# Patient Record
Sex: Female | Born: 1950 | Race: Black or African American | Hispanic: No | Marital: Single | Smoking: Former smoker
Health system: Southern US, Community
[De-identification: ages and names within clinical notes are randomized; demographics above are authoritative.]

## PROBLEM LIST (undated history)

## (undated) DIAGNOSIS — F32A Depression, unspecified: Secondary | ICD-10-CM

## (undated) DIAGNOSIS — Z9221 Personal history of antineoplastic chemotherapy: Secondary | ICD-10-CM

## (undated) DIAGNOSIS — I10 Essential (primary) hypertension: Secondary | ICD-10-CM

## (undated) DIAGNOSIS — IMO0001 Reserved for inherently not codable concepts without codable children: Secondary | ICD-10-CM

## (undated) DIAGNOSIS — K219 Gastro-esophageal reflux disease without esophagitis: Secondary | ICD-10-CM

## (undated) DIAGNOSIS — C50411 Malignant neoplasm of upper-outer quadrant of right female breast: Secondary | ICD-10-CM

## (undated) DIAGNOSIS — F329 Major depressive disorder, single episode, unspecified: Secondary | ICD-10-CM

## (undated) DIAGNOSIS — M1990 Unspecified osteoarthritis, unspecified site: Secondary | ICD-10-CM

## (undated) DIAGNOSIS — F419 Anxiety disorder, unspecified: Secondary | ICD-10-CM

## (undated) DIAGNOSIS — IMO0002 Reserved for concepts with insufficient information to code with codable children: Secondary | ICD-10-CM

## (undated) DIAGNOSIS — Z923 Personal history of irradiation: Secondary | ICD-10-CM

## (undated) DIAGNOSIS — M24319 Pathological dislocation of unspecified shoulder, not elsewhere classified: Secondary | ICD-10-CM

## (undated) DIAGNOSIS — Z8719 Personal history of other diseases of the digestive system: Secondary | ICD-10-CM

## (undated) DIAGNOSIS — C50919 Malignant neoplasm of unspecified site of unspecified female breast: Secondary | ICD-10-CM

## (undated) HISTORY — DX: Essential (primary) hypertension: I10

## (undated) HISTORY — DX: Malignant neoplasm of upper-outer quadrant of right female breast: C50.411

## (undated) HISTORY — PX: MULTIPLE TOOTH EXTRACTIONS: SHX2053

## (undated) HISTORY — DX: Reserved for inherently not codable concepts without codable children: IMO0001

## (undated) HISTORY — DX: Anxiety disorder, unspecified: F41.9

## (undated) HISTORY — DX: Reserved for concepts with insufficient information to code with codable children: IMO0002

## (undated) HISTORY — PX: WISDOM TOOTH EXTRACTION: SHX21

## (undated) HISTORY — DX: Depression, unspecified: F32.A

## (undated) HISTORY — PX: TONSILLECTOMY: SUR1361

## (undated) HISTORY — DX: Major depressive disorder, single episode, unspecified: F32.9

---

## 1988-11-29 HISTORY — PX: ABDOMINAL HYSTERECTOMY: SHX81

## 2001-07-03 ENCOUNTER — Emergency Department (HOSPITAL_COMMUNITY): Admission: EM | Admit: 2001-07-03 | Discharge: 2001-07-03 | Payer: Self-pay | Admitting: Emergency Medicine

## 2001-07-03 ENCOUNTER — Encounter: Payer: Self-pay | Admitting: Emergency Medicine

## 2002-02-23 ENCOUNTER — Emergency Department (HOSPITAL_COMMUNITY): Admission: EM | Admit: 2002-02-23 | Discharge: 2002-02-23 | Payer: Self-pay | Admitting: Emergency Medicine

## 2003-06-01 ENCOUNTER — Emergency Department (HOSPITAL_COMMUNITY): Admission: AD | Admit: 2003-06-01 | Discharge: 2003-06-01 | Payer: Self-pay | Admitting: Family Medicine

## 2007-07-03 ENCOUNTER — Emergency Department (HOSPITAL_COMMUNITY): Admission: EM | Admit: 2007-07-03 | Discharge: 2007-07-03 | Payer: Self-pay | Admitting: Family Medicine

## 2007-08-08 ENCOUNTER — Emergency Department (HOSPITAL_COMMUNITY): Admission: EM | Admit: 2007-08-08 | Discharge: 2007-08-08 | Payer: Self-pay | Admitting: Family Medicine

## 2007-08-27 ENCOUNTER — Emergency Department (HOSPITAL_COMMUNITY): Admission: EM | Admit: 2007-08-27 | Discharge: 2007-08-27 | Payer: Self-pay | Admitting: Family Medicine

## 2008-08-06 ENCOUNTER — Emergency Department (HOSPITAL_COMMUNITY): Admission: EM | Admit: 2008-08-06 | Discharge: 2008-08-06 | Payer: Self-pay | Admitting: Emergency Medicine

## 2013-02-02 ENCOUNTER — Other Ambulatory Visit: Payer: Self-pay

## 2013-02-28 HISTORY — PX: BREAST BIOPSY: SHX20

## 2013-03-02 ENCOUNTER — Encounter: Payer: Self-pay | Admitting: Obstetrics & Gynecology

## 2013-03-02 ENCOUNTER — Other Ambulatory Visit: Payer: Self-pay | Admitting: Obstetrics & Gynecology

## 2013-03-02 ENCOUNTER — Ambulatory Visit (INDEPENDENT_AMBULATORY_CARE_PROVIDER_SITE_OTHER): Payer: 59 | Admitting: Obstetrics & Gynecology

## 2013-03-02 VITALS — BP 157/66 | HR 69 | Temp 98.1°F | Ht 60.0 in | Wt 220.0 lb

## 2013-03-02 DIAGNOSIS — N63 Unspecified lump in unspecified breast: Secondary | ICD-10-CM

## 2013-03-02 DIAGNOSIS — Z0000 Encounter for general adult medical examination without abnormal findings: Secondary | ICD-10-CM

## 2013-03-02 DIAGNOSIS — Z01419 Encounter for gynecological examination (general) (routine) without abnormal findings: Secondary | ICD-10-CM

## 2013-03-02 DIAGNOSIS — R234 Changes in skin texture: Secondary | ICD-10-CM

## 2013-03-02 LAB — COMPREHENSIVE METABOLIC PANEL
ALT: 25 U/L (ref 0–35)
AST: 18 U/L (ref 0–37)
Albumin: 4.2 g/dL (ref 3.5–5.2)
Alkaline Phosphatase: 81 U/L (ref 39–117)
BUN: 11 mg/dL (ref 6–23)
CO2: 27 mEq/L (ref 19–32)
Calcium: 9.4 mg/dL (ref 8.4–10.5)
Chloride: 99 mEq/L (ref 96–112)
Creat: 0.75 mg/dL (ref 0.50–1.10)
Glucose, Bld: 81 mg/dL (ref 70–99)
Potassium: 3.9 mEq/L (ref 3.5–5.3)
Sodium: 141 mEq/L (ref 135–145)
Total Bilirubin: 0.4 mg/dL (ref 0.3–1.2)
Total Protein: 8.5 g/dL — ABNORMAL HIGH (ref 6.0–8.3)

## 2013-03-02 LAB — POCT URINALYSIS DIPSTICK
Bilirubin, UA: NEGATIVE
Glucose, UA: NEGATIVE
Ketones, UA: NEGATIVE
Nitrite, UA: NEGATIVE
Protein, UA: NEGATIVE
Spec Grav, UA: <=1.005
Urobilinogen, UA: NEGATIVE
pH, UA: 8

## 2013-03-02 LAB — CBC
HCT: 43.5 % (ref 36.0–46.0)
Hemoglobin: 14.6 g/dL (ref 12.0–15.0)
MCH: 28 pg (ref 26.0–34.0)
MCHC: 33.6 g/dL (ref 30.0–36.0)
MCV: 83.3 fL (ref 78.0–100.0)
Platelets: 379 10*3/uL (ref 150–400)
RBC: 5.22 MIL/uL — ABNORMAL HIGH (ref 3.87–5.11)
RDW: 15.8 % — ABNORMAL HIGH (ref 11.5–15.5)
WBC: 7.6 10*3/uL (ref 4.0–10.5)

## 2013-03-02 LAB — HDL CHOLESTEROL: HDL: 56 mg/dL (ref >39)

## 2013-03-02 LAB — CHOLESTEROL, TOTAL: Cholesterol: 236 mg/dL — ABNORMAL HIGH (ref 0–200)

## 2013-03-02 LAB — TSH: TSH: 2.232 u[IU]/mL (ref 0.350–4.500)

## 2013-03-02 NOTE — Progress Notes (Signed)
Subjective:     Denise Becker is a 62 y.o. female here for a routine exam.  Current complaints: itching to chest area, would like to schedule a mammogram.  Personal health questionnaire reviewed: yes.   Gynecologic History No LMP recorded. Patient has had a hysterectomy. Contraception: status post hysterectomy Last Pap: 15 - 20 years ago. Last mammogram: Never had.  Obstetric History OB History  No data available     The following portions of the patient's history were reviewed and updated as appropriate: allergies, current medications, past family history, past medical history, past social history, past surgical history and problem list.  Review of Systems Pertinent items are noted in HPI.    Objective:    General appearance: alert Breasts: left breast with inverted nipple; no masses, no discharge Abdomen: soft, non-tender; bowel sounds normal; no masses,  no organomegaly Pelvic:  external genitalia normal, no adnexal masses or tenderness, and vagina normal without discharge    Assessment:   Possible breast mass   Plan:   Orders Placed This Encounter  Procedures  . Hemoglobin A1c  . TSH  . CBC  . Comprehensive metabolic panel  . Cholesterol, total  . HDL cholesterol  . POCT Urinalysis Dipstick  Referral-->breast imaging, primary care provider Follow-up prn

## 2013-03-02 NOTE — Patient Instructions (Signed)
Health Maintenance, Female A healthy lifestyle and preventative care can promote health and wellness.  Maintain regular health, dental, and eye exams.  Eat a healthy diet. Foods like vegetables, fruits, whole grains, low-fat dairy products, and lean protein foods contain the nutrients you need without too many calories. Decrease your intake of foods high in solid fats, added sugars, and salt. Get information about a proper diet from your caregiver, if necessary.  Regular physical exercise is one of the most important things you can do for your health. Most adults should get at least 150 minutes of moderate-intensity exercise (any activity that increases your heart rate and causes you to sweat) each week. In addition, most adults need muscle-strengthening exercises on 2 or more days a week.   Maintain a healthy weight. The body mass index (BMI) is a screening tool to identify possible weight problems. It provides an estimate of body fat based on height and weight. Your caregiver can help determine your BMI, and can help you achieve or maintain a healthy weight. For adults 20 years and older:  A BMI below 18.5 is considered underweight.  A BMI of 18.5 to 24.9 is normal.  A BMI of 25 to 29.9 is considered overweight.  A BMI of 30 and above is considered obese.  Maintain normal blood lipids and cholesterol by exercising and minimizing your intake of saturated fat. Eat a balanced diet with plenty of fruits and vegetables. Blood tests for lipids and cholesterol should begin at age 26 and be repeated every 5 years. If your lipid or cholesterol levels are high, you are over 50, or you are a high risk for heart disease, you may need your cholesterol levels checked more frequently.Ongoing high lipid and cholesterol levels should be treated with medicines if diet and exercise are not effective.  If you smoke, find out from your caregiver how to quit. If you do not use tobacco, do not start.  Lung  cancer screening is recommended for adults aged 63 80 years who are at high risk for developing lung cancer because of a history of smoking. Yearly low-dose computed tomography (CT) is recommended for people who have at least a 30-pack-year history of smoking and are a current smoker or have quit within the past 15 years. A pack year of smoking is smoking an average of 1 pack of cigarettes a day for 1 year (for example: 1 pack a day for 30 years or 2 packs a day for 15 years). Yearly screening should continue until the smoker has stopped smoking for at least 15 years. Yearly screening should also be stopped for people who develop a health problem that would prevent them from having lung cancer treatment.  If you are pregnant, do not drink alcohol. If you are breastfeeding, be very cautious about drinking alcohol. If you are not pregnant and choose to drink alcohol, do not exceed 1 drink per day. One drink is considered to be 12 ounces (355 mL) of beer, 5 ounces (148 mL) of wine, or 1.5 ounces (44 mL) of liquor.  Avoid use of street drugs. Do not share needles with anyone. Ask for help if you need support or instructions about stopping the use of drugs.  High blood pressure causes heart disease and increases the risk of stroke. Blood pressure should be checked at least every 1 to 2 years. Ongoing high blood pressure should be treated with medicines, if weight loss and exercise are not effective.  If you are 55 to  63 years old, ask your caregiver if you should take aspirin to prevent strokes.  Diabetes screening involves taking a blood sample to check your fasting blood sugar level. This should be done once every 3 years, after age 45, if you are within normal weight and without risk factors for diabetes. Testing should be considered at a younger age or be carried out more frequently if you are overweight and have at least 1 risk factor for diabetes.  Breast cancer screening is essential preventative care  for women. You should practice "breast self-awareness." This means understanding the normal appearance and feel of your breasts and may include breast self-examination. Any changes detected, no matter how small, should be reported to a caregiver. Women in their 20s and 30s should have a clinical breast exam (CBE) by a caregiver as part of a regular health exam every 1 to 3 years. After age 40, women should have a CBE every year. Starting at age 40, women should consider having a mammogram (breast X-ray) every year. Women who have a family history of breast cancer should talk to their caregiver about genetic screening. Women at a high risk of breast cancer should talk to their caregiver about having an MRI and a mammogram every year.  Breast cancer gene (BRCA)-related cancer risk assessment is recommended for women who have family members with BRCA-related cancers. BRCA-related cancers include breast, ovarian, tubal, and peritoneal cancers. Having family members with these cancers may be associated with an increased risk for harmful changes (mutations) in the breast cancer genes BRCA1 and BRCA2. Results of the assessment will determine the need for genetic counseling and BRCA1 and BRCA2 testing.  The Pap test is a screening test for cervical cancer. Women should have a Pap test starting at age 21. Between ages 21 and 29, Pap tests should be repeated every 2 years. Beginning at age 30, you should have a Pap test every 3 years as long as the past 3 Pap tests have been normal. If you had a hysterectomy for a problem that was not cancer or a condition that could lead to cancer, then you no longer need Pap tests. If you are between ages 65 and 70, and you have had normal Pap tests going back 10 years, you no longer need Pap tests. If you have had past treatment for cervical cancer or a condition that could lead to cancer, you need Pap tests and screening for cancer for at least 20 years after your treatment. If Pap  tests have been discontinued, risk factors (such as a new sexual partner) need to be reassessed to determine if screening should be resumed. Some women have medical problems that increase the chance of getting cervical cancer. In these cases, your caregiver may recommend more frequent screening and Pap tests.  The human papillomavirus (HPV) test is an additional test that may be used for cervical cancer screening. The HPV test looks for the virus that can cause the cell changes on the cervix. The cells collected during the Pap test can be tested for HPV. The HPV test could be used to screen women aged 30 years and older, and should be used in women of any age who have unclear Pap test results. After the age of 30, women should have HPV testing at the same frequency as a Pap test.  Colorectal cancer can be detected and often prevented. Most routine colorectal cancer screening begins at the age of 50 and continues through age 75. However, your caregiver   may recommend screening at an earlier age if you have risk factors for colon cancer. On a yearly basis, your caregiver may provide home test kits to check for hidden blood in the stool. Use of a small camera at the end of a tube, to directly examine the colon (sigmoidoscopy or colonoscopy), can detect the earliest forms of colorectal cancer. Talk to your caregiver about this at age 23, when routine screening begins. Direct examination of the colon should be repeated every 5 to 10 years through age 81, unless early forms of pre-cancerous polyps or small growths are found.  Hepatitis C blood testing is recommended for all people born from [birth year] through 1966 and any individual with known risks for hepatitis C.  Practice safe sex. Use condoms and avoid high-risk sexual practices to reduce the spread of sexually transmitted infections (STIs). Sexually active women aged 17 and younger should be checked for Chlamydia, which is a common sexually transmitted infection.  Older women with new or multiple partners should also be tested for Chlamydia. Testing for other STIs is recommended if you are sexually active and at increased risk.  Osteoporosis is a disease in which the bones lose minerals and strength with aging. This can result in serious bone fractures. The risk of osteoporosis can be identified using a bone density scan. Women ages 45 and over and women at risk for fractures or osteoporosis should discuss screening with their caregivers. Ask your caregiver whether you should be taking a calcium supplement or vitamin D to reduce the rate of osteoporosis.  Menopause can be associated with physical symptoms and risks. Hormone replacement therapy is available to decrease symptoms and risks. You should talk to your caregiver about whether hormone replacement therapy is right for you.  Use sunscreen. Apply sunscreen liberally and repeatedly throughout the day. You should seek shade when your shadow is shorter than you. Protect yourself by wearing long sleeves, pants, a wide-brimmed hat, and sunglasses year round, whenever you are outdoors.  Notify your caregiver of new moles or changes in moles, especially if there is a change in shape or color. Also notify your caregiver if a mole is larger than the size of a pencil eraser.  Stay current with your immunizations. Document Released: 09/30/2010 Document Revised: 07/12/2012 Document Reviewed: 09/30/2010 [facility] Patient Information 2014 [place], [place]. Mammography Mammography is an X-ray of the breasts to look for changes that are not normal. The X-ray image is called a mammogram. This procedure can screen for breast cancer, can detect cancer early, and can diagnose cancer.  LET YOUR CAREGIVER KNOW ABOUT:  Breast implants.  Previous breast disease, biopsy, or surgery.  If you are breastfeeding.  Medicines taken, including vitamins, herbs, eyedrops, over-the-counter medicines, and creams.  Use of steroids (by  mouth or creams).  Possibility of pregnancy, if this applies. RISKS AND COMPLICATIONS  Exposure to radiation, but at very low levels.  The results may be misinterpreted.  The results may not be accurate.  Mammography may lead to further tests.  Mammography may not catch certain cancers. BEFORE THE PROCEDURE  Schedule your test about 7 days after your menstrual period. This is when your breasts are the least tender and have signs of hormone changes.  If you have had a mammography done at a different facility in the past, get the mammogram X-rays or have them sent to your current exam facility in order to compare them.  Wash your breasts and under your arms the day of the test.  Do not wear deodorants, perfumes, or powders anywhere on your body.  Wear clothes that you can change in and out of easily. PROCEDURE Relax as much as possible during the test. Any discomfort during the test will be very brief. The test should take less than 30 minutes. The following will happen:  You will undress from the waist up and put on a gown.  You will stand in front of the X-ray machine.  Each breast will be placed between 2 plastic or glass plates. The plates will compress your breast for a few seconds.  X-rays will be taken from different angles of the breast. AFTER THE PROCEDURE  The mammogram will be examined.  Depending on the quality of the images, you may need to repeat certain parts of the test.  Ask when your test results will be ready. Make sure you get your test results.  You may resume normal activities. Document Released: 03/14/2000 Document Revised: 06/09/2011 Document Reviewed: 01/05/2011 [facility] Patient Information 2014 [place], [place].

## 2013-03-02 NOTE — Progress Notes (Signed)
   Subjective:    Patient ID: Denise Becker, female    DOB: October 27, 1950, 62 y.o.   MRN: [id number]  Gynecologic Exam      Review of Systems     Objective:   Physical Exam  Pulmonary/Chest:            Assessment & Plan:

## 2013-03-03 ENCOUNTER — Ambulatory Visit
Admission: RE | Admit: 2013-03-03 | Discharge: 2013-03-03 | Disposition: A | Discharge status: Home / Self Care | Payer: 59 | Source: Ambulatory Visit | Attending: Obstetrics & Gynecology | Admitting: Obstetrics & Gynecology

## 2013-03-03 ENCOUNTER — Other Ambulatory Visit: Payer: Self-pay | Admitting: Obstetrics & Gynecology

## 2013-03-03 DIAGNOSIS — N63 Unspecified lump in unspecified breast: Secondary | ICD-10-CM

## 2013-03-03 DIAGNOSIS — R234 Changes in skin texture: Secondary | ICD-10-CM

## 2013-03-03 LAB — HEMOGLOBIN A1C
Hgb A1c MFr Bld: 6.3 % — ABNORMAL HIGH (ref <5.7)
Mean Plasma Glucose: 134 mg/dL — ABNORMAL HIGH (ref <117)

## 2013-03-04 ENCOUNTER — Ambulatory Visit
Admission: RE | Admit: 2013-03-04 | Discharge: 2013-03-04 | Disposition: A | Discharge status: Home / Self Care | Payer: 59 | Source: Ambulatory Visit | Attending: Obstetrics & Gynecology | Admitting: Obstetrics & Gynecology

## 2013-03-04 ENCOUNTER — Other Ambulatory Visit: Payer: Self-pay | Admitting: Obstetrics & Gynecology

## 2013-03-04 DIAGNOSIS — R234 Changes in skin texture: Secondary | ICD-10-CM

## 2013-03-04 DIAGNOSIS — N63 Unspecified lump in unspecified breast: Secondary | ICD-10-CM

## 2013-03-07 ENCOUNTER — Encounter: Payer: Self-pay | Admitting: Obstetrics & Gynecology

## 2013-03-07 ENCOUNTER — Other Ambulatory Visit: Payer: Self-pay | Admitting: Obstetrics & Gynecology

## 2013-03-10 ENCOUNTER — Telehealth: Payer: Self-pay | Admitting: *Deleted

## 2013-03-10 ENCOUNTER — Encounter: Payer: Self-pay | Admitting: *Deleted

## 2013-03-10 DIAGNOSIS — C50411 Malignant neoplasm of upper-outer quadrant of right female breast: Secondary | ICD-10-CM

## 2013-03-10 HISTORY — DX: Malignant neoplasm of upper-outer quadrant of right female breast: C50.411

## 2013-03-10 NOTE — Telephone Encounter (Signed)
Confirmed [facility] for 03/16/13 at 0800 .  Instructions and contact information given. 

## 2013-03-11 ENCOUNTER — Ambulatory Visit
Admission: RE | Admit: 2013-03-11 | Discharge: 2013-03-11 | Disposition: A | Discharge status: Home / Self Care | Payer: 59 | Source: Ambulatory Visit | Attending: Obstetrics & Gynecology | Admitting: Obstetrics & Gynecology

## 2013-03-11 MED ORDER — GADOBENATE DIMEGLUMINE 529 MG/ML IV SOLN
20.0000 mL | Freq: Once | INTRAVENOUS | Status: AC | PRN
  Administered 2013-03-11: 20 mL via INTRAVENOUS

## 2013-03-14 ENCOUNTER — Other Ambulatory Visit: Payer: Self-pay | Admitting: Obstetrics & Gynecology

## 2013-03-16 ENCOUNTER — Encounter: Payer: Self-pay | Admitting: *Deleted

## 2013-03-16 ENCOUNTER — Other Ambulatory Visit (HOSPITAL_BASED_OUTPATIENT_CLINIC_OR_DEPARTMENT_OTHER): Payer: 59

## 2013-03-16 ENCOUNTER — Ambulatory Visit (HOSPITAL_BASED_OUTPATIENT_CLINIC_OR_DEPARTMENT_OTHER): Payer: 59 | Admitting: General Surgery

## 2013-03-16 ENCOUNTER — Encounter (INDEPENDENT_AMBULATORY_CARE_PROVIDER_SITE_OTHER): Payer: Self-pay | Admitting: General Surgery

## 2013-03-16 ENCOUNTER — Ambulatory Visit: Payer: 59 | Attending: General Surgery | Admitting: Physical Therapy

## 2013-03-16 ENCOUNTER — Ambulatory Visit: Payer: 59

## 2013-03-16 ENCOUNTER — Telehealth: Payer: Self-pay | Admitting: *Deleted

## 2013-03-16 ENCOUNTER — Encounter: Payer: Self-pay | Admitting: Oncology

## 2013-03-16 ENCOUNTER — Ambulatory Visit
Admission: RE | Admit: 2013-03-16 | Discharge: 2013-03-16 | Disposition: A | Discharge status: Home / Self Care | Payer: 59 | Source: Ambulatory Visit | Attending: Radiation Oncology | Admitting: Radiation Oncology

## 2013-03-16 ENCOUNTER — Ambulatory Visit (HOSPITAL_BASED_OUTPATIENT_CLINIC_OR_DEPARTMENT_OTHER): Payer: 59 | Admitting: Oncology

## 2013-03-16 ENCOUNTER — Telehealth: Payer: Self-pay | Admitting: Oncology

## 2013-03-16 VITALS — BP 172/83 | HR 84 | Temp 98.3°F | Resp 18 | Ht 60.0 in | Wt 214.4 lb

## 2013-03-16 DIAGNOSIS — C50411 Malignant neoplasm of upper-outer quadrant of right female breast: Secondary | ICD-10-CM

## 2013-03-16 DIAGNOSIS — C50919 Malignant neoplasm of unspecified site of unspecified female breast: Secondary | ICD-10-CM

## 2013-03-16 DIAGNOSIS — IMO0001 Reserved for inherently not codable concepts without codable children: Secondary | ICD-10-CM | POA: Insufficient documentation

## 2013-03-16 DIAGNOSIS — C50419 Malignant neoplasm of upper-outer quadrant of unspecified female breast: Secondary | ICD-10-CM

## 2013-03-16 DIAGNOSIS — I10 Essential (primary) hypertension: Secondary | ICD-10-CM

## 2013-03-16 DIAGNOSIS — F4323 Adjustment disorder with mixed anxiety and depressed mood: Secondary | ICD-10-CM

## 2013-03-16 DIAGNOSIS — R293 Abnormal posture: Secondary | ICD-10-CM | POA: Insufficient documentation

## 2013-03-16 LAB — COMPREHENSIVE METABOLIC PANEL (CC13)
AST: 15 U/L (ref 5–34)
Albumin: 3.6 g/dL (ref 3.5–5.0)
Alkaline Phosphatase: 80 U/L (ref 40–150)
BUN: 12 mg/dL (ref 7.0–26.0)
Calcium: 10 mg/dL (ref 8.4–10.4)
Chloride: 106 mEq/L (ref 98–109)
Glucose: 106 mg/dl (ref 70–140)
Potassium: 3.8 mEq/L (ref 3.5–5.1)
Sodium: 141 mEq/L (ref 136–145)
Total Protein: 8.4 g/dL — ABNORMAL HIGH (ref 6.4–8.3)

## 2013-03-16 LAB — CBC WITH DIFFERENTIAL/PLATELET
BASO%: 0.9 % (ref 0.0–2.0)
Basophils Absolute: 0.1 10*3/uL (ref 0.0–0.1)
Eosinophils Absolute: 0.2 10*3/uL (ref 0.0–0.5)
HGB: 14.6 g/dL (ref 11.6–15.9)
MCH: 27.8 pg (ref 25.1–34.0)
MONO%: 10.6 % (ref 0.0–14.0)
NEUT#: 3.4 10*3/uL (ref 1.5–6.5)
RBC: 5.25 10*6/uL (ref 3.70–5.45)
RDW: 14.9 % — ABNORMAL HIGH (ref 11.2–14.5)
WBC: 7.3 10*3/uL (ref 3.9–10.3)
lymph#: 2.9 10*3/uL (ref 0.9–3.3)

## 2013-03-16 NOTE — Progress Notes (Signed)
Radiation Oncology         [id number]) [phone] ________________________________  Initial outpatient Consultation - Date: 03/16/2013   Name: Denise Becker MRN: [id number]   DOB: November 06, 1950  REFERRING PHYSICIAN: Ernestene Mention, MD  DIAGNOSIS: T3N0 Right breast cancer  HISTORY OF PRESENT ILLNESS::Denise Becker is a 62 y.o. female  noted nipple retraction about 3 months ago. She presented to her primary care physician's office ordered a mammogram. The mammogram showed a 6 cm mass with nipple retraction. On ultrasound as noted to measure 6.7 cm with 2 enlarged axillary lymph nodes. An MRI of the bilateral breasts showed a 4.1 x 6.8 x 4.9 cm area with several satellite nodules. 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 This is ER positive at 100% PR positive at 100% Ki-67 20% and HER-2 negative. A biopsy of the axillary lymph node showed adequate lymphoid tissue with no evidence of malignancy. She presents to multidisciplinary clinic today unaccompanied. He is GX P1 with menses at 10 and her last period 15 years ago. First childbirth was at 89. She is not on hormone replacement.Marland Kitchen  PREVIOUS RADIATION THERAPY: No  PAST MEDICAL HISTORY:  has a past medical history of Anxiety; Depression; Breast cancer of upper-outer quadrant of right female breast (03/10/2013); and Hypertension.    PAST SURGICAL HISTORY: Past Surgical History  Procedure Laterality Date  . Abdominal hysterectomy    . Multiple tooth extractions Bilateral     FAMILY HISTORY:  Family History  Problem Relation Age of Onset  . Cancer Mother   . Hypertension Mother   . Bowel Disease Mother   . Lung disease Father   . Heart disease Maternal Grandmother   . Heart disease Maternal Grandfather     SOCIAL HISTORY:  History  Substance Use Topics  . Smoking status: Former Smoker -- 1.00 packs/day    Types: Cigarettes  . Smokeless tobacco: Never Used  . Alcohol Use: Yes     Comment:  occasional wine    ALLERGIES: Review of patient's allergies indicates no known allergies.  MEDICATIONS:  Current Outpatient Prescriptions  Medication Sig Dispense Refill  . Amlodipine-Olmesartan (AZOR PO) Take 1 tablet by mouth daily.      . Multiple Vitamin (MULTIVITAMIN) capsule Take 1 capsule by mouth daily.      . naproxen sodium (ANAPROX) 220 MG tablet Take 220 mg by mouth 2 (two) times daily as needed.       No current facility-administered medications for this encounter.    REVIEW OF SYSTEMS:  A 15 point review of systems is documented in the electronic medical record. This was obtained by the nursing staff. However, I reviewed this with the patient to discuss relevant findings and make appropriate changes.  Pertinent items are noted in HPI.  PHYSICAL EXAM: There were no vitals filed for this visit.. . Pleasant female who appears her stated age sitting comfortably on examining room chair. She has been nipple retraction of the right breast. She has a large palpable mass in the upper outer to lower outer quadrant. The margins are difficult to delineate. There is some skin dimpling in that area as well. No palpable abnormalities the left breast. No palpable left axillary lymph nodes. No palpable cervical or supraclavicular lymph nodes bilaterally. She is alert and oriented x3. She is 5 out of 5 strength bilaterally.  LABORATORY DATA:  Lab Results  Component Value Date   WBC 7.3 03/16/2013   HGB 14.6 03/16/2013  HCT 43.9 03/16/2013   MCV 83.7 03/16/2013   PLT 305 03/16/2013   Lab Results  Component Value Date   NA 141 03/16/2013   K 3.8 03/16/2013   CL 99 03/02/2013   CO2 26 03/16/2013   Lab Results  Component Value Date   ALT 21 03/16/2013   AST 15 03/16/2013   ALKPHOS 80 03/16/2013   BILITOT 0.37 03/16/2013     RADIOGRAPHY: US Breast Right  03/03/2013   CLINICAL DATA:  62 year old patient with palpable mass in the superior right breast and right nipple retraction.  This is her 1st mammogram.  EXAM: DIGITAL DIAGNOSTIC  BILATERAL MAMMOGRAM WITH CAD  ULTRASOUND RIGHT BREAST  COMPARISON:  None.  ACR Breast Density Category c: The breast tissue is heterogeneously dense, which may obscure small masses.  FINDINGS: There is a prominent irregular mass like area of added density in the upper central right breast that measures approximately 5 x 5 x 3 cm, with slight distortion and associated right nipple retraction. No skin thickening is appreciated on the right. No suspicious microcalcifications are identified in the right breast.  No mass, suspicious microcalcification, or distortion is identified on the left to suggest malignancy.  Mammographic images were processed with CAD.  On physical exam, there is a firm palpable mass in the superior right breast that extends from approximately 1 o'clock position to 9:30 position. The right nipple is completely retracted. No definite skin thickening is identified. No mass is palpated in the right axilla.  Ultrasound is performed, showing a prominent irregular hypoechoic mass that causes marked posterior acoustic shadowing. This mass is greater than discuss that this mass is greater in size than the ultrasound screen, and is best measured bile mammogram and physical examination. Ultrasound abnormality spans from the 1 o'clock position to the 9:30 position in the superior right breast.  Ultrasound of the right axilla shows 2 adjacent small lymph nodes, both of which have diffusely thickened cortices. The largest of these 2 lymph nodes measures 1.1 cm x 0.7 cm.  IMPRESSION: Findings highly suspicious for malignancy in the superior right breast. Two right axillary lymph nodes demonstrate diffuse cortical thickening suspicious for metastatic disease. Findings were discussed with the patient today, and biopsy of the right breast mass and one of the lymph nodes is suggested. It was offered to the patient to perform the biopsies today, but she would  prefer to return tomorrow. Biopsy has been scheduled for 03/04/2013.  RECOMMENDATION: Ultrasound-guided core needle biopsy of palpable right breast mass and a right axillary lymph node with diffuse cortical thickening.  I have discussed the findings and recommendations with the patient. Results were also provided in writing at the conclusion of the visit.  BI-RADS CATEGORY  5: Highly suggestive of malignancy - appropriate action should be taken.   Electronically Signed   By: Britta Mccreedy M.D.   On: 03/03/2013 14:58   Mr Breast Bilateral W Wo Contrast  03/11/2013   CLINICAL DATA:  62 year old female with recently diagnosed invasive ductal carcinoma of the right breast. A biopsied right axillary lymph node was negative for metastases.  EXAM: BILATERAL BREAST MRI WITH AND WITHOUT CONTRAST  LABS:  Most recent serum creatinine was 0.8 mg/dL on 40/98/1191.  TECHNIQUE: Multiplanar, multisequence MR images of both breasts were obtained prior to and following the intravenous administration of 20ml of MultiHance.  THREE-DIMENSIONAL MR IMAGE RENDERING ON INDEPENDENT WORKSTATION:  Three-dimensional MR images were rendered by post-processing of the original MR data on an independent  workstation. The three-dimensional MR images were interpreted, and findings are reported in the following complete MRI report for this study. Three dimensional images were evaluated at the independent DynaCad workstation  COMPARISON:  Previous exams  FINDINGS: Breast composition: c:  Heterogeneous fibroglandular tissue  Background parenchymal enhancement: Moderate  Right breast: Irregular enhancing mass in the central to upper right breast with resultant nipple retraction/tethering measures approximately 4.1 cm AP, 6.8 cm transverse, and 4.9 cm craniocaudal. Several small adjacent enhancing nodules are present compatible with satellite nodules, with the type furthest enhancing nodule approximately 3 cm medial to the dominant mass measuring  approximately 6 mm. There is no abnormal skin enhancement seen. No involvement of the pectoralis muscle.  Left breast: No suspicious rapidly enhancing masses or abnormal areas of enhancement in the left breast to suggest malignancy.  Lymph nodes: No definite morphologically abnormal right axillary lymph nodes are seen, with a low right axillary lymph node previously biopsied with benign pathology. Small right interpectoral lymph nodes are also seen, all of which appear to contain fatty hila. No definite internal mammary lymphadenopathy.  Ancillary findings:  None.  IMPRESSION: 1. Biopsy proven malignancy involving the central to upper right breast measuring up to 6.8 cm with several surrounding satellite nodules, and resultant retraction/tethering of the nipple.  2. No morphologically abnormal right axillary lymph nodes seen (with a low right axillary node previously biopsied with benign pathology).  3.  No MRI evidence of malignancy in the left breast.  RECOMMENDATION: Treatment plan for known right breast malignancy.  BI-RADS CATEGORY  6: Known biopsy-proven malignancy - appropriate action should be taken.   Electronically Signed   By: Edwin Cap M.D.   On: 03/11/2013 11:48   Mm Digital Diagnostic Bilat  03/03/2013   CLINICAL DATA:  62 year old patient with palpable mass in the superior right breast and right nipple retraction. This is her 1st mammogram.  EXAM: DIGITAL DIAGNOSTIC  BILATERAL MAMMOGRAM WITH CAD  ULTRASOUND RIGHT BREAST  COMPARISON:  None.  ACR Breast Density Category c: The breast tissue is heterogeneously dense, which may obscure small masses.  FINDINGS: There is a prominent irregular mass like area of added density in the upper central right breast that measures approximately 5 x 5 x 3 cm, with slight distortion and associated right nipple retraction. No skin thickening is appreciated on the right. No suspicious microcalcifications are identified in the right breast.  No mass, suspicious  microcalcification, or distortion is identified on the left to suggest malignancy.  Mammographic images were processed with CAD.  On physical exam, there is a firm palpable mass in the superior right breast that extends from approximately 1 o'clock position to 9:30 position. The right nipple is completely retracted. No definite skin thickening is identified. No mass is palpated in the right axilla.  Ultrasound is performed, showing a prominent irregular hypoechoic mass that causes marked posterior acoustic shadowing. This mass is greater than discuss that this mass is greater in size than the ultrasound screen, and is best measured bile mammogram and physical examination. Ultrasound abnormality spans from the 1 o'clock position to the 9:30 position in the superior right breast.  Ultrasound of the right axilla shows 2 adjacent small lymph nodes, both of which have diffusely thickened cortices. The largest of these 2 lymph nodes measures 1.1 cm x 0.7 cm.  IMPRESSION: Findings highly suspicious for malignancy in the superior right breast. Two right axillary lymph nodes demonstrate diffuse cortical thickening suspicious for metastatic disease. Findings were discussed with  the patient today, and biopsy of the right breast mass and one of the lymph nodes is suggested. It was offered to the patient to perform the biopsies today, but she would prefer to return tomorrow. Biopsy has been scheduled for 03/04/2013.  RECOMMENDATION: Ultrasound-guided core needle biopsy of palpable right breast mass and a right axillary lymph node with diffuse cortical thickening.  I have discussed the findings and recommendations with the patient. Results were also provided in writing at the conclusion of the visit.  BI-RADS CATEGORY  5: Highly suggestive of malignancy - appropriate action should be taken.   Electronically Signed   By: Britta Mccreedy M.D.   On: 03/03/2013 14:58   Mm Digital Diagnostic Unilat R  03/04/2013   CLINICAL DATA:   Post biopsy of a highly suspicious right breast mass as well as a suspicious right axillary lymph node.  EXAM: DIAGNOSTIC RIGHT MAMMOGRAM POST ULTRASOUND BIOPSY  COMPARISON:  Previous exams  FINDINGS: Mammographic images were obtained following ultrasound guided biopsy of a highly suspicious mass in the upper-outer right breast as well as ultrasound-guided biopsy of a suspicious right axillary lymph node. The top hat shaped biopsy marking clip is in the targeted region of the right breast mass. A clip was not deployed into the right axillary lymph node.  IMPRESSION: Appropriate positioning of top hat shaped biopsy marking clip post ultrasound-guided biopsy of a highly suspicious right breast mass.  Final Assessment: Post Procedure Mammograms for Marker Placement   Electronically Signed   By: Edwin Cap M.D.   On: 03/04/2013 09:36   [name] Rt Breast Bx W Loc Dev 1st Lesion Img Bx Spec US Guide  03/07/2013   ADDENDUM REPORT: 03/07/2013 13:20  ADDENDUM: Pathology demonstrates grade 3 invasive mammary carcinoma. Biopsied lymph node is negative. These results are concordant. The patient was notified of results on 03/07/2013. She has no complaints regarding her biopsy site. Surgical consultation and breast MRI are recommended. The patient has an appointment for breast MRI on 03/11/2013 at 8 a.m. and an appointment for multidisciplinary clinic on 03/16/2013.   Electronically Signed   By: Jerene Dilling M.D.   On: 03/07/2013 13:20   03/07/2013   CLINICAL DATA:  62 year old female with a large highly suspicious mass in the upper-outer quadrant of the right breast with resultant right nipple inversion, as well as moderately suspicious right axillary lymph nodes.  EXAM: ULTRASOUND GUIDED RIGHT BREAST CORE NEEDLE BIOPSY WITH VACUUM ASSIST  COMPARISON:  Previous exams.  PROCEDURE: I met with the patient and we discussed the procedure of ultrasound-guided biopsy, including benefits and alternatives. We discussed the  high likelihood of a successful procedure. We discussed the risks of the procedure including infection, bleeding, tissue injury, clip migration, and inadequate sampling. Informed written consent was given. The usual time-out protocol was performed immediately prior to the procedure.  Using sterile technique and 2% Lidocaine as local anesthetic, under direct ultrasound visualization, a 12 gauge vacuum-assisteddevice was used to perform biopsy of the suspicious mass in the upper-outer quadrant of the right breast using a lateral approach. At the conclusion of the procedure, a top hat tissue marker clip was deployed into the biopsy cavity. Follow-up 2-view mammogram was performed and dictated separately.  Using sterile technique and 2% Lidocaine as local anesthetic, under direct ultrasound visualization, a 14 gauge Tenmodevice was used to perform biopsy of a moderately suspicious right axillary lymph node using a lateral approach.  IMPRESSION: Ultrasound-guided biopsy of a highly suspicious mass in the right  breast as well as a moderately suspicious right axillary lymph node. No apparent complications.  Electronically Signed: By: Edwin Cap M.D. On: 03/04/2013 09:34   [name] Rt Breast Bx W Loc Dev Ea Add Lesion Img Bx Spec US Guide  03/07/2013   ADDENDUM REPORT: 03/07/2013 13:20  ADDENDUM: Pathology demonstrates grade 3 invasive mammary carcinoma. Biopsied lymph node is negative. These results are concordant. The patient was notified of results on 03/07/2013. She has no complaints regarding her biopsy site. Surgical consultation and breast MRI are recommended. The patient has an appointment for breast MRI on 03/11/2013 at 8 a.m. and an appointment for multidisciplinary clinic on 03/16/2013.   Electronically Signed   By: Jerene Dilling M.D.   On: 03/07/2013 13:20   03/07/2013   CLINICAL DATA:  62 year old female with a large highly suspicious mass in the upper-outer quadrant of the right breast with resultant  right nipple inversion, as well as moderately suspicious right axillary lymph nodes.  EXAM: ULTRASOUND GUIDED RIGHT BREAST CORE NEEDLE BIOPSY WITH VACUUM ASSIST  COMPARISON:  Previous exams.  PROCEDURE: I met with the patient and we discussed the procedure of ultrasound-guided biopsy, including benefits and alternatives. We discussed the high likelihood of a successful procedure. We discussed the risks of the procedure including infection, bleeding, tissue injury, clip migration, and inadequate sampling. Informed written consent was given. The usual time-out protocol was performed immediately prior to the procedure.  Using sterile technique and 2% Lidocaine as local anesthetic, under direct ultrasound visualization, a 12 gauge vacuum-assisteddevice was used to perform biopsy of the suspicious mass in the upper-outer quadrant of the right breast using a lateral approach. At the conclusion of the procedure, a top hat tissue marker clip was deployed into the biopsy cavity. Follow-up 2-view mammogram was performed and dictated separately.  Using sterile technique and 2% Lidocaine as local anesthetic, under direct ultrasound visualization, a 14 gauge Tenmodevice was used to perform biopsy of a moderately suspicious right axillary lymph node using a lateral approach.  IMPRESSION: Ultrasound-guided biopsy of a highly suspicious mass in the right breast as well as a moderately suspicious right axillary lymph node. No apparent complications.  Electronically Signed: By: Edwin Cap M.D. On: 03/04/2013 09:34      IMPRESSION: T3 N0 right breast cancer  PLAN: Spoke with Denise Becker today. She had staging studies pending. In the absence of metastatic disease she is going to proceed forward with chemotherapy and right mastectomy and sentinel lymph node biopsy. We discussed postmastectomy radiation. We discussed the process of simulation the placement tattoos. We discussed 6 weeks of daily treatment as an outpatient. We  discussed skin darkening and fatigue as possible side effects. I will meet with her after her surgery. She has met with our surgeon, medical oncologist, navigator, physical therapist and a member of our patient family support team.  I spent 40 minutes  face to face with the patient and more than 50% of that time was spent in counseling and/or coordination of care.   ------------------------------------------------  Lurline Hare, MD

## 2013-03-16 NOTE — Progress Notes (Signed)
Checked in new patient with no financial issues. She has appt card and has not been to [place]. I gave her [facility] packet.

## 2013-03-16 NOTE — Progress Notes (Signed)
Patient ID: Denise Becker, female   DOB: [birth year], 62 y.o.   MRN: [id number]  No chief complaint on file.   HPI Denise Becker is a 62 y.o. female.  She was referred by Dr. Konrad Saha at the breast center of [facility] for evaluation and management of a locally advanced breast cancer in the right breast. Her primary care physician is Dr. Viann Shove and her gynecologist is Dr. Antionette Char. I am seeing her in the breast [facility] today with Dr. Darnelle Catalan and Dr. Michell Heinrich.  She has never had a mammogram before. She felt a mass in her right breast for the past 3 months. Denies pain or nipple discharge. She went for imaging studies after being evaluated by Dr. Tamela Oddi. Mammogram and ultrasound showed a suspicious mass in the right breast, at least 5 cm in transverse dimension with obvious nipple retraction. 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 ER. Positive at 100%, PR positive at 100%, Ki-T7 20%, HER-2 negative.  MRI shows a mass in the right breast to be 4.1 x 6.8 x 4.9 cm with adjacent nodules. They said the skin looked okay and the pectoralis muscle looked okay. The left breast and other lymph nodes looked normal.   She is somewhat tearful and emotional in the office today, but very willing to undergo discussion of treatment plans and to go forward with treatment planning.  Past history reveals TAH and BSO in the past. Anxiety and depression. Hypertension on amlodipine.  Family history reveals some type of malignancy in her mother but not breast. There is no family history of breast or ovarian cancer.  She is single. She had she had one child who is now deceased. She works as a Location manager, a moderately physical job. Denies tobacco. Drinks alcohol occasionally. HPI  Past Medical History  Diagnosis Date  . Anxiety   . Depression     history of  depression after son died  . Breast cancer of upper-outer quadrant of right female breast 03/10/2013  . Hypertension     Past Surgical History  Procedure Laterality Date  . Abdominal hysterectomy    . Multiple tooth extractions Bilateral     Family History  Problem Relation Age of Onset  . Cancer Mother   . Hypertension Mother   . Bowel Disease Mother   . Lung disease Father   . Heart disease Maternal Grandmother   . Heart disease Maternal Grandfather     Social History History  Substance Use Topics  . Smoking status: Former Smoker -- 1.00 packs/day    Types: Cigarettes  . Smokeless tobacco: Never Used  . Alcohol Use: Yes     Comment: occasional wine    No Known Allergies  Current Outpatient Prescriptions  Medication Sig Dispense Refill  . Amlodipine-Olmesartan (AZOR PO) Take 1 tablet by mouth daily.      . Multiple Vitamin (MULTIVITAMIN) capsule Take 1 capsule by mouth daily.      . naproxen sodium (ANAPROX) 220 MG tablet Take 220 mg by mouth 2 (two) times daily as needed.       No current facility-administered medications for this visit.    Review of Systems Review of Systems  Constitutional: Negative for fever, chills and unexpected weight change.  HENT: Negative for congestion, hearing loss, sore throat, trouble swallowing and voice change.   Eyes: Negative for visual disturbance.  Respiratory: Negative  for cough and wheezing.   Cardiovascular: Negative for chest pain, palpitations and leg swelling.  Gastrointestinal: Negative for nausea, vomiting, abdominal pain, diarrhea, constipation, blood in stool, abdominal distention and anal bleeding.  Genitourinary: Negative for hematuria, vaginal bleeding and difficulty urinating.  Musculoskeletal: Negative for arthralgias.  Skin: Negative for rash and wound.  Neurological: Negative for seizures, syncope and headaches.  Hematological: Negative for adenopathy. Does not bruise/bleed easily.  Psychiatric/Behavioral:  Negative for confusion. The patient is nervous/anxious.     There were no vitals taken for this visit.  Physical Exam Physical Exam  Constitutional: She is oriented to person, place, and time. She appears well-developed and well-nourished. No distress.  HENT:  Head: Normocephalic and atraumatic.  Nose: Nose normal.  Mouth/Throat: No oropharyngeal exudate.  Eyes: Conjunctivae and EOM are normal. Pupils are equal, round, and reactive to light. Left eye exhibits no discharge. No scleral icterus.  Neck: Neck supple. No JVD present. No tracheal deviation present. No thyromegaly present.  Cardiovascular: Normal rate, regular rhythm, normal heart sounds and intact distal pulses.   No murmur heard. Pulmonary/Chest: Effort normal and breath sounds normal. No respiratory distress. She has no wheezes. She has no rales. She exhibits no tenderness.    Large mass central right breast with obvious nipple retraction. The mass feels at least 7 cm transversely and at least 5 cm vertically. Other than the nipple retraction there is no obvious skin involvement. The left breast feels normal. There is no axillary adenopathy on either side.  Abdominal: Soft. Bowel sounds are normal. She exhibits no distension and no mass. There is no tenderness. There is no rebound and no guarding.  Well healed Pfannenstiel scar.  Musculoskeletal: She exhibits no edema and no tenderness.  Lymphadenopathy:    She has no cervical adenopathy.  Neurological: She is alert and oriented to person, place, and time. She exhibits normal muscle tone. Coordination normal.  Skin: Skin is warm. No rash noted. She is not diaphoretic. No erythema. No pallor.  Psychiatric: She has a normal mood and affect. Her behavior is normal. Judgment and thought content normal.    Data Reviewed I have reviewed her imaging studies and histology and breast diagnostic protocol. I have discussed her case in breast conference this morning. I have created a  treatment plan in collaboration with Dr. Darnelle Catalan and Dr. Michell Heinrich.  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 T3, N0 clinically. There may or may not be dermal involvement under the nipple. Receptor positive, HER-2-negative.  This tumor is fairly large and I think the patient would benefit from downstaging from neoadjuvant chemotherapy. She agrees with this  Status post TAH and BSO  Anxiety and depression  Hypertension     Plan    We will schedule for Port-A-Cath insertion now.  Following that, Dr. Darnelle Catalan will supervise neoadjuvant chemotherapy  Surgical plan will ultimately be right total mastectomy and sentinel lymph node biopsy. We discussed whether or not SLN biopsy should be followed with frozen section and axillary lymph node dissection. The consensus was that since she will be getting long-term antiacid therapy and radiation therapy that it would not benefit her to get axillary lymph node dissection. So we will simply states he axilla with sentinel lymph node biopsy.  I discussed the indications, details, techniques, numerous risk of Port-A-Cath insertion with her. She is aware of the risk of pneumothorax, thrombosis, catheter embolization, infection, bleeding. She understands all these issues. All of her questions are answered.  She agrees with this plan.  We did talk about mastectomy and sentinel node biopsy in detail as well.We also talked about ultimate options for plastic surgical reconstruction.       Angelia Mould. Derrell Lolling, M.D., [facility] General and Minimally invasive Surgery Breast and Colorectal Surgery Office:   [phone] Pager:   [phone]  03/16/2013, 11:03 AM

## 2013-03-16 NOTE — Progress Notes (Signed)
ID: Denise Becker OB: Aug 03, 1950  MR#: [id number]  NFA#:[id number]  PCP: Alt[place], [place] GYN:  Antionette Char SU: Claud Kelp OTHER MD: Lurline Hare  CHIEF COMPLAINT: "I found a lump in my right breast".  HISTORY OF PRESENT ILLNESS: Denise Becker palpated a change in her right breast sometime in August or September 2014. She was aware that this might be significant, but waited on meds until she saw her gynecologist for a routine visit. Dr. Tamela Oddi immediately he set her up for right mammography and ultrasonography performed 03/03/2013. Mammography found an area of density in the upper outer quadrant of the right breast measuring 5 cm in the with right nipple retraction. There was no evidence of skin thickening. On exam there was a firm palpable mass in the superior right breast extending from 1:00 to 9:00. The right nipple was completely retracted. There was no skin thickening. The right axilla was benign by palpation. Ultrasound showed a prominent irregular hypoechoic mass larger than the ultrasound screen. Ultrasound of the right axilla showed 2 adjacent small lymph nodes with diffusely thickened cortices, the largest measuring 1.1 cm.  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 There was no HER-2 amplification with a ratio of 1.46 by CISH and an average copy number of 2.85.  On 03/11/2013 the patient underwent bilateral breast MRI. This showed an irregular enhancing mass in the central to upper right breast with nipple retraction measuring in total 6.8 cm. There were several adjacent enhancing nodules as well. There was no involvement of the pectoralis muscle. There were no  definite morphologically abnormal right axillary lymph nodes there was no definite internal mammary or lymphadenopathy. Left breast was unremarkable.  The patient's subsequent history is as detailed below   INTERVAL HISTORY: Denise Becker was evaluated in the multidisciplinary breast cancer clinic 03/16/2013  REVIEW OF SYSTEMS: Aside from the right breast mass itself, there were no symptoms suggestive of breast cancer or metastasis. The patient denies unusual headaches, visual changes, nausea, vomiting, stiff neck, dizziness, or gait imbalance. There has been no cough, phlegm production, or pleurisy, no chest pain or pressure, and no change in bowel or bladder habits. Has been no fever, rash, bleeding, unexplained fatigue or unexplained weight loss. A detailed review of systems was otherwise entirely negative.  PAST MEDICAL HISTORY: Past Medical History  Diagnosis Date  . Anxiety   . Depression     history of depression after son died  . Breast cancer of upper-outer quadrant of right female breast 03/10/2013  . Hypertension     PAST SURGICAL HISTORY: Past Surgical History  Procedure Laterality Date  . Abdominal hysterectomy    . Multiple tooth extractions Bilateral     FAMILY HISTORY Family History  Problem Relation Age of Onset  . Cancer Mother   . Hypertension Mother   . Bowel Disease Mother   . Lung disease Father   . Heart disease Maternal Grandmother   . Heart disease Maternal Grandfather    the patient's mother,Denise Becker, had a lung mass but apparently died from unrelated causes (she was followed by Dr. Shirline Frees here). She was 63 years old. The patient has little information about her father. The patient had 2 brothers, no sisters. There is no history  of breast or ovarian cancer in the family to her knowledge.  GYNECOLOGIC HISTORY:  Menarche age 89, first live birth age 40, the patient is GX P1. She went through the change of life approximately 1999. She did not take  hormone replacement.  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 She is single, lives alone with her collie Ellsworth. The patient's son Denise Becker died at the age of 89 with acute leukemia. The patient has no grandchildren.    ADVANCED DIRECTIVES: Not in place, but the patient was given the appropriate documents to complete and notarize on her 03/16/2013 visit. She intends to name her cousin, Denise Becker, as her healthcare power of attorney. She can be reached at [phone]   HEALTH MAINTENANCE: History  Substance Use Topics  . Smoking status: Former Smoker -- 1.00 packs/day    Types: Cigarettes  . Smokeless tobacco: Never Used  . Alcohol Use: Yes     Comment: occasional wine     Colonoscopy:  PAP:  Bone density:  Lipid panel:  No Known Allergies  Current Outpatient Prescriptions  Medication Sig Dispense Refill  . Amlodipine-Olmesartan (AZOR PO) Take 1 tablet by mouth daily.      . Multiple Vitamin (MULTIVITAMIN) capsule Take 1 capsule by mouth daily.      . naproxen sodium (ANAPROX) 220 MG tablet Take 220 mg by mouth 2 (two) times daily as needed.       No current facility-administered medications for this visit.    OBJECTIVE: Middle-aged [place] American woman who appears stated age 62 Vitals:   03/16/13 0838  BP: 172/83  Pulse: 84  Temp: 98.3 F (36.8 C)  Resp: 18     Body mass index is 41.87 kg/(m^2).    ECOG FS:0 - Asymptomatic  Ocular: Sclerae unicteric, pupils equal, round and reactive to light Ear-nose-throat: Oropharynx clear, dentition fair Lymphatic: No cervical or supraclavicular adenopathy Lungs no rales or rhonchi, good excursion bilaterally Heart regular rate and rhythm, no murmur appreciated Abd soft, nontender, positive bowel sounds MSK no focal spinal tenderness, no joint edema Neuro: non-focal, well-oriented, appropriate affect Breasts: There is a mass in the right breast which occupies  the largest part of the upper breast, especially the upper outer quadrant. There are no skin changes but there is severe nipple retraction. I do not palpate any masses in the right axilla. The left breast is unremarkable   LAB RESULTS:  CMP     Component Value Date/Time   NA 141 03/16/2013 0824   NA 141 03/02/2013 1135   K 3.8 03/16/2013 0824   K 3.9 03/02/2013 1135   CL 99 03/02/2013 1135   CO2 26 03/16/2013 0824   CO2 27 03/02/2013 1135   GLUCOSE 106 03/16/2013 0824   GLUCOSE 81 03/02/2013 1135   BUN 12.0 03/16/2013 0824   BUN 11 03/02/2013 1135   CREATININE 0.8 03/16/2013 0824   CREATININE 0.75 03/02/2013 1135   CALCIUM 10.0 03/16/2013 0824   CALCIUM 9.4 03/02/2013 1135   PROT 8.4* 03/16/2013 0824   PROT 8.5* 03/02/2013 1135   ALBUMIN 3.6 03/16/2013 0824   ALBUMIN 4.2 03/02/2013 1135   AST 15 03/16/2013 0824   AST 18 03/02/2013 1135   ALT 21 03/16/2013 0824   ALT 25 03/02/2013 1135   ALKPHOS 80 03/16/2013 0824   ALKPHOS 81 03/02/2013 1135   BILITOT 0.37 03/16/2013 0824   BILITOT 0.4 03/02/2013 1135    I No  results found for this basename: SPEP, UPEP,  kappa and lambda light chains    Lab Results  Component Value Date   WBC 7.3 03/16/2013   NEUTROABS 3.4 03/16/2013   HGB 14.6 03/16/2013   HCT 43.9 03/16/2013   MCV 83.7 03/16/2013   PLT 305 03/16/2013      Chemistry      Component Value Date/Time   NA 141 03/16/2013 0824   NA 141 03/02/2013 1135   K 3.8 03/16/2013 0824   K 3.9 03/02/2013 1135   CL 99 03/02/2013 1135   CO2 26 03/16/2013 0824   CO2 27 03/02/2013 1135   BUN 12.0 03/16/2013 0824   BUN 11 03/02/2013 1135   CREATININE 0.8 03/16/2013 0824   CREATININE 0.75 03/02/2013 1135      Component Value Date/Time   CALCIUM 10.0 03/16/2013 0824   CALCIUM 9.4 03/02/2013 1135   ALKPHOS 80 03/16/2013 0824   ALKPHOS 81 03/02/2013 1135   AST 15 03/16/2013 0824   AST 18 03/02/2013 1135   ALT 21 03/16/2013 0824   ALT 25 03/02/2013 1135   BILITOT 0.37 03/16/2013 0824    BILITOT 0.4 03/02/2013 1135       No results found for this basename: LABCA2    No components found with this basename: [id number]    No results found for this basename: INR,  in the last 168 hours  Urinalysis    Component Value Date/Time   BILIRUBINUR NEGATIVE 03/02/2013 1128   UROBILINOGEN negative 03/02/2013 1128   NITRITE NEGATIVE 03/02/2013 1128   LEUKOCYTESUR small (1+) 03/02/2013 1128    STUDIES: US Breast Right  03/03/2013   CLINICAL DATA:  62 year old patient with palpable mass in the superior right breast and right nipple retraction. This is her 1st mammogram.  EXAM: DIGITAL DIAGNOSTIC  BILATERAL MAMMOGRAM WITH CAD  ULTRASOUND RIGHT BREAST  COMPARISON:  None.  ACR Breast Density Category c: The breast tissue is heterogeneously dense, which may obscure small masses.  FINDINGS: There is a prominent irregular mass like area of added density in the upper central right breast that measures approximately 5 x 5 x 3 cm, with slight distortion and associated right nipple retraction. No skin thickening is appreciated on the right. No suspicious microcalcifications are identified in the right breast.  No mass, suspicious microcalcification, or distortion is identified on the left to suggest malignancy.  Mammographic images were processed with CAD.  On physical exam, there is a firm palpable mass in the superior right breast that extends from approximately 1 o'clock position to 9:30 position. The right nipple is completely retracted. No definite skin thickening is identified. No mass is palpated in the right axilla.  Ultrasound is performed, showing a prominent irregular hypoechoic mass that causes marked posterior acoustic shadowing. This mass is greater than discuss that this mass is greater in size than the ultrasound screen, and is best measured bile mammogram and physical examination. Ultrasound abnormality spans from the 1 o'clock position to the 9:30 position in the superior right breast.   Ultrasound of the right axilla shows 2 adjacent small lymph nodes, both of which have diffusely thickened cortices. The largest of these 2 lymph nodes measures 1.1 cm x 0.7 cm.  IMPRESSION: Findings highly suspicious for malignancy in the superior right breast. Two right axillary lymph nodes demonstrate diffuse cortical thickening suspicious for metastatic disease. Findings were discussed with the patient today, and biopsy of the right breast mass and one of the lymph nodes is suggested. It  was offered to the patient to perform the biopsies today, but she would prefer to return tomorrow. Biopsy has been scheduled for 03/04/2013.  RECOMMENDATION: Ultrasound-guided core needle biopsy of palpable right breast mass and a right axillary lymph node with diffuse cortical thickening.  I have discussed the findings and recommendations with the patient. Results were also provided in writing at the conclusion of the visit.  BI-RADS CATEGORY  5: Highly suggestive of malignancy - appropriate action should be taken.   Electronically Signed   By: Britta Mccreedy M.D.   On: 03/03/2013 14:58   Mr Breast Bilateral W Wo Contrast  03/11/2013   CLINICAL DATA:  62 year old female with recently diagnosed invasive ductal carcinoma of the right breast. A biopsied right axillary lymph node was negative for metastases.  EXAM: BILATERAL BREAST MRI WITH AND WITHOUT CONTRAST  LABS:  Most recent serum creatinine was 0.8 mg/dL on 40/98/1191.  TECHNIQUE: Multiplanar, multisequence MR images of both breasts were obtained prior to and following the intravenous administration of 20ml of MultiHance.  THREE-DIMENSIONAL MR IMAGE RENDERING ON INDEPENDENT WORKSTATION:  Three-dimensional MR images were rendered by post-processing of the original MR data on an independent workstation. The three-dimensional MR images were interpreted, and findings are reported in the following complete MRI report for this study. Three dimensional images were evaluated at  the independent DynaCad workstation  COMPARISON:  Previous exams  FINDINGS: Breast composition: c:  Heterogeneous fibroglandular tissue  Background parenchymal enhancement: Moderate  Right breast: Irregular enhancing mass in the central to upper right breast with resultant nipple retraction/tethering measures approximately 4.1 cm AP, 6.8 cm transverse, and 4.9 cm craniocaudal. Several small adjacent enhancing nodules are present compatible with satellite nodules, with the type furthest enhancing nodule approximately 3 cm medial to the dominant mass measuring approximately 6 mm. There is no abnormal skin enhancement seen. No involvement of the pectoralis muscle.  Left breast: No suspicious rapidly enhancing masses or abnormal areas of enhancement in the left breast to suggest malignancy.  Lymph nodes: No definite morphologically abnormal right axillary lymph nodes are seen, with a low right axillary lymph node previously biopsied with benign pathology. Small right interpectoral lymph nodes are also seen, all of which appear to contain fatty hila. No definite internal mammary lymphadenopathy.  Ancillary findings:  None.  IMPRESSION: 1. Biopsy proven malignancy involving the central to upper right breast measuring up to 6.8 cm with several surrounding satellite nodules, and resultant retraction/tethering of the nipple.  2. No morphologically abnormal right axillary lymph nodes seen (with a low right axillary node previously biopsied with benign pathology).  3.  No MRI evidence of malignancy in the left breast.  RECOMMENDATION: Treatment plan for known right breast malignancy.  BI-RADS CATEGORY  6: Known biopsy-proven malignancy - appropriate action should be taken.   Electronically Signed   By: Edwin Cap M.D.   On: 03/11/2013 11:48   Mm Digital Diagnostic Bilat  03/03/2013   CLINICAL DATA:  62 year old patient with palpable mass in the superior right breast and right nipple retraction. This is her 1st  mammogram.  EXAM: DIGITAL DIAGNOSTIC  BILATERAL MAMMOGRAM WITH CAD  ULTRASOUND RIGHT BREAST  COMPARISON:  None.  ACR Breast Density Category c: The breast tissue is heterogeneously dense, which may obscure small masses.  FINDINGS: There is a prominent irregular mass like area of added density in the upper central right breast that measures approximately 5 x 5 x 3 cm, with slight distortion and associated right nipple retraction. No skin  thickening is appreciated on the right. No suspicious microcalcifications are identified in the right breast.  No mass, suspicious microcalcification, or distortion is identified on the left to suggest malignancy.  Mammographic images were processed with CAD.  On physical exam, there is a firm palpable mass in the superior right breast that extends from approximately 1 o'clock position to 9:30 position. The right nipple is completely retracted. No definite skin thickening is identified. No mass is palpated in the right axilla.  Ultrasound is performed, showing a prominent irregular hypoechoic mass that causes marked posterior acoustic shadowing. This mass is greater than discuss that this mass is greater in size than the ultrasound screen, and is best measured bile mammogram and physical examination. Ultrasound abnormality spans from the 1 o'clock position to the 9:30 position in the superior right breast.  Ultrasound of the right axilla shows 2 adjacent small lymph nodes, both of which have diffusely thickened cortices. The largest of these 2 lymph nodes measures 1.1 cm x 0.7 cm.  IMPRESSION: Findings highly suspicious for malignancy in the superior right breast. Two right axillary lymph nodes demonstrate diffuse cortical thickening suspicious for metastatic disease. Findings were discussed with the patient today, and biopsy of the right breast mass and one of the lymph nodes is suggested. It was offered to the patient to perform the biopsies today, but she would prefer to return  tomorrow. Biopsy has been scheduled for 03/04/2013.  RECOMMENDATION: Ultrasound-guided core needle biopsy of palpable right breast mass and a right axillary lymph node with diffuse cortical thickening.  I have discussed the findings and recommendations with the patient. Results were also provided in writing at the conclusion of the visit.  BI-RADS CATEGORY  5: Highly suggestive of malignancy - appropriate action should be taken.   Electronically Signed   By: Britta Mccreedy M.D.   On: 03/03/2013 14:58   Mm Digital Diagnostic Unilat R  03/04/2013   CLINICAL DATA:  Post biopsy of a highly suspicious right breast mass as well as a suspicious right axillary lymph node.  EXAM: DIAGNOSTIC RIGHT MAMMOGRAM POST ULTRASOUND BIOPSY  COMPARISON:  Previous exams  FINDINGS: Mammographic images were obtained following ultrasound guided biopsy of a highly suspicious mass in the upper-outer right breast as well as ultrasound-guided biopsy of a suspicious right axillary lymph node. The top hat shaped biopsy marking clip is in the targeted region of the right breast mass. A clip was not deployed into the right axillary lymph node.  IMPRESSION: Appropriate positioning of top hat shaped biopsy marking clip post ultrasound-guided biopsy of a highly suspicious right breast mass.  Final Assessment: Post Procedure Mammograms for Marker Placement   Electronically Signed   By: Edwin Cap M.D.   On: 03/04/2013 09:36   [name] Rt Breast Bx W Loc Dev 1st Lesion Img Bx Spec US Guide  03/07/2013   ADDENDUM REPORT: 03/07/2013 13:20  ADDENDUM: Pathology demonstrates grade 3 invasive mammary carcinoma. Biopsied lymph node is negative. These results are concordant. The patient was notified of results on 03/07/2013. She has no complaints regarding her biopsy site. Surgical consultation and breast MRI are recommended. The patient has an appointment for breast MRI on 03/11/2013 at 8 a.m. and an appointment for multidisciplinary clinic on 03/16/2013.    Electronically Signed   By: Jerene Dilling M.D.   On: 03/07/2013 13:20   03/07/2013   CLINICAL DATA:  62 year old female with a large highly suspicious mass in the upper-outer quadrant of the right breast with resultant  right nipple inversion, as well as moderately suspicious right axillary lymph nodes.  EXAM: ULTRASOUND GUIDED RIGHT BREAST CORE NEEDLE BIOPSY WITH VACUUM ASSIST  COMPARISON:  Previous exams.  PROCEDURE: I met with the patient and we discussed the procedure of ultrasound-guided biopsy, including benefits and alternatives. We discussed the high likelihood of a successful procedure. We discussed the risks of the procedure including infection, bleeding, tissue injury, clip migration, and inadequate sampling. Informed written consent was given. The usual time-out protocol was performed immediately prior to the procedure.  Using sterile technique and 2% Lidocaine as local anesthetic, under direct ultrasound visualization, a 12 gauge vacuum-assisteddevice was used to perform biopsy of the suspicious mass in the upper-outer quadrant of the right breast using a lateral approach. At the conclusion of the procedure, a top hat tissue marker clip was deployed into the biopsy cavity. Follow-up 2-view mammogram was performed and dictated separately.  Using sterile technique and 2% Lidocaine as local anesthetic, under direct ultrasound visualization, a 14 gauge Tenmodevice was used to perform biopsy of a moderately suspicious right axillary lymph node using a lateral approach.  IMPRESSION: Ultrasound-guided biopsy of a highly suspicious mass in the right breast as well as a moderately suspicious right axillary lymph node. No apparent complications.  Electronically Signed: By: Edwin Cap M.D. On: 03/04/2013 09:34   [name] Rt Breast Bx W Loc Dev Ea Add Lesion Img Bx Spec US Guide  03/07/2013   ADDENDUM REPORT: 03/07/2013 13:20  ADDENDUM: Pathology demonstrates grade 3 invasive mammary carcinoma. Biopsied  lymph node is negative. These results are concordant. The patient was notified of results on 03/07/2013. She has no complaints regarding her biopsy site. Surgical consultation and breast MRI are recommended. The patient has an appointment for breast MRI on 03/11/2013 at 8 a.m. and an appointment for multidisciplinary clinic on 03/16/2013.   Electronically Signed   By: Jerene Dilling M.D.   On: 03/07/2013 13:20   03/07/2013   CLINICAL DATA:  62 year old female with a large highly suspicious mass in the upper-outer quadrant of the right breast with resultant right nipple inversion, as well as moderately suspicious right axillary lymph nodes.  EXAM: ULTRASOUND GUIDED RIGHT BREAST CORE NEEDLE BIOPSY WITH VACUUM ASSIST  COMPARISON:  Previous exams.  PROCEDURE: I met with the patient and we discussed the procedure of ultrasound-guided biopsy, including benefits and alternatives. We discussed the high likelihood of a successful procedure. We discussed the risks of the procedure including infection, bleeding, tissue injury, clip migration, and inadequate sampling. Informed written consent was given. The usual time-out protocol was performed immediately prior to the procedure.  Using sterile technique and 2% Lidocaine as local anesthetic, under direct ultrasound visualization, a 12 gauge vacuum-assisteddevice was used to perform biopsy of the suspicious mass in the upper-outer quadrant of the right breast using a lateral approach. At the conclusion of the procedure, a top hat tissue marker clip was deployed into the biopsy cavity. Follow-up 2-view mammogram was performed and dictated separately.  Using sterile technique and 2% Lidocaine as local anesthetic, under direct ultrasound visualization, a 14 gauge Tenmodevice was used to perform biopsy of a moderately suspicious right axillary lymph node using a lateral approach.  IMPRESSION: Ultrasound-guided biopsy of a highly suspicious mass in the right breast as well as a  moderately suspicious right axillary lymph node. No apparent complications.  Electronically Signed: By: Edwin Cap M.D. On: 03/04/2013 09:34    ASSESSMENT: 62 y.o. [place] woman status post right breast biopsy 03/04/2013 for  a clinical T3 N1, stage IIIA invasive ductal carcinoma, grade 3, estrogen receptor 100% positive, progesterone receptor 100% positive, with an MIB-1 of 20% and no HER-2 amplification.  (1) right axillary lymph node biopsy 03/04/2013 was benign   PLAN: We spent the better part of today's hour-long appointment discussing the biology of breast cancer in general, and the specifics of the patient's tumor in particular. Kalea understands she has locally advanced disease, and will need staging studies. Specifically I am setting her up for a PET scan and CT scan of the chest. She will need chemotherapy, and I am obtaining an echocardiogram for baseline, and she will be scheduled for port.  The sequence chemotherapy then surgery as opposed to surgery than chemotherapy does not affect survival. In her case we are going to do chemotherapy "neoadjuvantly" to facilitate the surgery. I cautioned her we are not expecting a complete pathologic response, since her tumor was estrogen receptor strongly positive. Very likely she will still need a mastectomy after neoadjuvant chemotherapy.  The plan will be for docetaxel, doxorubicin, and cyclophosphamide given every 3 weeks at standard doses with Neulasta support on day 2. We discussed the possible toxicities, side effects and complications of this form of chemotherapy and she has also been scheduled for "chemotherapy school" for further discussion and it to her the treatment area.  Sari we will likely go on disability for the next several months, as her chemotherapy plan is intense. She will discuss this with the human resources department at her work. She will let us know if she needs help completing the papers. Otherwise she will see  me again on January 12, and we can tentatively start her chemotherapy on January 13.  Teela is a good understanding of the overall plan(chemotherapy, then surgery, then postmastectomy radiation, then antiestrogen). She agrees with it. She knows the goal of treatment is cure. She will call us with any problems that may develop before her next visit here.    Lowella Dell, MD   03/16/2013 12:43 PM

## 2013-03-16 NOTE — Telephone Encounter (Signed)
Per staff message and POF I have scheduled appts.  JMW  

## 2013-03-16 NOTE — Patient Instructions (Signed)
You have been diagnosed with a somewhat large tumor in your right breast which involves more than one quadrant. You will ultimately need a mastectomy and lymph node biopsies.  The tumor is large enough that I I think it would be in your best interest to have chemotherapy before surgery to shrink the tumor down and make the operation better.  You'll be scheduled for Port-A-Cath insertion.  Dr. Jacinto Halim office will call you tomorrow and  schedule this surgery    Implanted Port Instructions An implanted port is a central line that has a round shape and is placed under the skin. It is used for long-term IV (intravenous) access for:  Medicine.  Fluids.  Liquid nutrition, such as TPN (total parenteral nutrition).  Blood samples. Ports can be placed:  In the chest area just below the collarbone (this is the most common place.)  In the arms.  In the belly (abdomen) area.  In the legs. PARTS OF THE PORT A port has 2 main parts:  The reservoir. The reservoir is round, disc-shaped, and will be a small, raised area under your skin.  The reservoir is the part where a needle is inserted (accessed) to either give medicines or to draw blood.  The catheter. The catheter is a long, slender tube that extends from the reservoir. The catheter is placed into a large vein.  Medicine that is inserted into the reservoir goes into the catheter and then into the vein. INSERTION OF THE PORT  The port is surgically placed in either an operating room or in a procedural area (interventional radiology).  Medicine may be given to help you relax during the procedure.  The skin where the port will be inserted is numbed (local anesthetic).  1 or 2 small cuts (incisions) will be made in the skin to insert the port.  The port can be used after it has been inserted. INCISION SITE CARE  The incision site may have small adhesive strips on it. This helps keep the incision site closed. Sometimes, no  adhesive strips are placed. Instead of adhesive strips, a special kind of surgical glue is used to keep the incision closed.  If adhesive strips were placed on the incision sites, do not take them off. They will fall off on their own.  The incision site may be sore for 1 to 2 days. Pain medicine can help.  Do not get the incision site wet. Bathe or shower as directed by your caregiver.  The incision site should heal in 5 to 7 days. A small scar may form after the incision has healed. ACCESSING THE PORT Special steps must be taken to access the port:  Before the port is accessed, a numbing cream can be placed on the skin. This helps numb the skin over the port site.  A sterile technique is used to access the port.  The port is accessed with a needle. Only "non-coring" port needles should be used to access the port. Once the port is accessed, a blood return should be checked. This helps ensure the port is in the vein and is not clogged (clotted).  If your caregiver believes your port should remain accessed, a clear (transparent) bandage will be placed over the needle site. The bandage and needle will need to be changed every week or as directed by your caregiver.  Keep the bandage covering the needle clean and dry. Do not get it wet. Follow your caregiver's instructions on how to take a shower or  bath when the port is accessed.  If your port does not need to stay accessed, no bandage is needed over the port. FLUSHING THE PORT Flushing the port keeps it from getting clogged. How often the port is flushed depends on:  If a constant infusion is running. If a constant infusion is running, the port may not need to be flushed.  If intermittent medicines are given.  If the port is not being used. For intermittent medicines:  The port will need to be flushed:  After medicines have been given.  After blood has been drawn.  As part of routine maintenance.  A port is normally flushed  with:  Normal saline.  Heparin.  Follow your caregiver's advice on how often, how much, and the type of flush to use on your port. IMPORTANT PORT INFORMATION  Tell your caregiver if you are allergic to heparin.  After your port is placed, you will get a manufacturer's information card. The card has information about your port. Keep this card with you at all times.  There are many types of ports available. Know what kind of port you have.  In case of an emergency, it may be helpful to wear a medical alert bracelet. This can help alert health care workers that you have a port.  The port can stay in for as long as your caregiver believes it is necessary.  When it is time for the port to come out, surgery will be done to remove it. The surgery will be similar to how the port was put in.  If you are in the hospital or clinic:  Your port will be taken care of and flushed by a nurse.  If you are at home:  A home health care nurse may give medicines and take care of the port.  You or a family member can get special training and directions for giving medicine and taking care of the port at home. SEEK IMMEDIATE MEDICAL CARE IF:   Your port does not flush or you are unable to get a blood return.  New drainage or pus is coming from the incision.  A bad smell is coming from the incision site.  You develop swelling or increased redness at the incision site.  You develop increased swelling or pain at the port site.  You develop swelling or pain in the surrounding skin near the port.  You have an oral temperature above 102 F (38.9 C), not controlled by medicine. MAKE SURE YOU:   Understand these instructions.  Will watch your condition.  Will get help right away if you are not doing well or get worse. Document Released: 03/17/2005 Document Revised: 06/09/2011 Document Reviewed: 06/08/2008 [facility] Patient Information 2014 [place], [place].

## 2013-03-17 ENCOUNTER — Encounter: Payer: Self-pay | Admitting: *Deleted

## 2013-03-17 NOTE — Progress Notes (Signed)
Faxed Care Plan to Leigh at [facility] & PCP.  Took to Med Rec to scan.  

## 2013-03-18 ENCOUNTER — Encounter: Payer: Self-pay | Admitting: *Deleted

## 2013-03-18 NOTE — Progress Notes (Signed)
Mailed after appt letter to pt. 

## 2013-03-21 ENCOUNTER — Telehealth: Payer: Self-pay | Admitting: *Deleted

## 2013-03-21 ENCOUNTER — Encounter: Payer: Self-pay | Admitting: *Deleted

## 2013-03-21 NOTE — Telephone Encounter (Signed)
Called and spoke with patient from [facility] 03/16/13.  Answered multiple questions and gave support.  Patient knows to call with any further questions or concerns.

## 2013-03-21 NOTE — Progress Notes (Signed)
[facility] Psychosocial Distress Screening Clinical Social Work  Patient completed distress screening protocol, and scored a 8 on the Psychosocial Distress Thermometer which indicates severe distress. Clinical Social Worker met with pt in [facility] on 03/16/13 to assess for distress and other psychosocial needs.  Pt stated the majority of her distress was from the "fear of the unknown", but felt much better after speaking with the physicians and getting more information on her treatment plan.  CSW informed pt of the support team and support programs at [facility], and pt was agreeable to an [facility] referral.  CSW encouraged pt to call with any additional questions or concerns.    Tamala Julian, MSW, LCSW Clinical Social Worker [facility] [phone]

## 2013-03-22 ENCOUNTER — Telehealth: Payer: Self-pay | Admitting: Oncology

## 2013-03-22 ENCOUNTER — Encounter (HOSPITAL_COMMUNITY): Payer: Self-pay | Admitting: Pharmacy Technician

## 2013-03-29 ENCOUNTER — Other Ambulatory Visit: Payer: Self-pay | Admitting: Oncology

## 2013-03-29 ENCOUNTER — Ambulatory Visit (HOSPITAL_COMMUNITY)
Admission: RE | Admit: 2013-03-29 | Discharge: 2013-03-29 | Disposition: A | Discharge status: Home / Self Care | Payer: 59 | Source: Ambulatory Visit | Attending: Oncology | Admitting: Oncology

## 2013-03-29 ENCOUNTER — Other Ambulatory Visit: Payer: 59

## 2013-03-29 ENCOUNTER — Encounter: Payer: Self-pay | Admitting: *Deleted

## 2013-03-29 ENCOUNTER — Other Ambulatory Visit: Payer: Self-pay | Admitting: *Deleted

## 2013-03-29 DIAGNOSIS — C50919 Malignant neoplasm of unspecified site of unspecified female breast: Secondary | ICD-10-CM | POA: Insufficient documentation

## 2013-03-29 DIAGNOSIS — I10 Essential (primary) hypertension: Secondary | ICD-10-CM | POA: Insufficient documentation

## 2013-03-29 DIAGNOSIS — C50411 Malignant neoplasm of upper-outer quadrant of right female breast: Secondary | ICD-10-CM

## 2013-03-29 DIAGNOSIS — Z0189 Encounter for other specified special examinations: Secondary | ICD-10-CM

## 2013-03-29 DIAGNOSIS — Z01818 Encounter for other preprocedural examination: Secondary | ICD-10-CM | POA: Insufficient documentation

## 2013-03-29 MED ORDER — LORAZEPAM 0.5 MG PO TABS: ORAL_TABLET | ORAL | Status: DC

## 2013-03-29 NOTE — Progress Notes (Signed)
  Echocardiogram 2D Echocardiogram has been performed.  Denise Becker 03/29/2013, 12:23 PM

## 2013-03-30 ENCOUNTER — Encounter (HOSPITAL_COMMUNITY): Payer: 59

## 2013-03-30 ENCOUNTER — Ambulatory Visit (HOSPITAL_COMMUNITY)
Admission: RE | Admit: 2013-03-30 | Discharge: 2013-03-30 | Disposition: A | Discharge status: Home / Self Care | Payer: 59 | Source: Ambulatory Visit | Attending: Oncology | Admitting: Oncology

## 2013-03-30 DIAGNOSIS — Z171 Estrogen receptor negative status [ER-]: Secondary | ICD-10-CM | POA: Insufficient documentation

## 2013-03-30 DIAGNOSIS — C50919 Malignant neoplasm of unspecified site of unspecified female breast: Secondary | ICD-10-CM

## 2013-03-30 DIAGNOSIS — K439 Ventral hernia without obstruction or gangrene: Secondary | ICD-10-CM | POA: Insufficient documentation

## 2013-03-30 DIAGNOSIS — I771 Stricture of artery: Secondary | ICD-10-CM | POA: Insufficient documentation

## 2013-03-30 MED ORDER — IOHEXOL 300 MG/ML  SOLN
80.0000 mL | Freq: Once | INTRAMUSCULAR | Status: AC | PRN
  Administered 2013-03-30: 80 mL via INTRAVENOUS

## 2013-03-30 MED ORDER — FLUDEOXYGLUCOSE F - 18 (FDG) INJECTION
15.7000 | Freq: Once | INTRAVENOUS | Status: AC | PRN
  Administered 2013-03-30: 15.7 via INTRAVENOUS

## 2013-04-01 ENCOUNTER — Encounter: Payer: Self-pay | Admitting: *Deleted

## 2013-04-01 NOTE — Pre-Procedure Instructions (Signed)
ELAYSHA BEVARD  04/01/2013   Your procedure is scheduled on:  Wednesday, January 7.  Report to [facility], Main Entrance [facility]" at 11:30AM.  Call this number if you have problems the morning of surgery: [phone]   Remember:   Do not eat food or drink liquids after midnight Tuesday, January 6.    Take these medicines the morning of surgery with A SIP OF WATER: none.  Stop taking Aspirin, Coumadin, Plavix, Effient and Herbal medications, Vitamins.  Do not take any NSAIDs ie: Ibuprofen,  Advil,Naproxen or any medication containing Aspirin.  Do not wear jewelry, make-up or nail polish.  Do not wear lotions, powders, or perfumes. You may wear deodorant.  Do not shave 48 hours prior to surgery.  Do not bring valuables to the hospital.  [facility] is not responsible for any belongings or valuables.               Contacts, dentures or bridgework may not be worn into surgery.  Leave suitcase in the car. After surgery it may be brought to your room.  For patients admitted to the hospital, discharge time is determined by your treatment team.               Patients discharged the day of surgery will not be allowed to drive home.  Name and phone number of your driver: -   Special Instructions: Shower using CHG 2 nights before surgery and the night before surgery.  If you shower the day of surgery use CHG.  Use special wash - you have one bottle of CHG for all showers.  You should use approximately 1/3 of the bottle for each shower.   Please read over the following fact sheets that you were given: Pain Booklet, Coughing and Deep Breathing and Surgical Site Infection Prevention

## 2013-04-04 ENCOUNTER — Telehealth (INDEPENDENT_AMBULATORY_CARE_PROVIDER_SITE_OTHER): Payer: Self-pay

## 2013-04-04 ENCOUNTER — Encounter (HOSPITAL_COMMUNITY)
Admission: RE | Admit: 2013-04-04 | Discharge: 2013-04-04 | Disposition: A | Discharge status: Home / Self Care | Payer: 59 | Source: Ambulatory Visit | Attending: General Surgery | Admitting: General Surgery

## 2013-04-04 ENCOUNTER — Encounter (HOSPITAL_COMMUNITY): Payer: Self-pay

## 2013-04-04 DIAGNOSIS — Z01810 Encounter for preprocedural cardiovascular examination: Secondary | ICD-10-CM | POA: Insufficient documentation

## 2013-04-04 DIAGNOSIS — Z01812 Encounter for preprocedural laboratory examination: Secondary | ICD-10-CM | POA: Insufficient documentation

## 2013-04-04 DIAGNOSIS — Z01818 Encounter for other preprocedural examination: Secondary | ICD-10-CM | POA: Insufficient documentation

## 2013-04-04 LAB — COMPREHENSIVE METABOLIC PANEL
ALT: 23 U/L (ref 0–35)
AST: 16 U/L (ref 0–37)
Albumin: 3.6 g/dL (ref 3.5–5.2)
Alkaline Phosphatase: 74 U/L (ref 39–117)
BUN: 16 mg/dL (ref 6–23)
CALCIUM: 9.3 mg/dL (ref 8.4–10.5)
CO2: 27 meq/L (ref 19–32)
Chloride: 103 mEq/L (ref 96–112)
Creatinine, Ser: 0.81 mg/dL (ref 0.50–1.10)
GFR, EST AFRICAN AMERICAN: 88 mL/min — AB (ref >90)
GFR, EST NON AFRICAN AMERICAN: 76 mL/min — AB (ref >90)
GLUCOSE: 82 mg/dL (ref 70–99)
Potassium: 3.6 mEq/L — ABNORMAL LOW (ref 3.7–5.3)
SODIUM: 141 meq/L (ref 137–147)
Total Bilirubin: 0.3 mg/dL (ref 0.3–1.2)
Total Protein: 8.3 g/dL (ref 6.0–8.3)

## 2013-04-04 LAB — CBC WITH DIFFERENTIAL/PLATELET
BASOS PCT: 1 % (ref 0–1)
Basophils Absolute: 0.1 10*3/uL (ref 0.0–0.1)
EOS PCT: 3 % (ref 0–5)
Eosinophils Absolute: 0.3 10*3/uL (ref 0.0–0.7)
HCT: 42.7 % (ref 36.0–46.0)
HEMOGLOBIN: 14.1 g/dL (ref 12.0–15.0)
LYMPHS ABS: 3.1 10*3/uL (ref 0.7–4.0)
Lymphocytes Relative: 37 % (ref 12–46)
MCH: 28 pg (ref 26.0–34.0)
MCHC: 33 g/dL (ref 30.0–36.0)
MCV: 84.7 fL (ref 78.0–100.0)
MONOS PCT: 13 % — AB (ref 3–12)
Monocytes Absolute: 1.1 10*3/uL — ABNORMAL HIGH (ref 0.1–1.0)
Neutro Abs: 3.7 10*3/uL (ref 1.7–7.7)
Neutrophils Relative %: 46 % (ref 43–77)
PLATELETS: 296 10*3/uL (ref 150–400)
RBC: 5.04 MIL/uL (ref 3.87–5.11)
RDW: 15 % (ref 11.5–15.5)
WBC: 8.3 10*3/uL (ref 4.0–10.5)

## 2013-04-04 NOTE — Progress Notes (Signed)
Anesthesia PAT Evaluation:  Patient is a 63 year old female scheduled for Port-a-cath insertion on 04/06/13 by Dr. Dalbert Batman.  History includes new diagnosis of right breast cancer confirmed by biopsy 02/2013 with plans for chemotherapy in the near future likely followed by surgery and radiation, former smoker, morbid obesity, HTN, anxiety, depression, tonsillectomy, hysterectomy. PCP is Dr. Larina Earthly. HEM-ONC is Dr. Jana Hakim.    Pre-chemo echo on 03/19/13 showed: - Left ventricle: The cavity size was normal. Wall thickness was increased in a pattern of mild LVH. Systolic function was normal. The estimated ejection fraction was in the range of 55% to 60%. Wall motion was normal; there were no regional wall motion abnormalities. - Trivial pulmonic and tricuspid regurgitation. - Impressions: Normal EF and average global longitudinal strain from all 3 apical views -18.6.  EKG on 04/04/13 showed NSR.  CXR on 04/04/13 showed no active cardiopulmonary disease.  Preoperative labs noted.  WBC 8.3.  Patient reports acute URI that started by 04/02/13.  She had a productive cough and felt feverish initially.  Cough is better but still feels poorly due to sinus congestion.  On exam her lungs are clear without wheezing, heart RRR.  She was afebrile at PAT.  O2 sat 99%, R 18. She did not appear to feel well, but was not in any acute distress. I told her that if she was not feeling better by tomorrow then to contact her surgeon to discuss consideration of rescheduling until her symptoms improve.  She reports she is due to start chemotherapy next week, but does not want surgery or chemotherapy until she is back to her baseline.  I told her that I would notify Dr. Darrel Hoover office to contact her.  I spoke with triage nurse Glenda.  She will pass this information along to the appropriate individuals and also notify Dr. Virgie Dad office.  She know to seek further evaluation if new SOB, fever, etc. develops in the  interim.  [facility] Short Stay Center/Anesthesiology Phone [phone] 04/04/2013 2:14 PM

## 2013-04-04 NOTE — Telephone Encounter (Signed)
Denise Becker in pre [phone]) called to inform  us patient is asking to post pone her port a cath surgery and chemo, due to her  not feeling well. Informed OR  Scheduling [employer] development officer

## 2013-04-04 NOTE — Progress Notes (Signed)
Denies seeing a cardiologist. Denies having a stress test, or card cath.  Echo noted in epic from 03-29-13  PCP is Dr Lavella Lemons D. Martin Pt also sees Dr Jana Hakim  Pt in today with what she calls a "cold".  States no fever, no productive cough, has clear nasal drainage.  Also states that she does not feel good, but no fever.  This "cold" started over the weekend. Ebony Hail, [place] informed. Dr Darrel Hoover office called, Spoke with Pamala Hurry, and left message to let Dr Dalbert Batman know of above.

## 2013-04-05 ENCOUNTER — Other Ambulatory Visit (HOSPITAL_COMMUNITY): Payer: 59

## 2013-04-06 ENCOUNTER — Encounter (HOSPITAL_COMMUNITY): Admission: RE | Payer: Self-pay | Source: Ambulatory Visit

## 2013-04-06 ENCOUNTER — Ambulatory Visit (HOSPITAL_COMMUNITY): Admission: RE | Admit: 2013-04-06 | Payer: 59 | Source: Ambulatory Visit | Admitting: General Surgery

## 2013-04-06 SURGERY — INSERTION, TUNNELED CENTRAL VENOUS DEVICE, WITH PORT
Anesthesia: General

## 2013-04-08 ENCOUNTER — Telehealth: Payer: Self-pay | Admitting: *Deleted

## 2013-04-08 NOTE — Telephone Encounter (Signed)
Called pt X 2  to inform her of the PET scan results.No answer.Left detailed message about results and told her if she has any questions to give me a call back at [phone].

## 2013-04-11 ENCOUNTER — Ambulatory Visit: Payer: 59 | Admitting: Oncology

## 2013-04-11 ENCOUNTER — Other Ambulatory Visit: Payer: 59

## 2013-04-12 ENCOUNTER — Encounter (HOSPITAL_BASED_OUTPATIENT_CLINIC_OR_DEPARTMENT_OTHER): Payer: Self-pay | Admitting: *Deleted

## 2013-04-12 ENCOUNTER — Other Ambulatory Visit: Payer: Self-pay | Admitting: *Deleted

## 2013-04-12 ENCOUNTER — Telehealth: Payer: Self-pay | Admitting: *Deleted

## 2013-04-12 ENCOUNTER — Telehealth: Payer: Self-pay | Admitting: Oncology

## 2013-04-12 ENCOUNTER — Ambulatory Visit: Payer: 59

## 2013-04-12 DIAGNOSIS — C50411 Malignant neoplasm of upper-outer quadrant of right female breast: Secondary | ICD-10-CM

## 2013-04-12 NOTE — Telephone Encounter (Signed)
Sent michelle a staff message to add the pt in for a chemo appt on 04/21/2013

## 2013-04-12 NOTE — Telephone Encounter (Signed)
Pt no show for lab and MD on 1/12 and then 1/13 for 1st TAC.  Noted pt was to have port placed last week but rescheduled to this week 1/16 due to " not feeling well ".  Pt was seen in [facility] on 03/16/2013.  Pt has no further appointments for this office.  This note will be given to breast navigator Varney Biles and MD for appropriate follow up.

## 2013-04-12 NOTE — Telephone Encounter (Signed)
Called and spoke with patient and confirmed appt with Amy Berry,PA 04/19/13 at 215 for labs and 245 with Amy Berry,PA. POF sent to schedulers for 1st chemo to start 04/21/13 with injection on 04/22/13.

## 2013-04-13 ENCOUNTER — Telehealth: Payer: Self-pay | Admitting: *Deleted

## 2013-04-13 ENCOUNTER — Ambulatory Visit: Payer: 59

## 2013-04-13 NOTE — Telephone Encounter (Signed)
Per staff message and POF I have scheduled appts.  JMW  

## 2013-04-14 ENCOUNTER — Telehealth: Payer: Self-pay | Admitting: *Deleted

## 2013-04-14 ENCOUNTER — Telehealth: Payer: Self-pay | Admitting: Oncology

## 2013-04-14 NOTE — Telephone Encounter (Signed)
PT TO PICK UP HER SCHEDULE WHEN SHE COMES IN FOR HER JAN 2015 APPTS

## 2013-04-14 NOTE — Telephone Encounter (Signed)
Called and spoke with patient to make sure she had knowledge of all her appointments.  She states no one had called her with her chemo appointment.  Confirmed date and time of her chemo and her follow up with Amy Berry,PA.

## 2013-04-14 NOTE — H&P (Signed)
Denise Becker    MRN:  [id number]   Description: 63 year old female  Provider: Adin Hector, MD  Department: [facility]          Diagnoses      Breast cancer of upper-outer quadrant of right female breast    -  Primary      174.4      Essential hypertension, benign          401.1      Adjustment disorder with mixed anxiety and depressed mood          309.28           History and Physical   Adin Hector, MD     Status: Signed            Patient ID: Denise Becker, female   DOB: [birth year], 63 y.o.   MRN: [id number]            HPI Denise Becker is a 63 y.o. female.  She was referred by Dr. Wandalee Ferdinand at the breast center of [facility] for evaluation and management of a locally advanced breast cancer in the right breast. Her primary care physician is Dr. Yehuda Savannah and her gynecologist is Dr. Lahoma Crocker. I am seeing her in the breast [place] today with Dr. Jana Hakim and Dr. Pablo Ledger.   She has never had a mammogram before. She felt a mass in her right breast for the past 3 months. Denies pain or nipple discharge. She went for imaging studies after being evaluated by Dr. Delsa Sale. Mammogram and ultrasound showed a suspicious mass in the right breast, at least 5 cm in transverse dimension with obvious nipple retraction. 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 ER. Positive at 100%, PR positive at 100%, Ki-T7 20%, HER-2 negative.   MRI shows a mass in the right breast to be 4.1 x 6.8 x 4.9 cm with adjacent nodules. They said the skin looked okay and the pectoralis muscle looked okay. The left breast and other lymph nodes looked normal.    She is somewhat tearful and emotional in the office today, but very willing to undergo discussion of treatment plans and to go forward with treatment planning.   Past history  reveals TAH and BSO in the past. Anxiety and depression. Hypertension on amlodipine.   Family history reveals some type of malignancy in her mother but not breast. There is no family history of breast or ovarian cancer.   She is single. She had she had one child who is now deceased. She works as a Glass blower/designer, a moderately physical job. Denies tobacco. Drinks alcohol occasionally.      Past Medical History   Diagnosis  Date   .  Anxiety     .  Depression         history of depression after son died   .  Breast cancer of upper-outer quadrant of right female breast  03/10/2013   .  Hypertension           Past Surgical History   Procedure  Laterality  Date   .  Abdominal hysterectomy       .  Multiple tooth extractions  Bilateral           Family History   Problem  Relation  Age of Onset   .  Cancer  Mother     .  Hypertension  Mother     .  Bowel Disease  Mother     .  Lung disease  Father     .  Heart disease  Maternal Grandmother     .  Heart disease  Maternal Grandfather          Social History History   Substance Use Topics   .  Smoking status:  Former Smoker -- 1.00 packs/day       Types:  Cigarettes   .  Smokeless tobacco:  Never Used   .  Alcohol Use:  Yes         Comment: occasional wine        No Known Allergies    Current Outpatient Prescriptions   Medication  Sig  Dispense  Refill   .  Amlodipine-Olmesartan (AZOR PO)  Take 1 tablet by mouth daily.         .  Multiple Vitamin (MULTIVITAMIN) capsule  Take 1 capsule by mouth daily.         .  naproxen sodium (ANAPROX) 220 MG tablet  Take 220 mg by mouth 2 (two) times daily as needed.                Review of Systems   Constitutional: Negative for fever, chills and unexpected weight change.  HENT: Negative for congestion, hearing loss, sore throat, trouble swallowing and voice change.   Eyes: Negative for visual disturbance.  Respiratory: Negative for cough and wheezing.    Cardiovascular: Negative for chest pain, palpitations and leg swelling.  Gastrointestinal: Negative for nausea, vomiting, abdominal pain, diarrhea, constipation, blood in stool, abdominal distention and anal bleeding.  Genitourinary: Negative for hematuria, vaginal bleeding and difficulty urinating.  Musculoskeletal: Negative for arthralgias.  Skin: Negative for rash and wound.  Neurological: Negative for seizures, syncope and headaches.  Hematological: Negative for adenopathy. Does not bruise/bleed easily.  Psychiatric/Behavioral: Negative for confusion. The patient is nervous/anxious.          Physical Exam  Constitutional: She is oriented to person, place, and time. She appears well-developed and well-nourished. No distress.  HENT:   Head: Normocephalic and atraumatic.   Nose: Nose normal.   Mouth/Throat: No oropharyngeal exudate.  Eyes: Conjunctivae and EOM are normal. Pupils are equal, round, and reactive to light. Left eye exhibits no discharge. No scleral icterus.  Neck: Neck supple. No JVD present. No tracheal deviation present. No thyromegaly present.  Cardiovascular: Normal rate, regular rhythm, normal heart sounds and intact distal pulses.    No murmur heard. Pulmonary/Chest: Effort normal and breath sounds normal. No respiratory distress. She has no wheezes. She has no rales. She exhibits no tenderness.    Large mass central right breast with obvious nipple retraction. The mass feels at least 7 cm transversely and at least 5 cm vertically. Other than the nipple retraction there is no obvious skin involvement. The left breast feels normal. There is no axillary adenopathy on either side.  Abdominal: Soft. Bowel sounds are normal. She exhibits no distension and no mass. There is no tenderness. There is no rebound and no guarding.  Well healed Pfannenstiel scar.  Musculoskeletal: She exhibits no edema and no tenderness.  Lymphadenopathy:    She has no cervical adenopathy.   Neurological: She is alert and oriented to person, place, and time. She exhibits normal muscle tone. Coordination normal.  Skin: Skin is warm. No rash noted. She is not diaphoretic. No  erythema. No pallor.  Psychiatric: She has a normal mood and affect. Her behavior is normal. Judgment and thought content normal.      Data Reviewed I have reviewed her imaging studies and histology and breast diagnostic protocol. I have discussed her case in breast conference this morning. I have created a treatment plan in collaboration with Dr. Jana Hakim and Dr. Pablo Ledger.   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 T3, N0 clinically. There may or may not be dermal involvement under the nipple. Receptor positive, HER-2-negative.   This tumor is fairly large and I think the patient would benefit from downstaging from neoadjuvant chemotherapy. She agrees with this   Status post TAH and BSO   Anxiety and depression   Hypertension      Plan    We will schedule for Port-A-Cath insertion now.   Following that, Dr. Jana Hakim will supervise neoadjuvant chemotherapy   Surgical plan will ultimately be right total mastectomy and sentinel lymph node biopsy. We discussed whether or not SLN biopsy should be followed with frozen section and axillary lymph node dissection. The consensus was that since she will be getting long-term antiestrogen therapy and radiation therapy that it would not benefit her to get axillary lymph node dissection. So we will simply states he axilla with sentinel lymph node biopsy.   I discussed the indications, details, techniques, numerous risk of Port-A-Cath insertion with her. She is aware of the risk of pneumothorax, thrombosis, catheter embolization, infection, bleeding. She understands all these issues. All of her questions are answered. She agrees with this plan.   We did talk about mastectomy and sentinel node biopsy in detail as  well.We also talked about ultimate options for plastic surgical reconstruction.          Edsel Petrin. Dalbert Batman, M.D., [facility] General and Minimally invasive Surgery Breast and Colorectal Surgery Office:   [phone] Pager:   [phone]

## 2013-04-15 ENCOUNTER — Ambulatory Visit (HOSPITAL_BASED_OUTPATIENT_CLINIC_OR_DEPARTMENT_OTHER)
Admission: RE | Admit: 2013-04-15 | Discharge: 2013-04-15 | Disposition: A | Discharge status: Home / Self Care | Payer: 59 | Source: Ambulatory Visit | Attending: General Surgery | Admitting: General Surgery

## 2013-04-15 ENCOUNTER — Ambulatory Visit (HOSPITAL_COMMUNITY): Payer: 59

## 2013-04-15 ENCOUNTER — Encounter (HOSPITAL_BASED_OUTPATIENT_CLINIC_OR_DEPARTMENT_OTHER): Payer: 59 | Admitting: Vascular Surgery

## 2013-04-15 ENCOUNTER — Encounter (HOSPITAL_BASED_OUTPATIENT_CLINIC_OR_DEPARTMENT_OTHER): Payer: Self-pay | Admitting: *Deleted

## 2013-04-15 ENCOUNTER — Encounter (HOSPITAL_BASED_OUTPATIENT_CLINIC_OR_DEPARTMENT_OTHER)
Admission: RE | Disposition: A | Discharge status: Home / Self Care | Payer: Self-pay | Source: Ambulatory Visit | Attending: General Surgery

## 2013-04-15 ENCOUNTER — Ambulatory Visit (HOSPITAL_BASED_OUTPATIENT_CLINIC_OR_DEPARTMENT_OTHER): Payer: 59 | Admitting: Anesthesiology

## 2013-04-15 DIAGNOSIS — F4323 Adjustment disorder with mixed anxiety and depressed mood: Secondary | ICD-10-CM | POA: Insufficient documentation

## 2013-04-15 DIAGNOSIS — I10 Essential (primary) hypertension: Secondary | ICD-10-CM | POA: Insufficient documentation

## 2013-04-15 DIAGNOSIS — Z809 Family history of malignant neoplasm, unspecified: Secondary | ICD-10-CM | POA: Insufficient documentation

## 2013-04-15 DIAGNOSIS — C50411 Malignant neoplasm of upper-outer quadrant of right female breast: Secondary | ICD-10-CM

## 2013-04-15 DIAGNOSIS — C50419 Malignant neoplasm of upper-outer quadrant of unspecified female breast: Secondary | ICD-10-CM

## 2013-04-15 DIAGNOSIS — C50919 Malignant neoplasm of unspecified site of unspecified female breast: Secondary | ICD-10-CM | POA: Insufficient documentation

## 2013-04-15 DIAGNOSIS — Z87891 Personal history of nicotine dependence: Secondary | ICD-10-CM | POA: Insufficient documentation

## 2013-04-15 HISTORY — PX: PORTACATH PLACEMENT: SHX2246

## 2013-04-15 SURGERY — INSERTION, TUNNELED CENTRAL VENOUS DEVICE, WITH PORT
Anesthesia: Monitor Anesthesia Care | Site: Chest | Laterality: Right

## 2013-04-15 MED ORDER — HEPARIN SOD (PORK) LOCK FLUSH 100 UNIT/ML IV SOLN
INTRAVENOUS | Status: DC | PRN
  Administered 2013-04-15: 500 [IU] via INTRAVENOUS

## 2013-04-15 MED ORDER — HYDROCODONE-ACETAMINOPHEN 5-325 MG PO TABS: 1.0000 | ORAL_TABLET | ORAL | Status: DC | PRN

## 2013-04-15 MED ORDER — CEFAZOLIN SODIUM-DEXTROSE 2-3 GM-% IV SOLR
2.0000 g | INTRAVENOUS | Status: AC
  Administered 2013-04-15: 2 g via INTRAVENOUS

## 2013-04-15 MED ORDER — BUPIVACAINE-EPINEPHRINE PF 0.5-1:200000 % IJ SOLN
INTRAMUSCULAR | Status: AC
  Filled 2013-04-15: qty 30

## 2013-04-15 MED ORDER — ONDANSETRON HCL 4 MG/2ML IJ SOLN: 4.0000 mg | Freq: Once | INTRAMUSCULAR | Status: DC | PRN

## 2013-04-15 MED ORDER — ACETAMINOPHEN 650 MG RE SUPP: 650.0000 mg | RECTAL | Status: DC | PRN

## 2013-04-15 MED ORDER — PROPOFOL 10 MG/ML IV BOLUS
INTRAVENOUS | Status: DC | PRN
  Administered 2013-04-15: 150 mg via INTRAVENOUS

## 2013-04-15 MED ORDER — SODIUM CHLORIDE 0.9 % IJ SOLN: 3.0000 mL | INTRAMUSCULAR | Status: DC | PRN

## 2013-04-15 MED ORDER — HEPARIN (PORCINE) IN NACL 2-0.9 UNIT/ML-% IJ SOLN
INTRAMUSCULAR | Status: AC
  Filled 2013-04-15: qty 500

## 2013-04-15 MED ORDER — EPHEDRINE SULFATE 50 MG/ML IJ SOLN
INTRAMUSCULAR | Status: DC | PRN
  Administered 2013-04-15: 10 mg via INTRAVENOUS

## 2013-04-15 MED ORDER — FENTANYL CITRATE 0.05 MG/ML IJ SOLN: 25.0000 ug | INTRAMUSCULAR | Status: DC | PRN

## 2013-04-15 MED ORDER — HEPARIN SOD (PORK) LOCK FLUSH 100 UNIT/ML IV SOLN
INTRAVENOUS | Status: AC
  Filled 2013-04-15: qty 5

## 2013-04-15 MED ORDER — FENTANYL CITRATE 0.05 MG/ML IJ SOLN
INTRAMUSCULAR | Status: AC
  Filled 2013-04-15: qty 4

## 2013-04-15 MED ORDER — OXYCODONE HCL 5 MG PO TABS: 5.0000 mg | ORAL_TABLET | ORAL | Status: DC | PRN

## 2013-04-15 MED ORDER — ACETAMINOPHEN 325 MG PO TABS: 650.0000 mg | ORAL_TABLET | ORAL | Status: DC | PRN

## 2013-04-15 MED ORDER — PROPOFOL 10 MG/ML IV BOLUS
INTRAVENOUS | Status: AC
  Filled 2013-04-15: qty 20

## 2013-04-15 MED ORDER — LIDOCAINE HCL (CARDIAC) 20 MG/ML IV SOLN
INTRAVENOUS | Status: DC | PRN
  Administered 2013-04-15: 30 mg via INTRAVENOUS

## 2013-04-15 MED ORDER — CHLORHEXIDINE GLUCONATE 4 % EX LIQD: 1.0000 "application " | Freq: Once | CUTANEOUS | Status: DC

## 2013-04-15 MED ORDER — ONDANSETRON HCL 4 MG/2ML IJ SOLN
INTRAMUSCULAR | Status: DC | PRN
  Administered 2013-04-15: 4 mg via INTRAVENOUS

## 2013-04-15 MED ORDER — OXYCODONE HCL 5 MG PO TABS: 5.0000 mg | ORAL_TABLET | Freq: Once | ORAL | Status: DC | PRN

## 2013-04-15 MED ORDER — FENTANYL CITRATE 0.05 MG/ML IJ SOLN: 50.0000 ug | INTRAMUSCULAR | Status: DC | PRN

## 2013-04-15 MED ORDER — MIDAZOLAM HCL 2 MG/2ML IJ SOLN
INTRAMUSCULAR | Status: AC
  Filled 2013-04-15: qty 2

## 2013-04-15 MED ORDER — SODIUM CHLORIDE 0.9 % IV SOLN
250.0000 mL | INTRAVENOUS | Status: DC | PRN
Start: 2013-04-15 — End: 2013-04-15

## 2013-04-15 MED ORDER — DEXAMETHASONE SODIUM PHOSPHATE 4 MG/ML IJ SOLN
INTRAMUSCULAR | Status: DC | PRN
  Administered 2013-04-15: 10 mg via INTRAVENOUS

## 2013-04-15 MED ORDER — BUPIVACAINE-EPINEPHRINE PF 0.5-1:200000 % IJ SOLN
INTRAMUSCULAR | Status: DC | PRN
  Administered 2013-04-15: 10 mL

## 2013-04-15 MED ORDER — SODIUM CHLORIDE 0.9 % IV SOLN: INTRAVENOUS | Status: DC

## 2013-04-15 MED ORDER — METOCLOPRAMIDE HCL 5 MG/ML IJ SOLN
10.0000 mg | Freq: Once | INTRAMUSCULAR | Status: AC
  Administered 2013-04-15: 10 mg via INTRAVENOUS

## 2013-04-15 MED ORDER — FENTANYL CITRATE 0.05 MG/ML IJ SOLN
INTRAMUSCULAR | Status: AC
  Filled 2013-04-15: qty 2

## 2013-04-15 MED ORDER — HEPARIN (PORCINE) IN NACL 2-0.9 UNIT/ML-% IJ SOLN
INTRAMUSCULAR | Status: DC | PRN
  Administered 2013-04-15: 1 via INTRAVENOUS

## 2013-04-15 MED ORDER — FENTANYL CITRATE 0.05 MG/ML IJ SOLN
INTRAMUSCULAR | Status: DC | PRN
  Administered 2013-04-15: 100 ug via INTRAVENOUS

## 2013-04-15 MED ORDER — METOCLOPRAMIDE HCL 5 MG/ML IJ SOLN
INTRAMUSCULAR | Status: AC
  Filled 2013-04-15: qty 2

## 2013-04-15 MED ORDER — OXYCODONE HCL 5 MG/5ML PO SOLN: 5.0000 mg | Freq: Once | ORAL | Status: DC | PRN

## 2013-04-15 MED ORDER — MIDAZOLAM HCL 5 MG/5ML IJ SOLN
INTRAMUSCULAR | Status: DC | PRN
  Administered 2013-04-15: 2 mg via INTRAVENOUS

## 2013-04-15 MED ORDER — MIDAZOLAM HCL 2 MG/2ML IJ SOLN: 1.0000 mg | INTRAMUSCULAR | Status: DC | PRN

## 2013-04-15 MED ORDER — FENTANYL CITRATE 0.05 MG/ML IJ SOLN
25.0000 ug | INTRAMUSCULAR | Status: DC | PRN
  Administered 2013-04-15: 25 ug via INTRAVENOUS

## 2013-04-15 MED ORDER — ONDANSETRON HCL 4 MG/2ML IJ SOLN: 4.0000 mg | Freq: Four times a day (QID) | INTRAMUSCULAR | Status: DC | PRN

## 2013-04-15 MED ORDER — LACTATED RINGERS IV SOLN
INTRAVENOUS | Status: DC
  Administered 2013-04-15 (×3): via INTRAVENOUS

## 2013-04-15 MED ORDER — SODIUM CHLORIDE 0.9 % IJ SOLN: 3.0000 mL | Freq: Two times a day (BID) | INTRAMUSCULAR | Status: DC

## 2013-04-15 MED ORDER — CEFAZOLIN SODIUM-DEXTROSE 2-3 GM-% IV SOLR
INTRAVENOUS | Status: AC
  Filled 2013-04-15: qty 50

## 2013-04-15 SURGICAL SUPPLY — 55 items
BAG DECANTER FOR FLEXI CONT (MISCELLANEOUS) ×3 IMPLANT
BENZOIN TINCTURE PRP APPL 2/3 (GAUZE/BANDAGES/DRESSINGS) IMPLANT
BLADE HEX COATED 2.75 (ELECTRODE) ×3 IMPLANT
BLADE SURG 15 STRL LF DISP TIS (BLADE) ×2 IMPLANT
BLADE SURG 15 STRL SS (BLADE) ×4
CANISTER SUCT 1200ML W/VALVE (MISCELLANEOUS) IMPLANT
CHLORAPREP W/TINT 26ML (MISCELLANEOUS) ×3 IMPLANT
CLOSURE WOUND 1/2 X4 (GAUZE/BANDAGES/DRESSINGS)
COVER MAYO STAND STRL (DRAPES) ×3 IMPLANT
COVER PROBE 5X48 (MISCELLANEOUS) ×2
COVER TABLE BACK 60X90 (DRAPES) ×3 IMPLANT
DECANTER SPIKE VIAL GLASS SM (MISCELLANEOUS) IMPLANT
DERMABOND ADVANCED (GAUZE/BANDAGES/DRESSINGS) ×4
DERMABOND ADVANCED .7 DNX12 (GAUZE/BANDAGES/DRESSINGS) ×2 IMPLANT
DRAPE C-ARM 42X72 X-RAY (DRAPES) ×3 IMPLANT
DRAPE LAPAROSCOPIC ABDOMINAL (DRAPES) ×3 IMPLANT
DRAPE UTILITY XL STRL (DRAPES) ×3 IMPLANT
DRSG TEGADERM 2-3/8X2-3/4 SM (GAUZE/BANDAGES/DRESSINGS) IMPLANT
DRSG TEGADERM 4X10 (GAUZE/BANDAGES/DRESSINGS) IMPLANT
DRSG TEGADERM 4X4.75 (GAUZE/BANDAGES/DRESSINGS) IMPLANT
ELECT REM PT RETURN 9FT ADLT (ELECTROSURGICAL) ×3
ELECTRODE REM PT RTRN 9FT ADLT (ELECTROSURGICAL) ×1 IMPLANT
GAUZE SPONGE 4X4 16PLY XRAY LF (GAUZE/BANDAGES/DRESSINGS) ×3 IMPLANT
GLOVE BIO SURGEON STRL SZ7 (GLOVE) ×3 IMPLANT
GLOVE EUDERMIC 7 POWDERFREE (GLOVE) ×3 IMPLANT
GOWN STRL REUS W/ TWL LRG LVL3 (GOWN DISPOSABLE) IMPLANT
GOWN STRL REUS W/ TWL XL LVL3 (GOWN DISPOSABLE) ×2 IMPLANT
GOWN STRL REUS W/TWL LRG LVL3 (GOWN DISPOSABLE)
GOWN STRL REUS W/TWL XL LVL3 (GOWN DISPOSABLE) ×4
IV CATH PLACEMENT UNIT 16 GA (IV SOLUTION) IMPLANT
IV KIT MINILOC 20X1 SAFETY (NEEDLE) IMPLANT
KIT BARDPORT ISP (Port) IMPLANT
KIT CVR 48X5XPRB PLUP LF (MISCELLANEOUS) ×1 IMPLANT
KIT PORT POWER 8FR ISP CVUE (Catheter) ×3 IMPLANT
KIT PORT POWER ISP 8FR (Catheter) IMPLANT
NEEDLE BLUNT 17GA (NEEDLE) IMPLANT
NEEDLE HYPO 22GX1.5 SAFETY (NEEDLE) ×3 IMPLANT
NEEDLE HYPO 25X1 1.5 SAFETY (NEEDLE) ×3 IMPLANT
PACK BASIN DAY SURGERY FS (CUSTOM PROCEDURE TRAY) ×3 IMPLANT
PENCIL BUTTON HOLSTER BLD 10FT (ELECTRODE) ×3 IMPLANT
SET SHEATH INTRODUCER 10FR (MISCELLANEOUS) IMPLANT
SHEATH COOK PEEL AWAY SET 9F (SHEATH) IMPLANT
SLEEVE SCD COMPRESS KNEE MED (MISCELLANEOUS) ×3 IMPLANT
SPONGE GAUZE 4X4 12PLY STER LF (GAUZE/BANDAGES/DRESSINGS) ×3 IMPLANT
STRIP CLOSURE SKIN 1/2X4 (GAUZE/BANDAGES/DRESSINGS) IMPLANT
SUT MNCRL AB 4-0 PS2 18 (SUTURE) ×3 IMPLANT
SUT PROLENE 2 0 CT2 30 (SUTURE) ×3 IMPLANT
SUT VICRYL 3-0 CR8 SH (SUTURE) ×3 IMPLANT
SYR 5ML LUER SLIP (SYRINGE) ×3 IMPLANT
SYR CONTROL 10ML LL (SYRINGE) ×3 IMPLANT
TOWEL OR 17X24 6PK STRL BLUE (TOWEL DISPOSABLE) ×6 IMPLANT
TOWEL OR NON WOVEN STRL DISP B (DISPOSABLE) ×3 IMPLANT
TUBE CONNECTING 20'X1/4 (TUBING)
TUBE CONNECTING 20X1/4 (TUBING) IMPLANT
YANKAUER SUCT BULB TIP NO VENT (SUCTIONS) IMPLANT

## 2013-04-15 NOTE — Transfer of Care (Signed)
Immediate Anesthesia Transfer of Care Note  Patient: Denise Becker  Procedure(s) Performed: Procedure(s): INSERTION PORT-A-CATH (Right)  Patient Location: PACU  Anesthesia Type:General  Level of Consciousness: awake, sedated and patient cooperative  Airway & Oxygen Therapy: Patient Spontanous Breathing and Patient connected to face mask oxygen  Post-op Assessment: Report given to PACU RN and Post -op Vital signs reviewed and stable  Post vital signs: Reviewed and stable  Complications: No apparent anesthesia complications

## 2013-04-15 NOTE — Discharge Instructions (Signed)
° ° °PORT-A-CATH: POST OP INSTRUCTIONS ° °Always review your discharge instruction sheet given to you by the facility where your surgery was performed.  ° °1. A prescription for pain medication may be given to you upon discharge. Take your pain medication as prescribed, if needed. If narcotic pain medicine is not needed, then you make take acetaminophen (Tylenol) or ibuprofen (Advil) as needed.  °2. Take your usually prescribed medications unless otherwise directed. °3. If you need a refill on your pain medication, please contact our office. All narcotic pain medicine now requires a paper prescription.  Phoned in and fax refills are no longer allowed by law.  Prescriptions will not be filled after 5 pm or on weekends.  °4. You should follow a light diet for the remainder of the day after your procedure. °5. Most patients will experience some mild swelling and/or bruising in the area of the incision. It may take several days to resolve. °6. It is common to experience some constipation if taking pain medication after surgery. Increasing fluid intake and taking a stool softener (such as Colace) will usually help or prevent this problem from occurring. A mild laxative (Milk of Magnesia or Miralax) should be taken according to package directions if there are no bowel movements after 48 hours.  °7. Unless discharge instructions indicate otherwise, you may remove your bandages 48 hours after surgery, and you may shower at that time. You may have steri-strips (small white skin tapes) in place directly over the incision.  These strips should be left on the skin for 7-10 days.  If your surgeon used Dermabond (skin glue) on the incision, you may shower in 24 hours.  The glue will flake off over the next 2-3 weeks.  °8. If your port is left accessed at the end of surgery (needle left in port), the dressing cannot get wet and should only by changed by a healthcare professional. When the port is no longer accessed (when the  needle has been removed), follow step 7.   °9. ACTIVITIES:  Limit activity involving your arms for the next 72 hours. Do no strenuous exercise or activity for 1 week. You may drive when you are no longer taking prescription pain medication, you can comfortably wear a seatbelt, and you can maneuver your car. °10.You may need to see your doctor in the office for a follow-up appointment.  Please °      check with your doctor.  °11.When you receive a new Port-a-Cath, you will get a product guide and  °      ID card.  Please keep them in case you need them. ° °WHEN TO CALL YOUR DOCTOR ([phone]): °1. Fever over 101.0 °2. Chills °3. Continued bleeding from incision °4. Increased redness and tenderness at the site °5. Shortness of breath, difficulty breathing ° ° °The clinic staff is available to answer your questions during regular business hours. Please don’t hesitate to call and ask to speak to one of the nurses or medical assistants for clinical concerns. If you have a medical emergency, go to the nearest emergency room or call 911.  A surgeon from [facility] is always on call at the hospital.  ° ° ° °For further information, please visit [email or web address] ° ° °Post Anesthesia Home Care Instructions ° °Activity: °Get plenty of rest for the remainder of the day. A responsible adult should stay with you for 24 hours following the procedure.  °For the next 24 hours, DO NOT: °-Drive a car °-  Operate machinery °-Drink alcoholic beverages °-Take any medication unless instructed by your physician °-Make any legal decisions or sign important papers. ° °Meals: °Start with liquid foods such as gelatin or soup. Progress to regular foods as tolerated. Avoid greasy, spicy, heavy foods. If nausea and/or vomiting occur, drink only clear liquids until the nausea and/or vomiting subsides. Call your physician if vomiting continues. ° °Special Instructions/Symptoms: °Your throat may feel dry or sore from the  anesthesia or the breathing tube placed in your throat during surgery. If this causes discomfort, gargle with warm salt water. The discomfort should disappear within 24 hours. ° ° ° ° ° ° °

## 2013-04-15 NOTE — Interval H&P Note (Signed)
History and Physical Interval Note:  04/15/2013 12:59 PM  Denise Becker  has presented today for surgery, with the diagnosis of cancer right breast   The goals and the  various methods of treatment have been discussed with the patient and family. After consideration of risks, benefits and other options for treatment, the patient has consented to  Procedure(s): INSERTION PORT-A-CATH (N/A) as a surgical intervention .  The patient's history has been reviewed, patient examined, no change in status, stable for surgery.  I have reviewed the patient's chart and labs.  Questions were answered to the patient's satisfaction.     Adin Hector

## 2013-04-15 NOTE — Op Note (Signed)
Patient Name:           Denise Becker   Date of Surgery:        04/15/2013  Pre op Diagnosis:      Locally advanced cancer right breast  Post op Diagnosis:    Same  Procedure:                 Insertion of 8 French power port ClearView, tunnelled venous vascular access device with fluoroscopic guidance and positioning and ultrasound venipuncture guidance  Surgeon:                     Edsel Petrin. Dalbert Batman, M.D., FACS  Assistant:                      None  Operative Indications:   Denise Becker is a 63 y.o. female. She was referred by Dr. Wandalee Ferdinand at the breast center of [facility] for evaluation and management of a locally advanced breast cancer in the right breast. Her primary care physician is Dr. Yehuda Savannah and her gynecologist is Dr. Lahoma Crocker. I recently saw her in the breast [place] today with Dr. Jana Hakim and Dr. Pablo Ledger.  She has never had a mammogram before. She felt a mass in her right breast for the past 3 months. Denies pain or nipple discharge. She went for imaging studies after being evaluated by Dr. Delsa Sale. Mammogram and ultrasound showed a suspicious mass in the right breast, at least 5 cm in transverse dimension with obvious nipple retraction. 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 ER. Positive at 100%, PR positive at 100%, Ki-T7 20%, HER-2 negative.  MRI shows a mass in the right breast to be 4.1 x 6.8 x 4.9 cm with adjacent nodules. They said the skin looked okay and the pectoralis muscle looked okay. The left breast and other lymph nodes looked normal. Examination confirms a large mass in the central right breast with obvious nipple retraction. This feels at least 7 cm transversely and  at least 5 cm vertically. Other than the nipple retraction there is no obvious skin involvement. There is no palpable axillary adenopathy. Treatment  planning is for neoadjuvant chemotherapy and then subsequent mastectomy.  Past history reveals TAH and BSO in the past. Anxiety and depression. Hypertension on amlodipine.   Operative Findings:       A left subclavian venipuncture was unsuccessful. I could not get any blood return. A right subclavian venipuncture was successful I was able to pass a wire but the dilator would not go. On both sides I felt that this was because of the patient's short body habitus and very close approximation of the first rib to the clavicle. I was able to easily access the right internal jugular vein and positioned the catheter in the superior vena cava and the catheter flushed well and had good blood return at the end of the case.  Procedure in Detail:          Following the induction of general LMA anesthesia the patient was positioned with a roll behind her shoulders and her arms tucked at her side. Intravenous antibiotics were given. The neck and chest were prepped and draped in a sterile fashion. Surgical time out was performed. 0.5% Marcaine with epinephrine was used for local infiltration anesthetic.    A left subclavian venipuncture was attempted  with 2 or 3 passes but I could not get any blood return and so I abandoned a left subclavian approach. A right subclavian venipuncture was successful and I couldn't thread a guidewire into the superior vena cava. I made a small incision at the site. A transverse incision was made below the medial third of the right  clavicle and a subcutaneous pocket was created Using a tunneling device I passed the catheter from the subclavian wire insertion site to the port pocket site. Using the fluoroscope I measured the length that the catheter should be with the tip in the SVC at the right atrial junction. I cut the catheter 22 cm. I then secured it to the port with  the locking device and  placed the port in the pocket. I attempted to pass the dilator and peel-away sheath several times  and it simply would not go all the way under the clavicle past the first rib. I felt that this must either be due to a bony anatomy or Halsted's ligament and so I abandoned this site.       I then opened a new catheter kit. The ultrasound was brought to the operative field. Using ultrasound I found the internal jugular vein and carotid artery and performed a right internal jugular venipuncture with good blood flow. I was able  to pass the wire into the superior vena cava without difficulty. Made a small incision at this site. Using the tunneling device I passed the catheter from the right neck wire insertion site to the right infraclavicular port pocket site. Once again using fluoroscopy I measured the catheter length and cut it  26 cm. I cut the catheter and secured it to the port and put the port in the pocket. I flushed the port and catheter. I then easily passed the dilator and peel-away sheath down the guidewire. I removed the wire and the dilator and then threaded the catheter through the peel-away sheath and removed the peel-away sheath. I had excellent blood return and it flushed easily. The catheter appeared to be positioned well under fluoroscopy. I flushed the port and catheter with concentrated heparin. I then sutured the port in place with 3 interrupted sutures of 2-0 Prolene. Subcutaneous tissue was closed with 3-0 Vicryl and the skin was closed with subcuticular stitches of 4-0 Monocryl and Dermabond. The patient was stable during the procedure. EBL 15 cc. Counts correct. Consultations none. A chest x-ray is planned.     Edsel Petrin. Dalbert Batman, M.D., FACS General and Minimally Invasive Surgery Breast and Colorectal Surgery  04/15/2013 2:11 PM

## 2013-04-15 NOTE — Anesthesia Postprocedure Evaluation (Signed)
  Anesthesia Post-op Note  Patient: LETRICIA KRINSKY  Procedure(s) Performed: Procedure(s): INSERTION PORT-A-CATH (Right)  Patient Location: PACU  Anesthesia Type:General  Level of Consciousness: awake, alert  and oriented  Airway and Oxygen Therapy: Patient Spontanous Breathing and Patient connected to nasal cannula oxygen  Post-op Pain: mild  Post-op Assessment: Post-op Vital signs reviewed, Patient's Cardiovascular Status Stable, Respiratory Function Stable, Patent Airway and Pain level controlled  Post-op Vital Signs: stable  Complications: No apparent anesthesia complications

## 2013-04-15 NOTE — Anesthesia Preprocedure Evaluation (Signed)
Anesthesia Evaluation  Patient identified by MRN, date of birth, ID band Patient awake    Reviewed: Allergy & Precautions, H&P , NPO status   Airway Mallampati: II      Dental  (+) Teeth Intact and Loose,    Pulmonary former smoker,  breath sounds clear to auscultation        Cardiovascular hypertension, Rhythm:Regular Rate:Normal     Neuro/Psych    GI/Hepatic   Endo/Other    Renal/GU      Musculoskeletal   Abdominal   Peds  Hematology   Anesthesia Other Findings   Reproductive/Obstetrics                           Anesthesia Physical Anesthesia Plan  ASA: III  Anesthesia Plan: MAC   Post-op Pain Management:    Induction: Intravenous  Airway Management Planned: Simple Face Mask and Natural Airway  Additional Equipment:   Intra-op Plan:   Post-operative Plan:   Informed Consent: I have reviewed the patients History and Physical, chart, labs and discussed the procedure including the risks, benefits and alternatives for the proposed anesthesia with the patient or authorized representative who has indicated his/her understanding and acceptance.   Dental advisory given  Plan Discussed with: CRNA and Anesthesiologist  Anesthesia Plan Comments:         Anesthesia Quick Evaluation

## 2013-04-15 NOTE — Anesthesia Procedure Notes (Signed)
Procedure Name: LMA Insertion Date/Time: 04/15/2013 1:11 PM Performed by: Lyndee Leo Pre-anesthesia Checklist: Patient identified, Emergency Drugs available, Suction available and Patient being monitored Patient Re-evaluated:Patient Re-evaluated prior to inductionOxygen Delivery Method: Circle System Utilized Preoxygenation: Pre-oxygenation with 100% oxygen Intubation Type: IV induction Ventilation: Mask ventilation without difficulty LMA: LMA inserted LMA Size: 4.0 Number of attempts: 1 Airway Equipment and Method: bite block Placement Confirmation: positive ETCO2 Tube secured with: Tape Dental Injury: Teeth and Oropharynx as per pre-operative assessment

## 2013-04-18 ENCOUNTER — Encounter (HOSPITAL_BASED_OUTPATIENT_CLINIC_OR_DEPARTMENT_OTHER): Payer: Self-pay | Admitting: General Surgery

## 2013-04-19 ENCOUNTER — Encounter: Payer: Self-pay | Admitting: Dietician

## 2013-04-19 ENCOUNTER — Telehealth: Payer: Self-pay | Admitting: Oncology

## 2013-04-19 ENCOUNTER — Encounter: Payer: Self-pay | Admitting: Physician Assistant

## 2013-04-19 ENCOUNTER — Ambulatory Visit (HOSPITAL_BASED_OUTPATIENT_CLINIC_OR_DEPARTMENT_OTHER): Payer: 59 | Admitting: Physician Assistant

## 2013-04-19 ENCOUNTER — Other Ambulatory Visit (HOSPITAL_BASED_OUTPATIENT_CLINIC_OR_DEPARTMENT_OTHER): Payer: 59

## 2013-04-19 VITALS — BP 176/98 | HR 71 | Temp 97.4°F | Resp 18 | Ht 60.0 in | Wt 208.5 lb

## 2013-04-19 DIAGNOSIS — Z170 Estrogen receptor positive status [ER+]: Secondary | ICD-10-CM

## 2013-04-19 DIAGNOSIS — C50411 Malignant neoplasm of upper-outer quadrant of right female breast: Secondary | ICD-10-CM

## 2013-04-19 DIAGNOSIS — F411 Generalized anxiety disorder: Secondary | ICD-10-CM

## 2013-04-19 DIAGNOSIS — C50419 Malignant neoplasm of upper-outer quadrant of unspecified female breast: Secondary | ICD-10-CM

## 2013-04-19 LAB — COMPREHENSIVE METABOLIC PANEL (CC13)
ALBUMIN: 3.7 g/dL (ref 3.5–5.0)
ALK PHOS: 67 U/L (ref 40–150)
ALT: 16 U/L (ref 0–55)
AST: 12 U/L (ref 5–34)
Anion Gap: 9 mEq/L (ref 3–11)
BUN: 11.6 mg/dL (ref 7.0–26.0)
CO2: 25 mEq/L (ref 22–29)
Calcium: 9.6 mg/dL (ref 8.4–10.4)
Chloride: 106 mEq/L (ref 98–109)
Creatinine: 0.8 mg/dL (ref 0.6–1.1)
GLUCOSE: 91 mg/dL (ref 70–140)
POTASSIUM: 3.8 meq/L (ref 3.5–5.1)
Sodium: 140 mEq/L (ref 136–145)
Total Bilirubin: 0.3 mg/dL (ref 0.20–1.20)
Total Protein: 8 g/dL (ref 6.4–8.3)

## 2013-04-19 LAB — CBC WITH DIFFERENTIAL/PLATELET
BASO%: 0.6 % (ref 0.0–2.0)
Basophils Absolute: 0.1 10*3/uL (ref 0.0–0.1)
EOS ABS: 0.2 10*3/uL (ref 0.0–0.5)
EOS%: 2.2 % (ref 0.0–7.0)
HCT: 44.7 % (ref 34.8–46.6)
HGB: 14.4 g/dL (ref 11.6–15.9)
LYMPH%: 32.5 % (ref 14.0–49.7)
MCH: 27.2 pg (ref 25.1–34.0)
MCHC: 32.3 g/dL (ref 31.5–36.0)
MCV: 84.3 fL (ref 79.5–101.0)
MONO#: 0.9 10*3/uL (ref 0.1–0.9)
MONO%: 10.6 % (ref 0.0–14.0)
NEUT%: 54.1 % (ref 38.4–76.8)
NEUTROS ABS: 4.7 10*3/uL (ref 1.5–6.5)
Platelets: 310 10*3/uL (ref 145–400)
RBC: 5.3 10*6/uL (ref 3.70–5.45)
RDW: 14.9 % — AB (ref 11.2–14.5)
WBC: 8.6 10*3/uL (ref 3.9–10.3)
lymph#: 2.8 10*3/uL (ref 0.9–3.3)

## 2013-04-19 MED ORDER — PROCHLORPERAZINE MALEATE 10 MG PO TABS
ORAL_TABLET | ORAL | Status: DC
Start: 2013-04-19 — End: 2013-04-25

## 2013-04-19 MED ORDER — DEXAMETHASONE 4 MG PO TABS: ORAL_TABLET | ORAL | Status: DC

## 2013-04-19 MED ORDER — ONDANSETRON HCL 8 MG PO TABS: 8.0000 mg | ORAL_TABLET | Freq: Two times a day (BID) | ORAL | Status: DC | PRN

## 2013-04-19 MED ORDER — LIDOCAINE-PRILOCAINE 2.5-2.5 % EX CREA: 1.0000 "application " | TOPICAL_CREAM | CUTANEOUS | Status: DC | PRN

## 2013-04-19 NOTE — Progress Notes (Signed)
ID: [name] OB: 01-01-1951  MR#: [id number]  CSN#:[id number]  PCP: Ron Parker, MD GYN:  Lahoma Crocker SU: Fanny Skates OTHER MD: Thea Silversmith  CHIEF COMPLAINT: Right Breast Cancer/Neoadjuvant Chemo   HISTORY OF PRESENT ILLNESS: Denise Becker palpated a change in her right breast sometime in August or September 2014. She was aware that this might be significant, but waited on meds until she saw her gynecologist for a routine visit. Dr. Delsa Sale immediately he set her up for right mammography and ultrasonography performed 03/03/2013. Mammography found an area of density in the upper outer quadrant of the right breast measuring 5 cm in the with right nipple retraction. There was no evidence of skin thickening. On exam there was a firm palpable mass in the superior right breast extending from 1:00 to 9:00. The right nipple was completely retracted. There was no skin thickening. The right axilla was benign by palpation. Ultrasound showed a prominent irregular hypoechoic mass larger than the ultrasound screen. Ultrasound of the right axilla showed 2 adjacent small lymph nodes with diffusely thickened cortices, the largest measuring 1.1 cm.  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 There was no HER-2 amplification with a ratio of 1.46 by CISH and an average copy number of 2.85.  On 03/11/2013 the patient underwent bilateral breast MRI. This showed an irregular enhancing mass in the central to upper right breast with nipple retraction measuring in total 6.8 cm. There were several adjacent enhancing nodules as well. There was no involvement of the pectoralis muscle. There were no  definite morphologically abnormal right axillary lymph nodes there was no definite internal mammary or lymphadenopathy. Left breast was unremarkable.  The patient's subsequent history is as detailed below   INTERVAL HISTORY: Denise Becker returns today for followup of her locally advanced right breast carcinoma. She is ready to initiate her neoadjuvant chemotherapy, and her first of 6 planned q. three-week doses of docetaxel/doxorubicin/cyclophosphamide is scheduled later this week on 04/21/2013. She will be receiving Neulasta on day 2 for granulocyte support. She is here today to review her treatment plan and discuss her antinausea medications.  Physically, Denise Becker is feeling well with the exception of some anxiety. She does have some tingling sensations in the right breast at times, but no actual pain.   REVIEW OF SYSTEMS: Denise Becker has had no recent illnesses and denies fevers or chills. She's had no skin changes and denies any abnormal bleeding. Her energy level is good as is her appetite, and she denies any nausea or emesis. She has some abdominal cramping and gas, but no significant change in bowel habits. No change in urinary habits. No cough, shortness of breath, chest pain, or palpitations. No abnormal headaches, dizziness, or change in vision. She has chronic joint pain associated with arthritis but denies any new pain elsewhere. She's had no peripheral swelling, and a baseline denies any peripheral neuropathy.  A detailed view of systems is otherwise stable and noncontributory.  PAST MEDICAL HISTORY: Past Medical History  Diagnosis Date  . Anxiety   . Depression     history of depression after son died  . Breast cancer of upper-outer quadrant of right female breast 03/10/2013  . Hypertension     PAST SURGICAL HISTORY: Past Surgical  History  Procedure Laterality Date  . Abdominal hysterectomy    . Multiple tooth extractions Bilateral   . Tonsillectomy    . Cesarean section    .  Portacath placement Right 04/15/2013    Procedure: INSERTION PORT-A-CATH;  Surgeon: Adin Hector, MD;  Location: [facility];  Service: General;  Laterality: Right;    FAMILY HISTORY Family History  Problem Relation Age of Onset  . Cancer Mother   . Hypertension Mother   . Bowel Disease Mother   . Lung disease Father   . Heart disease Maternal Grandmother   . Heart disease Maternal Grandfather    the patient's mother,Flowrene Millings, had a lung mass but apparently died from unrelated causes (she was followed by Dr. Earlie Server here). She was 64 years old. The patient has little information about her father. The patient had 2 brothers, no sisters. There is no history of breast or ovarian cancer in the family to her knowledge.  GYNECOLOGIC HISTORY:  Menarche age 61, first live birth age 79, the patient is GX P1. She went through the change of life approximately 1999. She did not take hormone replacement.  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 She will need to be on disability throughout her treatment. She is single, lives alone with her [place]. The patient's son Helen Hashimoto died at the age of 24 with acute leukemia. The patient has no grandchildren.    ADVANCED DIRECTIVES: Not in place, but the patient was given the appropriate documents to complete and notarize on her 03/16/2013 visit. She intends to name her cousin, Sharrie Rothman, as her healthcare power of attorney. She can be reached at [place]:  (Updated January 2015) History  Substance Use Topics  . Smoking status: Former Smoker -- 1.00 packs/day    Types: Cigarettes    Quit date: 04/12/2001  . Smokeless tobacco: Never Used  . Alcohol Use: Yes     Comment: occasional wine     Colonoscopy: Not on file  PAP: UTD/Dr. Delsa Sale  Bone density:  Never  Lipid panel:  Dec 2014/Dr.  Jackson-Moore   No Known Allergies  Current Outpatient Prescriptions  Medication Sig Dispense Refill  . amLODipine-olmesartan (AZOR) 5-40 MG per tablet Take 1 tablet by mouth daily.      . hydrocortisone 2.5 % cream Apply 1 application topically 2 (two) times daily.      . Multiple Vitamin (MULTIVITAMIN) capsule Take 1 capsule by mouth daily.      Marland Kitchen dexamethasone (DECADRON) 4 MG tablet 2 tabs by mouth twice daily with food on day before and 3 days after each chemo dose  30 tablet  2  . HYDROcodone-acetaminophen (NORCO/VICODIN) 5-325 MG per tablet Take 1-2 tablets by mouth every 4 (four) hours as needed.  40 tablet  0  . lidocaine-prilocaine (EMLA) cream Apply 1 application topically as needed. 1-2 hr before port access  30 g  1  . LORazepam (ATIVAN) 0.5 MG tablet Take 0.5 mg by mouth. Take 1 -2 tabs at night as needed for sleep      . naproxen sodium (ANAPROX) 220 MG tablet Take 220 mg by mouth 2 (two) times daily as needed.      . ondansetron (ZOFRAN) 8 MG tablet Take 1 tablet (8 mg total) by mouth 2 (two) times daily as needed for nausea or vomiting (Nausea or vomiting).  30 tablet  1  .  prochlorperazine (COMPAZINE) 10 MG tablet 1 tab by mouth with meals and at bedtime x 3 days after chemo, then 1 tab by mouth every 6 hr as needed for nausea  30 tablet  2   No current facility-administered medications for this visit.    OBJECTIVE: Middle-aged [place] American woman who appears slightly anxious but is in no acute distress Filed Vitals:   04/19/13 1433  BP: 176/98  Pulse: 71  Temp: 97.4 F (36.3 C)  Resp: 18     Body mass index is 40.72 kg/(m^2).    ECOG FS:0 - Asymptomatic Filed Weights   04/19/13 1433  Weight: 208 lb 8 oz (94.575 kg)   Physical Exam: HEENT:  Sclerae anicteric.  Oropharynx clear and moist. Neck is supple. Trachea midline. No thyromegaly palpated.  NODES:  No cervical or supraclavicular lymphadenopathy palpated.  BREAST EXAM:  There is a large easily palpated a  mass in the upper portion of the right breast, measuring approximately 6-7 cm. There is right nipple inversion. No additional skin changes. Left breast is unremarkable. Axillae are benign bilaterally no palpable lymphadenopathy. LUNGS:  Clear to auscultation bilaterally.  No wheezes or rhonchi HEART:  Regular rate and rhythm. No murmur appreciated ABDOMEN:  Soft, nontender. No organomegaly palpated Positive bowel sounds.  MSK:  No focal spinal tenderness to palpation. Full range of motion bilaterally in the upper extremities. EXTREMITIES:  No peripheral edema.   SKIN:  Benign with no visible rashes or skin changes. No nail dyscrasia. No pallor. Port is intact in the right upper chest wall with well-healing incision. No erythema, edema, or signs of infection/cellulitis. NEURO:  Nonfocal. Well oriented.  Appropriate affect.   LAB RESULTS:   Lab Results  Component Value Date   WBC 8.6 04/19/2013   NEUTROABS 4.7 04/19/2013   HGB 14.4 04/19/2013   HCT 44.7 04/19/2013   MCV 84.3 04/19/2013   PLT 310 04/19/2013      Chemistry      Component Value Date/Time   NA 140 04/19/2013 1418   NA 141 04/04/2013 1031   K 3.8 04/19/2013 1418   K 3.6* 04/04/2013 1031   CL 103 04/04/2013 1031   CO2 25 04/19/2013 1418   CO2 27 04/04/2013 1031   BUN 11.6 04/19/2013 1418   BUN 16 04/04/2013 1031   CREATININE 0.8 04/19/2013 1418   CREATININE 0.81 04/04/2013 1031   CREATININE 0.75 03/02/2013 1135      Component Value Date/Time   CALCIUM 9.6 04/19/2013 1418   CALCIUM 9.3 04/04/2013 1031   ALKPHOS 67 04/19/2013 1418   ALKPHOS 74 04/04/2013 1031   AST 12 04/19/2013 1418   AST 16 04/04/2013 1031   ALT 16 04/19/2013 1418   ALT 23 04/04/2013 1031   BILITOT 0.30 04/19/2013 1418   BILITOT 0.3 04/04/2013 1031       STUDIES:  Echocardiogram on 03/29/2013 showed an ejection fraction of 55-60%.    Ct Chest W Contrast  03/30/2013   CLINICAL DATA:  Breast cancer.  EXAM: CT CHEST WITH CONTRAST  TECHNIQUE: Multidetector CT imaging of  the chest was performed during intravenous contrast administration.  CONTRAST:  58mL OMNIPAQUE IOHEXOL 300 MG/ML  SOLN  COMPARISON:  PET-CT, same date.  FINDINGS: The large right breast lesion is noted. This is positive on the PET scan. There are multiple small scattered axillary and subpectoral lymph nodes but no hypermetabolism was seen on the PET scan. No enlarged internal mammary lymph nodes. The bony thorax is intact. No  destructive bone lesions or spinal canal compromise.  The heart is normal in size. No pericardial effusion. No mediastinal or hilar mass or adenopathy. The esophagus is grossly normal. The aorta is normal in caliber. No dissection. Mild tortuosity.  Examination of the lung parenchyma demonstrates no worrisome pulmonary masses or nodules. No pleural effusion.  The upper abdomen is unremarkable. No findings to suggest metastatic disease.  IMPRESSION: 5 cm right breast mass without CT or PET findings to suggest axillary, supraclavicular or internal mammary disease.  No findings for pulmonary metastatic disease or bone lesions.   Electronically Signed   By: Kalman Jewels M.D.   On: 03/30/2013 13:49   Nm Pet Image Initial (pi) Skull Base To Thigh  03/30/2013   CLINICAL DATA:  Initial treatment strategy for breast cancer.  EXAM: NUCLEAR MEDICINE PET SKULL BASE TO THIGH  FASTING BLOOD GLUCOSE:  Value: $Remov'96mg'wodKwH$ /dl  TECHNIQUE: 16.9 mCi F-18 FDG was injected intravenously. CT data was obtained and used for attenuation correction and anatomic localization only. (This was not acquired as a diagnostic CT examination.) Additional exam technical data entered on technologist worksheet.  COMPARISON:  Chest CT, same date.  FINDINGS: NECK  No hypermetabolic lymph nodes in the neck.  CHEST  Right breast mass demonstrates hyper metabolism with SUV max of 5.2. No FDG positive axillary or supraclavicular lymph nodes. There are small scattered benign-appearing bilateral axillary nodes. No worrisome pulmonary nodules  are identified on the chest CT. No mediastinal or hilar lymphadenopathy.  ABDOMEN/PELVIS  No abnormal hypermetabolic activity within the liver, pancreas, adrenal glands, or spleen. No hypermetabolic lymph nodes in the abdomen or pelvis. Incidental note is made of a large anterior abdominal wall hernia containing colon and small bowel.  SKELETON  No focal hypermetabolic activity to suggest skeletal metastasis.  IMPRESSION: 1. Hypermetabolic right breast mass consistent with known breast cancer. No axillary or supraclavicular adenopathy. 2. No findings for metastatic disease involving the abdomen/pelvis, lungs or bones.   Electronically Signed   By: Kalman Jewels M.D.   On: 03/30/2013 13:29   Dg Chest Portable 1 View  04/15/2013   CLINICAL DATA:  Port-A-Cath placement.  EXAM: PORTABLE CHEST - 1 VIEW  COMPARISON:  PA and lateral chest 04/14/2013.  FINDINGS: Right IJ approach Port-A-Cath is in place. Tubing is intact. Tip of the catheter projects over the superior cavoatrial junction. Lung volumes are low but the lungs are clear. There is cardiomegaly.  IMPRESSION: Right IJ approach Port-A-Cath tip projects over the superior cavoatrial junction. Negative for pneumothorax.   Electronically Signed   By: Inge Rise M.D.   On: 04/15/2013 15:31      ASSESSMENT: 63 y.o. Denise Becker woman   (1)  status post right breast biopsy 03/04/2013 for a clinical T3 N1, stage IIIA invasive ductal carcinoma, grade 3, estrogen receptor 100% positive, progesterone receptor 100% positive, with an MIB-1 of 20% and no HER-2 amplification.  (2) right axillary lymph node biopsy 03/04/2013 was benign   (3)  to be treated in the neoadjuvant setting, the plan being to complete 6 q. three-week doses of docetaxel/doxorubicin/cyclophosphamide in the neoadjuvant setting prior to definitive surgery, first dose on 04/21/2013. Neulasta on day 2 for granulocyte support.  (4)  patient will likely need a mastectomy following  neoadjuvant chemotherapy.    PLAN: We spent over an hour together today, the majority of which was spent answering Denise Becker's questions, reviewing side effects associated with her chemotherapy, discussing antinausea medications, reviewing her treatment plan, and coordinating care.  We reviewed all of Denise Becker's recent staging scans including her PET and chest CT as well as her echocardiogram, all of which were normal and showed no evidence of metastatic disease.  Denise Becker was extremely anxious when she first arrived today, but was feeling better by the time we finished with her appointment. She is very anxious about her first infusion on Thursday, and we will add lorazepam, 0.5 mg sublingual, to be given prior to her port access.   We reviewed possible side effects, including nausea, bowel changes, hair loss, fatigue, skin and nail changes, and peripheral neuropathy. We also reviewed her antinausea regimen which will include: Dexamethasone (2 tablets by mouth twice daily with food on the day before and 3 days after each chemotherapy); prochlorperazine (10 mg by mouth with meals and at bedtime x3 days after chemotherapy, then one tablet every 6 hours as needed for nausea); and lorazepam (0.5 mg, one tablet by mouth at bedtime for anxiety/insomnia/nausea). I have not given her a prescription today for lorazepam since she has that medication on hand at home. She tells me she had it filled, but has not yet taken it. She also has a prescription for ondansetron 8 mg, but will have this filled only if she is having uncontrollable nausea. Of course she would also contact our office as well. We discussed taking Claritin, 10 mg daily for 5-7 days beginning the day of her Neulasta injection. She was also given a prescription for EMLA cream with instructions on how to utilize it prior to each port access.  She will receive her first dose of TAC as scheduled on Thursday, January 22, with Neulasta given on day 2. I will  see her next week on January 28 for assessment of chemotoxicity. We have her scheduled through early March. As we get closer, I will schedule her for repeat breast MRI in early March after the completion of 3 cycles of neoadjuvant chemotherapy to assess response. She'll continue to be followed closely by Dr. Dalbert Batman as well.  Denise Becker was given all of the above information in writing today. She was able to repeat all these instructions back to me appropriately, and voiced her understanding and agreement.  She understands that the goal of this treatment will be to her. Denise Becker will call with any changes or problems prior to her next scheduled appointment.    Rondy Krupinski, PA-C   04/19/2013 4:12 PM

## 2013-04-19 NOTE — Progress Notes (Signed)
Breast Cancer Nutrition Class Attendance Note  Date: 04/19/2013  Time: 1800  Pt attended [facility]'s Breast Cancer Nutrition Class, "Food For Your Fight". Pt was educated on basic cancer nutrition principles, including plant based diet and principles from AICR  (American Institute for Cancer Research) about latest nutrition findings and recommendations. Questions answered. Handouts and recipes provided.   Lanore Renderos A. Dorcus Riga, RD, LDN Pager: [phone]  

## 2013-04-20 ENCOUNTER — Encounter: Payer: Self-pay | Admitting: Oncology

## 2013-04-20 ENCOUNTER — Other Ambulatory Visit: Payer: Self-pay | Admitting: Oncology

## 2013-04-20 ENCOUNTER — Telehealth: Payer: Self-pay | Admitting: *Deleted

## 2013-04-20 NOTE — Telephone Encounter (Signed)
Per staff message and POF I have scheduled appts.  JMW  

## 2013-04-20 NOTE — Progress Notes (Signed)
Put fmla and disability form on nurse's desk °

## 2013-04-21 ENCOUNTER — Ambulatory Visit (HOSPITAL_BASED_OUTPATIENT_CLINIC_OR_DEPARTMENT_OTHER): Payer: 59

## 2013-04-21 ENCOUNTER — Encounter (INDEPENDENT_AMBULATORY_CARE_PROVIDER_SITE_OTHER): Payer: Self-pay

## 2013-04-21 ENCOUNTER — Encounter: Payer: Self-pay | Admitting: Oncology

## 2013-04-21 DIAGNOSIS — C50419 Malignant neoplasm of upper-outer quadrant of unspecified female breast: Secondary | ICD-10-CM

## 2013-04-21 DIAGNOSIS — Z452 Encounter for adjustment and management of vascular access device: Secondary | ICD-10-CM

## 2013-04-21 DIAGNOSIS — C50411 Malignant neoplasm of upper-outer quadrant of right female breast: Secondary | ICD-10-CM

## 2013-04-21 MED ORDER — DEXAMETHASONE SODIUM PHOSPHATE 20 MG/5ML IJ SOLN
12.0000 mg | Freq: Once | INTRAMUSCULAR | Status: AC
  Administered 2013-04-21: 12 mg via INTRAVENOUS

## 2013-04-21 MED ORDER — ALTEPLASE 2 MG IJ SOLR
2.0000 mg | Freq: Once | INTRAMUSCULAR | Status: AC | PRN
  Administered 2013-04-21: 2 mg
  Filled 2013-04-21: qty 2

## 2013-04-21 MED ORDER — FOSAPREPITANT DIMEGLUMINE INJECTION 150 MG
150.0000 mg | Freq: Once | INTRAVENOUS | Status: AC
  Administered 2013-04-21: 150 mg via INTRAVENOUS
  Filled 2013-04-21: qty 5

## 2013-04-21 MED ORDER — SODIUM CHLORIDE 0.9 % IV SOLN
500.0000 mg/m2 | Freq: Once | INTRAVENOUS | Status: DC
  Filled 2013-04-21: qty 51

## 2013-04-21 MED ORDER — SODIUM CHLORIDE 0.9 % IV SOLN
75.0000 mg/m2 | Freq: Once | INTRAVENOUS | Status: DC
  Filled 2013-04-21: qty 15

## 2013-04-21 MED ORDER — PALONOSETRON HCL INJECTION 0.25 MG/5ML
INTRAVENOUS | Status: AC
  Filled 2013-04-21: qty 5

## 2013-04-21 MED ORDER — SODIUM CHLORIDE 0.9 % IV SOLN
Freq: Once | INTRAVENOUS | Status: AC
  Administered 2013-04-21: 14:00:00 via INTRAVENOUS

## 2013-04-21 MED ORDER — DOXORUBICIN HCL CHEMO IV INJECTION 2 MG/ML
50.0000 mg/m2 | Freq: Once | INTRAVENOUS | Status: DC
  Filled 2013-04-21: qty 51

## 2013-04-21 MED ORDER — DEXAMETHASONE SODIUM PHOSPHATE 20 MG/5ML IJ SOLN
INTRAMUSCULAR | Status: AC
  Filled 2013-04-21: qty 5

## 2013-04-21 MED ORDER — LORAZEPAM 1 MG PO TABS
0.5000 mg | ORAL_TABLET | Freq: Once | ORAL | Status: AC
Start: 2013-04-21 — End: 2013-04-21
  Administered 2013-04-21: 0.5 mg via SUBLINGUAL

## 2013-04-21 MED ORDER — SODIUM CHLORIDE 0.9 % IJ SOLN
10.0000 mL | INTRAMUSCULAR | Status: DC | PRN
  Filled 2013-04-21: qty 10

## 2013-04-21 MED ORDER — LORAZEPAM 1 MG PO TABS
ORAL_TABLET | ORAL | Status: AC
  Filled 2013-04-21: qty 1

## 2013-04-21 MED ORDER — HEPARIN SOD (PORK) LOCK FLUSH 100 UNIT/ML IV SOLN
500.0000 [IU] | Freq: Once | INTRAVENOUS | Status: DC | PRN
  Filled 2013-04-21: qty 5

## 2013-04-21 MED ORDER — PALONOSETRON HCL INJECTION 0.25 MG/5ML
0.2500 mg | Freq: Once | INTRAVENOUS | Status: AC
  Administered 2013-04-21: 0.25 mg via INTRAVENOUS

## 2013-04-21 NOTE — Progress Notes (Signed)
After several unsuccessful attempts to get blood return, patient discharged home with PAC accessed and cathflo dwelling in line. Patient instructed to not remove dressing or get site wet. Patient also instructed no heavy lifting with right arm and to carry purse on left shoulder. Patient verbalized understanding of this. Patient rescheduled for chemo tomorrow at 8am. Patient given AVS and agreeable to come back tomorrow at [place], RN

## 2013-04-22 ENCOUNTER — Telehealth: Payer: Self-pay | Admitting: *Deleted

## 2013-04-22 ENCOUNTER — Encounter: Payer: Self-pay | Admitting: Pharmacist

## 2013-04-22 ENCOUNTER — Other Ambulatory Visit: Payer: Self-pay | Admitting: Oncology

## 2013-04-22 ENCOUNTER — Encounter: Payer: Self-pay | Admitting: *Deleted

## 2013-04-22 ENCOUNTER — Other Ambulatory Visit: Payer: Self-pay | Admitting: Physician Assistant

## 2013-04-22 ENCOUNTER — Ambulatory Visit (HOSPITAL_COMMUNITY)
Admission: RE | Admit: 2013-04-22 | Discharge: 2013-04-22 | Disposition: A | Discharge status: Home / Self Care | Payer: 59 | Source: Ambulatory Visit | Attending: Physician Assistant | Admitting: Physician Assistant

## 2013-04-22 ENCOUNTER — Ambulatory Visit: Payer: 59

## 2013-04-22 ENCOUNTER — Other Ambulatory Visit: Payer: Self-pay | Admitting: *Deleted

## 2013-04-22 ENCOUNTER — Telehealth (INDEPENDENT_AMBULATORY_CARE_PROVIDER_SITE_OTHER): Payer: Self-pay | Admitting: General Surgery

## 2013-04-22 ENCOUNTER — Encounter: Payer: Self-pay | Admitting: Oncology

## 2013-04-22 VITALS — BP 162/68 | HR 69 | Temp 97.6°F

## 2013-04-22 DIAGNOSIS — Z95828 Presence of other vascular implants and grafts: Secondary | ICD-10-CM

## 2013-04-22 DIAGNOSIS — T82598A Other mechanical complication of other cardiac and vascular devices and implants, initial encounter: Secondary | ICD-10-CM | POA: Insufficient documentation

## 2013-04-22 DIAGNOSIS — C50411 Malignant neoplasm of upper-outer quadrant of right female breast: Secondary | ICD-10-CM

## 2013-04-22 DIAGNOSIS — C50919 Malignant neoplasm of unspecified site of unspecified female breast: Secondary | ICD-10-CM

## 2013-04-22 DIAGNOSIS — Y849 Medical procedure, unspecified as the cause of abnormal reaction of the patient, or of later complication, without mention of misadventure at the time of the procedure: Secondary | ICD-10-CM | POA: Insufficient documentation

## 2013-04-22 DIAGNOSIS — Z853 Personal history of malignant neoplasm of breast: Secondary | ICD-10-CM | POA: Insufficient documentation

## 2013-04-22 MED ORDER — IOHEXOL 300 MG/ML  SOLN
20.0000 mL | Freq: Once | INTRAMUSCULAR | Status: AC | PRN
  Administered 2013-04-22: 20 mL

## 2013-04-22 MED ORDER — ALTEPLASE 2 MG IJ SOLR
2.0000 mg | Freq: Once | INTRAMUSCULAR | Status: DC | PRN
  Filled 2013-04-22: qty 2

## 2013-04-22 NOTE — Progress Notes (Signed)
Faxed fmla form to [employer] @ [phone]; faxed disability form to [employer] @ [phone]

## 2013-04-22 NOTE — Telephone Encounter (Signed)
This RN spoke with Pamala Hurry per her return call stating Dr Dalbert Batman is unable to revise port by next week and is ok with pt having procedure performed by IR.  This RN called IR and obtained an appointment for 04/26/2013 at 1230. Pt to be NPO post 630am And have a driver.  This RN attempted to call pt post her not returning to clinic-and obtained identified VM on both cell and home numbers. Requested a return call to this RN.

## 2013-04-22 NOTE — Telephone Encounter (Signed)
Denise Becker, at Dr. Virgie Dad office, called to report the [facility] placed by Dr. Dalbert Batman on 04/15/13, has rotated (per IR imaging today.)  Dr. Dalbert Batman contact with this information to determine if he wants to surgically revise the [facility] or have pt go to IR for repositioning.  Dr. Jana Hakim needs to start chemo ASAP.  Dr. Dalbert Batman is fine with IR procedure to get the port ready for use immediately.  Message conveyed to [facility]; they will order.

## 2013-04-22 NOTE — Progress Notes (Signed)
Patient going to IR for dye study. Cancel treatment today and delay one week per Dr. Jana Hakim.

## 2013-04-22 NOTE — Progress Notes (Signed)
This RN was informed by AB/PA of report per dye study on port and need for revision.  Per AB/PA her recommendation is to hold chemo and obtain revision as soon as possible in neoadjuvant setting and plan for 6 cycles of chemo.  This RN called to Dr Darrel Hoover office and discussed above with Pamala Hurry in the nursing office. This RN requested need for revision under Dr Dalbert Batman or his ok for IR to revise for intiation of neoadjuvant chemo.  This RN also called to the treatment room - spoke with covering RN- discussed above and requested to bring pt to this RN for discussion of situation.

## 2013-04-22 NOTE — Progress Notes (Signed)
At 8:30hr, I was called to see patient to check on her port-a-cath.  Pt. was discharged with TPA left in per Amy Berry PA's order from yesterday.  Unable to obtain blood return.  Needle adjusted again and felt needle hitting back of port-a-cath.  Area cleansed and sterile dressing reapplied.  Pt. was repositioned also without success of blood return.  1.19ml TPA removed from HN and line flushed with saline. Unable to obtain blood return post saline flushes.  Pt. denied any discomfort or burning sensation during flush process.  Amy Berry PA notified and will proceed with ordering dye study.  She will call charge nurse in infusion once this is set up.  I explained this to patient with positive feedback. Pt. Is concern about what will happen with issues with port.  Reassurance provide. HL

## 2013-04-23 ENCOUNTER — Ambulatory Visit: Payer: 59

## 2013-04-25 ENCOUNTER — Telehealth: Payer: Self-pay | Admitting: Oncology

## 2013-04-25 ENCOUNTER — Encounter (HOSPITAL_COMMUNITY): Payer: Self-pay | Admitting: Pharmacy Technician

## 2013-04-25 ENCOUNTER — Other Ambulatory Visit (INDEPENDENT_AMBULATORY_CARE_PROVIDER_SITE_OTHER): Payer: Self-pay | Admitting: General Surgery

## 2013-04-25 ENCOUNTER — Other Ambulatory Visit: Payer: Self-pay | Admitting: Radiology

## 2013-04-25 NOTE — Telephone Encounter (Signed)
, °

## 2013-04-25 NOTE — Telephone Encounter (Signed)
Dr Dalbert Batman spoke to Dr Laurence Ferrari in IR.  He would prefer Dr Dalbert Batman fix the portacath in the OR.  I notified the pt and sent the posting sheet to the schedulers to get this set up for this week.  Pt is pleased because she also would prefer Dr Dalbert Batman take care of it.

## 2013-04-26 ENCOUNTER — Other Ambulatory Visit (HOSPITAL_COMMUNITY): Payer: 59

## 2013-04-26 ENCOUNTER — Telehealth: Payer: Self-pay | Admitting: Hematology and Oncology

## 2013-04-26 ENCOUNTER — Encounter (HOSPITAL_BASED_OUTPATIENT_CLINIC_OR_DEPARTMENT_OTHER): Payer: Self-pay | Admitting: *Deleted

## 2013-04-26 ENCOUNTER — Telehealth: Payer: Self-pay | Admitting: *Deleted

## 2013-04-26 NOTE — Telephone Encounter (Signed)
S/w the pt and she is aware that her appts for 04/28/2013 has been moved to 05/03/2013 @10 :30am.

## 2013-04-26 NOTE — Progress Notes (Signed)
Pt was here 04/15/13 for pac-no new labs needed

## 2013-04-26 NOTE — Telephone Encounter (Signed)
Patient called yesterday and said Dr Dalbert Batman would be doing her PAC this week instead of IR. Called and confirmed with [facility] that patient is scheduled for revision of PAC on 04/29/2013 at [facility] [place] [facility]. We will need to move all appts out that were originally set to start this Thursday 04/28/2013. We can arrange for lab/MD visit with Dr Earnest Conroy and chemo on Tuesday 05/03/2013. Chemo plan of care dates to be changes as well as all other appts.

## 2013-04-27 ENCOUNTER — Other Ambulatory Visit: Payer: 59

## 2013-04-27 ENCOUNTER — Ambulatory Visit (HOSPITAL_COMMUNITY): Payer: 59

## 2013-04-27 ENCOUNTER — Inpatient Hospital Stay (HOSPITAL_COMMUNITY): Admission: RE | Admit: 2013-04-27 | Payer: 59 | Source: Ambulatory Visit

## 2013-04-27 ENCOUNTER — Ambulatory Visit: Admit: 2013-04-27 | Payer: Self-pay | Admitting: General Surgery

## 2013-04-27 ENCOUNTER — Ambulatory Visit: Payer: 59 | Admitting: Physician Assistant

## 2013-04-27 SURGERY — REVISION, PORT-A-CATH INSERTION
Anesthesia: General

## 2013-04-28 ENCOUNTER — Other Ambulatory Visit: Payer: 59

## 2013-04-28 ENCOUNTER — Ambulatory Visit: Payer: 59

## 2013-04-29 ENCOUNTER — Ambulatory Visit: Payer: 59

## 2013-04-29 ENCOUNTER — Encounter (HOSPITAL_BASED_OUTPATIENT_CLINIC_OR_DEPARTMENT_OTHER)
Admission: RE | Disposition: A | Discharge status: Home / Self Care | Payer: Self-pay | Source: Ambulatory Visit | Attending: General Surgery

## 2013-04-29 ENCOUNTER — Encounter (HOSPITAL_BASED_OUTPATIENT_CLINIC_OR_DEPARTMENT_OTHER): Payer: 59 | Admitting: Anesthesiology

## 2013-04-29 ENCOUNTER — Ambulatory Visit (HOSPITAL_COMMUNITY): Payer: 59

## 2013-04-29 ENCOUNTER — Encounter (HOSPITAL_BASED_OUTPATIENT_CLINIC_OR_DEPARTMENT_OTHER): Payer: Self-pay | Admitting: *Deleted

## 2013-04-29 ENCOUNTER — Ambulatory Visit (HOSPITAL_BASED_OUTPATIENT_CLINIC_OR_DEPARTMENT_OTHER): Payer: 59 | Admitting: Anesthesiology

## 2013-04-29 ENCOUNTER — Ambulatory Visit (HOSPITAL_BASED_OUTPATIENT_CLINIC_OR_DEPARTMENT_OTHER)
Admission: RE | Admit: 2013-04-29 | Discharge: 2013-04-29 | Disposition: A | Discharge status: Home / Self Care | Payer: 59 | Source: Ambulatory Visit | Attending: General Surgery | Admitting: General Surgery

## 2013-04-29 DIAGNOSIS — I10 Essential (primary) hypertension: Secondary | ICD-10-CM | POA: Insufficient documentation

## 2013-04-29 DIAGNOSIS — C50919 Malignant neoplasm of unspecified site of unspecified female breast: Secondary | ICD-10-CM | POA: Insufficient documentation

## 2013-04-29 DIAGNOSIS — Y838 Other surgical procedures as the cause of abnormal reaction of the patient, or of later complication, without mention of misadventure at the time of the procedure: Secondary | ICD-10-CM | POA: Insufficient documentation

## 2013-04-29 DIAGNOSIS — F4323 Adjustment disorder with mixed anxiety and depressed mood: Secondary | ICD-10-CM | POA: Insufficient documentation

## 2013-04-29 DIAGNOSIS — T82598A Other mechanical complication of other cardiac and vascular devices and implants, initial encounter: Secondary | ICD-10-CM

## 2013-04-29 DIAGNOSIS — C50411 Malignant neoplasm of upper-outer quadrant of right female breast: Secondary | ICD-10-CM

## 2013-04-29 DIAGNOSIS — Z87891 Personal history of nicotine dependence: Secondary | ICD-10-CM | POA: Insufficient documentation

## 2013-04-29 HISTORY — PX: PORT A CATH REVISION: SHX6033

## 2013-04-29 SURGERY — REVISION, PORT-A-CATH INSERTION
Anesthesia: General | Site: Chest | Laterality: Right

## 2013-04-29 MED ORDER — HEPARIN SOD (PORK) LOCK FLUSH 100 UNIT/ML IV SOLN
INTRAVENOUS | Status: DC | PRN
  Administered 2013-04-29: 500 [IU] via INTRAVENOUS

## 2013-04-29 MED ORDER — SODIUM CHLORIDE 0.9 % IV SOLN: INTRAVENOUS | Status: DC

## 2013-04-29 MED ORDER — CEFAZOLIN SODIUM-DEXTROSE 2-3 GM-% IV SOLR
2.0000 g | Freq: Once | INTRAVENOUS | Status: AC
  Administered 2013-04-29: 2 g via INTRAVENOUS

## 2013-04-29 MED ORDER — PROPOFOL 10 MG/ML IV BOLUS
INTRAVENOUS | Status: AC
  Filled 2013-04-29: qty 20

## 2013-04-29 MED ORDER — ONDANSETRON HCL 4 MG/2ML IJ SOLN: 4.0000 mg | Freq: Once | INTRAMUSCULAR | Status: DC | PRN

## 2013-04-29 MED ORDER — FENTANYL CITRATE 0.05 MG/ML IJ SOLN
INTRAMUSCULAR | Status: AC
  Filled 2013-04-29: qty 4

## 2013-04-29 MED ORDER — EPHEDRINE SULFATE 50 MG/ML IJ SOLN
INTRAMUSCULAR | Status: DC | PRN
  Administered 2013-04-29 (×2): 10 mg via INTRAVENOUS

## 2013-04-29 MED ORDER — FENTANYL CITRATE 0.05 MG/ML IJ SOLN: 50.0000 ug | INTRAMUSCULAR | Status: DC | PRN

## 2013-04-29 MED ORDER — FENTANYL CITRATE 0.05 MG/ML IJ SOLN
INTRAMUSCULAR | Status: DC | PRN
  Administered 2013-04-29: 100 ug via INTRAVENOUS

## 2013-04-29 MED ORDER — LIDOCAINE HCL (CARDIAC) 20 MG/ML IV SOLN
INTRAVENOUS | Status: DC | PRN
  Administered 2013-04-29: 100 mg via INTRAVENOUS

## 2013-04-29 MED ORDER — OXYCODONE HCL 5 MG/5ML PO SOLN: 5.0000 mg | Freq: Once | ORAL | Status: DC | PRN

## 2013-04-29 MED ORDER — PROPOFOL 10 MG/ML IV BOLUS
INTRAVENOUS | Status: DC | PRN
  Administered 2013-04-29: 180 mg via INTRAVENOUS

## 2013-04-29 MED ORDER — CHLORHEXIDINE GLUCONATE 4 % EX LIQD: 1.0000 "application " | Freq: Once | CUTANEOUS | Status: DC

## 2013-04-29 MED ORDER — CEFAZOLIN SODIUM-DEXTROSE 2-3 GM-% IV SOLR: 2.0000 g | INTRAVENOUS | Status: DC

## 2013-04-29 MED ORDER — HYDROMORPHONE HCL PF 1 MG/ML IJ SOLN
0.2500 mg | INTRAMUSCULAR | Status: DC | PRN
  Administered 2013-04-29 (×3): 0.25 mg via INTRAVENOUS

## 2013-04-29 MED ORDER — HYDROMORPHONE HCL PF 1 MG/ML IJ SOLN
INTRAMUSCULAR | Status: AC
  Filled 2013-04-29: qty 1

## 2013-04-29 MED ORDER — MIDAZOLAM HCL 5 MG/5ML IJ SOLN
INTRAMUSCULAR | Status: DC | PRN
  Administered 2013-04-29: 2 mg via INTRAVENOUS

## 2013-04-29 MED ORDER — DEXAMETHASONE SODIUM PHOSPHATE 4 MG/ML IJ SOLN
INTRAMUSCULAR | Status: DC | PRN
  Administered 2013-04-29: 10 mg via INTRAVENOUS

## 2013-04-29 MED ORDER — OXYCODONE HCL 5 MG PO TABS: 5.0000 mg | ORAL_TABLET | Freq: Once | ORAL | Status: DC | PRN

## 2013-04-29 MED ORDER — HEPARIN (PORCINE) IN NACL 2-0.9 UNIT/ML-% IJ SOLN
INTRAMUSCULAR | Status: DC | PRN
  Administered 2013-04-29: 1 via INTRAVENOUS

## 2013-04-29 MED ORDER — BUPIVACAINE-EPINEPHRINE 0.5% -1:200000 IJ SOLN
INTRAMUSCULAR | Status: DC | PRN
  Administered 2013-04-29: 7 mL

## 2013-04-29 MED ORDER — CEFAZOLIN SODIUM-DEXTROSE 2-3 GM-% IV SOLR
INTRAVENOUS | Status: AC
  Filled 2013-04-29: qty 50

## 2013-04-29 MED ORDER — MIDAZOLAM HCL 2 MG/2ML IJ SOLN: 1.0000 mg | INTRAMUSCULAR | Status: DC | PRN

## 2013-04-29 MED ORDER — ONDANSETRON HCL 4 MG/2ML IJ SOLN
INTRAMUSCULAR | Status: DC | PRN
  Administered 2013-04-29: 4 mg via INTRAVENOUS

## 2013-04-29 MED ORDER — LACTATED RINGERS IV SOLN
INTRAVENOUS | Status: DC
  Administered 2013-04-29: 10:00:00 via INTRAVENOUS

## 2013-04-29 MED ORDER — BUPIVACAINE-EPINEPHRINE PF 0.25-1:200000 % IJ SOLN
INTRAMUSCULAR | Status: AC
  Filled 2013-04-29: qty 30

## 2013-04-29 MED ORDER — MIDAZOLAM HCL 2 MG/2ML IJ SOLN
INTRAMUSCULAR | Status: AC
  Filled 2013-04-29: qty 2

## 2013-04-29 SURGICAL SUPPLY — 59 items
BAG DECANTER FOR FLEXI CONT (MISCELLANEOUS) ×3 IMPLANT
BENZOIN TINCTURE PRP APPL 2/3 (GAUZE/BANDAGES/DRESSINGS) IMPLANT
BLADE HEX COATED 2.75 (ELECTRODE) ×3 IMPLANT
BLADE SURG 15 STRL LF DISP TIS (BLADE) ×1 IMPLANT
BLADE SURG 15 STRL SS (BLADE) ×2
CANISTER SUCT 1200ML W/VALVE (MISCELLANEOUS) IMPLANT
CATH ROBINSON RED A/P 8FR (CATHETERS) ×3 IMPLANT
CHLORAPREP W/TINT 26ML (MISCELLANEOUS) ×3 IMPLANT
CLOSURE WOUND 1/2 X4 (GAUZE/BANDAGES/DRESSINGS)
COVER MAYO STAND STRL (DRAPES) ×3 IMPLANT
COVER PROBE 5X48 (MISCELLANEOUS)
COVER TABLE BACK 60X90 (DRAPES) ×3 IMPLANT
DECANTER SPIKE VIAL GLASS SM (MISCELLANEOUS) IMPLANT
DERMABOND ADVANCED (GAUZE/BANDAGES/DRESSINGS) ×2
DERMABOND ADVANCED .7 DNX12 (GAUZE/BANDAGES/DRESSINGS) ×1 IMPLANT
DRAPE C-ARM 42X72 X-RAY (DRAPES) ×3 IMPLANT
DRAPE LAPAROSCOPIC ABDOMINAL (DRAPES) ×3 IMPLANT
DRAPE UTILITY XL STRL (DRAPES) ×3 IMPLANT
DRSG TEGADERM 2-3/8X2-3/4 SM (GAUZE/BANDAGES/DRESSINGS) IMPLANT
DRSG TEGADERM 4X10 (GAUZE/BANDAGES/DRESSINGS) IMPLANT
DRSG TEGADERM 4X4.75 (GAUZE/BANDAGES/DRESSINGS) IMPLANT
ELECT REM PT RETURN 9FT ADLT (ELECTROSURGICAL) ×3
ELECTRODE REM PT RTRN 9FT ADLT (ELECTROSURGICAL) ×1 IMPLANT
GLOVE BIOGEL PI IND STRL 7.5 (GLOVE) ×1 IMPLANT
GLOVE BIOGEL PI INDICATOR 7.5 (GLOVE) ×2
GLOVE ECLIPSE 6.5 STRL STRAW (GLOVE) ×3 IMPLANT
GLOVE ECLIPSE 7.0 STRL STRAW (GLOVE) ×3 IMPLANT
GLOVE EUDERMIC 7 POWDERFREE (GLOVE) ×3 IMPLANT
GOWN STRL REUS W/ TWL LRG LVL3 (GOWN DISPOSABLE) ×2 IMPLANT
GOWN STRL REUS W/ TWL XL LVL3 (GOWN DISPOSABLE) ×1 IMPLANT
GOWN STRL REUS W/TWL LRG LVL3 (GOWN DISPOSABLE) ×4
GOWN STRL REUS W/TWL XL LVL3 (GOWN DISPOSABLE) ×2
IV CATH PLACEMENT UNIT 16 GA (IV SOLUTION) IMPLANT
IV KIT MINILOC 20X1 SAFETY (NEEDLE) ×3 IMPLANT
KIT BARDPORT ISP (Port) IMPLANT
KIT CVR 48X5XPRB PLUP LF (MISCELLANEOUS) IMPLANT
KIT PORT POWER 8FR ISP CVUE (Catheter) IMPLANT
KIT PORT POWER ISP 8FR (Catheter) IMPLANT
NEEDLE BLUNT 17GA (NEEDLE) IMPLANT
NEEDLE HYPO 22GX1.5 SAFETY (NEEDLE) IMPLANT
NEEDLE HYPO 25X1 1.5 SAFETY (NEEDLE) ×3 IMPLANT
PACK BASIN DAY SURGERY FS (CUSTOM PROCEDURE TRAY) ×3 IMPLANT
PENCIL BUTTON HOLSTER BLD 10FT (ELECTRODE) ×3 IMPLANT
SET SHEATH INTRODUCER 10FR (MISCELLANEOUS) IMPLANT
SHEATH COOK PEEL AWAY SET 9F (SHEATH) IMPLANT
SLEEVE SCD COMPRESS KNEE MED (MISCELLANEOUS) IMPLANT
SPONGE GAUZE 4X4 12PLY STER LF (GAUZE/BANDAGES/DRESSINGS) IMPLANT
STRIP CLOSURE SKIN 1/2X4 (GAUZE/BANDAGES/DRESSINGS) IMPLANT
SUT MNCRL AB 4-0 PS2 18 (SUTURE) ×3 IMPLANT
SUT PROLENE 2 0 CT2 30 (SUTURE) ×3 IMPLANT
SUT VICRYL 3-0 CR8 SH (SUTURE) ×3 IMPLANT
SYR 5ML LUER SLIP (SYRINGE) ×3 IMPLANT
SYR CONTROL 10ML LL (SYRINGE) ×6 IMPLANT
SYRINGE 10CC LL (SYRINGE) ×3 IMPLANT
TOWEL OR 17X24 6PK STRL BLUE (TOWEL DISPOSABLE) ×3 IMPLANT
TOWEL OR NON WOVEN STRL DISP B (DISPOSABLE) IMPLANT
TUBE CONNECTING 20'X1/4 (TUBING)
TUBE CONNECTING 20X1/4 (TUBING) IMPLANT
YANKAUER SUCT BULB TIP NO VENT (SUCTIONS) IMPLANT

## 2013-04-29 NOTE — Op Note (Signed)
Patient Name:           Denise Becker   Date of Surgery:        04/29/2013   Preop diagnosis:          Malfunction of power port due to acute kinking of catheter  Post op Diagnosis:    Same  Procedure:                 Revision of Port-A-Cath  Surgeon:                     Edsel Petrin. Dalbert Batman, M.D., FACS  Assistant:                      None   Operative Indications:   Denise Becker is a 63 y.o. female. 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 This was functioning normally in the operating room, In the postop chest x-ray looked good without any deformity of the catheter.  but when she got to the chemotherapy area they were not able to aspirate blood. This has been studied and although it can be flushed It cannot be aspirated. Imaging studies suggest that there is a kink in the catheter very near its insertion to the hub of the port. She is brought to the operating room for a revision   Operative Findings:       We found a angled cheek and the catheter about 3 cm distal to the insertion of the catheter into the hub of the port. We shortened the catheter and reattached it to straighten this out it flushed very easily and we could aspirate blood very easily without any resistance. Fluoroscopic images looked very good.  Procedure in Detail:          Following the induction of general LMA anesthesia the patient was  positioned with her arms at her sides. The neck and chest were prepped and draped in a sterile fashion. Intravenous antibiotics were given. Surgical time out was performed. 0.25% Marcaine with epinephrine was used as local infiltration anesthetic. A transverse incision was made in the right infraclavicular area and dissected down to the port. I could see the kink in the catheter. I cut all 3 Prolene sutures and straightened the catheter out and it flushed well and had good blood return. I found that I shortened the  catheter about 2.5 cm it would straighten this out. I clamped the catheter superiorly with a rubber-shod clamp. I then detached the catheter from the port and cut the catheter away from the stem of the port and amputated  about 2 cm of the catheter. I then secured the catheter back on the port with the locking device. This flushed easily and aspirated easily without resistance. I sutured the port in place on the pectoralis fascia with the stem of the port  directed cephalad. 3 Prolene sutures were used. Again this flushed easily and aspirated easily. Fluoroscopy confirmed that the catheter was well-positioned, no deformity or kinking of the catheter. The tip the catheter was in the superior vena cava. I flushed the catheter with concentrated heparin. There was no bleeding. Subcutaneous tissue closed with 3-0 Vicryl and skin closed with running subcuticular suture of 4-0 Monocryl and Dermabond. Patient tolerated the procedure well. Taken to recovery in stable condition. EBL 5 cc. Complications none. Chest x-ray is planned in PACU.  Edsel Petrin. Dalbert Batman, M.D., FACS General and Minimally Invasive Surgery Breast and Colorectal Surgery  04/29/2013 11:31 AM

## 2013-04-29 NOTE — Anesthesia Postprocedure Evaluation (Signed)
  Anesthesia Post-op Note  Patient: Denise Becker  Procedure(s) Performed: Procedure(s): PORT A CATH REVISION (Right)  Patient Location: PACU  Anesthesia Type:General  Level of Consciousness: awake, alert  and oriented  Airway and Oxygen Therapy: Patient Spontanous Breathing and Patient connected to nasal cannula oxygen  Post-op Pain: none  Post-op Assessment: Post-op Vital signs reviewed  Post-op Vital Signs: Reviewed  Complications: No apparent anesthesia complications

## 2013-04-29 NOTE — Interval H&P Note (Signed)
History and Physical Interval Note:  04/29/2013 10:20 AM  [name]  has presented today for surgery, with the diagnosis of non functioning portacath due to kink in tubing   The various methods of treatment have been discussed with the patient and family. After consideration of risks, benefits and other options for treatment, the patient has consented to  Procedure(s): PORT A CATH REVISION (N/A) as a surgical intervention .  The patient's history has been reviewed, patient examined, no change in status, stable for surgery.  I have reviewed the patient's chart and labs.  Questions were answered to the patient's satisfaction.     Adin Hector

## 2013-04-29 NOTE — H&P (Signed)
Denise Becker    MRN:  [id number]   Description: 63 year old female  Provider: Adin Hector, MD  Department: [facility]          Diagnoses      Breast cancer of upper-outer quadrant of right female breast    -  Primary      174.4      Essential hypertension, benign          401.1      Adjustment disorder with mixed anxiety and depressed mood          309.28                History and Physical   Adin Hector, MD       Status: Signed            Patient ID: Denise Becker, female   DOB: 08-Jul-1950, 63 y.o.   MRN: [id number]        HPI   Denise Becker is a 63 y.o. female.  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 This was functioning normally in the operating room but when she got to the chemotherapy area they were not able to aspirate blood. This has been studied and although it can it flushed well and aspirated this appeared to be due to working in the catheter very near its insertion to the hub of the port. She is brought to the operating room for a revision   She has never had a mammogram before. She felt a mass in her right breast for the past 3 months. Denies pain or nipple discharge. She went for imaging studies after being evaluated by Dr. Delsa Sale. Mammogram and ultrasound showed a suspicious mass in the right breast, at least 5 cm in transverse dimension with obvious nipple retraction. 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 ER. Positive at 100%, PR positive at 100%, Ki-T7 20%, HER-2 negative.    She is single. She had she had one child who is now deceased. She works as a Glass blower/designer, a moderately physical job. Denies tobacco. Drinks alcohol occasionally.     Past Medical History   Diagnosis  Date   .  Anxiety     .   Depression         history of depression after son died   .  Breast cancer of upper-outer quadrant of right female breast  03/10/2013   .  Hypertension           Past Surgical History   Procedure  Laterality  Date   .  Abdominal hysterectomy       .  Multiple tooth extractions  Bilateral           Family History   Problem  Relation  Age of Onset   .  Cancer  Mother     .  Hypertension  Mother     .  Bowel Disease  Mother     .  Lung disease  Father     .  Heart disease  Maternal Grandmother     .  Heart disease  Maternal Grandfather          Social History History   Substance Use Topics   .  Smoking status:  Former Smoker --  1.00 packs/day       Types:  Cigarettes   .  Smokeless tobacco:  Never Used   .  Alcohol Use:  Yes         Comment: occasional wine        No Known Allergies    Current Outpatient Prescriptions   Medication  Sig  Dispense  Refill   .  Amlodipine-Olmesartan (AZOR PO)  Take 1 tablet by mouth daily.         .  Multiple Vitamin (MULTIVITAMIN) capsule  Take 1 capsule by mouth daily.         .  naproxen sodium (ANAPROX) 220 MG tablet  Take 220 mg by mouth 2 (two) times daily as needed.             No current facility-administered medications for this visit.        Review of Systems Review of Systems  Constitutional: Negative for fever, chills and unexpected weight change.  HENT: Negative for congestion, hearing loss, sore throat, trouble swallowing and voice change.   Eyes: Negative for visual disturbance.  Respiratory: Negative for cough and wheezing.   Cardiovascular: Negative for chest pain, palpitations and leg swelling.  Gastrointestinal: Negative for nausea, vomiting, abdominal pain, diarrhea, constipation, blood in stool, abdominal distention and anal bleeding.  Genitourinary: Negative for hematuria, vaginal bleeding and difficulty urinating.  Musculoskeletal: Negative for arthralgias.  Skin: Negative for rash and wound.   Neurological: Negative for seizures, syncope and headaches.  Hematological: Negative for adenopathy. Does not bruise/bleed easily.  Psychiatric/Behavioral: Negative for confusion. The patient is nervous/anxious.          Physical Exam  Constitutional: She is oriented to person, place, and time. She appears well-developed and well-nourished. No distress.  HENT:   Head: Normocephalic and atraumatic.   Nose: Nose normal.   Mouth/Throat: No oropharyngeal exudate.  Eyes: Conjunctivae and EOM are normal. Pupils are equal, round, and reactive to light. Left eye exhibits no discharge. No scleral icterus.  Neck: Neck supple. No JVD present. No tracheal deviation present. No thyromegaly present.  Cardiovascular: Normal rate, regular rhythm, normal heart sounds and intact distal pulses.    No murmur heard. Pulmonary/Chest: Effort normal and breath sounds normal. No respiratory distress. She has no wheezes. She has no rales. She exhibits no tenderness.    Large mass central right breast with obvious nipple retraction. The mass feels at least 7 cm transversely and at least 5 cm vertically. Other than the nipple retraction there is no obvious skin involvement. The left breast feels normal. There is no axillary adenopathy on either side.  Abdominal: Soft. Bowel sounds are normal. She exhibits no distension and no mass. There is no tenderness. There is no rebound and no guarding.  Well healed Pfannenstiel scar.  Musculoskeletal: She exhibits no edema and no tenderness.  Lymphadenopathy:    She has no cervical adenopathy.  Neurological: She is alert and oriented to person, place, and time. She exhibits normal muscle tone. Coordination normal.  Skin: Skin is warm. No rash noted. She is not diaphoretic. No erythema. No pallor.  Psychiatric: She has a normal mood and affect. Her behavior is normal. Judgment and thought content normal.      Data Reviewed I have reviewed her imaging studies and  histology and breast diagnostic protocol. I have discussed her case in breast conference this morning. I have created a treatment plan in collaboration with Dr. Jana Hakim and Dr. Pablo Ledger.   Assessment  Malfunction of Port-A-Cath. Inability to aspirate blood, probably secondary to kink in catheter near the port. Will need revision.    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 T3, N0 clinically. There may or may not be dermal involvement under the nipple. Receptor positive, HER-2-negative.   This tumor is fairly large and I think the patient would benefit from downstaging from neoadjuvant chemotherapy. She agrees with this   Status post TAH and BSO   Anxiety and depression   Hypertension      Plan    We will schedule for Port-A-Cath revision.   Following that, Dr. Jana Hakim will supervise neoadjuvant chemotherapy   Surgical plan will ultimately be right total mastectomy and sentinel lymph node biopsy. We discussed whether or not SLN biopsy should be followed with frozen section and axillary lymph node dissection. The consensus was that since she will be getting long-term antiacid therapy and radiation therapy that it would not benefit her to get axillary lymph node dissection. So we will simply states he axilla with sentinel lymph node biopsy.   I discussed the indications, details, techniques, numerous risk of Port-A-Cath insertion with her. She is aware of the risk of pneumothorax, thrombosis, catheter embolization, infection, bleeding. She understands all these issues. All of her questions are answered. She agrees with this plan.             Edsel Petrin. Dalbert Batman, M.D., [facility] General and Minimally invasive Surgery Breast and Colorectal Surgery Office:   [phone] Pager:   [phone]

## 2013-04-29 NOTE — Anesthesia Procedure Notes (Signed)
Procedure Name: LMA Insertion Date/Time: 04/29/2013 10:49 AM Performed by: Lyndee Leo Pre-anesthesia Checklist: Patient identified, Emergency Drugs available, Suction available and Patient being monitored Patient Re-evaluated:Patient Re-evaluated prior to inductionOxygen Delivery Method: Circle System Utilized Preoxygenation: Pre-oxygenation with 100% oxygen Intubation Type: IV induction Ventilation: Mask ventilation without difficulty LMA: LMA inserted LMA Size: 4.0 Number of attempts: 1 Airway Equipment and Method: bite block Placement Confirmation: positive ETCO2 Tube secured with: Tape Dental Injury: Teeth and Oropharynx as per pre-operative assessment  Comments: Lower incisor extremely loose. Remain in place, gauze bite bock in place

## 2013-04-29 NOTE — Discharge Instructions (Addendum)
Make an appointment to see Dr. Dalbert Batman in 3 months.  [facility] the cancer [facility] and make an appointment to begin her chemotherapy  Using icepack off and on for 24 hours  He may take a shower, starting tomorrow.        PORT-A-CATH: POST OP INSTRUCTIONS  Always review your discharge instruction sheet given to you by the facility where your surgery was performed.   1. A prescription for pain medication may be given to you upon discharge. Take your pain medication as prescribed, if needed. If narcotic pain medicine is not needed, then you make take acetaminophen (Tylenol) or ibuprofen (Advil) as needed.  2. Take your usually prescribed medications unless otherwise directed. 3. If you need a refill on your pain medication, please contact our office. All narcotic pain medicine now requires a paper prescription.  Phoned in and fax refills are no longer allowed by law.  Prescriptions will not be filled after 5 pm or on weekends.  4. You should follow a light diet for the remainder of the day after your procedure. 5. Most patients will experience some mild swelling and/or bruising in the area of the incision. It may take several days to resolve. 6. It is common to experience some constipation if taking pain medication after surgery. Increasing fluid intake and taking a stool softener (such as Colace) will usually help or prevent this problem from occurring. A mild laxative (Milk of Magnesia or Miralax) should be taken according to package directions if there are no bowel movements after 48 hours.  7. Unless discharge instructions indicate otherwise, you may remove your bandages 48 hours after surgery, and you may shower at that time. You may have steri-strips (small white skin tapes) in place directly over the incision.  These strips should be left on the skin for 7-10 days.  If your surgeon used Dermabond (skin glue) on the incision, you may shower in 24 hours.  The glue will flake  off over the next 2-3 weeks.  8. If your port is left accessed at the end of surgery (needle left in port), the dressing cannot get wet and should only by changed by a healthcare professional. When the port is no longer accessed (when the needle has been removed), follow step 7.   9. ACTIVITIES:  Limit activity involving your arms for the next 72 hours. Do no strenuous exercise or activity for 1 week. You may drive when you are no longer taking prescription pain medication, you can comfortably wear a seatbelt, and you can maneuver your car. 10.You may need to see your doctor in the office for a follow-up appointment.  Please       check with your doctor.  11.When you receive a new Port-a-Cath, you will get a product guide and        ID card.  Please keep them in case you need them.  WHEN TO CALL YOUR DOCTOR [phone]): 1. Fever over 101.0 2. Chills 3. Continued bleeding from incision 4. Increased redness and tenderness at the site 5. Shortness of breath, difficulty breathing   The clinic staff is available to answer your questions during regular business hours. Please dont hesitate to call and ask to speak to one of the nurses or medical assistants for clinical concerns. If you have a medical emergency, go to the nearest emergency room or call 911.  A surgeon from [facility] is always on call at  the hospital.      Post Anesthesia Home Care Instructions  Activity: Get plenty of rest for the remainder of the day. A responsible adult should stay with you for 24 hours following the procedure.  For the next 24 hours, DO NOT: -Drive a car -Paediatric nurse -Drink alcoholic beverages -Take any medication unless instructed by your physician -Make any legal decisions or sign important papers.  Meals: Start with liquid foods such as gelatin or soup. Progress to regular foods as tolerated. Avoid greasy, spicy, heavy foods. If nausea and/or vomiting occur, drink only clear  liquids until the nausea and/or vomiting subsides. Call your physician if vomiting continues.  Special Instructions/Symptoms: Your throat may feel dry or sore from the anesthesia or the breathing tube placed in your throat during surgery. If this causes discomfort, gargle with warm salt water. The discomfort should disappear within 24 hours.

## 2013-04-29 NOTE — Transfer of Care (Signed)
Immediate Anesthesia Transfer of Care Note  Patient: Denise Becker  Procedure(s) Performed: Procedure(s): PORT A CATH REVISION (Right)  Patient Location: PACU  Anesthesia Type:General  Level of Consciousness: awake, sedated and patient cooperative  Airway & Oxygen Therapy: Patient Spontanous Breathing and Patient connected to face mask oxygen  Post-op Assessment: Report given to PACU RN and Post -op Vital signs reviewed and stable  Post vital signs: Reviewed and stable  Complications: No apparent anesthesia complications

## 2013-04-29 NOTE — Anesthesia Preprocedure Evaluation (Signed)
Anesthesia Evaluation  Patient identified by MRN, date of birth, ID band Patient awake    Reviewed: Allergy & Precautions, H&P , NPO status , Patient's Chart, lab work & pertinent test results  Airway Mallampati: I TM Distance: >3 FB Neck ROM: Full    Dental  (+) Poor Dentition, Loose and Dental Advisory Given,    Pulmonary former smoker,  breath sounds clear to auscultation        Cardiovascular hypertension, Pt. on medications Rhythm:Regular Rate:Normal     Neuro/Psych    GI/Hepatic   Endo/Other  Morbid obesity  Renal/GU      Musculoskeletal   Abdominal   Peds  Hematology   Anesthesia Other Findings   Reproductive/Obstetrics                           Anesthesia Physical Anesthesia Plan  ASA: III  Anesthesia Plan: General   Post-op Pain Management:    Induction: Intravenous  Airway Management Planned: LMA  Additional Equipment:   Intra-op Plan:   Post-operative Plan: Extubation in OR  Informed Consent: I have reviewed the patients History and Physical, chart, labs and discussed the procedure including the risks, benefits and alternatives for the proposed anesthesia with the patient or authorized representative who has indicated his/her understanding and acceptance.   Dental advisory given  Plan Discussed with: CRNA, Anesthesiologist and Surgeon  Anesthesia Plan Comments:         Anesthesia Quick Evaluation

## 2013-05-02 ENCOUNTER — Encounter (HOSPITAL_BASED_OUTPATIENT_CLINIC_OR_DEPARTMENT_OTHER): Payer: Self-pay | Admitting: General Surgery

## 2013-05-03 ENCOUNTER — Ambulatory Visit (HOSPITAL_BASED_OUTPATIENT_CLINIC_OR_DEPARTMENT_OTHER): Payer: 59 | Admitting: Hematology and Oncology

## 2013-05-03 ENCOUNTER — Other Ambulatory Visit (HOSPITAL_BASED_OUTPATIENT_CLINIC_OR_DEPARTMENT_OTHER): Payer: 59

## 2013-05-03 ENCOUNTER — Ambulatory Visit (HOSPITAL_BASED_OUTPATIENT_CLINIC_OR_DEPARTMENT_OTHER): Payer: 59

## 2013-05-03 ENCOUNTER — Encounter (INDEPENDENT_AMBULATORY_CARE_PROVIDER_SITE_OTHER): Payer: Self-pay

## 2013-05-03 VITALS — BP 135/61 | HR 73 | Temp 97.4°F | Resp 20

## 2013-05-03 VITALS — BP 145/81 | HR 67 | Temp 99.3°F | Resp 18 | Ht 60.0 in | Wt 203.0 lb

## 2013-05-03 DIAGNOSIS — C50411 Malignant neoplasm of upper-outer quadrant of right female breast: Secondary | ICD-10-CM

## 2013-05-03 DIAGNOSIS — Z5111 Encounter for antineoplastic chemotherapy: Secondary | ICD-10-CM

## 2013-05-03 DIAGNOSIS — C50419 Malignant neoplasm of upper-outer quadrant of unspecified female breast: Secondary | ICD-10-CM

## 2013-05-03 DIAGNOSIS — Z170 Estrogen receptor positive status [ER+]: Secondary | ICD-10-CM

## 2013-05-03 LAB — CBC WITH DIFFERENTIAL/PLATELET
BASO%: 0.6 % (ref 0.0–2.0)
Basophils Absolute: 0.1 10*3/uL (ref 0.0–0.1)
EOS%: 2.1 % (ref 0.0–7.0)
Eosinophils Absolute: 0.2 10*3/uL (ref 0.0–0.5)
HEMATOCRIT: 42.5 % (ref 34.8–46.6)
HEMOGLOBIN: 13.9 g/dL (ref 11.6–15.9)
LYMPH#: 2.8 10*3/uL (ref 0.9–3.3)
LYMPH%: 32.9 % (ref 14.0–49.7)
MCH: 27.7 pg (ref 25.1–34.0)
MCHC: 32.8 g/dL (ref 31.5–36.0)
MCV: 84.5 fL (ref 79.5–101.0)
MONO#: 0.8 10*3/uL (ref 0.1–0.9)
MONO%: 9.2 % (ref 0.0–14.0)
NEUT#: 4.8 10*3/uL (ref 1.5–6.5)
NEUT%: 55.2 % (ref 38.4–76.8)
Platelets: 291 10*3/uL (ref 145–400)
RBC: 5.03 10*6/uL (ref 3.70–5.45)
RDW: 14.9 % — AB (ref 11.2–14.5)
WBC: 8.7 10*3/uL (ref 3.9–10.3)

## 2013-05-03 MED ORDER — DOXORUBICIN HCL CHEMO IV INJECTION 2 MG/ML
50.0000 mg/m2 | Freq: Once | INTRAVENOUS | Status: AC
  Administered 2013-05-03: 102 mg via INTRAVENOUS
  Filled 2013-05-03: qty 51

## 2013-05-03 MED ORDER — SODIUM CHLORIDE 0.9 % IV SOLN
500.0000 mg/m2 | Freq: Once | INTRAVENOUS | Status: AC
  Administered 2013-05-03: 1020 mg via INTRAVENOUS
  Filled 2013-05-03: qty 51

## 2013-05-03 MED ORDER — DEXAMETHASONE SODIUM PHOSPHATE 20 MG/5ML IJ SOLN
INTRAMUSCULAR | Status: AC
  Filled 2013-05-03: qty 5

## 2013-05-03 MED ORDER — SODIUM CHLORIDE 0.9 % IJ SOLN
10.0000 mL | INTRAMUSCULAR | Status: DC | PRN
  Administered 2013-05-03: 10 mL
  Filled 2013-05-03: qty 10

## 2013-05-03 MED ORDER — SODIUM CHLORIDE 0.9 % IV SOLN
150.0000 mg | Freq: Once | INTRAVENOUS | Status: AC
  Administered 2013-05-03: 150 mg via INTRAVENOUS
  Filled 2013-05-03: qty 5

## 2013-05-03 MED ORDER — PALONOSETRON HCL INJECTION 0.25 MG/5ML
0.2500 mg | Freq: Once | INTRAVENOUS | Status: AC
  Administered 2013-05-03: 0.25 mg via INTRAVENOUS

## 2013-05-03 MED ORDER — SODIUM CHLORIDE 0.9 % IV SOLN
75.0000 mg/m2 | Freq: Once | INTRAVENOUS | Status: AC
  Administered 2013-05-03: 150 mg via INTRAVENOUS
  Filled 2013-05-03: qty 15

## 2013-05-03 MED ORDER — PALONOSETRON HCL INJECTION 0.25 MG/5ML
INTRAVENOUS | Status: AC
  Filled 2013-05-03: qty 5

## 2013-05-03 MED ORDER — HEPARIN SOD (PORK) LOCK FLUSH 100 UNIT/ML IV SOLN
500.0000 [IU] | Freq: Once | INTRAVENOUS | Status: AC | PRN
  Administered 2013-05-03: 500 [IU]
  Filled 2013-05-03: qty 5

## 2013-05-03 MED ORDER — OMEPRAZOLE 20 MG PO CPDR: 20.0000 mg | DELAYED_RELEASE_CAPSULE | Freq: Every day | ORAL | Status: DC

## 2013-05-03 MED ORDER — SODIUM CHLORIDE 0.9 % IV SOLN
Freq: Once | INTRAVENOUS | Status: AC
  Administered 2013-05-03: 13:00:00 via INTRAVENOUS

## 2013-05-03 MED ORDER — DEXAMETHASONE SODIUM PHOSPHATE 20 MG/5ML IJ SOLN
12.0000 mg | Freq: Once | INTRAMUSCULAR | Status: AC
  Administered 2013-05-03: 12 mg via INTRAVENOUS

## 2013-05-03 NOTE — Patient Instructions (Signed)
[facility] Discharge Instructions for Patients Receiving Chemotherapy  Today you received the following chemotherapy agents Adriamycin, Cytoxan, Taxotere.  To help prevent nausea and vomiting after your treatment, we encourage you to take your nausea medication as prescribed.    If you develop nausea and vomiting that is not controlled by your nausea medication, call the clinic.   BELOW ARE SYMPTOMS THAT SHOULD BE REPORTED IMMEDIATELY:  *FEVER GREATER THAN 100.5 F  *CHILLS WITH OR WITHOUT FEVER  NAUSEA AND VOMITING THAT IS NOT CONTROLLED WITH YOUR NAUSEA MEDICATION  *UNUSUAL SHORTNESS OF BREATH  *UNUSUAL BRUISING OR BLEEDING  TENDERNESS IN MOUTH AND THROAT WITH OR WITHOUT PRESENCE OF ULCERS  *URINARY PROBLEMS  *BOWEL PROBLEMS  UNUSUAL RASH Items with * indicate a potential emergency and should be followed up as soon as possible.  Feel free to call the clinic should you have any questions or concerns. The clinic phone number is [phone].  It was my pleasure to take care of you today!  Leeanne Rio, RN

## 2013-05-03 NOTE — Progress Notes (Signed)
ID: [name] OB: 01/30/1964  MR#: [id number]  CSN#:[id number]  PCP: Ron Parker, MD GYN:  Lahoma Crocker SU: Fanny Skates OTHER MD: Thea Silversmith  DIAGNOSIS:  Right Breast Cancer  CURRENT THERAPY:  DOCETAXEL/ADRIAMYCIN/CYTOXAN (TAC)  CHIEF COMPLAINT: For initiation of first cycle of chemotherapy  HISTORY OF PRESENT ILLNESS: As per the previously documented note: Denise Becker palpated a change in her right breast sometime in August or September 2014. She was aware that this might be significant, but waited on meds until she saw her gynecologist for a routine visit. Dr. Delsa Sale immediately he set her up for right mammography and ultrasonography performed 03/03/2013. Mammography found an area of density in the upper outer quadrant of the right breast measuring 5 cm in the with right nipple retraction. There was no evidence of skin thickening. On exam there was a firm palpable mass in the superior right breast extending from 1:00 to 9:00. The right nipple was completely retracted. There was no skin thickening. The right axilla was benign by palpation. Ultrasound showed a prominent irregular hypoechoic mass larger than the ultrasound screen. Ultrasound of the right axilla showed 2 adjacent small lymph nodes with diffusely thickened cortices, the largest measuring 1.1 cm.  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 There was no HER-2 amplification with a ratio of 1.46 by CISH and an average copy number of 2.85.  On 03/11/2013 the patient underwent bilateral breast MRI. This showed an irregular enhancing mass in the central to upper right breast with nipple retraction measuring  in total 6.8 cm. There were several adjacent enhancing nodules as well. There was no involvement of the pectoralis muscle. There were no definite morphologically abnormal right axillary lymph nodes there was no definite internal mammary or lymphadenopathy. Left breast was unremarkable.  The patient's subsequent history is as detailed below   INTERVAL HISTORY: Denise Becker is here for followup visit and for first cycle of chemotherapy for right breast CA. She was supposed to have the first  cycle of chemotherapy on 04/21/2013 since her chemoport was not working she was referred to Dr. Dalbert Batman and the Port-A-Cath revision was done on 04/29/2013. Marquis complains of gastric reflux. She denies any shortness of the chest pains, palpitations, she occasionally complains of constipation that she takes care of with  Laxatives.  She complains of mild discomfort in the chemoport area and she says that it's tolerable She feels anxiety with first chemotherapy  REVIEW OF SYSTEMS: Denise Becker  denies fevers or chills, blood in the stool blood in the urine.  She denies any nausea or vomiting, any decrease in appetite or weight loss. She denies any blurred vision headaches dizziness, tingling or numbness.  A detailed view of systems is otherwise stable and noncontributory. Pertinent Symptoms were mentioned in interval history  PAST MEDICAL HISTORY: Past Medical History  Diagnosis Date  . Anxiety   . Depression     history of depression after son died  . Breast cancer of upper-outer quadrant of right female breast 03/10/2013  . Hypertension     PAST SURGICAL HISTORY: Past Surgical History  Procedure Laterality Date  . Abdominal hysterectomy    . Multiple tooth extractions Bilateral   . Tonsillectomy    . Cesarean section    .  Portacath placement Right 04/15/2013    Procedure: INSERTION PORT-A-CATH;  Surgeon: Adin Hector, MD;  Location: [facility];  Service: General;  Laterality: Right;  .  Port a cath revision Right 04/29/2013    Procedure: PORT A CATH REVISION;  Surgeon: Adin Hector, MD;  Location: [facility];  Service: General;  Laterality: Right;    FAMILY HISTORY Family History  Problem Relation Age of Onset  . Cancer Mother   . Hypertension Mother   . Bowel Disease Mother   . Lung disease Father   . Heart disease Maternal Grandmother   . Heart disease Maternal Grandfather    the patient's mother,Denise Becker, had a lung mass but apparently died from unrelated causes (she was followed by Dr. Earlie Server here). She was 64 years old. The patient has little information about her father. The patient had 2 brothers, no sisters. There is no history of breast or ovarian cancer in the family to her knowledge.  GYNECOLOGIC HISTORY:  Menarche age 29, first live birth age 41, the patient is GX P1. She went through the change of life approximately 1999. She did not take hormone replacement.  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 She will need to be on disability throughout her treatment. She is single, lives alone with her [place]. The patient's son Denise Becker died at the age of 24 with acute leukemia. The patient has no grandchildren.    ADVANCED DIRECTIVES: Not in place, but the patient was given the appropriate documents to complete and notarize on her 03/16/2013 visit. She intends to name her cousin, Sharrie Rothman, as her healthcare power of attorney. She can be reached at [place]:  (Updated January 2064) History  Substance Use Topics  . Smoking status: Former Smoker -- 1.00 packs/day    Types: Cigarettes    Quit date: 04/12/2001  . Smokeless tobacco: Never Used  . Alcohol Use: Yes     Comment: occasional wine     Colonoscopy: Not on file  PAP: UTD/Dr. Delsa Sale  Bone density:  Never  Lipid panel:  Dec 2014/Dr.  Jackson-Moore   No Known Allergies  Current Outpatient Prescriptions  Medication Sig Dispense Refill  . amLODipine-olmesartan (AZOR) 10-40 MG per tablet Take 1 tablet by mouth every morning.      . Multiple Vitamin (MULTIVITAMIN) capsule Take 1 capsule by mouth daily.      Marland Kitchen dexamethasone (DECADRON) 4 MG tablet 2 tabs by mouth twice daily with food on day before and 3 days after each chemo dose  30 tablet  2  . dexamethasone (DECADRON) 4 MG tablet Take 4 mg by mouth 2 (two) times daily with a meal. 3 days after chemo      . HYDROcodone-acetaminophen (NORCO/VICODIN) 5-325 MG per tablet Take 1-2 tablets by mouth every 6 (six) hours as needed for moderate pain.      . hydrocortisone 2.5 % cream Apply 1 application topically 2 (two) times daily.      Marland Kitchen lidocaine-prilocaine (EMLA) cream Apply 1 application topically daily as needed. Pain port access 1-2 hours before chemo      . LORazepam (ATIVAN) 0.5 MG tablet Take 0.5 mg by mouth. Take 1 -2 tabs at night as needed for sleep      . naproxen sodium (ANAPROX) 220 MG tablet Take 220 mg by mouth 2 (two) times daily as needed.      Marland Kitchen  omeprazole (PRILOSEC) 20 MG capsule Take 1 capsule (20 mg total) by mouth daily.  30 capsule  2  . ondansetron (ZOFRAN) 8 MG tablet Take 8 mg by mouth 2 (two) times daily as needed for nausea or vomiting.      . prochlorperazine (COMPAZINE) 10 MG tablet Take 10 mg by mouth every 6 (six) hours as needed for nausea or vomiting. For 3 days after chemo       No current facility-administered medications for this visit.    OBJECTIVE: Middle-aged [place] American woman who appears slightly anxious but is in no acute distress Filed Vitals:   05/03/13 1100  BP: 145/81  Pulse: 67  Temp: 99.3 F (37.4 C)  Resp: 18     Body mass index is 39.65 kg/(m^2).    ECOG FS:0 - Asymptomatic Filed Weights   05/03/13 1100  Weight: 203 lb (92.08 kg)   Physical Exam: HEENT:  Sclerae anicteric.  Oropharynx clear and moist. Neck is  supple. Trachea midline. No thyromegaly palpated.  NODES:  No cervical or supraclavicular lymphadenopathy palpated.  BREAST EXAM:  There is a large easily palpated a mass in the upper portion of the right breast, measuring approximately 6-7 cm. There is right nipple inversion. No additional skin changes. Left breast is unremarkable. Axillae are benign bilaterally no palpable lymphadenopathy. LUNGS:  Clear to auscultation bilaterally.  No wheezes or rhonchi HEART:  Regular rate and rhythm. No murmur appreciated ABDOMEN:  Soft, nontender. No organomegaly palpated Positive bowel sounds.  MSK:  No focal spinal tenderness to palpation. Full range of motion bilaterally in the upper extremities. EXTREMITIES:  No peripheral edema.   SKIN:  Benign with no visible rashes or skin changes. No nail dyscrasia. No pallor. Port is intact in the right upper chest wall with well-healing incision. No erythema, edema, or signs of infection/cellulitis. NEURO:  Nonfocal. Well oriented.  Appropriate affect.   LAB RESULTS:   Lab Results  Component Value Date   WBC 8.7 05/03/2013   NEUTROABS 4.8 05/03/2013   HGB 13.9 05/03/2013   HCT 42.5 05/03/2013   MCV 84.5 05/03/2013   PLT 291 05/03/2013      Chemistry      Component Value Date/Time   NA 140 04/19/2013 1418   NA 141 04/04/2013 1031   K 3.8 04/19/2013 1418   K 3.6* 04/04/2013 1031   CL 103 04/04/2013 1031   CO2 25 04/19/2013 1418   CO2 27 04/04/2013 1031   BUN 11.6 04/19/2013 1418   BUN 16 04/04/2013 1031   CREATININE 0.8 04/19/2013 1418   CREATININE 0.81 04/04/2013 1031   CREATININE 0.75 03/02/2013 1135      Component Value Date/Time   CALCIUM 9.6 04/19/2013 1418   CALCIUM 9.3 04/04/2013 1031   ALKPHOS 67 04/19/2013 1418   ALKPHOS 74 04/04/2013 1031   AST 12 04/19/2013 1418   AST 16 04/04/2013 1031   ALT 16 04/19/2013 1418   ALT 23 04/04/2013 1031   BILITOT 0.30 04/19/2013 1418   BILITOT 0.3 04/04/2013 1031       STUDIES:  Echocardiogram on 03/29/2013 showed an ejection  fraction of 55-60%.    Ct Chest W Contrast  03/30/2013   CLINICAL DATA:  Breast cancer.  EXAM: CT CHEST WITH CONTRAST  TECHNIQUE: Multidetector CT imaging of the chest was performed during intravenous contrast administration.  CONTRAST:  72mL OMNIPAQUE IOHEXOL 300 MG/ML  SOLN  COMPARISON:  PET-CT, same date.  FINDINGS: The large right breast lesion is noted.  This is positive on the PET scan. There are multiple small scattered axillary and subpectoral lymph nodes but no hypermetabolism was seen on the PET scan. No enlarged internal mammary lymph nodes. The bony thorax is intact. No destructive bone lesions or spinal canal compromise.  The heart is normal in size. No pericardial effusion. No mediastinal or hilar mass or adenopathy. The esophagus is grossly normal. The aorta is normal in caliber. No dissection. Mild tortuosity.  Examination of the lung parenchyma demonstrates no worrisome pulmonary masses or nodules. No pleural effusion.  The upper abdomen is unremarkable. No findings to suggest metastatic disease.  IMPRESSION: 5 cm right breast mass without CT or PET findings to suggest axillary, supraclavicular or internal mammary disease.  No findings for pulmonary metastatic disease or bone lesions.   Electronically Signed   By: Kalman Jewels M.D.   On: 03/30/2013 13:49   Nm Pet Image Initial (pi) Skull Base To Thigh  03/30/2013   CLINICAL DATA:  Initial treatment strategy for breast cancer.  EXAM: NUCLEAR MEDICINE PET SKULL BASE TO THIGH  FASTING BLOOD GLUCOSE:  Value: $Remov'96mg'jSMqJl$ /dl  TECHNIQUE: 16.9 mCi F-18 FDG was injected intravenously. CT data was obtained and used for attenuation correction and anatomic localization only. (This was not acquired as a diagnostic CT examination.) Additional exam technical data entered on technologist worksheet.  COMPARISON:  Chest CT, same date.  FINDINGS: NECK  No hypermetabolic lymph nodes in the neck.  CHEST  Right breast mass demonstrates hyper metabolism with SUV max  of 5.2. No FDG positive axillary or supraclavicular lymph nodes. There are small scattered benign-appearing bilateral axillary nodes. No worrisome pulmonary nodules are identified on the chest CT. No mediastinal or hilar lymphadenopathy.  ABDOMEN/PELVIS  No abnormal hypermetabolic activity within the liver, pancreas, adrenal glands, or spleen. No hypermetabolic lymph nodes in the abdomen or pelvis. Incidental note is made of a large anterior abdominal wall hernia containing colon and small bowel.  SKELETON  No focal hypermetabolic activity to suggest skeletal metastasis.  IMPRESSION: 1. Hypermetabolic right breast mass consistent with known breast cancer. No axillary or supraclavicular adenopathy. 2. No findings for metastatic disease involving the abdomen/pelvis, lungs or bones.   Electronically Signed   By: Kalman Jewels M.D.   On: 03/30/2013 13:29   Dg Chest Portable 1 View  04/15/2013   CLINICAL DATA:  Port-A-Cath placement.  EXAM: PORTABLE CHEST - 1 VIEW  COMPARISON:  PA and lateral chest 04/14/2013.  FINDINGS: Right IJ approach Port-A-Cath is in place. Tubing is intact. Tip of the catheter projects over the superior cavoatrial junction. Lung volumes are low but the lungs are clear. There is cardiomegaly.  IMPRESSION: Right IJ approach Port-A-Cath tip projects over the superior cavoatrial junction. Negative for pneumothorax.   Electronically Signed   By: Inge Rise M.D.   On: 04/15/2013 15:31      ASSESSMENT: 63 y.o. [place] woman   (1)  status post right breast biopsy 03/04/2013 for a clinical T3 N1, stage IIIA invasive ductal carcinoma, grade 3, estrogen receptor 100% positive, progesterone receptor 100% positive, with an MIB-1 of 20% and no HER-2 amplification.  (2) right axillary lymph node biopsy 03/04/2013 was benign   (3) status post Port-A-Cath remission on 04/29/2013  (4) Neoadjuvant chemotherapy with docetaxel doxorubicin and cyclophosphamide - start date  on 05/03/2013 to  be repeated once every 3 weeks for 6 cycles.   (5) growth factor support with Neulasta on day 2 of each chemotherapy  (6)  patient will  likely need a mastectomy following neoadjuvant chemotherapy.    PLAN: 1. I have  reiterated the chemo benefits and side effects with Hoyle Sauer. I have answered all the questions Fumi has regarding chemotherapy.   2. I have explained the patient on importance of taking dexamethasone 8 mg by mouth twice daily starting from the day before each chemotherapy for 3 days.  3.I have prescribed omeprazole 20 mg by mouth once daily to be taken on an empty stomach for acid reflux   4.She already has the prescription filled for her for nausea,  vomiting and also ativan for her anxiety.   5.Subsequent office visits ,  chemotherapy scheduling and the need of lab work has been discussed with the patient.   6.She will receive her first dose of TAC as scheduled today with Neulasta given on day 2.   7. Repeat breast MRI  after the completion of 3 cycles of neoadjuvant chemotherapy to assess response.   8. next followup visit as scheduled on 05/12/2013 with CBC and differential   [place] understood and in agreement with the current plan of care . She knows to call us with any questions or problems prior to her next scheduled appointment.  Total time spent: 45 minutes  Wilmon Arms, MD   05/03/2013 11:40 AM

## 2013-05-03 NOTE — Progress Notes (Signed)
Positive blood return before, during and after Adriamycin IV push.

## 2013-05-04 ENCOUNTER — Telehealth: Payer: Self-pay | Admitting: *Deleted

## 2013-05-04 ENCOUNTER — Ambulatory Visit (HOSPITAL_BASED_OUTPATIENT_CLINIC_OR_DEPARTMENT_OTHER): Payer: 59

## 2013-05-04 VITALS — BP 146/64 | HR 67 | Temp 97.9°F

## 2013-05-04 DIAGNOSIS — C50419 Malignant neoplasm of upper-outer quadrant of unspecified female breast: Secondary | ICD-10-CM

## 2013-05-04 DIAGNOSIS — C50411 Malignant neoplasm of upper-outer quadrant of right female breast: Secondary | ICD-10-CM

## 2013-05-04 DIAGNOSIS — Z5189 Encounter for other specified aftercare: Secondary | ICD-10-CM

## 2013-05-04 MED ORDER — PEGFILGRASTIM INJECTION 6 MG/0.6ML
6.0000 mg | Freq: Once | SUBCUTANEOUS | Status: AC
  Administered 2013-05-04: 6 mg via SUBCUTANEOUS
  Filled 2013-05-04: qty 0.6

## 2013-05-04 NOTE — Patient Instructions (Signed)

## 2013-05-04 NOTE — Telephone Encounter (Signed)
Denise Becker here for Neulasta injection following 1st tac chemotherapy.  States that she is doing well.  No nausea, vomiting, or diarrhea.  Reviewed medication schedules.  All questions answered.  Is drinking her fluids without difficulty.  Knows to call us if she has any problems, questions or concerns.

## 2013-05-10 ENCOUNTER — Ambulatory Visit (INDEPENDENT_AMBULATORY_CARE_PROVIDER_SITE_OTHER): Payer: 59 | Admitting: General Surgery

## 2013-05-10 ENCOUNTER — Encounter (INDEPENDENT_AMBULATORY_CARE_PROVIDER_SITE_OTHER): Payer: Self-pay | Admitting: General Surgery

## 2013-05-10 VITALS — BP 112/74 | HR 80 | Temp 97.4°F | Resp 16 | Ht 61.0 in | Wt 200.4 lb

## 2013-05-10 DIAGNOSIS — C50419 Malignant neoplasm of upper-outer quadrant of unspecified female breast: Secondary | ICD-10-CM

## 2013-05-10 DIAGNOSIS — C50411 Malignant neoplasm of upper-outer quadrant of right female breast: Secondary | ICD-10-CM

## 2013-05-10 NOTE — Progress Notes (Signed)
Patient ID: Denise Becker, female   DOB: 06-Apr-1950, 63 y.o.   MRN: [id number] History: This patient in length was recently diagnosed with locally advanced cancer of the right breast, central location. She presented with obvious nipple retraction, a tumor at least 7 cm transversely and at least 5 cm vertically. There was no palpable adenopathy. Clinical stage was T3, N0, receptor positive, HER-2/neu negative. A Port-A-Cath was inserted and functioned normally in the perioperative period but then it kinked and she had to be returned to the operating room to reposition the port and take the kink out of the catheter and it has since worked fine. She has received her first cycle of chemotherapy. She has no complaints about the port She states that she's going to get 6 cycles of chemotherapy. She states that Dr. Jana Hakim is going to get an MRI mid treatment and at the end of treatment.  Exam: Patient looks good. She seems comfortable and relieved and everything seems to be going well. Lungs are clear to auscultation. Port slight right infraclavicular area looks good. No hematoma or infection. Minimal tenderness  Assessment: Invasive mammary carcinoma right breast, probable ductal phenotype, clinical stage TIII N0, receptor positive, HER-2-negative, locally advanced Status post Port-A-Cath insertion and subsequent revision Status post one of 6  cycles of neoadjuvant chemotherapy Status post TAH and BSO Hypertension Anxiety and depression  Plan: Continue chemotherapy as outlined Return to see me in 10 weeks At the end of treatment we will get into treatment MRI to assist with definitive planning. Surgical plan will ultimately be a right total mastectomy and sentinel nerve biopsy and possible axillary lymph node dissection. However.Marland KitchenMarland KitchenCurrent consensus at conference was that she would not benefit her to get axillary lymph node dissection. We will finalize this decision prior to her definitive  surgery.   Edsel Petrin. Dalbert Batman, M.D., [facility] General and Minimally invasive Surgery Breast and Colorectal Surgery Office:   [phone] Pager:   [phone]

## 2013-05-10 NOTE — Patient Instructions (Signed)
Your Port-A-Cath surgical site looks good. I am pleased that the chemotherapy has started and there have been no problems with the infusion.  Continue your preoperative chemotherapy as outlined.  Return to see Dr. Dalbert Batman in 10 weeks.

## 2013-05-12 ENCOUNTER — Other Ambulatory Visit (HOSPITAL_BASED_OUTPATIENT_CLINIC_OR_DEPARTMENT_OTHER): Payer: 59

## 2013-05-12 ENCOUNTER — Encounter: Payer: Self-pay | Admitting: Physician Assistant

## 2013-05-12 ENCOUNTER — Telehealth: Payer: Self-pay | Admitting: *Deleted

## 2013-05-12 ENCOUNTER — Telehealth: Payer: Self-pay | Admitting: Physician Assistant

## 2013-05-12 ENCOUNTER — Ambulatory Visit: Payer: 59

## 2013-05-12 ENCOUNTER — Ambulatory Visit (HOSPITAL_BASED_OUTPATIENT_CLINIC_OR_DEPARTMENT_OTHER): Payer: 59 | Admitting: Physician Assistant

## 2013-05-12 VITALS — BP 120/77 | HR 83 | Temp 98.5°F | Resp 18 | Ht 61.0 in | Wt 199.2 lb

## 2013-05-12 DIAGNOSIS — F411 Generalized anxiety disorder: Secondary | ICD-10-CM

## 2013-05-12 DIAGNOSIS — C50411 Malignant neoplasm of upper-outer quadrant of right female breast: Secondary | ICD-10-CM

## 2013-05-12 DIAGNOSIS — C50919 Malignant neoplasm of unspecified site of unspecified female breast: Secondary | ICD-10-CM

## 2013-05-12 DIAGNOSIS — I10 Essential (primary) hypertension: Secondary | ICD-10-CM

## 2013-05-12 DIAGNOSIS — C50419 Malignant neoplasm of upper-outer quadrant of unspecified female breast: Secondary | ICD-10-CM

## 2013-05-12 DIAGNOSIS — F329 Major depressive disorder, single episode, unspecified: Secondary | ICD-10-CM

## 2013-05-12 DIAGNOSIS — F3289 Other specified depressive episodes: Secondary | ICD-10-CM

## 2013-05-12 LAB — COMPREHENSIVE METABOLIC PANEL (CC13)
ALT: 14 U/L (ref 0–55)
AST: 11 U/L (ref 5–34)
Albumin: 3.2 g/dL — ABNORMAL LOW (ref 3.5–5.0)
Alkaline Phosphatase: 69 U/L (ref 40–150)
Anion Gap: 7 mEq/L (ref 3–11)
BUN: 11.7 mg/dL (ref 7.0–26.0)
CALCIUM: 9.4 mg/dL (ref 8.4–10.4)
CO2: 25 mEq/L (ref 22–29)
Chloride: 108 mEq/L (ref 98–109)
Creatinine: 0.8 mg/dL (ref 0.6–1.1)
GLUCOSE: 133 mg/dL (ref 70–140)
Potassium: 3.6 mEq/L (ref 3.5–5.1)
Sodium: 140 mEq/L (ref 136–145)
TOTAL PROTEIN: 6.6 g/dL (ref 6.4–8.3)
Total Bilirubin: <0.2 mg/dL (ref 0.20–1.20)

## 2013-05-12 LAB — CBC WITH DIFFERENTIAL/PLATELET
BASO%: 0.2 % (ref 0.0–2.0)
BASOS ABS: 0 10*3/uL (ref 0.0–0.1)
EOS ABS: 0 10*3/uL (ref 0.0–0.5)
EOS%: 0.2 % (ref 0.0–7.0)
HEMATOCRIT: 39.9 % (ref 34.8–46.6)
HEMOGLOBIN: 13.1 g/dL (ref 11.6–15.9)
LYMPH#: 2 10*3/uL (ref 0.9–3.3)
LYMPH%: 23.6 % (ref 14.0–49.7)
MCH: 27 pg (ref 25.1–34.0)
MCHC: 32.8 g/dL (ref 31.5–36.0)
MCV: 82.1 fL (ref 79.5–101.0)
MONO#: 0.8 10*3/uL (ref 0.1–0.9)
MONO%: 9.1 % (ref 0.0–14.0)
NEUT%: 66.9 % (ref 38.4–76.8)
NEUTROS ABS: 5.6 10*3/uL (ref 1.5–6.5)
Platelets: 138 10*3/uL — ABNORMAL LOW (ref 145–400)
RBC: 4.86 10*6/uL (ref 3.70–5.45)
RDW: 14.9 % — ABNORMAL HIGH (ref 11.2–14.5)
WBC: 8.3 10*3/uL (ref 3.9–10.3)
nRBC: 0 % (ref 0–0)

## 2013-05-12 NOTE — Progress Notes (Signed)
ID: [name] OB: 02/13/1951  MR#: [id number]  CSN#:[id number]  PCP: Ron Parker, MD GYN:  Lahoma Crocker SU: Fanny Skates OTHER MD: Thea Silversmith   CURRENT THERAPY: Neodjuvant chemotherapy DOCETAXEL/ADRIAMYCIN/CYTOXAN (TAC)  CHIEF COMPLAINT: Right Breast Cancer  HISTORY OF PRESENT ILLNESS: As per the previously documented note: Denise Becker palpated a change in her right breast sometime in August or September 2014. She was aware that this might be significant, but waited on meds until she saw her gynecologist for a routine visit. Dr. Delsa Sale immediately he set her up for right mammography and ultrasonography performed 03/03/2013. Mammography found an area of density in the upper outer quadrant of the right breast measuring 5 cm in the with right nipple retraction. There was no evidence of skin thickening. On exam there was a firm palpable mass in the superior right breast extending from 1:00 to 9:00. The right nipple was completely retracted. There was no skin thickening. The right axilla was benign by palpation. Ultrasound showed a prominent irregular hypoechoic mass larger than the ultrasound screen. Ultrasound of the right axilla showed 2 adjacent small lymph nodes with diffusely thickened cortices, the largest measuring 1.1 cm.  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 There was no HER-2 amplification with a ratio of 1.46 by CISH and an average copy number of 2.85.  On 03/11/2013 the patient underwent bilateral breast MRI. This showed an irregular enhancing mass in the central to upper right breast with nipple retraction measuring in total 6.8 cm. There were several  adjacent enhancing nodules as well. There was no involvement of the pectoralis muscle. There were no definite morphologically abnormal right axillary lymph nodes there was no definite internal mammary or lymphadenopathy. Left breast was unremarkable.  The patient's subsequent history is as detailed below   INTERVAL HISTORY: Denise Becker returns alone today for followup of her right breast carcinoma. She's currently day 10 cycle 1 of 6 planned q. three-week doses of adjuvant docetaxel/doxorubicin/cyclophosphamide. She receives Neulasta on day 2 for granulocyte support.  The initiation of therapy was delayed by one week secondary to a port revision which was completed on 04/29/2013. Denise Becker tells me that there were no problems whatsoever with her infusion last week, and that the port worked fine.    She tolerated the infusion well and has had few associated side effects. For the last few days she tells me her feet has "felt funny" but this seems to be improving. Is not affecting her ability to walk. She's had no signs of neuropathy in the upper extremities. She's had some occasional abdominal pain which she attributes to gas. She's having regular bowel movements, and she denies any problems with nausea or emesis. She continues on omeprazole, and her reflux has improved significantly.   REVIEW OF SYSTEMS: Denise Becker  has had no fevers, chills, or night sweats. Her energy level is still good, as is her appetite. She's had no mouth ulcers, oral sensitivity, or problems swallowing. She's had no cough, pleurisy, shortness of breath, peripheral swelling, chest pain, or palpitations. She denies any abnormal headaches, dizziness, or change in vision. She has had some lower back pain since the Neulasta injection, but denies any additional myalgias, arthralgias, or bony pain.  A detailed review of  systems is otherwise noncontributory.    PAST MEDICAL HISTORY: Past Medical History  Diagnosis Date  . Anxiety   .  Depression     history of depression after son died  . Breast cancer of upper-outer quadrant of right female breast 03/10/2013  . Hypertension     PAST SURGICAL HISTORY: Past Surgical History  Procedure Laterality Date  . Abdominal hysterectomy    . Multiple tooth extractions Bilateral   . Tonsillectomy    . Cesarean section    . Portacath placement Right 04/15/2013    Procedure: INSERTION PORT-A-CATH;  Surgeon: Adin Hector, MD;  Location: [place];  Service: General;  Laterality: Right;  . Port a cath revision Right 04/29/2013    Procedure: PORT A CATH REVISION;  Surgeon: Adin Hector, MD;  Location: [place];  Service: General;  Laterality: Right;    FAMILY HISTORY Family History  Problem Relation Age of Onset  . Cancer Mother   . Hypertension Mother   . Bowel Disease Mother   . Lung disease Father   . Heart disease Maternal Grandmother   . Heart disease Maternal Grandfather    the patient's mother,Flowrene Millings, had a lung mass but apparently died from unrelated causes (she was followed by Dr. Earlie Server here). She was 64 years old. The patient has little information about her father. The patient had 2 brothers, no sisters. There is no history of breast or ovarian cancer in the family to her knowledge.  GYNECOLOGIC HISTORY:  Menarche age 48, first live birth age 69, the patient is GX P1. She went through the change of life approximately 1999. She did not take hormone replacement.  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 She will need to be on disability throughout her treatment. She is single, lives alone with her [place]. The patient's son Denise Becker died at the age of 22 with acute leukemia. The patient has no grandchildren.    ADVANCED DIRECTIVES: Not in place, but the patient was given the appropriate documents to complete and notarize on  her 03/16/2013 visit. She intends to name her cousin, Sharrie Rothman, as her healthcare power of attorney. She can be reached at [place]:  (Updated January 2015) History  Substance Use Topics  . Smoking status: Former Smoker -- 1.00 packs/day    Types: Cigarettes    Quit date: 04/12/2001  . Smokeless tobacco: Never Used  . Alcohol Use: Yes     Comment: occasional wine     Colonoscopy: Not on file  PAP: UTD/Dr. Delsa Sale  Bone density:  Never  Lipid panel:  Dec 2014/Dr. Jackson-Moore   No Known Allergies  Current Outpatient Prescriptions  Medication Sig Dispense Refill  . amLODipine-olmesartan (AZOR) 10-40 MG per tablet Take 1 tablet by mouth every morning.      . hydrocortisone 2.5 % cream Apply 1 application topically 2 (two) times daily.      Marland Kitchen lidocaine-prilocaine (EMLA) cream Apply 1 application topically daily as needed. Pain port access 1-2 hours before chemo      . LORazepam (ATIVAN) 0.5 MG tablet Take 0.5 mg by mouth. Take 1 -2 tabs at night as needed for sleep      . Multiple Vitamin (MULTIVITAMIN) capsule Take 1 capsule by mouth daily.      . naproxen sodium (ANAPROX) 220 MG tablet Take 220 mg by mouth 2 (two) times  daily as needed.      Marland Kitchen omeprazole (PRILOSEC) 20 MG capsule Take 1 capsule (20 mg total) by mouth daily.  30 capsule  2  . dexamethasone (DECADRON) 4 MG tablet 2 tabs by mouth twice daily with food on day before and 3 days after each chemo dose  30 tablet  2  . HYDROcodone-acetaminophen (NORCO/VICODIN) 5-325 MG per tablet Take 1-2 tablets by mouth every 6 (six) hours as needed for moderate pain.      Marland Kitchen ondansetron (ZOFRAN) 8 MG tablet Take 8 mg by mouth 2 (two) times daily as needed for nausea or vomiting.      . prochlorperazine (COMPAZINE) 10 MG tablet Take 10 mg by mouth every 6 (six) hours as needed for nausea or vomiting. For 3 days after chemo       No current facility-administered medications for this visit.    OBJECTIVE:  Middle-aged [place] American woman who appears comfortable and is in no acute distress Filed Vitals:   05/12/13 1103  BP: 120/77  Pulse: 83  Temp: 98.5 F (36.9 C)  Resp: 18     Body mass index is 37.66 kg/(m^2).    ECOG FS:0 - Asymptomatic Filed Weights   05/12/13 1103  Weight: 199 lb 3.2 oz (90.357 kg)   Physical Exam: HEENT:  Sclerae anicteric.  Oropharynx clear and moist. No visible ulcerations. No evidence of oropharyngeal candidiasis. Poor dentition. Neck is supple. Trachea midline. No thyromegaly palpated.  NODES:  No cervical or supraclavicular lymphadenopathy palpated.  BREAST EXAM:  Deferred.  Axillae are benign bilaterally no palpable lymphadenopathy. LUNGS:  Clear to auscultation bilaterally with good excursion.  No wheezes or rhonchi HEART:  Regular rate and rhythm. No murmur appreciated ABDOMEN:  Soft, nontender. No organomegaly palpated Positive, normoactive bowel sounds.  MSK:  No focal spinal tenderness to palpation. Full range of motion bilaterally in the upper extremities. EXTREMITIES:  No peripheral edema.   SKIN:  Benign with no visible rashes or skin changes. No nail dyscrasia. No excessive ecchymoses, and no petechiae. Port is intact in the right upper chest wall with no erythema, edema, or signs of infection/cellulitis. NEURO:  Nonfocal. Well oriented.  Appropriate affect.   LAB RESULTS:   Lab Results  Component Value Date   WBC 8.3 05/12/2013   NEUTROABS 5.6 05/12/2013   HGB 13.1 05/12/2013   HCT 39.9 05/12/2013   MCV 82.1 05/12/2013   PLT 138* 05/12/2013      Chemistry      Component Value Date/Time   NA 140 05/12/2013 1054   NA 141 04/04/2013 1031   K 3.6 05/12/2013 1054   K 3.6* 04/04/2013 1031   CL 103 04/04/2013 1031   CO2 25 05/12/2013 1054   CO2 27 04/04/2013 1031   BUN 11.7 05/12/2013 1054   BUN 16 04/04/2013 1031   CREATININE 0.8 05/12/2013 1054   CREATININE 0.81 04/04/2013 1031   CREATININE 0.75 03/02/2013 1135      Component Value Date/Time    CALCIUM 9.4 05/12/2013 1054   CALCIUM 9.3 04/04/2013 1031   ALKPHOS 69 05/12/2013 1054   ALKPHOS 74 04/04/2013 1031   AST 11 05/12/2013 1054   AST 16 04/04/2013 1031   ALT 14 05/12/2013 1054   ALT 23 04/04/2013 1031   BILITOT <0.20 05/12/2013 1054   BILITOT 0.3 04/04/2013 1031       STUDIES:  Echocardiogram on 03/29/2013 showed an ejection fraction of 55-60%.    Dg Chest Portable 1 View 04/29/2013  CLINICAL DATA:  Breast cancer.  Port-A-Cath revision.  EXAM: PORTABLE CHEST - 1 VIEW  COMPARISON:  Chest x-ray dated 04/15/2013  FINDINGS: Port-A-Cath has been revised. The tip is at the cavoatrial junction in good position. There is mild chronic cardiomegaly. Minimal linear atelectasis at the left lung base. Lungs are otherwise clear. No pneumothorax. No osseous abnormality.  IMPRESSION: Revision of Port-A-Cath as described. Minimal atelectasis at the left lung base.   Electronically Signed   By: Rozetta Nunnery M.D.   On: 04/29/2013 12:48     ASSESSMENT: 63 y.o. [place] [address] woman   (1)  status post right breast biopsy 03/04/2013 for a clinical T3 N1, stage IIIA invasive ductal carcinoma, grade 3, estrogen receptor 100% positive, progesterone receptor 100% positive, with an MIB-1 of 20% and no HER-2 amplification.  (2) right axillary lymph node biopsy 03/04/2013 was benign   (3) status post Port-A-Cath remission on 04/29/2013  (4) Neoadjuvant chemotherapy with docetaxel doxorubicin and cyclophosphamide - start date  on 05/03/2013 to be repeated once every 3 weeks for 6 cycles.   (5) Neulasta on day 2 of each cycle for granulocyte support  (6)  patient will likely need a mastectomy following neoadjuvant chemotherapy.    PLAN: Tobin has tolerated her first cycle of neoadjuvant chemotherapy well. She'll return next week for labs and followup visit on February 19. She'll then return the following week on February 24 for her second cycle of chemotherapy.  She will see me again on March 5 for  assessment of chemotoxicity with labs and physical exam.   We'll continue to follow [facility]. We will repeat her breast MRI in late March after the completion of 3 cycles to assess response. She will be scheduled to see Dr. Dalbert Batman soon thereafter.  Of course our plan is to complete a total of 6 neoadjuvant cycles prior to her definitive surgery which will likely be a mastectomy.  Bryla voices understanding and agreement with the plan as detailed above. She also knows to call us with any changes or problems prior to her next scheduled appointment.   Micah Flesher, PA-C   05/12/2013 12:49 PM

## 2013-05-12 NOTE — Telephone Encounter (Signed)
, °

## 2013-05-12 NOTE — Telephone Encounter (Signed)
Per staff message and POF I have scheduled appts.  JMW  

## 2013-05-13 ENCOUNTER — Encounter: Payer: Self-pay | Admitting: Oncology

## 2013-05-13 ENCOUNTER — Ambulatory Visit: Payer: 59

## 2013-05-19 ENCOUNTER — Ambulatory Visit (HOSPITAL_BASED_OUTPATIENT_CLINIC_OR_DEPARTMENT_OTHER): Payer: 59 | Admitting: Hematology and Oncology

## 2013-05-19 ENCOUNTER — Encounter: Payer: Self-pay | Admitting: Hematology and Oncology

## 2013-05-19 ENCOUNTER — Other Ambulatory Visit (HOSPITAL_BASED_OUTPATIENT_CLINIC_OR_DEPARTMENT_OTHER): Payer: 59

## 2013-05-19 ENCOUNTER — Ambulatory Visit: Payer: 59

## 2013-05-19 VITALS — BP 134/82 | HR 71 | Temp 97.7°F | Resp 18 | Ht 61.0 in | Wt 205.0 lb

## 2013-05-19 DIAGNOSIS — C50411 Malignant neoplasm of upper-outer quadrant of right female breast: Secondary | ICD-10-CM

## 2013-05-19 DIAGNOSIS — C50919 Malignant neoplasm of unspecified site of unspecified female breast: Secondary | ICD-10-CM

## 2013-05-19 DIAGNOSIS — Z170 Estrogen receptor positive status [ER+]: Secondary | ICD-10-CM

## 2013-05-19 DIAGNOSIS — M25519 Pain in unspecified shoulder: Secondary | ICD-10-CM

## 2013-05-19 LAB — CBC WITH DIFFERENTIAL/PLATELET
BASO%: 1.2 % (ref 0.0–2.0)
Basophils Absolute: 0.1 10*3/uL (ref 0.0–0.1)
EOS ABS: 0.1 10*3/uL (ref 0.0–0.5)
EOS%: 0.8 % (ref 0.0–7.0)
HCT: 40.5 % (ref 34.8–46.6)
HGB: 13 g/dL (ref 11.6–15.9)
LYMPH#: 2 10*3/uL (ref 0.9–3.3)
LYMPH%: 23.2 % (ref 14.0–49.7)
MCH: 27 pg (ref 25.1–34.0)
MCHC: 32.2 g/dL (ref 31.5–36.0)
MCV: 84 fL (ref 79.5–101.0)
MONO#: 0.9 10*3/uL (ref 0.1–0.9)
MONO%: 10.7 % (ref 0.0–14.0)
NEUT%: 64.1 % (ref 38.4–76.8)
NEUTROS ABS: 5.4 10*3/uL (ref 1.5–6.5)
Platelets: 324 10*3/uL (ref 145–400)
RBC: 4.82 10*6/uL (ref 3.70–5.45)
RDW: 15.4 % — ABNORMAL HIGH (ref 11.2–14.5)
WBC: 8.4 10*3/uL (ref 3.9–10.3)

## 2013-05-19 LAB — COMPREHENSIVE METABOLIC PANEL (CC13)
ALBUMIN: 3.6 g/dL (ref 3.5–5.0)
ALT: 23 U/L (ref 0–55)
AST: 11 U/L (ref 5–34)
Alkaline Phosphatase: 72 U/L (ref 40–150)
Anion Gap: 9 mEq/L (ref 3–11)
BUN: 15.5 mg/dL (ref 7.0–26.0)
CHLORIDE: 110 meq/L — AB (ref 98–109)
CO2: 26 meq/L (ref 22–29)
Calcium: 10 mg/dL (ref 8.4–10.4)
Creatinine: 1 mg/dL (ref 0.6–1.1)
Glucose: 87 mg/dl (ref 70–140)
POTASSIUM: 3.7 meq/L (ref 3.5–5.1)
SODIUM: 145 meq/L (ref 136–145)
Total Protein: 7.4 g/dL (ref 6.4–8.3)

## 2013-05-19 NOTE — Progress Notes (Signed)
ID: [name] OB: 1951-01-26  MR#: [id number]  CSN#:[id number]  PCP: Ron Parker, MD GYN:  Lahoma Crocker SU: Fanny Skates OTHER MD: Thea Silversmith   CURRENT THERAPY: Neodjuvant chemotherapy DOCETAXEL/ADRIAMYCIN/CYTOXAN (TAC)  CHIEF COMPLAINT: Follow up visit after Chemotherapy  DIAGNOSIS:  Right Breast Cancer  HISTORY OF PRESENT ILLNESS: As per the previously documented note: Annemarie palpated a change in her right breast sometime in August or September 2014. She was aware that this might be significant, but waited on meds until she saw her gynecologist for a routine visit. Dr. Delsa Sale immediately he set her up for right mammography and ultrasonography performed 03/03/2013. Mammography found an area of density in the upper outer quadrant of the right breast measuring 5 cm in the with right nipple retraction. There was no evidence of skin thickening. On exam there was a firm palpable mass in the superior right breast extending from 1:00 to 9:00. The right nipple was completely retracted. There was no skin thickening. The right axilla was benign by palpation. Ultrasound showed a prominent irregular hypoechoic mass larger than the ultrasound screen. Ultrasound of the right axilla showed 2 adjacent small lymph nodes with diffusely thickened cortices, the largest measuring 1.1 cm.  On 03/04/2013 the patient underwent biopsy of the right breast mass and low right axillary lymph node, with the pathology ([id number]) showing, in the breast, and invasive ductal carcinoma, grade 3, 100% estrogen receptor positive, and her percent progesterone receptor positive, both with strong staining intensity, and a proliferation marker of 20%. There was no HER-2 amplification with a ratio of 1.46 by CISH and an average copy number of 2.85.  On 03/11/2013 the patient underwent bilateral breast MRI. This showed an irregular enhancing mass in the central to upper right breast with nipple  retraction measuring in total 6.8 cm. There were several adjacent enhancing nodules as well. There was no involvement of the pectoralis muscle. There were no definite morphologically abnormal right axillary lymph nodes there was no definite internal mammary or lymphadenopathy. Left breast was unremarkable.  The patient's subsequent history is as detailed below   INTERVAL HISTORY: Gricel returns today for followup visit after first cycle of  TAC chemotherapy for her right breast cancer. She complains of runny nose that started 3 days ago and she took NyQuil got relief. She is due to receive her second cycle of chemotherapy with TAC on 05/24/2013.She receives Neulasta on day 2 for granulocyte support. She took claritin and Aleve after her Neulasta injection. She noticed a right shoulder pain that she attributes to shoulder dislocation happened couple of times in the past. Her pain gets better if she takes Aleve.  She says that she's eating well and her appetite is increased recently. She does not complain of any fever or, shortness of breath, chills, sinus congestion, blood in the stool, blood in the urine, headaches blurred vision, dizziness  REVIEW OF SYSTEMS: A 14 point review of systems is been assessed and pertinent symptoms are mentioned in interval history  PAST MEDICAL HISTORY: Past Medical History  Diagnosis Date  . Anxiety   . Depression     history of depression after son died  . Breast cancer of upper-outer quadrant of right female breast 03/10/2013  . Hypertension     PAST SURGICAL HISTORY: Past Surgical History  Procedure Laterality Date  . Abdominal hysterectomy    . Multiple tooth extractions Bilateral   . Tonsillectomy    . Cesarean section    . Portacath placement Right  04/15/2013    Procedure: INSERTION PORT-A-CATH;  Surgeon: Adin Hector, MD;  Location: [place];  Service: General;  Laterality: Right;  . Port a cath revision Right 04/29/2013     Procedure: PORT A CATH REVISION;  Surgeon: Adin Hector, MD;  Location: [place];  Service: General;  Laterality: Right;    FAMILY HISTORY Family History  Problem Relation Age of Onset  . Cancer Mother   . Hypertension Mother   . Bowel Disease Mother   . Lung disease Father   . Heart disease Maternal Grandmother   . Heart disease Maternal Grandfather    the patient's mother,Flowrene Millings, had a lung mass but apparently died from unrelated causes (she was followed by Dr. Earlie Server here). She was 64 years old. The patient has little information about her father. The patient had 2 brothers, no sisters. There is no history of breast or ovarian cancer in the family to her knowledge.  GYNECOLOGIC HISTORY:  Menarche age 23, first live birth age 35, the patient is GX P1. She went through the change of life approximately 1999. She did not take hormone replacement.  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 She will need to be on disability throughout her treatment. She is single, lives alone with her [place]. The patient's son Helen Hashimoto died at the age of 38 with acute leukemia. The patient has no grandchildren.    ADVANCED DIRECTIVES: Not in place, but the patient was given the appropriate documents to complete and notarize on her 03/16/2013 visit. She intends to name her cousin, Sharrie Rothman, as her healthcare power of attorney. She can be reached at [place]:  (Updated January 2015) History  Substance Use Topics  . Smoking status: Former Smoker -- 1.00 packs/day    Types: Cigarettes    Quit date: 04/12/2001  . Smokeless tobacco: Never Used  . Alcohol Use: Yes     Comment: occasional wine     Colonoscopy: Not on file  PAP: UTD/Dr. Delsa Sale  Bone density:  Never  Lipid panel:  Dec 2014/Dr. Jackson-Moore   No Known Allergies  Current Outpatient  Prescriptions  Medication Sig Dispense Refill  . amLODipine-olmesartan (AZOR) 10-40 MG per tablet Take 1 tablet by mouth every morning.      Marland Kitchen HYDROcodone-acetaminophen (NORCO/VICODIN) 5-325 MG per tablet Take 1-2 tablets by mouth every 6 (six) hours as needed for moderate pain.      . hydrocortisone 2.5 % cream Apply 1 application topically 2 (two) times daily.      Marland Kitchen lidocaine-prilocaine (EMLA) cream Apply 1 application topically daily as needed. Pain port access 1-2 hours before chemo      . Multiple Vitamin (MULTIVITAMIN) capsule Take 1 capsule by mouth daily.      Marland Kitchen omeprazole (PRILOSEC) 20 MG capsule Take 1 capsule (20 mg total) by mouth daily.  30 capsule  2  . dexamethasone (DECADRON) 4 MG tablet 2 tabs by mouth twice daily with food on day before and 3 days after each chemo dose  30 tablet  2  . LORazepam (ATIVAN) 0.5 MG tablet Take 0.5 mg by mouth. Take 1 -2 tabs at night as needed for sleep      . naproxen sodium (ANAPROX) 220 MG tablet Take 220 mg by mouth 2 (two) times daily as needed.      . ondansetron (ZOFRAN) 8 MG tablet  Take 8 mg by mouth 2 (two) times daily as needed for nausea or vomiting.      . prochlorperazine (COMPAZINE) 10 MG tablet Take 10 mg by mouth every 6 (six) hours as needed for nausea or vomiting. For 3 days after chemo       No current facility-administered medications for this visit.    OBJECTIVE: Middle-aged [place] American woman who appears comfortable and is in no acute distress Filed Vitals:   05/19/13 1129  BP: 134/82  Pulse: 71  Temp: 97.7 F (36.5 C)  Resp: 18     Body mass index is 38.75 kg/(m^2).    ECOG FS:0 - Asymptomatic Filed Weights   05/19/13 1129  Weight: 205 lb (92.987 kg)   Physical Exam: HEENT:  Sclerae anicteric, conjunctiva no pallor, neck supple, no JVD, no thyromegaly  Oropharynx clear and moist. No visible ulcerations. No evidence of oropharyngeal candidiasis. Poor dentition. Neck is supple. Trachea midline. No thyromegaly  palpated.  NODES:  No cervical or supraclavicular lymphadenopathy palpated.  BREAST EXAM:  Right breast inverted nipple and right breast mass felt.  Axillae are benign bilaterally no palpable lymphadenopathy. LUNGS:  Clear to auscultation bilaterally with good excursion.  No wheezes or rhonchi HEART:  Regular rate and rhythm. No murmur appreciated ABDOMEN:  Soft, nontender. No organomegaly palpated Positive, normoactive bowel sounds.  MSK:  No focal spinal tenderness to palpation. Full range of motion bilaterally in the upper extremities. EXTREMITIES:  No peripheral edema, no cyanosis no clubbing   SKIN:  Benign with no visible rashes or skin changes. No nail dyscrasia. No excessive ecchymoses, and no petechiae. Port is intact in the right upper chest wall with no erythema, edema, or signs of infection/cellulitis. NEURO:  Nonfocal. Well oriented.  Appropriate affect.   LAB RESULTS:   Lab Results  Component Value Date   WBC 8.4 05/19/2013   NEUTROABS 5.4 05/19/2013   HGB 13.0 05/19/2013   HCT 40.5 05/19/2013   MCV 84.0 05/19/2013   PLT 324 05/19/2013      Chemistry      Component Value Date/Time   NA 145 05/19/2013 1003   NA 141 04/04/2013 1031   K 3.7 05/19/2013 1003   K 3.6* 04/04/2013 1031   CL 103 04/04/2013 1031   CO2 26 05/19/2013 1003   CO2 27 04/04/2013 1031   BUN 15.5 05/19/2013 1003   BUN 16 04/04/2013 1031   CREATININE 1.0 05/19/2013 1003   CREATININE 0.81 04/04/2013 1031   CREATININE 0.75 03/02/2013 1135      Component Value Date/Time   CALCIUM 10.0 05/19/2013 1003   CALCIUM 9.3 04/04/2013 1031   ALKPHOS 72 05/19/2013 1003   ALKPHOS 74 04/04/2013 1031   AST 11 05/19/2013 1003   AST 16 04/04/2013 1031   ALT 23 05/19/2013 1003   ALT 23 04/04/2013 1031   BILITOT <0.20 05/19/2013 1003   BILITOT 0.3 04/04/2013 1031       STUDIES:  Echocardiogram on 03/29/2013 showed an ejection fraction of 55-60%.    Dg Chest Portable 1 View 04/29/2013   CLINICAL DATA:  Breast cancer.  Port-A-Cath  revision.  EXAM: PORTABLE CHEST - 1 VIEW  COMPARISON:  Chest x-ray dated 04/15/2013  FINDINGS: Port-A-Cath has been revised. The tip is at the cavoatrial junction in good position. There is mild chronic cardiomegaly. Minimal linear atelectasis at the left lung base. Lungs are otherwise clear. No pneumothorax. No osseous abnormality.  IMPRESSION: Revision of Port-A-Cath as described. Minimal atelectasis at the  left lung base.   Electronically Signed   By: Rozetta Nunnery M.D.   On: 04/29/2013 12:48     ASSESSMENT: 63 y.o. [place] woman   (1)  status post right breast biopsy 03/04/2013 for a clinical T3 N1, stage IIIA invasive ductal carcinoma, grade 3, estrogen receptor 100% positive, progesterone receptor 100% positive, with an MIB-1 of 20% and no HER-2 amplification.  (2) right axillary lymph node biopsy 03/04/2013 was benign   (3) status post Port-A-Cath remission on 04/29/2013  (4) Neoadjuvant chemotherapy with docetaxel doxorubicin and cyclophosphamide - start date  on 05/03/2013 to be repeated once every 3 weeks for 6 cycles.   (5) Neulasta on day 2 of each cycle for granulocyte support  (6)  patient will likely need a mastectomy following neoadjuvant chemotherapy.  (7)Heart burn controlled with omeprazole  (8) history of right shoulder dislocation with the intermittent right shoulder pain    PLAN: 1.Taurus has tolerated her first cycle of chemotherapy with TAC. She received Neulasta therapy the day after her chemotherapy. I have reviewed her CBC and CMP today.  2. I have encouraged her to drink more fluids and discuss the diet plan with her  3. She is scheduled to have the second cycle of chemotherapy with TAC on 05/24/2013 with CBC and CMP  4. If her right shoulder pain gets worse, we'll refer the patient to orthopedics for further evaluation and management  Nupur voices understanding and agreement with the plan current plan of care. She also knows to call us with any  changes or problems prior to her next scheduled appointment.   Wilmon Arms, MD  Medical oncology   05/19/2013 12:13 PM

## 2013-05-20 ENCOUNTER — Ambulatory Visit: Payer: 59

## 2013-05-20 ENCOUNTER — Ambulatory Visit: Payer: 59 | Admitting: Physician Assistant

## 2013-05-20 ENCOUNTER — Other Ambulatory Visit: Payer: 59

## 2013-05-24 ENCOUNTER — Other Ambulatory Visit: Payer: 59

## 2013-05-24 ENCOUNTER — Telehealth: Payer: Self-pay

## 2013-05-24 ENCOUNTER — Ambulatory Visit: Payer: 59

## 2013-05-24 NOTE — Telephone Encounter (Signed)
Pt needs to reschedule due to weather.  POF sent to scheduling.  Pt advised scheduling would contact her with new appts.  Pt verbalized understanding.

## 2013-05-25 ENCOUNTER — Ambulatory Visit: Payer: 59

## 2013-05-25 ENCOUNTER — Telehealth: Payer: Self-pay | Admitting: Oncology

## 2013-05-25 ENCOUNTER — Ambulatory Visit: Payer: 59 | Admitting: Physician Assistant

## 2013-05-25 ENCOUNTER — Other Ambulatory Visit: Payer: 59

## 2013-05-25 NOTE — Telephone Encounter (Signed)
, °

## 2013-05-26 ENCOUNTER — Telehealth: Payer: Self-pay | Admitting: *Deleted

## 2013-05-26 NOTE — Telephone Encounter (Signed)
Per staff message and POF I have scheduled appts.  JMW  

## 2013-06-02 ENCOUNTER — Ambulatory Visit: Payer: 59 | Admitting: Physician Assistant

## 2013-06-02 ENCOUNTER — Ambulatory Visit (HOSPITAL_BASED_OUTPATIENT_CLINIC_OR_DEPARTMENT_OTHER): Payer: 59 | Admitting: Physician Assistant

## 2013-06-02 ENCOUNTER — Telehealth: Payer: Self-pay | Admitting: *Deleted

## 2013-06-02 ENCOUNTER — Telehealth: Payer: Self-pay | Admitting: Physician Assistant

## 2013-06-02 ENCOUNTER — Ambulatory Visit (HOSPITAL_BASED_OUTPATIENT_CLINIC_OR_DEPARTMENT_OTHER): Payer: 59

## 2013-06-02 ENCOUNTER — Ambulatory Visit (HOSPITAL_COMMUNITY)
Admission: RE | Admit: 2013-06-02 | Discharge: 2013-06-02 | Disposition: A | Discharge status: Home / Self Care | Payer: 59 | Source: Ambulatory Visit | Attending: Oncology | Admitting: Oncology

## 2013-06-02 ENCOUNTER — Encounter: Payer: Self-pay | Admitting: Physician Assistant

## 2013-06-02 ENCOUNTER — Ambulatory Visit: Payer: 59

## 2013-06-02 ENCOUNTER — Other Ambulatory Visit (HOSPITAL_BASED_OUTPATIENT_CLINIC_OR_DEPARTMENT_OTHER): Payer: 59

## 2013-06-02 ENCOUNTER — Other Ambulatory Visit: Payer: 59

## 2013-06-02 VITALS — BP 151/85 | HR 89 | Temp 98.5°F | Resp 18 | Ht 61.0 in | Wt 210.2 lb

## 2013-06-02 DIAGNOSIS — R609 Edema, unspecified: Secondary | ICD-10-CM

## 2013-06-02 DIAGNOSIS — C50411 Malignant neoplasm of upper-outer quadrant of right female breast: Secondary | ICD-10-CM

## 2013-06-02 DIAGNOSIS — C50419 Malignant neoplasm of upper-outer quadrant of unspecified female breast: Secondary | ICD-10-CM

## 2013-06-02 DIAGNOSIS — Z170 Estrogen receptor positive status [ER+]: Secondary | ICD-10-CM

## 2013-06-02 DIAGNOSIS — Z5111 Encounter for antineoplastic chemotherapy: Secondary | ICD-10-CM

## 2013-06-02 DIAGNOSIS — M7989 Other specified soft tissue disorders: Secondary | ICD-10-CM

## 2013-06-02 DIAGNOSIS — R600 Localized edema: Secondary | ICD-10-CM

## 2013-06-02 LAB — CBC WITH DIFFERENTIAL/PLATELET
BASO%: 0.1 % (ref 0.0–2.0)
Basophils Absolute: 0 10*3/uL (ref 0.0–0.1)
EOS%: 0.1 % (ref 0.0–7.0)
Eosinophils Absolute: 0 10*3/uL (ref 0.0–0.5)
HEMATOCRIT: 38.3 % (ref 34.8–46.6)
HGB: 12.6 g/dL (ref 11.6–15.9)
LYMPH%: 10.8 % — AB (ref 14.0–49.7)
MCH: 27 pg (ref 25.1–34.0)
MCHC: 32.9 g/dL (ref 31.5–36.0)
MCV: 82 fL (ref 79.5–101.0)
MONO#: 2.3 10*3/uL — ABNORMAL HIGH (ref 0.1–0.9)
MONO%: 11.7 % (ref 0.0–14.0)
NEUT#: 15.4 10*3/uL — ABNORMAL HIGH (ref 1.5–6.5)
NEUT%: 77.3 % — AB (ref 38.4–76.8)
PLATELETS: 421 10*3/uL — AB (ref 145–400)
RBC: 4.67 10*6/uL (ref 3.70–5.45)
RDW: 16 % — ABNORMAL HIGH (ref 11.2–14.5)
WBC: 19.9 10*3/uL — ABNORMAL HIGH (ref 3.9–10.3)
lymph#: 2.2 10*3/uL (ref 0.9–3.3)

## 2013-06-02 MED ORDER — DEXAMETHASONE SODIUM PHOSPHATE 20 MG/5ML IJ SOLN
INTRAMUSCULAR | Status: AC
  Filled 2013-06-02: qty 5

## 2013-06-02 MED ORDER — SODIUM CHLORIDE 0.9 % IV SOLN
150.0000 mg | Freq: Once | INTRAVENOUS | Status: AC
  Administered 2013-06-02: 150 mg via INTRAVENOUS
  Filled 2013-06-02: qty 5

## 2013-06-02 MED ORDER — SODIUM CHLORIDE 0.9 % IV SOLN
Freq: Once | INTRAVENOUS | Status: AC
  Administered 2013-06-02: 16:00:00 via INTRAVENOUS

## 2013-06-02 MED ORDER — SODIUM CHLORIDE 0.9 % IJ SOLN
10.0000 mL | INTRAMUSCULAR | Status: DC | PRN
  Administered 2013-06-02: 10 mL
  Filled 2013-06-02: qty 10

## 2013-06-02 MED ORDER — DOXORUBICIN HCL CHEMO IV INJECTION 2 MG/ML
50.0000 mg/m2 | Freq: Once | INTRAVENOUS | Status: AC
  Administered 2013-06-02: 102 mg via INTRAVENOUS
  Filled 2013-06-02: qty 51

## 2013-06-02 MED ORDER — SODIUM CHLORIDE 0.9 % IV SOLN
75.0000 mg/m2 | Freq: Once | INTRAVENOUS | Status: AC
  Administered 2013-06-02: 150 mg via INTRAVENOUS
  Filled 2013-06-02: qty 15

## 2013-06-02 MED ORDER — PALONOSETRON HCL INJECTION 0.25 MG/5ML
0.2500 mg | Freq: Once | INTRAVENOUS | Status: AC
  Administered 2013-06-02: 0.25 mg via INTRAVENOUS

## 2013-06-02 MED ORDER — SODIUM CHLORIDE 0.9 % IV SOLN
500.0000 mg/m2 | Freq: Once | INTRAVENOUS | Status: AC
  Administered 2013-06-02: 1020 mg via INTRAVENOUS
  Filled 2013-06-02: qty 51

## 2013-06-02 MED ORDER — DEXAMETHASONE SODIUM PHOSPHATE 20 MG/5ML IJ SOLN
12.0000 mg | Freq: Once | INTRAMUSCULAR | Status: AC
  Administered 2013-06-02: 12 mg via INTRAVENOUS

## 2013-06-02 MED ORDER — PALONOSETRON HCL INJECTION 0.25 MG/5ML
INTRAVENOUS | Status: AC
  Filled 2013-06-02: qty 5

## 2013-06-02 MED ORDER — HEPARIN SOD (PORK) LOCK FLUSH 100 UNIT/ML IV SOLN
500.0000 [IU] | Freq: Once | INTRAVENOUS | Status: AC | PRN
  Administered 2013-06-02: 500 [IU]
  Filled 2013-06-02: qty 5

## 2013-06-02 NOTE — Telephone Encounter (Signed)
Per staff message and POF I have scheduled appts.  JMW  

## 2013-06-02 NOTE — Telephone Encounter (Signed)
, °

## 2013-06-02 NOTE — Progress Notes (Signed)
ID: [name] OB: December 31, 1950  MR#: [id number]  CSN#:[id number]  PCP: Ron Parker, MD GYN:  Lahoma Crocker SU: Fanny Skates OTHER MD: Thea Silversmith   CURRENT THERAPY: Neodjuvant chemotherapy DOCETAXEL/ADRIAMYCIN/CYTOXAN (TAC)  CHIEF COMPLAINT: Right breast cancer/neoadjuvant chemotherapy  HISTORY OF PRESENT ILLNESS: As per the previously documented note: Olla palpated a change in her right breast sometime in August or September 2014. She was aware that this might be significant, but waited on meds until she saw her gynecologist for a routine visit. Dr. Delsa Sale immediately he set her up for right mammography and ultrasonography performed 03/03/2013. Mammography found an area of density in the upper outer quadrant of the right breast measuring 5 cm in the with right nipple retraction. There was no evidence of skin thickening. On exam there was a firm palpable mass in the superior right breast extending from 1:00 to 9:00. The right nipple was completely retracted. There was no skin thickening. The right axilla was benign by palpation. Ultrasound showed a prominent irregular hypoechoic mass larger than the ultrasound screen. Ultrasound of the right axilla showed 2 adjacent small lymph nodes with diffusely thickened cortices, the largest measuring 1.1 cm.  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 There was no HER-2 amplification with a ratio of 1.46 by CISH and an average copy number of 2.85.  On 03/11/2013 the patient underwent bilateral breast MRI. This showed an irregular enhancing mass in the central to upper right breast with nipple retraction measuring in total 6.8  cm. There were several adjacent enhancing nodules as well. There was no involvement of the pectoralis muscle. There were no definite morphologically abnormal right axillary lymph nodes there was no definite internal mammary or lymphadenopathy. Left breast was unremarkable.  The patient's subsequent history is as detailed below   INTERVAL HISTORY: Dorlene returns alone today for followup of her locally advanced right breast carcinoma. She is due for day 1 cycle 2 of docetaxel/doxorubicin/cyclophosphamide being given in the neoadjuvant setting. She receives Neulasta on day 2 for granulocyte support.  Overall, Salimatou is feeling well. She does have a few concerns today discussed today. She started noticing increased swelling in her lower extremities. While both legs are swelling, her right lower leg is swelling more significantly. She denies any recent injuries.   She has had some intermittent numbness and tingling in her feet, but this seems to come and go, and is currently not present. She's also had increased hyperpigmentation of the skin. Her nails are dark or, but her nontender. She also lost her hair following her last appointment here. She continues to have some pain in the right shoulder which is stable and is treated well with Aleve.    REVIEW OF SYSTEMS: Otherwise, Janeshia denies any recent fevers, chills, night sweats, or hot flashes. She's had no abnormal bruising or bleeding. She's had no mouth ulcers or oral sensitivity and denies any problems with nausea, emesis, or change in bowel or bladder habits. Her appetite has actually increased. She denies any increased cough or shortness of breath, and has had no chest pain or palpitations. She denies any abnormal headaches or dizziness. Other than the shoulder as noted above, she's had no additional myalgias, arthralgias, or bony pain.  A  detailed review of systems is otherwise stable and noncontributory.    PAST MEDICAL HISTORY: Past  Medical History  Diagnosis Date  . Anxiety   . Depression     history of depression after son died  . Breast cancer of upper-outer quadrant of right female breast 03/10/2013  . Hypertension     PAST SURGICAL HISTORY: Past Surgical History  Procedure Laterality Date  . Abdominal hysterectomy    . Multiple tooth extractions Bilateral   . Tonsillectomy    . Cesarean section    . Portacath placement Right 04/15/2013    Procedure: INSERTION PORT-A-CATH;  Surgeon: Adin Hector, MD;  Location: [facility];  Service: General;  Laterality: Right;  . Port a cath revision Right 04/29/2013    Procedure: PORT A CATH REVISION;  Surgeon: Adin Hector, MD;  Location: [facility];  Service: General;  Laterality: Right;    FAMILY HISTORY Family History  Problem Relation Age of Onset  . Cancer Mother   . Hypertension Mother   . Bowel Disease Mother   . Lung disease Father   . Heart disease Maternal Grandmother   . Heart disease Maternal Grandfather    the patient's mother,Flowrene Millings, had a lung mass but apparently died from unrelated causes (she was followed by Dr. Earlie Server here). She was 64 years old. The patient has little information about her father. The patient had 2 brothers, no sisters. There is no history of breast or ovarian cancer in the family to her knowledge.  GYNECOLOGIC HISTORY:  Menarche age 29, first live birth age 33, the patient is GX P1. She went through the change of life approximately 1999. She did not take hormone replacement.  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 She will need to be on disability throughout her treatment. She is single, lives alone with her [place]. The patient's son Helen Hashimoto died at the age of 10 with acute leukemia. The patient has no grandchildren.    ADVANCED DIRECTIVES: Not in place, but the patient was given the  appropriate documents to complete and notarize on her 03/16/2013 visit. She intends to name her cousin, Sharrie Rothman, as her healthcare power of attorney. She can be reached at [place]:  (Updated 06/02/2013) History  Substance Use Topics  . Smoking status: Former Smoker -- 1.00 packs/day    Types: Cigarettes    Quit date: 04/12/2001  . Smokeless tobacco: Never Used  . Alcohol Use: Yes     Comment: occasional wine     Colonoscopy: Not on file  PAP: UTD/Dr. Delsa Sale  Bone density:  Never  Lipid panel:  Dec 2014/Dr. Jackson-Moore   No Known Allergies  Current Outpatient Prescriptions  Medication Sig Dispense Refill  . amLODipine-olmesartan (AZOR) 10-40 MG per tablet Take 1 tablet by mouth every morning.      Marland Kitchen dexamethasone (DECADRON) 4 MG tablet 2 tabs by mouth twice daily with food on day before and 3 days after each chemo dose  30 tablet  2  . hydrocortisone 2.5 % cream Apply 1 application topically 2 (two) times daily.      Marland Kitchen lidocaine-prilocaine (EMLA) cream Apply 1 application topically daily as needed. Pain port access 1-2 hours before chemo      . LORazepam (ATIVAN) 0.5 MG tablet Take 0.5 mg by mouth. Take 1 -2 tabs at night as needed for sleep      .  Multiple Vitamin (MULTIVITAMIN) capsule Take 1 capsule by mouth daily.      Marland Kitchen omeprazole (PRILOSEC) 20 MG capsule Take 1 capsule (20 mg total) by mouth daily.  30 capsule  2  . HYDROcodone-acetaminophen (NORCO/VICODIN) 5-325 MG per tablet Take 1-2 tablets by mouth every 6 (six) hours as needed for moderate pain.      . naproxen sodium (ANAPROX) 220 MG tablet Take 220 mg by mouth 2 (two) times daily as needed.      . ondansetron (ZOFRAN) 8 MG tablet Take 8 mg by mouth 2 (two) times daily as needed for nausea or vomiting.      . prochlorperazine (COMPAZINE) 10 MG tablet Take 10 mg by mouth every 6 (six) hours as needed for nausea or vomiting. For 3 days after chemo       No current facility-administered  medications for this visit.   Facility-Administered Medications Ordered in Other Visits  Medication Dose Route Frequency Provider Last Rate Last Dose  . DOCEtaxel (TAXOTERE) 150 mg in sodium chloride 0.9 % 250 mL chemo infusion  75 mg/m2 (Treatment Plan Actual) Intravenous Once Theotis Burrow, PA-C 265 mL/hr at 06/02/13 1738 150 mg at 06/02/13 1738  . heparin lock flush 100 unit/mL  500 Units Intracatheter Once PRN Aundrea Higginbotham Milda Smart, PA-C      . sodium chloride 0.9 % injection 10 mL  10 mL Intracatheter PRN Krishna Dancel Milda Smart, PA-C        OBJECTIVE: Middle-aged [place] American woman who appears comfortable and is in no acute distress Filed Vitals:   06/02/13 1402  BP: 151/85  Pulse: 89  Temp: 98.5 F (36.9 C)  Resp: 18     Body mass index is 39.74 kg/(m^2).    ECOG FS:0 - Asymptomatic Filed Weights   06/02/13 1402  Weight: 210 lb 3.2 oz (95.346 kg)   Physical Exam: HEENT:  Sclerae anicteric. Oropharynx is benign, pink, and moist. Poor dentition. No evidence of ulcerations or candidiasis.  Neck supple, trachea midline.  NODES:  No cervical or supraclavicular lymphadenopathy palpated.  BREAST EXAM: There is a palpable mass taking up the majority of the right breast, although does feel slightly softer than previously examined. The right nipple is still inverted, but there is no drainage or discharge. No additional skin changes. Left breast is unremarkable. Axillae are benign bilaterally, with no palpable lymphadenopathy.  LUNGS:  Clear to auscultation bilaterally with good excursion.  No wheezes or rhonchi HEART:  Regular rate and rhythm. No murmur ABDOMEN:  Soft, nontender. No organomegaly palpated Positive bowel sounds.  MSK:  No focal spinal tenderness to palpation. Full range of motion bilaterally in the upper extremities, although there is some discomfort with active movement of the right shoulder. EXTREMITIES:  1+ pitting edema in the left lower extremity. 2+ pitting edema in the right lower  extremity. No upper extremity edema is noted. SKIN:  Benign with no visible rashes or skin changes other than hyperpigmentation, especially affecting the hands and nails.  No excessive ecchymoses, and no petechiae. Port is intact in the right upper chest wall with no erythema, edema, or signs of infection/cellulitis. NEURO:  Nonfocal. Well oriented.  Appropriate affect.   LAB RESULTS:   Lab Results  Component Value Date   WBC 19.9* 06/02/2013   NEUTROABS 15.4* 06/02/2013   HGB 12.6 06/02/2013   HCT 38.3 06/02/2013   MCV 82.0 06/02/2013   PLT 421* 06/02/2013      Chemistry      Component  Value Date/Time   NA 145 05/19/2013 1003   NA 141 04/04/2013 1031   K 3.7 05/19/2013 1003   K 3.6* 04/04/2013 1031   CL 103 04/04/2013 1031   CO2 26 05/19/2013 1003   CO2 27 04/04/2013 1031   BUN 15.5 05/19/2013 1003   BUN 16 04/04/2013 1031   CREATININE 1.0 05/19/2013 1003   CREATININE 0.81 04/04/2013 1031   CREATININE 0.75 03/02/2013 1135      Component Value Date/Time   CALCIUM 10.0 05/19/2013 1003   CALCIUM 9.3 04/04/2013 1031   ALKPHOS 72 05/19/2013 1003   ALKPHOS 74 04/04/2013 1031   AST 11 05/19/2013 1003   AST 16 04/04/2013 1031   ALT 23 05/19/2013 1003   ALT 23 04/04/2013 1031   BILITOT <0.20 05/19/2013 1003   BILITOT 0.3 04/04/2013 1031       STUDIES:  Echocardiogram on 03/29/2013 showed an ejection fraction of 55-60%.    Dg Chest Portable 1 View 04/29/2013   CLINICAL DATA:  Breast cancer.  Port-A-Cath revision.  EXAM: PORTABLE CHEST - 1 VIEW  COMPARISON:  Chest x-ray dated 04/15/2013  FINDINGS: Port-A-Cath has been revised. The tip is at the cavoatrial junction in good position. There is mild chronic cardiomegaly. Minimal linear atelectasis at the left lung base. Lungs are otherwise clear. No pneumothorax. No osseous abnormality.  IMPRESSION: Revision of Port-A-Cath as described. Minimal atelectasis at the left lung base.   Electronically Signed   By: Rozetta Nunnery M.D.   On: 04/29/2013 12:48     ASSESSMENT:  63 y.o. [place] woman   (1)  status post right breast biopsy 03/04/2013 for a clinical T3 N1, stage IIIA invasive ductal carcinoma, grade 3, estrogen receptor 100% positive, progesterone receptor 100% positive, with an MIB-1 of 20% and no HER-2 amplification.  (2) right axillary lymph node biopsy 03/04/2013 was benign   (3) status post Port-A-Cath remission on 04/29/2013  (4) Neoadjuvant chemotherapy with docetaxel doxorubicin and cyclophosphamide - start date  on 05/03/2013 to be repeated once every 3 weeks for 6 cycles.   (5) Neulasta on day 2 of each cycle for granulocyte support  (6)  patient will likely need a mastectomy following neoadjuvant chemotherapy.  (7) GERD -  controlled with omeprazole  (8) history of right shoulder dislocation with intermittent right shoulder pain - controlled with Aleve  (9)  swelling in the lower extremities, pitting edema greater on the right than the left    PLAN: Meridith will proceed to treatment today as scheduled for her second neoadjuvant cycle of docetaxel/doxorubicin/cyclophosphamide. I'm making no changes to her current regimen, and she'll continue utilizing the same antinausea medications at home which seemed to be helpful. She will return tomorrow for her Neulasta injection, and I will see her on March 17 for repeat labs and assessment of chemotoxicity.    Beginning with cycle 3, [place] would like to shift her appointments back to choose days. Accordingly, she will be scheduled for day 1 cycle 3 of therapy on March 24. She will have a breast MRI soon thereafter to assess response to neoadjuvant chemotherapy, and will likely see Dr. Jana Hakim in early April to review those results and discuss her treatment plan. She will also need to see Dr. Dalbert Batman in early April as well. Both the MRI and the appointment with Dr. Dalbert Batman are being ordered today.  I am sending Sallie for a Doppler study of the right lower extremity today to evaluate for  possible DVT. If that study is  negative, I have encouraged her to drink more fluids, keep herself well hydrated, keep her legs elevated as much as possible, and watch her sodium intake.  We will continue to follow the pain in her right shoulder which I think may be associated with past injury. If necessary we will consider an orthopedic referral.  Tonishia voices understanding and agreement with the plan as stated above.  She also knows to call us with any changes or problems prior to her next scheduled appointment.   Durene Fruits  Medical oncology   06/02/2013 5:45 PM

## 2013-06-02 NOTE — Patient Instructions (Signed)
[facility] Discharge Instructions for Patients Receiving Chemotherapy  Today you received the following chemotherapy agents: Adriamycin, Cytoxan, Taxotere  To help prevent nausea and vomiting after your treatment, we encourage you to take your nausea medication as prescribed.   If you develop nausea and vomiting that is not controlled by your nausea medication, call the clinic.   BELOW ARE SYMPTOMS THAT SHOULD BE REPORTED IMMEDIATELY:  *FEVER GREATER THAN 100.5 F  *CHILLS WITH OR WITHOUT FEVER  NAUSEA AND VOMITING THAT IS NOT CONTROLLED WITH YOUR NAUSEA MEDICATION  *UNUSUAL SHORTNESS OF BREATH  *UNUSUAL BRUISING OR BLEEDING  TENDERNESS IN MOUTH AND THROAT WITH OR WITHOUT PRESENCE OF ULCERS  *URINARY PROBLEMS  *BOWEL PROBLEMS  UNUSUAL RASH Items with * indicate a potential emergency and should be followed up as soon as possible.  Feel free to call the clinic you have any questions or concerns. The clinic phone number is [phone].

## 2013-06-03 ENCOUNTER — Ambulatory Visit (HOSPITAL_BASED_OUTPATIENT_CLINIC_OR_DEPARTMENT_OTHER): Payer: 59

## 2013-06-03 ENCOUNTER — Encounter (INDEPENDENT_AMBULATORY_CARE_PROVIDER_SITE_OTHER): Payer: Self-pay

## 2013-06-03 ENCOUNTER — Ambulatory Visit: Payer: 59

## 2013-06-03 VITALS — BP 146/66 | HR 85 | Temp 98.0°F

## 2013-06-03 DIAGNOSIS — C50419 Malignant neoplasm of upper-outer quadrant of unspecified female breast: Secondary | ICD-10-CM

## 2013-06-03 DIAGNOSIS — Z5189 Encounter for other specified aftercare: Secondary | ICD-10-CM

## 2013-06-03 DIAGNOSIS — C50411 Malignant neoplasm of upper-outer quadrant of right female breast: Secondary | ICD-10-CM

## 2013-06-03 MED ORDER — PEGFILGRASTIM INJECTION 6 MG/0.6ML
6.0000 mg | Freq: Once | SUBCUTANEOUS | Status: AC
  Administered 2013-06-03: 6 mg via SUBCUTANEOUS
  Filled 2013-06-03: qty 0.6

## 2013-06-08 ENCOUNTER — Other Ambulatory Visit: Payer: Self-pay | Admitting: Physician Assistant

## 2013-06-08 ENCOUNTER — Telehealth: Payer: Self-pay | Admitting: Oncology

## 2013-06-08 NOTE — Telephone Encounter (Signed)
S/w the pt and she is aware of her appt on 06/29/2013 @2 :00pm

## 2013-06-09 ENCOUNTER — Ambulatory Visit: Payer: 59

## 2013-06-09 ENCOUNTER — Other Ambulatory Visit: Payer: 59

## 2013-06-09 ENCOUNTER — Ambulatory Visit: Payer: 59 | Admitting: Oncology

## 2013-06-09 ENCOUNTER — Ambulatory Visit: Payer: 59 | Admitting: Physician Assistant

## 2013-06-10 ENCOUNTER — Telehealth: Payer: Self-pay | Admitting: Oncology

## 2013-06-10 ENCOUNTER — Ambulatory Visit: Payer: 59

## 2013-06-10 NOTE — Telephone Encounter (Signed)
S/w gail from [facility] and she stated that she will call the pt with the breast mri appt on Monday.

## 2013-06-14 ENCOUNTER — Telehealth: Payer: Self-pay | Admitting: *Deleted

## 2013-06-14 ENCOUNTER — Ambulatory Visit: Payer: 59

## 2013-06-14 ENCOUNTER — Ambulatory Visit (HOSPITAL_BASED_OUTPATIENT_CLINIC_OR_DEPARTMENT_OTHER): Payer: 59 | Admitting: Physician Assistant

## 2013-06-14 ENCOUNTER — Other Ambulatory Visit (HOSPITAL_BASED_OUTPATIENT_CLINIC_OR_DEPARTMENT_OTHER): Payer: 59

## 2013-06-14 ENCOUNTER — Encounter: Payer: Self-pay | Admitting: Physician Assistant

## 2013-06-14 VITALS — BP 127/77 | HR 85 | Temp 98.4°F | Resp 18 | Ht 61.0 in | Wt 210.1 lb

## 2013-06-14 DIAGNOSIS — C50411 Malignant neoplasm of upper-outer quadrant of right female breast: Secondary | ICD-10-CM

## 2013-06-14 DIAGNOSIS — Z170 Estrogen receptor positive status [ER+]: Secondary | ICD-10-CM

## 2013-06-14 DIAGNOSIS — C50419 Malignant neoplasm of upper-outer quadrant of unspecified female breast: Secondary | ICD-10-CM

## 2013-06-14 DIAGNOSIS — G609 Hereditary and idiopathic neuropathy, unspecified: Secondary | ICD-10-CM

## 2013-06-14 LAB — CBC WITH DIFFERENTIAL/PLATELET
BASO%: 0.7 % (ref 0.0–2.0)
BASOS ABS: 0.1 10*3/uL (ref 0.0–0.1)
EOS%: 0.1 % (ref 0.0–7.0)
Eosinophils Absolute: 0 10*3/uL (ref 0.0–0.5)
HCT: 39.1 % (ref 34.8–46.6)
HEMOGLOBIN: 12.5 g/dL (ref 11.6–15.9)
LYMPH#: 2.1 10*3/uL (ref 0.9–3.3)
LYMPH%: 15.7 % (ref 14.0–49.7)
MCH: 26.8 pg (ref 25.1–34.0)
MCHC: 32 g/dL (ref 31.5–36.0)
MCV: 83.7 fL (ref 79.5–101.0)
MONO#: 1.1 10*3/uL — AB (ref 0.1–0.9)
MONO%: 8.6 % (ref 0.0–14.0)
NEUT#: 9.8 10*3/uL — ABNORMAL HIGH (ref 1.5–6.5)
NEUT%: 74.9 % (ref 38.4–76.8)
Platelets: 152 10*3/uL (ref 145–400)
RBC: 4.67 10*6/uL (ref 3.70–5.45)
RDW: 16.2 % — ABNORMAL HIGH (ref 11.2–14.5)
WBC: 13.1 10*3/uL — ABNORMAL HIGH (ref 3.9–10.3)

## 2013-06-14 LAB — COMPREHENSIVE METABOLIC PANEL (CC13)
ALT: 21 U/L (ref 0–55)
AST: 10 U/L (ref 5–34)
Albumin: 3.3 g/dL — ABNORMAL LOW (ref 3.5–5.0)
Alkaline Phosphatase: 102 U/L (ref 40–150)
Anion Gap: 10 mEq/L (ref 3–11)
BUN: 19.8 mg/dL (ref 7.0–26.0)
CALCIUM: 9.9 mg/dL (ref 8.4–10.4)
CO2: 25 mEq/L (ref 22–29)
CREATININE: 1.2 mg/dL — AB (ref 0.6–1.1)
Chloride: 108 mEq/L (ref 98–109)
Glucose: 115 mg/dl (ref 70–140)
POTASSIUM: 4.3 meq/L (ref 3.5–5.1)
Sodium: 142 mEq/L (ref 136–145)
Total Bilirubin: <0.2 mg/dL (ref 0.20–1.20)
Total Protein: 7.3 g/dL (ref 6.4–8.3)

## 2013-06-14 NOTE — Telephone Encounter (Signed)
Per staff message and POF I have scheduled appts.  JMW  

## 2013-06-14 NOTE — Progress Notes (Signed)
ID: [name] OB: [birth year]  MR#: [id number]  CSN#:[id number]  PCP: Ron Parker, MD GYN:  Lahoma Crocker SU: Fanny Skates OTHER MD: Thea Silversmith   CURRENT THERAPY: Neodjuvant chemotherapy DOCETAXEL/ADRIAMYCIN/CYTOXAN (TAC)  CHIEF COMPLAINT: Right breast cancer/neoadjuvant chemotherapy  HISTORY OF PRESENT ILLNESS: As per the previously documented note: Denise Becker palpated a change in her right breast sometime in August or September 2014. She was aware that this might be significant, but waited on meds until she saw her gynecologist for a routine visit. Dr. Delsa Sale immediately he set her up for right mammography and ultrasonography performed 03/03/2013. Mammography found an area of density in the upper outer quadrant of the right breast measuring 5 cm in the with right nipple retraction. There was no evidence of skin thickening. On exam there was a firm palpable mass in the superior right breast extending from 1:00 to 9:00. The right nipple was completely retracted. There was no skin thickening. The right axilla was benign by palpation. Ultrasound showed a prominent irregular hypoechoic mass larger than the ultrasound screen. Ultrasound of the right axilla showed 2 adjacent small lymph nodes with diffusely thickened cortices, the largest measuring 1.1 cm.  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 There was no HER-2 amplification with a ratio of 1.46 by CISH and an average copy number of 2.85.  On 03/11/2013 the patient underwent bilateral breast MRI. This showed an irregular enhancing mass in the central to upper right breast with nipple retraction measuring in total 6.8  cm. There were several adjacent enhancing nodules as well. There was no involvement of the pectoralis muscle. There were no definite morphologically abnormal right axillary lymph nodes there was no definite internal mammary or lymphadenopathy. Left breast was unremarkable.  The patient's subsequent history is as detailed below   INTERVAL HISTORY: Denise Becker returns alone today for followup of her locally advanced right breast carcinoma. She is currently day 13 cycle 2 of docetaxel/doxorubicin/cyclophosphamide being given in the neoadjuvant setting. She receives Neulasta on day 2 for granulocyte support.  Denise Becker continues to tolerate this regimen well with very few complaints today. She has started to note some sensations of numbness in the soles of her feet bilaterally. She tells me this comes and goes, and is more noticeable at night. She describes this as mild, and tells me it is not affecting any of her day-to-day activities or her ability to walk. She's had no signs of peripheral neuropathy in the upper extremities. Her finger nails are beginning to darken, and the nails and nailbeds feel very sensitive. She's had no loosening of the nails from the nail beds, denies any drainage, and has had no signs of infection.   Denise Becker was previously evaluated for swelling in her right leg, and fortunately a Doppler 2 weeks ago was negative for DVT. The swelling has improved significantly, almost completely resolved at this point.      REVIEW OF SYSTEMS: Jazline denies any recent fevers, chills, night sweats, or hot flashes. She's had no abnormal bruising or bleeding and denies any mouth ulcers or oral sensitivity.  Her appetite is fair. She is trying to keep herself well hydrated. She denies any problems with nausea, emesis, or change in bowel or bladder habits. She  continues to complain of a runny nose, but has no significant sinus congestion, no cough, phlegm production, pleurisy, or increased shortness of  breath. She denies any epistasis. She's had no chest pain or palpitations. She also denies any abnormal headaches, dizziness, or change in vision. She's had no new or unusual myalgias, arthralgias, or bony pain.  A detailed review of systems is otherwise stable and noncontributory.     PAST MEDICAL HISTORY: Past Medical History  Diagnosis Date  . Anxiety   . Depression     history of depression after son died  . Breast cancer of upper-outer quadrant of right female breast 03/10/2013  . Hypertension     PAST SURGICAL HISTORY: Past Surgical History  Procedure Laterality Date  . Abdominal hysterectomy    . Multiple tooth extractions Bilateral   . Tonsillectomy    . Cesarean section    . Portacath placement Right 04/15/2013    Procedure: INSERTION PORT-A-CATH;  Surgeon: Adin Hector, MD;  Location: [facility];  Service: General;  Laterality: Right;  . Port a cath revision Right 04/29/2013    Procedure: PORT A CATH REVISION;  Surgeon: Adin Hector, MD;  Location: [facility];  Service: General;  Laterality: Right;    FAMILY HISTORY Family History  Problem Relation Age of Onset  . Cancer Mother   . Hypertension Mother   . Bowel Disease Mother   . Lung disease Father   . Heart disease Maternal Grandmother   . Heart disease Maternal Grandfather    the patient's mother,Denise Becker, had a lung mass but apparently died from unrelated causes (she was followed by Dr. Earlie Server here). She was 64 years old. The patient has little information about her father. The patient had 2 brothers, no sisters. There is no history of breast or ovarian cancer in the family to her knowledge.  GYNECOLOGIC HISTORY:  Menarche age 25, first live birth age 4, the patient is GX P1. She went through the change of life approximately 1999. She did not take hormone replacement.  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 She will need to be on disability throughout her treatment. She is single, lives alone with her [place]. The patient's son Helen Hashimoto died at the age of 14 with acute leukemia. The patient has no grandchildren.    ADVANCED DIRECTIVES: Not in place, but the patient was given the appropriate documents to complete and notarize on her 03/16/2013 visit. She intends to name her cousin, Sharrie Rothman, as her healthcare power of attorney. She can be reached at [place]:  (Updated 06/02/2013) History  Substance Use Topics  . Smoking status: Former Smoker -- 1.00 packs/day    Types: Cigarettes    Quit date: 04/12/2001  . Smokeless tobacco: Never Used  . Alcohol Use: Yes     Comment: occasional wine     Colonoscopy: Not on file  PAP: UTD/Dr. Delsa Sale  Bone density:  Never  Lipid panel:  Dec 2014/Dr. Jackson-Moore   No Known Allergies  Current Outpatient Prescriptions  Medication Sig Dispense Refill  . amLODipine-olmesartan (AZOR) 10-40 MG per tablet Take 1 tablet by mouth every morning.      . hydrocortisone 2.5 % cream Apply 1 application topically 2 (two) times daily.      Marland Kitchen lidocaine-prilocaine (EMLA) cream Apply 1 application topically daily as needed. Pain port access 1-2 hours before chemo      .  Loratadine (CLARITIN PO) Take 1 tablet by mouth.      Marland Kitchen LORazepam (ATIVAN) 0.5 MG tablet Take 0.5 mg by mouth. Take 1 -2 tabs at night as needed for sleep      . Multiple Vitamin (MULTIVITAMIN) capsule Take 1 capsule by mouth daily.      Marland Kitchen omeprazole (PRILOSEC) 20 MG capsule Take 1 capsule (20 mg total) by mouth daily.  30 capsule  2  . dexamethasone (DECADRON) 4 MG tablet 2 tabs by mouth twice daily with food on day before and 3 days after each chemo dose  30 tablet  2  . HYDROcodone-acetaminophen (NORCO/VICODIN) 5-325 MG per tablet Take 1-2 tablets by mouth every 6 (six) hours as needed for moderate pain.      . naproxen sodium  (ANAPROX) 220 MG tablet Take 220 mg by mouth 2 (two) times daily as needed.      . ondansetron (ZOFRAN) 8 MG tablet Take 8 mg by mouth 2 (two) times daily as needed for nausea or vomiting.      . prochlorperazine (COMPAZINE) 10 MG tablet Take 10 mg by mouth every 6 (six) hours as needed for nausea or vomiting. For 3 days after chemo       No current facility-administered medications for this visit.    OBJECTIVE: Middle-aged [place] American woman who appears comfortable and is in no acute distress Filed Vitals:   06/14/13 1040  BP: 127/77  Pulse: 85  Temp: 98.4 F (36.9 C)  Resp: 18     Body mass index is 39.72 kg/(m^2).    ECOG FS:0 - Asymptomatic Filed Weights   06/14/13 1040  Weight: 210 lb 1.6 oz (95.301 kg)   Physical Exam: HEENT:  Sclerae anicteric. Oropharynx is benign, pink, and moist. Poor dentition. No mucositis. No oropharyngeal candidiasis.  Neck supple, trachea midline.  NODES:  No cervical or supraclavicular lymphadenopathy palpated.  BREAST EXAM: Deferred. Axillae are benign bilaterally, with no palpable lymphadenopathy.  LUNGS:  Clear to auscultation bilaterally with good excursion. No dullness to percussion. No wheezes or rhonchi HEART:  Regular rate and rhythm. No murmur appreciated. ABDOMEN:  Soft, obese, nontender. No organomegaly palpated. Positive bowel sounds.  MSK:  No focal spinal tenderness to palpation. Good range of motion bilaterally in the upper extremities with no significant discomfort. EXTREMITIES:  Nonpitting edema bilaterally, slightly greater in the right than the left. No upper extremity edema is noted. SKIN:  Notable hyperpigmentation, especially affecting the hands and nails. No drainage from beneath the nails, and no evidence of infection. No excessive ecchymoses, and no petechiae. Port is intact in the right upper chest wall with no erythema, edema, or signs of infection/cellulitis. NEURO:  Nonfocal. Well oriented.  Appropriate affect.   LAB  RESULTS:   Lab Results  Component Value Date   WBC 13.1* 06/14/2013   NEUTROABS 9.8* 06/14/2013   HGB 12.5 06/14/2013   HCT 39.1 06/14/2013   MCV 83.7 06/14/2013   PLT 152 06/14/2013      Chemistry      Component Value Date/Time   NA 142 06/14/2013 1027   NA 141 04/04/2013 1031   K 4.3 06/14/2013 1027   K 3.6* 04/04/2013 1031   CL 103 04/04/2013 1031   CO2 25 06/14/2013 1027   CO2 27 04/04/2013 1031   BUN 19.8 06/14/2013 1027   BUN 16 04/04/2013 1031   CREATININE 1.2* 06/14/2013 1027   CREATININE 0.81 04/04/2013 1031   CREATININE 0.75 03/02/2013 1135  Component Value Date/Time   CALCIUM 9.9 06/14/2013 1027   CALCIUM 9.3 04/04/2013 1031   ALKPHOS 102 06/14/2013 1027   ALKPHOS 74 04/04/2013 1031   AST 10 06/14/2013 1027   AST 16 04/04/2013 1031   ALT 21 06/14/2013 1027   ALT 23 04/04/2013 1031   BILITOT <0.20 06/14/2013 1027   BILITOT 0.3 04/04/2013 1031       STUDIES:  Echocardiogram on 03/29/2013 showed an ejection fraction of 55-60%.    Dg Chest Portable 1 View 04/29/2013   CLINICAL DATA:  Breast cancer.  Port-A-Cath revision.  EXAM: PORTABLE CHEST - 1 VIEW  COMPARISON:  Chest x-ray dated 04/15/2013  FINDINGS: Port-A-Cath has been revised. The tip is at the cavoatrial junction in good position. There is mild chronic cardiomegaly. Minimal linear atelectasis at the left lung base. Lungs are otherwise clear. No pneumothorax. No osseous abnormality.  IMPRESSION: Revision of Port-A-Cath as described. Minimal atelectasis at the left lung base.   Electronically Signed   By: Rozetta Nunnery M.D.   On: 04/29/2013 12:48     ASSESSMENT: 63 y.o. [place] woman   (1)  status post right breast biopsy 03/04/2013 for a clinical T3 N1, stage IIIA invasive ductal carcinoma, grade 3, estrogen receptor 100% positive, progesterone receptor 100% positive, with an MIB-1 of 20% and no HER-2 amplification.  (2) right axillary lymph node biopsy 03/04/2013 was benign   (3) status post Port-A-Cath remission on  04/29/2013  (4) Neoadjuvant chemotherapy with docetaxel doxorubicin and cyclophosphamide - start date  on 05/03/2013 to be repeated once every 3 weeks for 6 cycles.   (5) Neulasta on day 2 of each cycle for granulocyte support  (6)  patient will likely need a mastectomy following neoadjuvant chemotherapy.  (7) GERD -  controlled with omeprazole  (8) history of right shoulder dislocation with intermittent right shoulder pain - controlled with Aleve  (9)  swelling in the lower extremities, pitting edema greater on the right than the left - improving. (Doppler study of the right lower extremity was negative for a DVT on 06/02/2013.)    PLAN: Denise Becker will return next week on March 24 in anticipation of her third dose of neoadjuvant docetaxel/doxorubicin/cyclophosphamide, and will receive her Neulasta injection on March 25, day 2. I will continute to follow her current peripheral neuropathy. She is already scheduled for repeat breast MRI on March 30 2 assess response to neoadjuvant chemotherapy thus far. She will see both Dr. Jana Hakim and Dr. Dalbert Batman soon thereafter to review those results. Her fourth cycle of neoadjuvant chemotherapy is being scheduled for April 14.   If all goes as planned, her final dose of neoadjuvant chemotherapy will be given on May 27th, and she will be ready for her definitive surgery soon thereafter. She will likely need a mastectomy per previous evaluation. All the above was reviewed with Denise Becker today and she voices understanding and agreement with this plan. She knows to call with any changes or problems prior to her appointment with me next week.      Durene Fruits  Medical oncology   06/14/2013 8:37 PM

## 2013-06-14 NOTE — Telephone Encounter (Signed)
appts made and printed. Pt is aware that tx will be added. i emailed MW to add the tx...td 

## 2013-06-15 ENCOUNTER — Ambulatory Visit: Payer: 59

## 2013-06-21 ENCOUNTER — Ambulatory Visit (HOSPITAL_BASED_OUTPATIENT_CLINIC_OR_DEPARTMENT_OTHER): Payer: 59

## 2013-06-21 ENCOUNTER — Other Ambulatory Visit (HOSPITAL_BASED_OUTPATIENT_CLINIC_OR_DEPARTMENT_OTHER): Payer: 59

## 2013-06-21 ENCOUNTER — Ambulatory Visit (HOSPITAL_BASED_OUTPATIENT_CLINIC_OR_DEPARTMENT_OTHER): Payer: 59 | Admitting: Physician Assistant

## 2013-06-21 ENCOUNTER — Encounter: Payer: Self-pay | Admitting: Physician Assistant

## 2013-06-21 ENCOUNTER — Encounter: Payer: Self-pay | Admitting: Oncology

## 2013-06-21 VITALS — BP 134/82 | HR 85 | Temp 98.3°F | Resp 18 | Ht 61.0 in | Wt 214.9 lb

## 2013-06-21 DIAGNOSIS — C50411 Malignant neoplasm of upper-outer quadrant of right female breast: Secondary | ICD-10-CM

## 2013-06-21 DIAGNOSIS — C50419 Malignant neoplasm of upper-outer quadrant of unspecified female breast: Secondary | ICD-10-CM

## 2013-06-21 DIAGNOSIS — Z170 Estrogen receptor positive status [ER+]: Secondary | ICD-10-CM

## 2013-06-21 DIAGNOSIS — Z5111 Encounter for antineoplastic chemotherapy: Secondary | ICD-10-CM

## 2013-06-21 LAB — CBC WITH DIFFERENTIAL/PLATELET
BASO%: 0.3 % (ref 0.0–2.0)
Basophils Absolute: 0.1 10*3/uL (ref 0.0–0.1)
EOS%: 0 % (ref 0.0–7.0)
Eosinophils Absolute: 0 10*3/uL (ref 0.0–0.5)
HCT: 38.6 % (ref 34.8–46.6)
HGB: 12.5 g/dL (ref 11.6–15.9)
LYMPH%: 4.5 % — ABNORMAL LOW (ref 14.0–49.7)
MCH: 27.2 pg (ref 25.1–34.0)
MCHC: 32.5 g/dL (ref 31.5–36.0)
MCV: 83.7 fL (ref 79.5–101.0)
MONO#: 0.9 10*3/uL (ref 0.1–0.9)
MONO%: 4 % (ref 0.0–14.0)
NEUT#: 19.5 10*3/uL — ABNORMAL HIGH (ref 1.5–6.5)
NEUT%: 91.2 % — ABNORMAL HIGH (ref 38.4–76.8)
Platelets: 453 10*3/uL — ABNORMAL HIGH (ref 145–400)
RBC: 4.62 10*6/uL (ref 3.70–5.45)
RDW: 17.1 % — AB (ref 11.2–14.5)
WBC: 21.4 10*3/uL — ABNORMAL HIGH (ref 3.9–10.3)
lymph#: 1 10*3/uL (ref 0.9–3.3)

## 2013-06-21 MED ORDER — SODIUM CHLORIDE 0.9 % IV SOLN
75.0000 mg/m2 | Freq: Once | INTRAVENOUS | Status: AC
  Administered 2013-06-21: 150 mg via INTRAVENOUS
  Filled 2013-06-21: qty 15

## 2013-06-21 MED ORDER — PALONOSETRON HCL INJECTION 0.25 MG/5ML
INTRAVENOUS | Status: AC
Start: 2013-06-21 — End: 2013-06-21
  Filled 2013-06-21: qty 5

## 2013-06-21 MED ORDER — SODIUM CHLORIDE 0.9 % IV SOLN
150.0000 mg | Freq: Once | INTRAVENOUS | Status: AC
  Administered 2013-06-21: 150 mg via INTRAVENOUS
  Filled 2013-06-21: qty 5

## 2013-06-21 MED ORDER — SODIUM CHLORIDE 0.9 % IV SOLN
Freq: Once | INTRAVENOUS | Status: AC
  Administered 2013-06-21: 14:00:00 via INTRAVENOUS

## 2013-06-21 MED ORDER — PALONOSETRON HCL INJECTION 0.25 MG/5ML
0.2500 mg | Freq: Once | INTRAVENOUS | Status: AC
  Administered 2013-06-21: 0.25 mg via INTRAVENOUS

## 2013-06-21 MED ORDER — SODIUM CHLORIDE 0.9 % IJ SOLN
10.0000 mL | INTRAMUSCULAR | Status: DC | PRN
  Administered 2013-06-21: 10 mL
  Filled 2013-06-21: qty 10

## 2013-06-21 MED ORDER — HEPARIN SOD (PORK) LOCK FLUSH 100 UNIT/ML IV SOLN
500.0000 [IU] | Freq: Once | INTRAVENOUS | Status: AC | PRN
  Administered 2013-06-21: 500 [IU]
  Filled 2013-06-21: qty 5

## 2013-06-21 MED ORDER — DOXORUBICIN HCL CHEMO IV INJECTION 2 MG/ML
50.0000 mg/m2 | Freq: Once | INTRAVENOUS | Status: AC
  Administered 2013-06-21: 102 mg via INTRAVENOUS
  Filled 2013-06-21: qty 51

## 2013-06-21 MED ORDER — DEXAMETHASONE SODIUM PHOSPHATE 20 MG/5ML IJ SOLN
INTRAMUSCULAR | Status: AC
  Filled 2013-06-21: qty 5

## 2013-06-21 MED ORDER — DEXAMETHASONE SODIUM PHOSPHATE 20 MG/5ML IJ SOLN
12.0000 mg | Freq: Once | INTRAMUSCULAR | Status: AC
  Administered 2013-06-21: 12 mg via INTRAVENOUS

## 2013-06-21 MED ORDER — SODIUM CHLORIDE 0.9 % IV SOLN
500.0000 mg/m2 | Freq: Once | INTRAVENOUS | Status: AC
  Administered 2013-06-21: 1020 mg via INTRAVENOUS
  Filled 2013-06-21: qty 51

## 2013-06-21 NOTE — Patient Instructions (Signed)
[facility] Discharge Instructions for Patients Receiving Chemotherapy  Today you received the following chemotherapy agents Doxorubicin, Cytoxan and Taxotere.  To help prevent nausea and vomiting after your treatment, we encourage you to take your nausea medication.   If you develop nausea and vomiting that is not controlled by your nausea medication, call the clinic.   BELOW ARE SYMPTOMS THAT SHOULD BE REPORTED IMMEDIATELY:  *FEVER GREATER THAN 100.5 F  *CHILLS WITH OR WITHOUT FEVER  NAUSEA AND VOMITING THAT IS NOT CONTROLLED WITH YOUR NAUSEA MEDICATION  *UNUSUAL SHORTNESS OF BREATH  *UNUSUAL BRUISING OR BLEEDING  TENDERNESS IN MOUTH AND THROAT WITH OR WITHOUT PRESENCE OF ULCERS  *URINARY PROBLEMS  *BOWEL PROBLEMS  UNUSUAL RASH Items with * indicate a potential emergency and should be followed up as soon as possible.  Feel free to call the clinic you have any questions or concerns. The clinic phone number is [phone].

## 2013-06-21 NOTE — Progress Notes (Signed)
Patient request bills for [facility] to be mailed for Cancer policy.

## 2013-06-21 NOTE — Progress Notes (Signed)
ID: Denise Becker OB: 10-Oct-1950  MR#: [id number]  CSN#:[id number]  PCP: Ron Parker, MD GYN:  Lahoma Crocker SU: Fanny Skates OTHER MD: Thea Silversmith   CURRENT THERAPY: Neodjuvant chemotherapy DOCETAXEL/ADRIAMYCIN/CYTOXAN (TAC)  CHIEF COMPLAINT: Right breast cancer/neoadjuvant chemotherapy  HISTORY OF PRESENT ILLNESS: As per the previously documented note: Denise Becker palpated a change in her right breast sometime in August or September 2014. She was aware that this might be significant, but waited on meds until she saw her gynecologist for a routine visit. Dr. Delsa Sale immediately he set her up for right mammography and ultrasonography performed 03/03/2013. Mammography found an area of density in the upper outer quadrant of the right breast measuring 5 cm in the with right nipple retraction. There was no evidence of skin thickening. On exam there was a firm palpable mass in the superior right breast extending from 1:00 to 9:00. The right nipple was completely retracted. There was no skin thickening. The right axilla was benign by palpation. Ultrasound showed a prominent irregular hypoechoic mass larger than the ultrasound screen. Ultrasound of the right axilla showed 2 adjacent small lymph nodes with diffusely thickened cortices, the largest measuring 1.1 cm.  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 There was no HER-2 amplification with a ratio of 1.46 by CISH and an average copy number of 2.85.  On 03/11/2013 the patient underwent bilateral breast MRI. This showed an irregular enhancing mass in the central to upper right breast with nipple retraction measuring in total 6.8  cm. There were several adjacent enhancing nodules as well. There was no involvement of the pectoralis muscle. There were no definite morphologically abnormal right axillary lymph nodes there was no definite internal mammary or lymphadenopathy. Left breast was unremarkable.  The patient's subsequent history is as detailed below   INTERVAL HISTORY: Denise Becker returns alone today for followup of her locally advanced right breast carcinoma. She is she is due for her third of 6 planned q. three-week doses of docetaxel/doxorubicin/cyclophosphamide being given in the neoadjuvant setting. She receives Neulasta on day 2 for granulocyte support.  Denise Becker is feeling well with no new complaints today. She is really tolerating this chemotherapy very well. Specifically, she has no signs of numbness or tingling in the upper or lower extremities today. Her feet tend to swell, but this improves with elevation. Her fingernails are dark, but not sensitive today, and there is no drainage or evidence of infection.   REVIEW OF SYSTEMS: Denise Becker denies any recent fevers, chills, night sweats, or hot flashes. She's had no skin changes or rashes, no abnormal bruising or bleeding , and no mouth ulcers or oral sensitivity.  Her appetite is fair. She denies any problems with nausea, emesis, or change in bowel or bladder habits. She has had no increased cough, phlegm production, pleurisy, or increased shortness of breath. She denies any epistasis. She's had no chest pain or palpitations. She also denies any abnormal headaches, dizziness, or change in vision. She has some bony pain over the past couple of weeks secondary to Neulasta injection, but that has resolved. Currently, she has no additional myalgias, arthralgias, or bony pain.  A detailed review of systems is otherwise stable and noncontributory.     PAST MEDICAL HISTORY: Past  Medical History  Diagnosis Date  . Anxiety   . Depression     history of depression after son  died  . Breast cancer of upper-outer quadrant of right female breast 03/10/2013  . Hypertension     PAST SURGICAL HISTORY: Past Surgical History  Procedure Laterality Date  . Abdominal hysterectomy    . Multiple tooth extractions Bilateral   . Tonsillectomy    . Cesarean section    . Portacath placement Right 04/15/2013    Procedure: INSERTION PORT-A-CATH;  Surgeon: Adin Hector, MD;  Location: [facility];  Service: General;  Laterality: Right;  . Port a cath revision Right 04/29/2013    Procedure: PORT A CATH REVISION;  Surgeon: Adin Hector, MD;  Location: [place];  Service: General;  Laterality: Right;    FAMILY HISTORY Family History  Problem Relation Age of Onset  . Cancer Mother   . Hypertension Mother   . Bowel Disease Mother   . Lung disease Father   . Heart disease Maternal Grandmother   . Heart disease Maternal Grandfather    the patient's mother,Denise Becker, had a lung mass but apparently died from unrelated causes (she was followed by Dr. Earlie Server here). She was 64 years old. The patient has little information about her father. The patient had 2 brothers, no sisters. There is no history of breast or ovarian cancer in the family to her knowledge.  GYNECOLOGIC HISTORY:  Menarche age 43, first live birth age 19, the patient is GX P1. She went through the change of life approximately 1999. She did not take hormone replacement.  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 She will need to be on disability throughout her treatment. She is single, lives alone with her [place]. The patient's son Denise Becker died at the age of 7 with acute leukemia. The patient has no grandchildren.    ADVANCED DIRECTIVES: Not in place, but the patient was given the appropriate documents to complete and notarize on her 03/16/2013 visit. She intends to name her  cousin, Denise Becker, as her healthcare power of attorney. She can be reached at [place]:  (Updated 06/02/2013) History  Substance Use Topics  . Smoking status: Former Smoker -- 1.00 packs/day    Types: Cigarettes    Quit date: 04/12/2001  . Smokeless tobacco: Never Used  . Alcohol Use: Yes     Comment: occasional wine     Colonoscopy: Not on file  PAP: UTD/Dr. Delsa Sale  Bone density:  Never  Lipid panel:  Dec 2014/Dr. Jackson-Moore   No Known Allergies  Current Outpatient Prescriptions  Medication Sig Dispense Refill  . amLODipine-olmesartan (AZOR) 10-40 MG per tablet Take 1 tablet by mouth every morning.      Marland Kitchen dexamethasone (DECADRON) 4 MG tablet 2 tabs by mouth twice daily with food on day before and 3 days after each chemo dose  30 tablet  2  . hydrocortisone 2.5 % cream Apply 1 application topically 2 (two) times daily.      Marland Kitchen lidocaine-prilocaine (EMLA) cream Apply 1 application topically daily as needed. Pain port access 1-2 hours before chemo      . LORazepam (ATIVAN) 0.5 MG tablet Take 0.5 mg by mouth. Take 1 -2 tabs at night as needed for sleep      . Multiple Vitamin (MULTIVITAMIN) capsule Take 1 capsule by mouth daily.      Marland Kitchen  naproxen sodium (ANAPROX) 220 MG tablet Take 220 mg by mouth 2 (two) times daily as needed.      Marland Kitchen omeprazole (PRILOSEC) 20 MG capsule Take 1 capsule (20 mg total) by mouth daily.  30 capsule  2  . HYDROcodone-acetaminophen (NORCO/VICODIN) 5-325 MG per tablet Take 1-2 tablets by mouth every 6 (six) hours as needed for moderate pain.      . Loratadine (CLARITIN PO) Take 1 tablet by mouth.      . ondansetron (ZOFRAN) 8 MG tablet Take 8 mg by mouth 2 (two) times daily as needed for nausea or vomiting.      . prochlorperazine (COMPAZINE) 10 MG tablet Take 10 mg by mouth every 6 (six) hours as needed for nausea or vomiting. For 3 days after chemo       No current facility-administered medications for this visit.     OBJECTIVE: Middle-aged [place] American woman who appears comfortable and is in no acute distress Filed Vitals:   06/21/13 1319  BP: 134/82  Pulse: 85  Temp: 98.3 F (36.8 C)  Resp: 18     Body mass index is 40.63 kg/(m^2).    ECOG FS:0 - Asymptomatic Filed Weights   06/21/13 1319  Weight: 214 lb 14.4 oz (97.478 kg)   Physical Exam: HEENT:  Sclerae anicteric. Oropharynx is  pink, and moist. Poor dentition. No mucositis or candidiasis.  Neck supple, trachea midline.  NODES:  No cervical or supraclavicular lymphadenopathy palpated.  BREAST EXAM: Deferred. (MRI scheduled for early next week) Axillae are benign bilaterally, with no palpable lymphadenopathy.  LUNGS:  Clear to auscultation bilaterally with good excursion. No dullness to percussion. No crackles, wheezes or rhonchi HEART:  Regular rate and rhythm. No murmur appreciated. ABDOMEN:  Soft, obese, nontender. No organomegaly palpated. No guarding or rebound. Positive bowel sounds.  MSK:  No focal spinal tenderness to palpation. Good range of motion bilaterally in the upper extremities with no significant discomfort. EXTREMITIES:  1+ pitting edema bilaterally in the lower extremities, equal bilaterally. No erythema or palpable cords. No lymphedema noted in the upper extremities.  SKIN:  Notable hyperpigmentation, especially affecting the hands and nails. No drainage from beneath the nails, and no evidence of infection. No additional rashes or skin lesions noted today. No excessive ecchymoses, and no petechiae. Port is intact in the right upper chest wall with no erythema, edema, or signs of infection/cellulitis. NEURO:  Nonfocal. Well oriented.  Appropriate affect.   LAB RESULTS:   Lab Results  Component Value Date   WBC 21.4* 06/21/2013   NEUTROABS 19.5* 06/21/2013   HGB 12.5 06/21/2013   HCT 38.6 06/21/2013   MCV 83.7 06/21/2013   PLT 453* 06/21/2013      Chemistry      Component Value Date/Time   NA 142 06/14/2013 1027    NA 141 04/04/2013 1031   K 4.3 06/14/2013 1027   K 3.6* 04/04/2013 1031   CL 103 04/04/2013 1031   CO2 25 06/14/2013 1027   CO2 27 04/04/2013 1031   BUN 19.8 06/14/2013 1027   BUN 16 04/04/2013 1031   CREATININE 1.2* 06/14/2013 1027   CREATININE 0.81 04/04/2013 1031   CREATININE 0.75 03/02/2013 1135      Component Value Date/Time   CALCIUM 9.9 06/14/2013 1027   CALCIUM 9.3 04/04/2013 1031   ALKPHOS 102 06/14/2013 1027   ALKPHOS 74 04/04/2013 1031   AST 10 06/14/2013 1027   AST 16 04/04/2013 1031   ALT 21 06/14/2013 1027  ALT 23 04/04/2013 1031   BILITOT <0.20 06/14/2013 1027   BILITOT 0.3 04/04/2013 1031       STUDIES:  Echocardiogram on 03/29/2013 showed an ejection fraction of 55-60%.    Dg Chest Portable 1 View 04/29/2013   CLINICAL DATA:  Breast cancer.  Port-A-Cath revision.  EXAM: PORTABLE CHEST - 1 VIEW  COMPARISON:  Chest x-ray dated 04/15/2013  FINDINGS: Port-A-Cath has been revised. The tip is at the cavoatrial junction in good position. There is mild chronic cardiomegaly. Minimal linear atelectasis at the left lung base. Lungs are otherwise clear. No pneumothorax. No osseous abnormality.  IMPRESSION: Revision of Port-A-Cath as described. Minimal atelectasis at the left lung base.   Electronically Signed   By: Rozetta Nunnery M.D.   On: 04/29/2013 12:48     ASSESSMENT: 63 y.o. [place] woman   (1)  status post right breast biopsy 03/04/2013 for a clinical T3 N1, stage IIIA invasive ductal carcinoma, grade 3, estrogen receptor 100% positive, progesterone receptor 100% positive, with an MIB-1 of 20% and no HER-2 amplification.  (2) right axillary lymph node biopsy 03/04/2013 was benign   (3) status post Port-A-Cath remission on 04/29/2013  (4) Neoadjuvant chemotherapy with docetaxel doxorubicin and cyclophosphamide - start date  on 05/03/2013 to be repeated once every 3 weeks for 6 cycles.   (5) Neulasta on day 2 of each cycle for granulocyte support  (6)  patient will likely need a  mastectomy following neoadjuvant chemotherapy.  (7) GERD -  controlled with omeprazole  (8) history of right shoulder dislocation with intermittent right shoulder pain - controlled with Aleve  (9)  swelling in the lower extremities (Doppler study of the right lower extremity was negative for a DVT on 06/02/2013.)    PLAN: Denise Becker will proceed to treatment today as scheduled for her third neoadjuvant cycle of docetaxel/doxorubicin/cyclophosphamide, and will return tomorrow for her Neulasta injection on day 2. Overall, she is tolerating treatment very well. I have recommended that she keep her feet elevated as much as possible when she is sitting, and also suggested that she continue keeping herself well hydrated and watch her sodium intake. Otherwise, we are making no changes to her current regimen.   Denise Becker will have a repeat breast MRI next week on March 30 2 assess response to therapy thus far. She will see Dr. Jana Hakim to review those results on April 1, and I will see her again in 3 weeks from now on April 14 anticipation of her fourth cycle of chemotherapy. The plan is to complete a total of 6 cycles to be followed by definitive surgery, likely a mastectomy.  Denise Becker voices understanding and agreement with the above plan. She knows to call with any changes or problems prior to her next scheduled appointment.    Durene Fruits  Medical oncology   06/21/2013 1:55 PM

## 2013-06-22 ENCOUNTER — Ambulatory Visit (HOSPITAL_BASED_OUTPATIENT_CLINIC_OR_DEPARTMENT_OTHER): Payer: 59

## 2013-06-22 VITALS — BP 124/64 | HR 83 | Temp 98.1°F

## 2013-06-22 DIAGNOSIS — C50419 Malignant neoplasm of upper-outer quadrant of unspecified female breast: Secondary | ICD-10-CM

## 2013-06-22 DIAGNOSIS — C50411 Malignant neoplasm of upper-outer quadrant of right female breast: Secondary | ICD-10-CM

## 2013-06-22 DIAGNOSIS — Z5189 Encounter for other specified aftercare: Secondary | ICD-10-CM

## 2013-06-22 MED ORDER — PEGFILGRASTIM INJECTION 6 MG/0.6ML
6.0000 mg | Freq: Once | SUBCUTANEOUS | Status: AC
  Administered 2013-06-22: 6 mg via SUBCUTANEOUS
  Filled 2013-06-22: qty 0.6

## 2013-06-26 ENCOUNTER — Encounter: Payer: Self-pay | Admitting: Oncology

## 2013-06-27 ENCOUNTER — Telehealth: Payer: Self-pay | Admitting: Oncology

## 2013-06-27 ENCOUNTER — Telehealth: Payer: Self-pay | Admitting: *Deleted

## 2013-06-27 ENCOUNTER — Other Ambulatory Visit: Payer: Self-pay | Admitting: Oncology

## 2013-06-27 ENCOUNTER — Non-Acute Institutional Stay (HOSPITAL_COMMUNITY)
Admission: AD | Admit: 2013-06-27 | Discharge: 2013-06-27 | Disposition: A | Discharge status: Home / Self Care | Payer: 59 | Source: Ambulatory Visit | Attending: Oncology | Admitting: Oncology

## 2013-06-27 ENCOUNTER — Other Ambulatory Visit: Payer: Self-pay | Admitting: *Deleted

## 2013-06-27 ENCOUNTER — Ambulatory Visit (HOSPITAL_BASED_OUTPATIENT_CLINIC_OR_DEPARTMENT_OTHER): Payer: 59

## 2013-06-27 ENCOUNTER — Ambulatory Visit
Admission: RE | Admit: 2013-06-27 | Discharge: 2013-06-27 | Disposition: A | Discharge status: Home / Self Care | Payer: 59 | Source: Ambulatory Visit | Attending: Physician Assistant | Admitting: Physician Assistant

## 2013-06-27 DIAGNOSIS — E876 Hypokalemia: Secondary | ICD-10-CM

## 2013-06-27 DIAGNOSIS — C50411 Malignant neoplasm of upper-outer quadrant of right female breast: Secondary | ICD-10-CM

## 2013-06-27 DIAGNOSIS — C50419 Malignant neoplasm of upper-outer quadrant of unspecified female breast: Secondary | ICD-10-CM | POA: Insufficient documentation

## 2013-06-27 DIAGNOSIS — D702 Other drug-induced agranulocytosis: Secondary | ICD-10-CM | POA: Insufficient documentation

## 2013-06-27 DIAGNOSIS — I10 Essential (primary) hypertension: Secondary | ICD-10-CM

## 2013-06-27 DIAGNOSIS — F4323 Adjustment disorder with mixed anxiety and depressed mood: Secondary | ICD-10-CM

## 2013-06-27 LAB — COMPREHENSIVE METABOLIC PANEL (CC13)
ALBUMIN: 3.2 g/dL — AB (ref 3.5–5.0)
ALK PHOS: 88 U/L (ref 40–150)
ALT: 104 U/L — ABNORMAL HIGH (ref 0–55)
AST: 38 U/L — ABNORMAL HIGH (ref 5–34)
Anion Gap: 9 mEq/L (ref 3–11)
BILIRUBIN TOTAL: 0.86 mg/dL (ref 0.20–1.20)
BUN: 21.1 mg/dL (ref 7.0–26.0)
CO2: 26 mEq/L (ref 22–29)
Calcium: 9.1 mg/dL (ref 8.4–10.4)
Chloride: 105 mEq/L (ref 98–109)
Creatinine: 0.9 mg/dL (ref 0.6–1.1)
Glucose: 102 mg/dl (ref 70–140)
Potassium: 3 mEq/L — CL (ref 3.5–5.1)
SODIUM: 141 meq/L (ref 136–145)
TOTAL PROTEIN: 6.5 g/dL (ref 6.4–8.3)

## 2013-06-27 LAB — CBC WITH DIFFERENTIAL/PLATELET
BASO%: 1.6 % (ref 0.0–2.0)
BASOS ABS: 0 10*3/uL (ref 0.0–0.1)
EOS%: 0.8 % (ref 0.0–7.0)
Eosinophils Absolute: 0 10*3/uL (ref 0.0–0.5)
HCT: 39.2 % (ref 34.8–46.6)
HEMOGLOBIN: 13.1 g/dL (ref 11.6–15.9)
LYMPH%: 66.1 % — ABNORMAL HIGH (ref 14.0–49.7)
MCH: 27.3 pg (ref 25.1–34.0)
MCHC: 33.4 g/dL (ref 31.5–36.0)
MCV: 81.7 fL (ref 79.5–101.0)
MONO#: 0.2 10*3/uL (ref 0.1–0.9)
MONO%: 16.1 % — ABNORMAL HIGH (ref 0.0–14.0)
NEUT#: 0.2 10*3/uL — CL (ref 1.5–6.5)
NEUT%: 15.4 % — ABNORMAL LOW (ref 38.4–76.8)
Platelets: 182 10*3/uL (ref 145–400)
RBC: 4.8 10*6/uL (ref 3.70–5.45)
RDW: 16.9 % — AB (ref 11.2–14.5)
WBC: 1.2 10*3/uL — ABNORMAL LOW (ref 3.9–10.3)
lymph#: 0.8 10*3/uL — ABNORMAL LOW (ref 0.9–3.3)
nRBC: 0 % (ref 0–0)

## 2013-06-27 LAB — MAGNESIUM (CC13): Magnesium: 2 mg/dl (ref 1.5–2.5)

## 2013-06-27 MED ORDER — HEPARIN SOD (PORK) LOCK FLUSH 100 UNIT/ML IV SOLN
500.0000 [IU] | Freq: Once | INTRAVENOUS | Status: DC
  Filled 2013-06-27: qty 5

## 2013-06-27 MED ORDER — SODIUM CHLORIDE 0.9 % IJ SOLN: 10.0000 mL | INTRAMUSCULAR | Status: DC | PRN

## 2013-06-27 MED ORDER — SODIUM CHLORIDE 0.9 % IV SOLN
INTRAVENOUS | Status: DC
  Administered 2013-06-27: 17:00:00 via INTRAVENOUS
  Filled 2013-06-27 (×3): qty 500

## 2013-06-27 MED ORDER — POTASSIUM CHLORIDE 10 MEQ/100ML IV SOLN
10.0000 meq | Freq: Once | INTRAVENOUS | Status: AC
  Administered 2013-06-27: 10 meq via INTRAVENOUS
  Filled 2013-06-27: qty 100

## 2013-06-27 MED ORDER — CIPROFLOXACIN HCL 500 MG PO TABS: 500.0000 mg | ORAL_TABLET | Freq: Two times a day (BID) | ORAL | Status: DC

## 2013-06-27 MED ORDER — POTASSIUM CHLORIDE IN NACL 20-0.9 MEQ/L-% IV SOLN: INTRAVENOUS | Status: DC

## 2013-06-27 NOTE — Telephone Encounter (Signed)
, °

## 2013-06-27 NOTE — Telephone Encounter (Signed)
   Provider input needed: Diarrhea, Weakness, breast MRI and F/U scheduling   Reason for call: Lattie Haw with [facility] called due to unable to perform today's test.  "She is nauseated, weak and sweaty.  What can we do for this patient at this time to help her feel better."  Constitutional: positive for fatigue and sweats Gastrointestinal: positive for abdominal pain, diarrhea, nausea and abdomin sore Unable to have MRI done today, patient in wheelchair and brought to the phone.   ALLERGIES:  has No Known Allergies.  Patient last received chemotherapy/ treatment on 06-21-2013 TAC  Patient was last seen in the office on 06-21-2013  Next appt is 06-29-2013  Is patient having fevers greater than 100.5?  no   Is patient having uncontrolled pain, or new pain? no, Reports abdomen was cramping but now feels better or just sore   Is patient having new back pain that changes with position (worsens or eases when laying down?)  no   Is patient able to eat and drink? yes    Is patient able to pass stool without difficulty?   yes, Diarrhea since chemotherapy.  Has taken "pepto-bismal for two days which has cut it down some.  Friday had about seven stools, Saturday went about ten times.  Whatever I eat doesn't stay in me."    Is patient having uncontrolled nausea?  no    patient calls 06/27/2013 with complaint of  Constitutional: positive for fatigue and sweats Gastrointestinal: positive for abdominal pain and diarrhea   Summary Based on the above information advised patient to  Come to office and wait for instructions or the office will call.  She asked that we call her mobile number [phone].  "I can't help how I feel.  I'm lying on my stomach and this bother's me so I can't go through this test today."     [name], [name]  06/27/2013, 12:37 PM   Background Info  Denise Becker   DOB: 1950/05/05   MR#: [id number]   CSN#   [id number] 06/27/2013

## 2013-06-27 NOTE — Telephone Encounter (Deleted)
   Provider input needed: ***   Reason for call: ***  {Ros - complete:30496}   ALLERGIES:  has No Known Allergies.  Patient last received chemotherapy/ treatment on {NA AND CWCBJSEG:31517}  Patient was last seen in the office on ***  Next appt is ***  Is patient having fevers greater than 100.5?  {YES/NO/WILD OHYWV:37106}   Is patient having uncontrolled pain, or new pain? {YES/NO/WILD YIRSW:54627}   Is patient having new back pain that changes with position (worsens or eases when laying down?)  {YES/NO/WILD CARDS:18581}   Is patient able to eat and drink? {YES/NO/WILD OJJKK:93818}    Is patient able to pass stool without difficulty?   {YES/NO/WILD EXHBZ:16967}     Is patient having uncontrolled nausea?  {YES/NO/WILD ELFYB:01751}    {Patient/Family/Caregiver:109081} calls 06/27/2013 with complaint of  {Ros - complete:30496}   Summary Based on the above information advised {Patient/Family/Caregiver:109081} to  ***   [name], Lavaughn Haberle  06/27/2013, 1:30 PM   Background Info  MARCENE LASKOWSKI   DOB: Mar 23, 1951   MR#: [id number]   CSN#   [id number] 06/27/2013

## 2013-06-27 NOTE — Telephone Encounter (Signed)
Roz, I would bring her in for NS 500 cc with 20 mEq KCl/ L and cbc, cmet, magnesium; please have them call me when she is in treatment area and I will go back there to see her

## 2013-06-27 NOTE — Telephone Encounter (Signed)
Received orders from Dr. Jana Hakim via phone note and verbal order received and read back.  Dr. Jana Hakim ordered to bring her in for NS 500 cc with 20 mEq KCl/ L and cbc, cmet, magnesium; please have them call me when she is in treatment area and I will go back there to see her

## 2013-06-27 NOTE — Procedures (Signed)
[facility]  Procedure Note  Denise Becker HQI:[id number] DOB: 24-Jul-1950 DOA: 06/27/2013   PCP: Dr. Jana Hakim   Associated Diagnosis: Breast cancer of upper-outer quadrant of right female breast - Primary 174.4 Essential hypertension, benign 401.1 Adjustment disorder with mixed anxiety and depressed mood 309.28 Hypokalemia    Procedure Note: port accessed, IV fluids with potassium given per order, port flushed and deaccessed per orders.   Condition During Procedure: patient denies any discomfort   Condition at Discharge: Patient stable at discharge.  Patient reminded that per Amy at the cancer center, and antibiotic has been called in to her pharmacy d/t low WBC's.  Patient verbalizes understanding.   Roberto Scales, RN  [facility]

## 2013-06-27 NOTE — Progress Notes (Signed)
Spoke with Tiffany at [facility], she states she needs orders for Heparin and Saline to flush port. Orders entered under sign and held. Also asked to share with patient Neutropenic precautions and that we will be calling in an antibiotic for her.

## 2013-06-27 NOTE — Telephone Encounter (Signed)
Diregard, No new note

## 2013-06-29 ENCOUNTER — Ambulatory Visit (HOSPITAL_BASED_OUTPATIENT_CLINIC_OR_DEPARTMENT_OTHER): Payer: 59 | Admitting: Oncology

## 2013-06-29 ENCOUNTER — Telehealth: Payer: Self-pay | Admitting: Oncology

## 2013-06-29 ENCOUNTER — Other Ambulatory Visit (HOSPITAL_BASED_OUTPATIENT_CLINIC_OR_DEPARTMENT_OTHER): Payer: 59

## 2013-06-29 VITALS — BP 122/80 | HR 97 | Temp 98.1°F | Resp 18 | Ht 61.0 in | Wt 204.4 lb

## 2013-06-29 DIAGNOSIS — Z170 Estrogen receptor positive status [ER+]: Secondary | ICD-10-CM

## 2013-06-29 DIAGNOSIS — C50419 Malignant neoplasm of upper-outer quadrant of unspecified female breast: Secondary | ICD-10-CM

## 2013-06-29 DIAGNOSIS — M25519 Pain in unspecified shoulder: Secondary | ICD-10-CM

## 2013-06-29 DIAGNOSIS — M7989 Other specified soft tissue disorders: Secondary | ICD-10-CM

## 2013-06-29 DIAGNOSIS — D702 Other drug-induced agranulocytosis: Secondary | ICD-10-CM

## 2013-06-29 DIAGNOSIS — C50411 Malignant neoplasm of upper-outer quadrant of right female breast: Secondary | ICD-10-CM

## 2013-06-29 DIAGNOSIS — F4323 Adjustment disorder with mixed anxiety and depressed mood: Secondary | ICD-10-CM

## 2013-06-29 DIAGNOSIS — K219 Gastro-esophageal reflux disease without esophagitis: Secondary | ICD-10-CM

## 2013-06-29 DIAGNOSIS — I10 Essential (primary) hypertension: Secondary | ICD-10-CM

## 2013-06-29 LAB — CBC WITH DIFFERENTIAL/PLATELET
BASO%: 0.8 % (ref 0.0–2.0)
BASOS ABS: 0.1 10*3/uL (ref 0.0–0.1)
EOS%: 0.4 % (ref 0.0–7.0)
Eosinophils Absolute: 0 10*3/uL (ref 0.0–0.5)
HCT: 38.4 % (ref 34.8–46.6)
HGB: 13.1 g/dL (ref 11.6–15.9)
LYMPH%: 23.7 % (ref 14.0–49.7)
MCH: 27.8 pg (ref 25.1–34.0)
MCHC: 34.1 g/dL (ref 31.5–36.0)
MCV: 81.5 fL (ref 79.5–101.0)
MONO#: 1.4 10*3/uL — ABNORMAL HIGH (ref 0.1–0.9)
MONO%: 18.3 % — AB (ref 0.0–14.0)
NEUT#: 4.2 10*3/uL (ref 1.5–6.5)
NEUT%: 56.8 % (ref 38.4–76.8)
NRBC: 1 % — AB (ref 0–0)
Platelets: 167 10*3/uL (ref 145–400)
RBC: 4.71 10*6/uL (ref 3.70–5.45)
RDW: 16.5 % — AB (ref 11.2–14.5)
WBC: 7.4 10*3/uL (ref 3.9–10.3)
lymph#: 1.8 10*3/uL (ref 0.9–3.3)

## 2013-06-29 NOTE — Telephone Encounter (Signed)
, °

## 2013-06-29 NOTE — Progress Notes (Signed)
ID: [name] OB: 1950-05-06  MR#: [id number]  CSN#:[id number]  PCP: Ron Parker, MD GYN:  Lahoma Crocker SU: Fanny Skates OTHER MD: Thea Silversmith   CURRENT THERAPY: Neodjuvant chemotherapy DOCETAXEL/ADRIAMYCIN/CYTOXAN (TAC)  CHIEF COMPLAINT: Right breast cancer/neoadjuvant chemotherapy  HISTORY OF PRESENT ILLNESS: As per the previously documented note: Khailee palpated a change in her right breast sometime in August or September 2014. She was aware that this might be significant, but waited on meds until she saw her gynecologist for a routine visit. Dr. Delsa Sale immediately he set her up for right mammography and ultrasonography performed 03/03/2013. Mammography found an area of density in the upper outer quadrant of the right breast measuring 5 cm in the with right nipple retraction. There was no evidence of skin thickening. On exam there was a firm palpable mass in the superior right breast extending from 1:00 to 9:00. The right nipple was completely retracted. There was no skin thickening. The right axilla was benign by palpation. Ultrasound showed a prominent irregular hypoechoic mass larger than the ultrasound screen. Ultrasound of the right axilla showed 2 adjacent small lymph nodes with diffusely thickened cortices, the largest measuring 1.1 cm.  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 There was no HER-2 amplification with a ratio of 1.46 by CISH and an average copy number of 2.85.  On 03/11/2013 the patient underwent bilateral breast MRI. This showed an irregular enhancing mass in the central to upper right breast with nipple retraction measuring in total 6.8  cm. There were several adjacent enhancing nodules as well. There was no involvement of the pectoralis muscle. There were no definite morphologically abnormal right axillary lymph nodes there was no definite internal mammary or lymphadenopathy. Left breast was unremarkable.  The patient's subsequent history is as detailed below   INTERVAL HISTORY: Makyia returns today for followup of her breast cancer. Today is day 7 cycle 3 of doxorubicin, docetaxel and cyclophosphamide, which he receives every 21 days with Neulasta support on day 2.  REVIEW OF SYSTEMS: Chiyeko develop significant diarrhea the past few days, after being started on Cipro. She did not have diarrhea problems with her first 2 cycles of chemotherapy. She also did not receive Cipro after those cycles, since her neutrophil count was not as low. She tried to control with Pepto-Bismol but that did not work. She felt very weak and was not able to have the breast MRI scheduled for 3:30. Instead she came that day and had I. intravenous fluids and potassium supplementation. That helped a little bit and she is beginning to feel better. She has had 3 loose bowel movement this morning (as opposed to "about 10" before, in her normal being about 2. She still has an upset stomach, poor appetite, and some abdominal cramps. Aside from this a detailed review of systems today was stable  PAST MEDICAL HISTORY: Past Medical History  Diagnosis Date  . Anxiety   . Depression     history of depression after son died  . Breast cancer of upper-outer quadrant of right female breast 03/10/2013  . Hypertension     PAST SURGICAL HISTORY: Past Surgical History  Procedure Laterality Date  . Abdominal hysterectomy    . Multiple tooth extractions Bilateral   .  Tonsillectomy    . Cesarean section    . Portacath placement Right 04/15/2013    Procedure: INSERTION PORT-A-CATH;  Surgeon: Adin Hector, MD;  Location: [facility];  Service:  General;  Laterality: Right;  . Port a cath revision Right 04/29/2013    Procedure: PORT A CATH REVISION;  Surgeon: Adin Hector, MD;  Location: [facility];  Service: General;  Laterality: Right;    FAMILY HISTORY Family History  Problem Relation Age of Onset  . Cancer Mother   . Hypertension Mother   . Bowel Disease Mother   . Lung disease Father   . Heart disease Maternal Grandmother   . Heart disease Maternal Grandfather    the patient's mother,Flowrene Millings, had a lung mass but apparently died from unrelated causes (she was followed by Dr. Earlie Server here). She was 64 years old. The patient has little information about her father. The patient had 2 brothers, no sisters. There is no history of breast or ovarian cancer in the family to her knowledge.  GYNECOLOGIC HISTORY:  Menarche age 74, first live birth age 45, the patient is GX P1. She went through the change of life approximately 1999. She did not take hormone replacement.  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 She is on disability throughout her treatment. She is single, lives alone with her Clio. The patient's son Helen Hashimoto died at the age of 30 with acute leukemia. The patient has no grandchildren.    ADVANCED DIRECTIVES: Not in place, but the patient was given the appropriate documents to complete and notarize on her 03/16/2013 visit. She intends to name her cousin, Sharrie Rothman, as her healthcare power of attorney. She can be reached at [place]:  (Updated 06/02/2013) History  Substance Use Topics  . Smoking status: Former Smoker -- 1.00 packs/day    Types: Cigarettes    Quit date: 04/12/2001  . Smokeless tobacco: Never Used  . Alcohol Use: Yes     Comment: occasional wine     Colonoscopy: Not on file  PAP: UTD/Dr. Delsa Sale  Bone density:  Never  Lipid panel:  Dec 2014/Dr.  Jackson-Moore   No Known Allergies  Current Outpatient Prescriptions  Medication Sig Dispense Refill  . amLODipine-olmesartan (AZOR) 10-40 MG per tablet Take 1 tablet by mouth every morning.      . ciprofloxacin (CIPRO) 500 MG tablet Take 1 tablet (500 mg total) by mouth 2 (two) times daily.  10 tablet  0  . dexamethasone (DECADRON) 4 MG tablet 2 tabs by mouth twice daily with food on day before and 3 days after each chemo dose  30 tablet  2  . HYDROcodone-acetaminophen (NORCO/VICODIN) 5-325 MG per tablet Take 1-2 tablets by mouth every 6 (six) hours as needed for moderate pain.      . hydrocortisone 2.5 % cream Apply 1 application topically 2 (two) times daily.      Marland Kitchen lidocaine-prilocaine (EMLA) cream Apply 1 application topically daily as needed. Pain port access 1-2 hours before chemo      . Loratadine (CLARITIN PO) Take 1 tablet by mouth.      Marland Kitchen LORazepam (ATIVAN) 0.5 MG tablet Take 0.5 mg by mouth. Take 1 -2 tabs at night as needed for sleep      . Multiple Vitamin (MULTIVITAMIN) capsule Take 1 capsule by mouth daily.      . naproxen sodium (  ANAPROX) 220 MG tablet Take 220 mg by mouth 2 (two) times daily as needed.      Marland Kitchen omeprazole (PRILOSEC) 20 MG capsule Take 1 capsule (20 mg total) by mouth daily.  30 capsule  2  . ondansetron (ZOFRAN) 8 MG tablet Take 8 mg by mouth 2 (two) times daily as needed for nausea or vomiting.      . Potassium Chloride in NaCl (0.9 % NACL WITH KCL 20 MEQ / L) 20-0.9 MEQ/L-% Please bring patient in for 20 meq potassium chloride in 500 ml 0.9% Normal saline.  Have patient return to [facility] after completion of IVF.  500 mL  ml  . prochlorperazine (COMPAZINE) 10 MG tablet Take 10 mg by mouth every 6 (six) hours as needed for nausea or vomiting. For 3 days after chemo       No current facility-administered medications for this visit.    OBJECTIVE: Middle-aged [place] American woman who appears stated age 26 Vitals:   06/29/13 1430  BP: 122/80  Pulse: 97   Temp: 98.1 F (36.7 C)  Resp: 18     Body mass index is 38.64 kg/(m^2).    ECOG FS:2 - Symptomatic, <50% confined to bed Filed Weights   06/29/13 1430  Weight: 204 lb 6.4 oz (92.715 kg)   Sclerae unicteric, EOMs intact Oropharynx clear and moist No cervical or supraclavicular adenopathy Lungs no rales or rhonchi Heart regular rate and rhythm Abd soft, obese, nontender, positive bowel sounds MSK no focal spinal tenderness, no upper extremity lymphedema Neuro: nonfocal, well oriented, appropriate affect Breasts: The right breast mass appears considerably reduced and softer. I do believe it still palpate approximately a 2-3 cm mass in the right breast. The nipple remains inverted. I do not palpate any axillary mass on the right. The left breast is unremarkable  LAB RESULTS:   Lab Results  Component Value Date   WBC 7.4 06/29/2013   NEUTROABS 4.2 06/29/2013   HGB 13.1 06/29/2013   HCT 38.4 06/29/2013   MCV 81.5 06/29/2013   PLT 167 06/29/2013      Chemistry      Component Value Date/Time   NA 141 06/27/2013 1456   NA 141 04/04/2013 1031   K 3.0* 06/27/2013 1456   K 3.6* 04/04/2013 1031   CL 103 04/04/2013 1031   CO2 26 06/27/2013 1456   CO2 27 04/04/2013 1031   BUN 21.1 06/27/2013 1456   BUN 16 04/04/2013 1031   CREATININE 0.9 06/27/2013 1456   CREATININE 0.81 04/04/2013 1031   CREATININE 0.75 03/02/2013 1135      Component Value Date/Time   CALCIUM 9.1 06/27/2013 1456   CALCIUM 9.3 04/04/2013 1031   ALKPHOS 88 06/27/2013 1456   ALKPHOS 74 04/04/2013 1031   AST 38* 06/27/2013 1456   AST 16 04/04/2013 1031   ALT 104* 06/27/2013 1456   ALT 23 04/04/2013 1031   BILITOT 0.86 06/27/2013 1456   BILITOT 0.3 04/04/2013 1031       STUDIES:  Echocardiogram on 03/29/2013 showed an ejection fraction of 55-60%.   ASSESSMENT: 63 y.o. [place] woman   (1)  status post right breast biopsy 03/04/2013 for a clinical T3 N1, stage IIIA invasive ductal carcinoma, grade 3, estrogen receptor 100% positive,  progesterone receptor 100% positive, with an MIB-1 of 20% and no HER-2 amplification.  (2) right axillary lymph node biopsy 03/04/2013 was benign   (3) status post Port-A-Cath remission on 04/29/2013  (4) Neoadjuvant chemotherapy with docetaxel doxorubicin and cyclophosphamide -  start date  on 05/03/2013 to be repeated once every 3 weeks for 6 cycles.   (5) Neulasta on day 2 of each cycle for granulocyte support  (6)  patient will likely need a mastectomy following neoadjuvant chemotherapy.  (7) GERD -  controlled with omeprazole  (8) history of right shoulder dislocation with intermittent right shoulder pain - controlled with Aleve  (9)  swelling in the lower extremities (Doppler study of the right lower extremity was negative for a DVT on 06/02/2013.)    PLAN: Mitchelle's diarrhea I believe he is going to be due to the ciprofloxacin she received for neutropenia. She did not get this with the first 2 cycles and she had no diarrhea with those. Accordingly I have entered that as a "allergy", but it is really more of an intolerance. This was discussed with her in detail today. Since her counts have recovered I have also suggested she go ahead and stop the Cipro at this point.  If she develops diarrhea in the future, she will try Imodium instead of Pepto-Bismol.  I have rescheduled the breast MRI hopefully for this coming week. She will see [name] again on the 14th and we can discuss those results with her then. Of course she will have her fourth cycle of chemotherapy of 6 planned at that point.  She had an appointment with Dr. Dalbert Batman the same day as her next chemotherapy, and she tells me she will not be able to make it, its "too much". I have sent him a note requesting that he see her later that week.  At this point based on physical exam I am encouraged that Anacristina is having a good response to her chemotherapy. We will know more once she has her MRI. She is beginning to think about surgical  options and whether she will 1 reconstruction as she has a mastectomy. She will discuss this further with Dr. Dalbert Batman.  She knows to call for any problems that may develop before next visit here.  Chauncey Cruel, MD  Medical oncology   06/29/2013 2:43 PM

## 2013-06-30 ENCOUNTER — Ambulatory Visit: Payer: 59

## 2013-07-01 ENCOUNTER — Ambulatory Visit: Payer: 59

## 2013-07-06 ENCOUNTER — Ambulatory Visit: Payer: 59

## 2013-07-08 ENCOUNTER — Ambulatory Visit
Admission: RE | Admit: 2013-07-08 | Discharge: 2013-07-08 | Disposition: A | Discharge status: Home / Self Care | Payer: 59 | Source: Ambulatory Visit | Attending: Physician Assistant | Admitting: Physician Assistant

## 2013-07-08 MED ORDER — GADOBENATE DIMEGLUMINE 529 MG/ML IV SOLN
20.0000 mL | Freq: Once | INTRAVENOUS | Status: AC | PRN
  Administered 2013-07-08: 20 mL via INTRAVENOUS

## 2013-07-11 ENCOUNTER — Encounter: Payer: Self-pay | Admitting: Oncology

## 2013-07-11 NOTE — Progress Notes (Signed)
Patient requested copies of 06/21/13 service dates. Left message for her to pick up at visit on Tuesday or I can mail to where they need to go.

## 2013-07-12 ENCOUNTER — Ambulatory Visit (HOSPITAL_BASED_OUTPATIENT_CLINIC_OR_DEPARTMENT_OTHER): Payer: 59 | Admitting: Physician Assistant

## 2013-07-12 ENCOUNTER — Ambulatory Visit (HOSPITAL_BASED_OUTPATIENT_CLINIC_OR_DEPARTMENT_OTHER): Payer: 59

## 2013-07-12 ENCOUNTER — Encounter (INDEPENDENT_AMBULATORY_CARE_PROVIDER_SITE_OTHER): Payer: 59 | Admitting: General Surgery

## 2013-07-12 ENCOUNTER — Encounter: Payer: Self-pay | Admitting: Physician Assistant

## 2013-07-12 ENCOUNTER — Other Ambulatory Visit (HOSPITAL_BASED_OUTPATIENT_CLINIC_OR_DEPARTMENT_OTHER): Payer: 59

## 2013-07-12 VITALS — BP 146/73 | HR 96 | Temp 98.2°F | Resp 18 | Ht 61.0 in | Wt 220.0 lb

## 2013-07-12 DIAGNOSIS — K219 Gastro-esophageal reflux disease without esophagitis: Secondary | ICD-10-CM

## 2013-07-12 DIAGNOSIS — Z5111 Encounter for antineoplastic chemotherapy: Secondary | ICD-10-CM

## 2013-07-12 DIAGNOSIS — M7989 Other specified soft tissue disorders: Secondary | ICD-10-CM

## 2013-07-12 DIAGNOSIS — C50419 Malignant neoplasm of upper-outer quadrant of unspecified female breast: Secondary | ICD-10-CM

## 2013-07-12 DIAGNOSIS — R0609 Other forms of dyspnea: Secondary | ICD-10-CM

## 2013-07-12 DIAGNOSIS — C50411 Malignant neoplasm of upper-outer quadrant of right female breast: Secondary | ICD-10-CM

## 2013-07-12 DIAGNOSIS — Z170 Estrogen receptor positive status [ER+]: Secondary | ICD-10-CM

## 2013-07-12 DIAGNOSIS — R600 Localized edema: Secondary | ICD-10-CM

## 2013-07-12 DIAGNOSIS — R0989 Other specified symptoms and signs involving the circulatory and respiratory systems: Secondary | ICD-10-CM

## 2013-07-12 LAB — CBC WITH DIFFERENTIAL/PLATELET
BASO%: 0.1 % (ref 0.0–2.0)
Basophils Absolute: 0 10*3/uL (ref 0.0–0.1)
EOS%: 0 % (ref 0.0–7.0)
Eosinophils Absolute: 0 10*3/uL (ref 0.0–0.5)
HCT: 36.6 % (ref 34.8–46.6)
HGB: 11.6 g/dL (ref 11.6–15.9)
LYMPH%: 4.1 % — AB (ref 14.0–49.7)
MCH: 27.5 pg (ref 25.1–34.0)
MCHC: 31.8 g/dL (ref 31.5–36.0)
MCV: 86.2 fL (ref 79.5–101.0)
MONO#: 0.5 10*3/uL (ref 0.1–0.9)
MONO%: 2.4 % (ref 0.0–14.0)
NEUT%: 93.4 % — ABNORMAL HIGH (ref 38.4–76.8)
NEUTROS ABS: 19.4 10*3/uL — AB (ref 1.5–6.5)
PLATELETS: 439 10*3/uL — AB (ref 145–400)
RBC: 4.24 10*6/uL (ref 3.70–5.45)
RDW: 19.3 % — ABNORMAL HIGH (ref 11.2–14.5)
WBC: 20.8 10*3/uL — AB (ref 3.9–10.3)
lymph#: 0.9 10*3/uL (ref 0.9–3.3)

## 2013-07-12 LAB — COMPREHENSIVE METABOLIC PANEL (CC13)
ALK PHOS: 89 U/L (ref 40–150)
ALT: 36 U/L (ref 0–55)
AST: 16 U/L (ref 5–34)
Albumin: 3.6 g/dL (ref 3.5–5.0)
Anion Gap: 10 mEq/L (ref 3–11)
BUN: 13.2 mg/dL (ref 7.0–26.0)
CO2: 25 meq/L (ref 22–29)
Calcium: 10.3 mg/dL (ref 8.4–10.4)
Chloride: 110 mEq/L — ABNORMAL HIGH (ref 98–109)
Creatinine: 0.8 mg/dL (ref 0.6–1.1)
Glucose: 163 mg/dl — ABNORMAL HIGH (ref 70–140)
Potassium: 4.3 mEq/L (ref 3.5–5.1)
SODIUM: 145 meq/L (ref 136–145)
Total Bilirubin: <0.2 mg/dL (ref 0.20–1.20)
Total Protein: 7.6 g/dL (ref 6.4–8.3)

## 2013-07-12 MED ORDER — PALONOSETRON HCL INJECTION 0.25 MG/5ML
INTRAVENOUS | Status: AC
  Filled 2013-07-12: qty 5

## 2013-07-12 MED ORDER — SODIUM CHLORIDE 0.9 % IJ SOLN
10.0000 mL | INTRAMUSCULAR | Status: DC | PRN
  Administered 2013-07-12: 10 mL
  Filled 2013-07-12: qty 10

## 2013-07-12 MED ORDER — HEPARIN SOD (PORK) LOCK FLUSH 100 UNIT/ML IV SOLN
500.0000 [IU] | Freq: Once | INTRAVENOUS | Status: AC | PRN
  Administered 2013-07-12: 500 [IU]
  Filled 2013-07-12: qty 5

## 2013-07-12 MED ORDER — SODIUM CHLORIDE 0.9 % IV SOLN
500.0000 mg/m2 | Freq: Once | INTRAVENOUS | Status: AC
  Administered 2013-07-12: 1020 mg via INTRAVENOUS
  Filled 2013-07-12: qty 51

## 2013-07-12 MED ORDER — DOXORUBICIN HCL CHEMO IV INJECTION 2 MG/ML
50.0000 mg/m2 | Freq: Once | INTRAVENOUS | Status: AC
Start: 2013-07-12 — End: 2013-07-12
  Administered 2013-07-12: 102 mg via INTRAVENOUS
  Filled 2013-07-12: qty 51

## 2013-07-12 MED ORDER — PALONOSETRON HCL INJECTION 0.25 MG/5ML
0.2500 mg | Freq: Once | INTRAVENOUS | Status: AC
  Administered 2013-07-12: 0.25 mg via INTRAVENOUS

## 2013-07-12 MED ORDER — DEXAMETHASONE SODIUM PHOSPHATE 20 MG/5ML IJ SOLN
12.0000 mg | Freq: Once | INTRAMUSCULAR | Status: AC
  Administered 2013-07-12: 12 mg via INTRAVENOUS

## 2013-07-12 MED ORDER — DOCETAXEL CHEMO INJECTION 160 MG/16ML
75.0000 mg/m2 | Freq: Once | INTRAVENOUS | Status: AC
  Administered 2013-07-12: 150 mg via INTRAVENOUS
  Filled 2013-07-12: qty 15

## 2013-07-12 MED ORDER — SODIUM CHLORIDE 0.9 % IV SOLN
150.0000 mg | Freq: Once | INTRAVENOUS | Status: AC
  Administered 2013-07-12: 150 mg via INTRAVENOUS
  Filled 2013-07-12: qty 5

## 2013-07-12 MED ORDER — DEXAMETHASONE SODIUM PHOSPHATE 20 MG/5ML IJ SOLN
INTRAMUSCULAR | Status: AC
  Filled 2013-07-12: qty 5

## 2013-07-12 MED ORDER — SODIUM CHLORIDE 0.9 % IV SOLN
Freq: Once | INTRAVENOUS | Status: AC
  Administered 2013-07-12: 11:00:00 via INTRAVENOUS

## 2013-07-12 NOTE — Patient Instructions (Signed)
[facility] Discharge Instructions for Patients Receiving Chemotherapy  Today you received the following chemotherapy agents: Adriamycin, Cytoxan, Taxotere  To help prevent nausea and vomiting after your treatment, we encourage you to take your nausea medication as prescribed.    If you develop nausea and vomiting that is not controlled by your nausea medication, call the clinic.   BELOW ARE SYMPTOMS THAT SHOULD BE REPORTED IMMEDIATELY:  *FEVER GREATER THAN 100.5 F  *CHILLS WITH OR WITHOUT FEVER  NAUSEA AND VOMITING THAT IS NOT CONTROLLED WITH YOUR NAUSEA MEDICATION  *UNUSUAL SHORTNESS OF BREATH  *UNUSUAL BRUISING OR BLEEDING  TENDERNESS IN MOUTH AND THROAT WITH OR WITHOUT PRESENCE OF ULCERS  *URINARY PROBLEMS  *BOWEL PROBLEMS  UNUSUAL RASH Items with * indicate a potential emergency and should be followed up as soon as possible.  Feel free to call the clinic you have any questions or concerns. The clinic phone number is [phone].    

## 2013-07-12 NOTE — Progress Notes (Signed)
Blood return noted before, during, and after adriamycin push. No complaints per pt.

## 2013-07-12 NOTE — Progress Notes (Signed)
ID: [name] OB: January 11, 1951  MR#: [id number]  CSN#:[id number]  PCP: Ron Parker, MD GYN:  Lahoma Crocker SU: Fanny Skates OTHER MD: Thea Silversmith   CURRENT THERAPY: Neodjuvant chemotherapy DOCETAXEL/ADRIAMYCIN/CYTOXAN (TAC)  CHIEF COMPLAINT: Right breast cancer/neoadjuvant chemotherapy  HISTORY OF PRESENT ILLNESS: As per the previously documented note: Rosette palpated a change in her right breast sometime in August or September 2014. She was aware that this might be significant, but waited on meds until she saw her gynecologist for a routine visit. Dr. Delsa Sale immediately he set her up for right mammography and ultrasonography performed 03/03/2013. Mammography found an area of density in the upper outer quadrant of the right breast measuring 5 cm in the with right nipple retraction. There was no evidence of skin thickening. On exam there was a firm palpable mass in the superior right breast extending from 1:00 to 9:00. The right nipple was completely retracted. There was no skin thickening. The right axilla was benign by palpation. Ultrasound showed a prominent irregular hypoechoic mass larger than the ultrasound screen. Ultrasound of the right axilla showed 2 adjacent small lymph nodes with diffusely thickened cortices, the largest measuring 1.1 cm.  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 There was no HER-2 amplification with a ratio of 1.46 by CISH and an average copy number of 2.85.  On 03/11/2013 the patient underwent bilateral breast MRI. This showed an irregular enhancing mass in the central to upper right breast with nipple retraction measuring in total 6.8  cm. There were several adjacent enhancing nodules as well. There was no involvement of the pectoralis muscle. There were no definite morphologically abnormal right axillary lymph nodes there was no definite internal mammary or lymphadenopathy. Left breast was unremarkable.  The patient's subsequent history is as detailed below   INTERVAL HISTORY: Krishawna returns today accompanied by her aunt, Margie,for followup of Ary's locally advanced right breast cancer. She continues with her neoadjuvant therapy, and is due for day 1 cycle 4 of 6 planned q. three-week doses of doxorubicin, docetaxel and cyclophosphamide.  She receives Neulasta on day 2 for granulocyte support.  Kaydon's biggest complaint today is some increased swelling in the lower extremities. This is bilateral, and has been equal bilaterally. Her legs feel tight. The swelling does increase during the day and decreases at night with elevation.   she continues to have some shortness of breath with exertion, but this is stable, and has definitely not worsened.   REVIEW OF SYSTEMS: Jermaine  denies any recent fevers, chills, or night sweats. She's had some mild hyperpigmentation of the skin and nailbeds, but no additional rashes or skin changes noted. No abnormal bruising or bleeding. She's had no mouth ulcers or oral sensitivity. She's eating well with no nausea, emesis, or change in bowel or bladder habits. She is having normal bowel movements, with no diarrhea. She denies any increased cough or phlegm production. As noted above, shortness of breath is primarily with exertion and is stable. She's had no orthopnea and denies any chest pain or palpitations. She denies any abnormal headaches, dizziness, or change in vision. She currently denies any unusual myalgias, arthralgias, or bony pain. She's had no signs of peripheral neuropathy in either the upper or lower  extremities.  A detailed review systems is otherwise stable and  noncontributory.   PAST MEDICAL HISTORY: Past Medical History  Diagnosis Date  . Anxiety   . Depression     history of depression after son died  . Breast cancer of upper-outer quadrant of right female breast 03/10/2013  . Hypertension     PAST SURGICAL HISTORY: Past Surgical History  Procedure Laterality Date  . Abdominal hysterectomy    . Multiple tooth extractions Bilateral   . Tonsillectomy    . Cesarean section    . Portacath placement Right 04/15/2013    Procedure: INSERTION PORT-A-CATH;  Surgeon: Adin Hector, MD;  Location: [facility];  Service: General;  Laterality: Right;  . Port a cath revision Right 04/29/2013    Procedure: PORT A CATH REVISION;  Surgeon: Adin Hector, MD;  Location: [facility];  Service: General;  Laterality: Right;    FAMILY HISTORY Family History  Problem Relation Age of Onset  . Cancer Mother   . Hypertension Mother   . Bowel Disease Mother   . Lung disease Father   . Heart disease Maternal Grandmother   . Heart disease Maternal Grandfather    the patient's mother,Flowrene Millings, had a lung mass but apparently died from unrelated causes (she was followed by Dr. Earlie Server here). She was 64 years old. The patient has little information about her father. The patient had 2 brothers, no sisters. There is no history of breast or ovarian cancer in the family to her knowledge.  GYNECOLOGIC HISTORY:  Menarche age 68, first live birth age 24, the patient is GX P1. She went through the change of life approximately 1999. She did not take hormone replacement.  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 She is on disability throughout her treatment. She is single, lives alone with her [place]. The patient's son Helen Hashimoto died at the age of 52 with acute leukemia. The patient has no grandchildren.    ADVANCED DIRECTIVES: Not in  place, but the patient was given the appropriate documents to complete and notarize on her 03/16/2013 visit. She intends to name her cousin, Sharrie Rothman, as her healthcare power of attorney. She can be reached at [place]:  (Updated 07/12/2013) History  Substance Use Topics  . Smoking status: Former Smoker -- 1.00 packs/day    Types: Cigarettes    Quit date: 04/12/2001  . Smokeless tobacco: Never Used  . Alcohol Use: Yes     Comment: occasional wine     Colonoscopy: Not on file  PAP: UTD/Dr. Delsa Sale  Bone density:  Never  Lipid panel:  Dec 2014/Dr. Jackson-Moore   Allergies  Allergen Reactions  . Ciprofloxacin     Patient developed severe diarrhea on cipro    Current Outpatient Prescriptions  Medication Sig Dispense Refill  . amLODipine-olmesartan (AZOR) 10-40 MG per tablet Take 1 tablet by mouth every morning.      Marland Kitchen dexamethasone (DECADRON) 4 MG tablet 2 tabs by mouth twice daily with food on day before and 3 days after each chemo dose  30 tablet  2  . hydrocortisone 2.5 % cream Apply 1 application topically 2 (two) times daily.      Marland Kitchen lidocaine-prilocaine (EMLA) cream Apply 1 application topically daily as needed. Pain port access 1-2 hours before chemo      . Loratadine (CLARITIN PO) Take 1 tablet by mouth.      Marland Kitchen  Multiple Vitamin (MULTIVITAMIN) capsule Take 1 capsule by mouth daily.      . naproxen sodium (ANAPROX) 220 MG tablet Take 220 mg by mouth 2 (two) times daily as needed.      . ciprofloxacin (CIPRO) 500 MG tablet       . HYDROcodone-acetaminophen (NORCO/VICODIN) 5-325 MG per tablet Take 1-2 tablets by mouth every 6 (six) hours as needed for moderate pain.      Marland Kitchen LORazepam (ATIVAN) 0.5 MG tablet Take 0.5 mg by mouth. Take 1 -2 tabs at night as needed for sleep      . omeprazole (PRILOSEC) 20 MG capsule Take 1 capsule (20 mg total) by mouth daily.  30 capsule  2  . ondansetron (ZOFRAN) 8 MG tablet Take 8 mg by mouth 2 (two) times daily  as needed for nausea or vomiting.      . Potassium Chloride in NaCl (0.9 % NACL WITH KCL 20 MEQ / L) 20-0.9 MEQ/L-% Please bring patient in for 20 meq potassium chloride in 500 ml 0.9% Normal saline.  Have patient return to [facility] after completion of IVF.  500 mL  ml  . prochlorperazine (COMPAZINE) 10 MG tablet Take 10 mg by mouth every 6 (six) hours as needed for nausea or vomiting. For 3 days after chemo       No current facility-administered medications for this visit.   Facility-Administered Medications Ordered in Other Visits  Medication Dose Route Frequency Provider Last Rate Last Dose  . cyclophosphamide (CYTOXAN) 1,020 mg in sodium chloride 0.9 % 250 mL chemo infusion  500 mg/m2 (Treatment Plan Actual) Intravenous Once Boots Mcglown G Jayde Mcallister, PA-C      . DOCEtaxel (TAXOTERE) 150 mg in sodium chloride 0.9 % 250 mL chemo infusion  75 mg/m2 (Treatment Plan Actual) Intravenous Once Reynol Arnone G Temitope Griffing, PA-C      . DOXOrubicin (ADRIAMYCIN) chemo injection 102 mg  50 mg/m2 (Treatment Plan Actual) Intravenous Once Khaliq Turay G Vaida Kerchner, PA-C      . fosaprepitant (EMEND) 150 mg in sodium chloride 0.9 % 145 mL IVPB  150 mg Intravenous Once Theotis Burrow, PA-C 300 mL/hr at 07/12/13 1123 150 mg at 07/12/13 1123  . heparin lock flush 100 unit/mL  500 Units Intracatheter Once PRN Lyrika Souders G Dewie Ahart, PA-C      . sodium chloride 0.9 % injection 10 mL  10 mL Intracatheter PRN Eowyn Tabone Milda Smart, PA-C        OBJECTIVE: Middle-aged [place] American woman who appears  comfortable and is in no acute distress  Filed Vitals:   07/12/13 1015  BP: 146/73  Pulse: 96  Temp: 98.2 F (36.8 C)  Resp: 18     Body mass index is 41.59 kg/(m^2).    ECOG FS: 1 Filed Weights   07/12/13 1015  Weight: 220 lb (99.791 kg)   Physical Exam: HEENT:  Sclerae anicteric.  Oropharynx clear and moist.  There are some dark and, but no ulcerations and no evidence of candidiasis. Neck supple, trachea midline.   NODES:  No cervical or supraclavicular lymphadenopathy  palpated.  BREAST EXAM:   Deferred. Axillae are benign bilaterally palpable lymphadenopathy.  LUNGS:  Clear to auscultation bilaterally.  No wheezes or rhonchi HEART:  Regular rate and rhythm. No murmur appreciated.  ABDOMEN:  Soft, nontender.  Positive bowel sounds.  MSK:  No focal spinal tenderness to palpation.   Good range of motion bilaterally in the upper extremities. EXTREMITIES:   1+ pitting edema bilaterally in the lower extremities. Equal  bilaterally. No erythema or palpable cords noted. No swelling noted in the upper extremities.   SKIN:  Benign with the exception of hyperpigmentation on the hands and nailbeds bilaterally. No additional rashes are noted. No excessive ecchymoses and no petechiae. Port is intact in the right upper chest wall with no erythema, edema, or evidence of infection/cellulitis. NEURO:  Nonfocal. Well oriented.  appropriate affect.    LAB RESULTS:   Lab Results  Component Value Date   WBC 20.8* 07/12/2013   NEUTROABS 19.4* 07/12/2013   HGB 11.6 07/12/2013   HCT 36.6 07/12/2013   MCV 86.2 07/12/2013   PLT 439* 07/12/2013      Chemistry      Component Value Date/Time   NA 145 07/12/2013 0927   NA 141 04/04/2013 1031   K 4.3 07/12/2013 0927   K 3.6* 04/04/2013 1031   CL 103 04/04/2013 1031   CO2 25 07/12/2013 0927   CO2 27 04/04/2013 1031   BUN 13.2 07/12/2013 0927   BUN 16 04/04/2013 1031   CREATININE 0.8 07/12/2013 0927   CREATININE 0.81 04/04/2013 1031   CREATININE 0.75 03/02/2013 1135      Component Value Date/Time   CALCIUM 10.3 07/12/2013 0927   CALCIUM 9.3 04/04/2013 1031   ALKPHOS 89 07/12/2013 0927   ALKPHOS 74 04/04/2013 1031   AST 16 07/12/2013 0927   AST 16 04/04/2013 1031   ALT 36 07/12/2013 0927   ALT 23 04/04/2013 1031   BILITOT <0.20 07/12/2013 0927   BILITOT 0.3 04/04/2013 1031       STUDIES:  Echocardiogram on 03/29/2013 showed an ejection fraction of 55-60%.  Mr Breast Bilateral W Wo Contrast 07/11/2013   CLINICAL DATA:  63 year old female  recently diagnosed with grade 3 invasive mammary carcinoma in the right breast. The patient is receiving neoadjuvant chemotherapy. A biopsied right axillary lymph node was negative.  LABS:  None.  EXAM: BILATERAL BREAST MRI WITH AND WITHOUT CONTRAST  TECHNIQUE: Multiplanar, multisequence MR images of both breasts were obtained prior to and following the intravenous administration of 77m of MultiHance.  THREE-DIMENSIONAL MR IMAGE RENDERING ON INDEPENDENT WORKSTATION:  Three-dimensional MR images were rendered by post-processing of the original MR data on an independent workstation. The three-dimensional MR images were interpreted, and findings are reported in the following complete MRI report for this study. Three dimensional images were evaluated at the independent DynaCad workstation  COMPARISON:  Mammograms dated 03/04/2013 and 03/03/2013. Prior MRI stated 03/11/2013.  FINDINGS: Breast composition: c:  Heterogeneous fibroglandular tissue  Background parenchymal enhancement: Moderate  Right breast: Irregular enhancing mass in the central and upper outer quadrant of the right breast extending to and retracting the nipple measures 2.8 cm AP, 4.8 cm transverse and 3.1 cm cranial caudal. On the prior MRI dated 03/11/2013 the mass measured 4.1 cm AP, 6.8 cm transverse and 4.9 cm cranial caudal.  Left breast: No mass or abnormal enhancement.  Lymph nodes: No abnormal appearing lymph nodes.  Ancillary findings:  A right sided Port-A-Cath has been placed.  IMPRESSION: Interval reduction in the known invasive mammary carcinoma in the right breast, as described above.  RECOMMENDATION: Continue treatment planning is recommended.  BI-RADS CATEGORY  6: Known biopsy-proven malignancy - appropriate action should be taken.   Electronically Signed   By: DLillia MountainM.D.   On: 07/11/2013 09:27     ASSESSMENT: 63y.o. [place] woman   (1)  status post right breast biopsy 03/04/2013 for a clinical T3 N1, stage IIIA invasive  ductal carcinoma, grade 3, estrogen receptor 100% positive, progesterone receptor 100% positive, with an MIB-1 of 20% and no HER-2 amplification.  (2) right axillary lymph node biopsy 03/04/2013 was benign   (3) status post Port-A-Cath remission on 04/29/2013  (4) Neoadjuvant chemotherapy with docetaxel doxorubicin and cyclophosphamide - start date  on 05/03/2013 to be repeated once every 3 weeks for 6 cycles.   (5) Neulasta on day 2 of each cycle for granulocyte support  (6)  patient will likely need a mastectomy following neoadjuvant chemotherapy.  (7) GERD -  controlled with omeprazole  (8) history of right shoulder dislocation with intermittent right shoulder pain - controlled with Aleve  (9)  swelling in the lower extremities (Doppler study of the right lower extremity was negative for a DVT on 06/02/2013.)    PLAN: Aishah will proceed to treatment today as scheduled for her fourth neoadjuvant cycle of docetaxel/doxorubicin/cyclophosphamide, and will return tomorrow for her Neulasta injection as scheduled. We're making no changes in her current regimen. Recent breast MRI as detailed above shows a positive response to therapy thus far. We did review these MRI results together today.  Hoyle Sauer and I discussed the possibility of a Doppler which she currently declines, but she understands to call us if the swelling worsens in her lower extremities, is worse in one leg and the other, or if her lower extremity is become reddened or more painful. Otherwise, I encouraged her to drink plenty of water, decreased the sodium and her daily diet, walk on a daily basis, and when sedentary, keep her legs elevated as much as possible.   I will see Gagandeep again next week for assessment of chemotoxicity on April 21. She is also scheduled to see her surgeon, Dr. Dalbert Batman, next week on April 23.    Theotis Burrow, PA-C  Medical oncology   07/12/2013 11:32 AM

## 2013-07-13 ENCOUNTER — Ambulatory Visit (HOSPITAL_BASED_OUTPATIENT_CLINIC_OR_DEPARTMENT_OTHER): Payer: 59

## 2013-07-13 VITALS — BP 148/66 | HR 88 | Temp 97.8°F

## 2013-07-13 DIAGNOSIS — C50419 Malignant neoplasm of upper-outer quadrant of unspecified female breast: Secondary | ICD-10-CM

## 2013-07-13 DIAGNOSIS — Z5189 Encounter for other specified aftercare: Secondary | ICD-10-CM

## 2013-07-13 DIAGNOSIS — C50411 Malignant neoplasm of upper-outer quadrant of right female breast: Secondary | ICD-10-CM

## 2013-07-13 MED ORDER — PEGFILGRASTIM INJECTION 6 MG/0.6ML
6.0000 mg | Freq: Once | SUBCUTANEOUS | Status: AC
  Administered 2013-07-13: 6 mg via SUBCUTANEOUS
  Filled 2013-07-13: qty 0.6

## 2013-07-19 ENCOUNTER — Ambulatory Visit (HOSPITAL_BASED_OUTPATIENT_CLINIC_OR_DEPARTMENT_OTHER): Payer: 59 | Admitting: Physician Assistant

## 2013-07-19 ENCOUNTER — Telehealth: Payer: Self-pay | Admitting: Oncology

## 2013-07-19 ENCOUNTER — Encounter: Payer: Self-pay | Admitting: Physician Assistant

## 2013-07-19 ENCOUNTER — Other Ambulatory Visit (HOSPITAL_BASED_OUTPATIENT_CLINIC_OR_DEPARTMENT_OTHER): Payer: 59

## 2013-07-19 ENCOUNTER — Encounter (INDEPENDENT_AMBULATORY_CARE_PROVIDER_SITE_OTHER): Payer: 59 | Admitting: General Surgery

## 2013-07-19 VITALS — BP 123/87 | HR 96 | Temp 98.7°F | Resp 18 | Ht 61.0 in | Wt 212.2 lb

## 2013-07-19 DIAGNOSIS — C50411 Malignant neoplasm of upper-outer quadrant of right female breast: Secondary | ICD-10-CM

## 2013-07-19 DIAGNOSIS — C50419 Malignant neoplasm of upper-outer quadrant of unspecified female breast: Secondary | ICD-10-CM

## 2013-07-19 DIAGNOSIS — R600 Localized edema: Secondary | ICD-10-CM

## 2013-07-19 DIAGNOSIS — Z170 Estrogen receptor positive status [ER+]: Secondary | ICD-10-CM

## 2013-07-19 DIAGNOSIS — D702 Other drug-induced agranulocytosis: Secondary | ICD-10-CM

## 2013-07-19 DIAGNOSIS — D701 Agranulocytosis secondary to cancer chemotherapy: Secondary | ICD-10-CM

## 2013-07-19 DIAGNOSIS — T451X5A Adverse effect of antineoplastic and immunosuppressive drugs, initial encounter: Secondary | ICD-10-CM

## 2013-07-19 LAB — CBC WITH DIFFERENTIAL/PLATELET
BASO%: 0.8 % (ref 0.0–2.0)
Basophils Absolute: 0 10*3/uL (ref 0.0–0.1)
EOS ABS: 0 10*3/uL (ref 0.0–0.5)
EOS%: 1.3 % (ref 0.0–7.0)
HCT: 35.3 % (ref 34.8–46.6)
HGB: 11.7 g/dL (ref 11.6–15.9)
LYMPH%: 38.8 % (ref 14.0–49.7)
MCH: 27.9 pg (ref 25.1–34.0)
MCHC: 33.1 g/dL (ref 31.5–36.0)
MCV: 84 fL (ref 79.5–101.0)
MONO#: 0.7 10*3/uL (ref 0.1–0.9)
MONO%: 29.1 % — ABNORMAL HIGH (ref 0.0–14.0)
NEUT#: 0.7 10*3/uL — ABNORMAL LOW (ref 1.5–6.5)
NEUT%: 30 % — ABNORMAL LOW (ref 38.4–76.8)
NRBC: 0 % (ref 0–0)
PLATELETS: 156 10*3/uL (ref 145–400)
RBC: 4.2 10*6/uL (ref 3.70–5.45)
RDW: 18 % — ABNORMAL HIGH (ref 11.2–14.5)
WBC: 2.4 10*3/uL — AB (ref 3.9–10.3)
lymph#: 0.9 10*3/uL (ref 0.9–3.3)

## 2013-07-19 MED ORDER — CEPHALEXIN 500 MG PO CAPS: 500.0000 mg | ORAL_CAPSULE | Freq: Two times a day (BID) | ORAL | Status: DC

## 2013-07-19 NOTE — Progress Notes (Signed)
ID: [name] OB: 06-23-1950  MR#: [id number]  CSN#:[id number]  PCP: Ron Parker, MD GYN:  Lahoma Crocker SU: Fanny Skates OTHER MD: Thea Silversmith   CURRENT THERAPY: Neodjuvant chemotherapy DOCETAXEL/ADRIAMYCIN/CYTOXAN (TAC)  CHIEF COMPLAINT: Right breast cancer/neoadjuvant chemotherapy  HISTORY OF PRESENT ILLNESS: As per the previously documented note: Denise Becker palpated a change in her right breast sometime in August or September 2014. She was aware that this might be significant, but waited on meds until she saw her gynecologist for a routine visit. Dr. Delsa Sale immediately he set her up for right mammography and ultrasonography performed 03/03/2013. Mammography found an area of density in the upper outer quadrant of the right breast measuring 5 cm in the with right nipple retraction. There was no evidence of skin thickening. On exam there was a firm palpable mass in the superior right breast extending from 1:00 to 9:00. The right nipple was completely retracted. There was no skin thickening. The right axilla was benign by palpation. Ultrasound showed a prominent irregular hypoechoic mass larger than the ultrasound screen. Ultrasound of the right axilla showed 2 adjacent small lymph nodes with diffusely thickened cortices, the largest measuring 1.1 cm.  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 There was no HER-2 amplification with a ratio of 1.46 by CISH and an average copy number of 2.85.  On 03/11/2013 the patient underwent bilateral breast MRI. This showed an irregular enhancing mass in the central to upper right breast with nipple retraction measuring in total 6.8  cm. There were several adjacent enhancing nodules as well. There was no involvement of the pectoralis muscle. There were no definite morphologically abnormal right axillary lymph nodes there was no definite internal mammary or lymphadenopathy. Left breast was unremarkable.  The patient's subsequent history is as detailed below   INTERVAL HISTORY: Denise Becker returns alone today for followup of her locally advanced right breast cancer. She continues with her neoadjuvant therapy, and is currently day 8 cycle 4 of 6 planned q. three-week doses of doxorubicin, docetaxel and cyclophosphamide.  She receives Neulasta on day 2 for granulocyte support.  Denise Becker continues to tolerate treatment reasonably well. Fortunately, she's had no problems with peripheral neuropathy. Her biggest complaint continues to be weakness, fatigue, and swelling in the lower extremities. She is trying to exercise, and I encouraged her to do so on a daily basis. She is sleeping fairly well. The swelling is equal bilaterally and does increase somewhat with elevation. She is trying to keep herself well hydrated, and I reminded her to monitor her sodium intake.  REVIEW OF SYSTEMS: Denise Becker currently denies any fevers, chills, or night sweats. She's had some mild hyperpigmentation of the skin and nailbeds, but denies any additional skin changes. She has noticed no abnormal bruising or bleeding. She's had no mouth ulcers or oral sensitivity, and her appetite is good. She denies any problems with nausea, emesis, or change in bowel or bladder habits. She's had no cough, phlegm production, increased shortness of breath, orthopnea, chest pain, or palpitations. She denies any abnormal headaches or dizziness. She's had no change in vision. She currently denies any unusual myalgias, arthralgias, or bony pain.  A detailed review of systems is otherwise stable and noncontributory.    PAST MEDICAL  HISTORY: Past Medical History  Diagnosis Date  .  Anxiety   . Depression     history of depression after son died  . Breast cancer of upper-outer quadrant of right female breast 03/10/2013  . Hypertension     PAST SURGICAL HISTORY: Past Surgical History  Procedure Laterality Date  . Abdominal hysterectomy    . Multiple tooth extractions Bilateral   . Tonsillectomy    . Cesarean section    . Portacath placement Right 04/15/2013    Procedure: INSERTION PORT-A-CATH;  Surgeon: Adin Hector, MD;  Location: [facility];  Service: General;  Laterality: Right;  . Port a cath revision Right 04/29/2013    Procedure: PORT A CATH REVISION;  Surgeon: Adin Hector, MD;  Location: [facility];  Service: General;  Laterality: Right;    FAMILY HISTORY Family History  Problem Relation Age of Onset  . Cancer Mother   . Hypertension Mother   . Bowel Disease Mother   . Lung disease Father   . Heart disease Maternal Grandmother   . Heart disease Maternal Grandfather    the patient's mother,Denise Becker, had a lung mass but apparently died from unrelated causes (she was followed by Dr. Earlie Server here). She was 64 years old. The patient has little information about her father. The patient had 2 brothers, no sisters. There is no history of breast or ovarian cancer in the family to her knowledge.  GYNECOLOGIC HISTORY:  Menarche age 41, first live birth age 69, the patient is GX P1. She went through the change of life approximately 1999. She did not take hormone replacement.  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 She is on disability throughout her treatment. She is single, lives alone with her Denise Becker. The patient's son Denise Becker died at the age of 70 with acute leukemia. The patient has no grandchildren.    ADVANCED DIRECTIVES: Not in place, but the patient was given the appropriate documents to complete and notarize on her  03/16/2013 visit. She intends to name her cousin, Denise Becker, as her healthcare power of attorney. She can be reached at [place]:  (Updated 07/12/2013) History  Substance Use Topics  . Smoking status: Former Smoker -- 1.00 packs/day    Types: Cigarettes    Quit date: 04/12/2001  . Smokeless tobacco: Never Used  . Alcohol Use: Yes     Comment: occasional wine     Colonoscopy: Not on file  PAP: UTD/Dr. Delsa Sale  Bone density:  Never  Lipid panel:  Dec 2014/Dr. Jackson-Moore   Allergies  Allergen Reactions  . Ciprofloxacin     Patient developed severe diarrhea on cipro    Current Outpatient Prescriptions  Medication Sig Dispense Refill  . amLODipine-olmesartan (AZOR) 10-40 MG per tablet Take 1 tablet by mouth every morning.      . hydrocortisone 2.5 % cream Apply 1 application topically 2 (two) times daily.      Marland Kitchen LORazepam (ATIVAN) 0.5 MG tablet Take 0.5 mg by mouth. Take 1 -2 tabs at night as needed for sleep      . Multiple Vitamin (MULTIVITAMIN) capsule Take 1 capsule by mouth daily.      . naproxen sodium (ANAPROX) 220 MG tablet Take 220 mg by mouth 2 (two) times daily as needed.      Marland Kitchen omeprazole (PRILOSEC) 20 MG capsule Take 1 capsule (20 mg total) by  mouth daily.  30 capsule  2  . cephALEXin (KEFLEX) 500 MG capsule Take 1 capsule (500 mg total) by mouth 2 (two) times daily.  14 capsule  2  . dexamethasone (DECADRON) 4 MG tablet 2 tabs by mouth twice daily with food on day before and 3 days after each chemo dose  30 tablet  2  . HYDROcodone-acetaminophen (NORCO/VICODIN) 5-325 MG per tablet Take 1-2 tablets by mouth every 6 (six) hours as needed for moderate pain.      Marland Kitchen lidocaine-prilocaine (EMLA) cream Apply 1 application topically daily as needed. Pain port access 1-2 hours before chemo      . Loratadine (CLARITIN PO) Take 1 tablet by mouth.      . ondansetron (ZOFRAN) 8 MG tablet Take 8 mg by mouth 2 (two) times daily as needed for nausea or  vomiting.      . Potassium Chloride in NaCl (0.9 % NACL WITH KCL 20 MEQ / L) 20-0.9 MEQ/L-% Please bring patient in for 20 meq potassium chloride in 500 ml 0.9% Normal saline.  Have patient return to [facility] after completion of IVF.  500 mL  ml  . prochlorperazine (COMPAZINE) 10 MG tablet Take 10 mg by mouth every 6 (six) hours as needed for nausea or vomiting. For 3 days after chemo       No current facility-administered medications for this visit.    OBJECTIVE: Middle-aged [place] American woman who appears tired but is in no acute distress  Filed Vitals:   07/19/13 1029  BP: 123/87  Pulse: 96  Temp: 98.7 F (37.1 C)  Resp: 18     Body mass index is 40.12 kg/(m^2).    ECOG FS: 1 Filed Weights   07/19/13 1029  Weight: 212 lb 3.2 oz (96.253 kg)   Physical Exam: HEENT:  Sclerae anicteric.  Oropharynx clear, pink and moist.  No ulcerations and no evidence of oropharyngeal candidiasis. Neck supple, trachea midline.   NODES:  No cervical or supraclavicular lymphadenopathy palpated.  BREAST EXAM:   Deferred. Axillae are benign bilaterally palpable lymphadenopathy.  LUNGS:  Clear to auscultation bilaterally with good excursion.  No wheezes or rhonchi HEART:  Regular rate and rhythm. No murmur appreciated.  ABDOMEN:  Soft, obese, nontender. No hepatomegaly or masses.  Positive bowel sounds.  MSK:  No focal spinal tenderness to palpation.   Good range of motion bilaterally in the upper extremities. EXTREMITIES:   1+ pitting edema bilaterally in the lower extremities. Equal bilaterally. No erythema or palpable cords noted. No swelling noted in the upper extremities.   SKIN: Hyperpigmentation noted bilaterally in the hands and nailbeds. No drainage or evidence of infection. No additional rashes are noted. No excessive ecchymoses. No petechiae. Port is intact in the right upper chest wall with no erythema, edema, or evidence of infection/cellulitis. NEURO:  Nonfocal. Well oriented.  Appropriate  affect.    LAB RESULTS:   Lab Results  Component Value Date   WBC 2.4* 07/19/2013   NEUTROABS 0.7* 07/19/2013   HGB 11.7 07/19/2013   HCT 35.3 07/19/2013   MCV 84.0 07/19/2013   PLT 156 07/19/2013      Chemistry      Component Value Date/Time   NA 145 07/12/2013 0927   NA 141 04/04/2013 1031   K 4.3 07/12/2013 0927   K 3.6* 04/04/2013 1031   CL 103 04/04/2013 1031   CO2 25 07/12/2013 0927   CO2 27 04/04/2013 1031   BUN 13.2 07/12/2013 0927  BUN 16 04/04/2013 1031   CREATININE 0.8 07/12/2013 0927   CREATININE 0.81 04/04/2013 1031   CREATININE 0.75 03/02/2013 1135      Component Value Date/Time   CALCIUM 10.3 07/12/2013 0927   CALCIUM 9.3 04/04/2013 1031   ALKPHOS 89 07/12/2013 0927   ALKPHOS 74 04/04/2013 1031   AST 16 07/12/2013 0927   AST 16 04/04/2013 1031   ALT 36 07/12/2013 0927   ALT 23 04/04/2013 1031   BILITOT <0.20 07/12/2013 0927   BILITOT 0.3 04/04/2013 1031       STUDIES:  Echocardiogram on 03/29/2013 showed an ejection fraction of 55-60%.  Mr Breast Bilateral W Wo Contrast 07/11/2013   CLINICAL DATA:  63 year old female recently diagnosed with grade 3 invasive mammary carcinoma in the right breast. The patient is receiving neoadjuvant chemotherapy. A biopsied right axillary lymph node was negative.  LABS:  None.  EXAM: BILATERAL BREAST MRI WITH AND WITHOUT CONTRAST  TECHNIQUE: Multiplanar, multisequence MR images of both breasts were obtained prior to and following the intravenous administration of 57m of MultiHance.  THREE-DIMENSIONAL MR IMAGE RENDERING ON INDEPENDENT WORKSTATION:  Three-dimensional MR images were rendered by post-processing of the original MR data on an independent workstation. The three-dimensional MR images were interpreted, and findings are reported in the following complete MRI report for this study. Three dimensional images were evaluated at the independent DynaCad workstation  COMPARISON:  Mammograms dated 03/04/2013 and 03/03/2013. Prior MRI stated 03/11/2013.   FINDINGS: Breast composition: c:  Heterogeneous fibroglandular tissue  Background parenchymal enhancement: Moderate  Right breast: Irregular enhancing mass in the central and upper outer quadrant of the right breast extending to and retracting the nipple measures 2.8 cm AP, 4.8 cm transverse and 3.1 cm cranial caudal. On the prior MRI dated 03/11/2013 the mass measured 4.1 cm AP, 6.8 cm transverse and 4.9 cm cranial caudal.  Left breast: No mass or abnormal enhancement.  Lymph nodes: No abnormal appearing lymph nodes.  Ancillary findings:  A right sided Port-A-Cath has been placed.  IMPRESSION: Interval reduction in the known invasive mammary carcinoma in the right breast, as described above.  RECOMMENDATION: Continue treatment planning is recommended.  BI-RADS CATEGORY  6: Known biopsy-proven malignancy - appropriate action should be taken.   Electronically Signed   By: DLillia MountainM.D.   On: 07/11/2013 09:27     ASSESSMENT: 63y.o. [place] woman   (1)  status post right breast biopsy 03/04/2013 for a clinical T3 N1, stage IIIA invasive ductal carcinoma, grade 3, estrogen receptor 100% positive, progesterone receptor 100% positive, with an MIB-1 of 20% and no HER-2 amplification.  (2) right axillary lymph node biopsy 03/04/2013 was benign   (3) status post Port-A-Cath remission on 04/29/2013  (4) Neoadjuvant chemotherapy with docetaxel doxorubicin and cyclophosphamide - start date  on 05/03/2013 to be repeated once every 3 weeks for 6 cycles.   (5) Neulasta on day 2 of each cycle for granulocyte support  (6)  patient will likely need a mastectomy following neoadjuvant chemotherapy.  (7) GERD -  controlled with omeprazole  (8) history of right shoulder dislocation with intermittent right shoulder pain - controlled with Aleve  (9)  swelling in the lower extremities (Doppler study of the right lower extremity was negative for a DVT on 06/02/2013.)  (10)  chemotherapy-induced neutropenia,  afebrile   PLAN: We again reviewed neutropenic percussion. CIzeladid not tolerate Cipro a few weeks ago apparently developed some diarrhea rather quickly. Accordingly, she is being treated prophylactically with  Keflex, 500 mg by mouth twice a day for the next 7 days. She does know to call us with any fevers of 100 or above, and she was given all of this information in writing today.   Otherwise, I am making no additional changes to Denise Becker's regimen. She will see Dr. Dalbert Batman later this week for reevaluation and assessment of response. I will then see her next Friday, may first, for repeat labs and physical exam prior to her fifth dose of neoadjuvant chemotherapy on May 5. Our plan is to complete a total of 6 cycles, then repeat a breast MRI in early June prior to proceeding to definitive surgery.   All of the above is reviewed in detail with the patient today. She voices her understanding and agreement with this plan. She knows to call with any changes or problems.  Theotis Burrow, PA-C  Medical oncology   07/19/2013 5:50 PM

## 2013-07-19 NOTE — Telephone Encounter (Signed)
pt req copy of sch °

## 2013-07-20 ENCOUNTER — Telehealth: Payer: Self-pay | Admitting: Oncology

## 2013-07-20 NOTE — Telephone Encounter (Signed)
, °

## 2013-07-21 ENCOUNTER — Ambulatory Visit: Payer: 59

## 2013-07-21 ENCOUNTER — Encounter (INDEPENDENT_AMBULATORY_CARE_PROVIDER_SITE_OTHER): Payer: Self-pay | Admitting: General Surgery

## 2013-07-21 ENCOUNTER — Telehealth: Payer: Self-pay | Admitting: *Deleted

## 2013-07-21 ENCOUNTER — Ambulatory Visit (INDEPENDENT_AMBULATORY_CARE_PROVIDER_SITE_OTHER): Payer: 59 | Admitting: General Surgery

## 2013-07-21 VITALS — BP 132/72 | HR 80 | Temp 97.6°F | Resp 14 | Ht 61.0 in | Wt 211.6 lb

## 2013-07-21 DIAGNOSIS — C50419 Malignant neoplasm of upper-outer quadrant of unspecified female breast: Secondary | ICD-10-CM

## 2013-07-21 DIAGNOSIS — C50411 Malignant neoplasm of upper-outer quadrant of right female breast: Secondary | ICD-10-CM

## 2013-07-21 NOTE — Progress Notes (Addendum)
Patient ID: Denise Becker, female   DOB: Feb 21, 1951, 63 y.o.   MRN: [id number]  History:  This patient was diagnosed with locally advanced cancer of the right breast, central location. She presented  In Dec., 2014 with obvious nipple retraction, a tumor at least 7 cm transversely and at least 5 cm vertically. There was no palpable adenopathy. Clinical stage was T3, N0, receptor positive, HER-2/neu negative.  A Port-A-Cath was inserted and functioned normally in the perioperative period but then it kinked and she had to be returned to the operating room to reposition the port and take the kink out of the catheter and it has since worked fine. She has received her first cycle of chemotherapy.  She has no complaints about the port  She states that she has finished 4 of 6 cycles of chemotherapy.  Mid treatment MRI was performed on 06/27/2013. It shows that the transverse dimensions of the tumor have gone down from 6.8 cm to 4.8 cm. There are no abnormal lymph nodes. There is no mass or enhancement in the left breast. She thinks the tumor smaller states that she really does not examine her breasts. .  Exam:  Patient looks good. She seems comfortable and relieved and everything seems to be going well.  Neck reveals no adenopathy or mass. Lungs are clear to auscultation.  Port slight right infraclavicular area looks good. No hematoma or infection. Minimal tenderness  Right breast reveals soft skin. The port traction. Palpable mass in the central breast at least 4 cm in diameter. No axillary mass. Heart regular rate and rhythm. No murmur. No ectopy   Assessment:  Invasive mammary carcinoma right breast, probable ductal phenotype, clinical stage TIII N0, receptor positive, HER-2-negative, locally advanced With skin and nipple involvement Status post Port-A-Cath insertion and subsequent revision  Status post 4 of 6 cycles of neoadjuvant chemotherapy  Status post TAH and BSO  Hypertension  Anxiety  and depression  Addendum:( 06/01/201). CT scan  5- 29-15 shows large ventral incisional hernia. This contained small bowel and colon. Nonobstructed.    Plan:  Continue chemotherapy as outlined  End of neoadjuvant treatment MRI in June See me in June for final surgical planning..  Surgical plan will ultimately be a right total mastectomy and sentinel node  biopsy and possible axillary lymph node dissection. However.Marland KitchenMarland KitchenCurrent consensus at conference was that she would not benefit her to get axillary lymph node dissection. We will finalize this decision prior to her definitive surgery. Although I have discussed  surgical plan with her, as Dr. Jana Hakim has. She seemed disappointed and surprised she could not have a lumpectomy, even though we have discussed this before. I went over the rationale for this with her again. She seems to understand.      Edsel Petrin. Dalbert Batman, M.D., [facility] General and Minimally invasive Surgery Breast and Colorectal Surgery Office:   [phone] Pager:   [phone]

## 2013-07-21 NOTE — Patient Instructions (Signed)
The tumor in your right breast is responding to chemotherapy. The skin is softer. I can still feel the cancer in the breast,  but it is smaller. I do not feel any cancer under your arm.  You should complete your chemotherapy by the end of May  You should get an end-of- treatment MRI sometime in June  Return to see Dr. Dalbert Batman in the latter half of June, after the MRI is done. We will discuss surgical options and surgical planning at that time.

## 2013-07-21 NOTE — Telephone Encounter (Signed)
Called and scheduled Breast MRI at [facility], patient preference. Appt for 08/29/2013 @ 0845, notified patient.

## 2013-07-22 ENCOUNTER — Ambulatory Visit: Payer: 59

## 2013-07-25 ENCOUNTER — Encounter: Payer: Self-pay | Admitting: Oncology

## 2013-07-25 NOTE — Progress Notes (Signed)
Per [employer] the patient doesn't qual for asst for Aloxi---insurance and over income limit.

## 2013-07-27 ENCOUNTER — Ambulatory Visit: Payer: 59

## 2013-07-29 ENCOUNTER — Telehealth: Payer: Self-pay | Admitting: *Deleted

## 2013-07-29 ENCOUNTER — Other Ambulatory Visit: Payer: Self-pay | Admitting: Physician Assistant

## 2013-07-29 ENCOUNTER — Telehealth: Payer: Self-pay | Admitting: Physician Assistant

## 2013-07-29 ENCOUNTER — Ambulatory Visit (HOSPITAL_BASED_OUTPATIENT_CLINIC_OR_DEPARTMENT_OTHER): Payer: 59 | Admitting: Physician Assistant

## 2013-07-29 ENCOUNTER — Encounter: Payer: Self-pay | Admitting: Physician Assistant

## 2013-07-29 ENCOUNTER — Other Ambulatory Visit (HOSPITAL_BASED_OUTPATIENT_CLINIC_OR_DEPARTMENT_OTHER): Payer: 59

## 2013-07-29 VITALS — BP 127/83 | HR 86 | Temp 98.2°F | Resp 18 | Ht 61.0 in | Wt 220.0 lb

## 2013-07-29 DIAGNOSIS — C50411 Malignant neoplasm of upper-outer quadrant of right female breast: Secondary | ICD-10-CM

## 2013-07-29 DIAGNOSIS — R600 Localized edema: Secondary | ICD-10-CM

## 2013-07-29 DIAGNOSIS — C50419 Malignant neoplasm of upper-outer quadrant of unspecified female breast: Secondary | ICD-10-CM

## 2013-07-29 DIAGNOSIS — D649 Anemia, unspecified: Secondary | ICD-10-CM

## 2013-07-29 DIAGNOSIS — H04203 Unspecified epiphora, bilateral lacrimal glands: Secondary | ICD-10-CM

## 2013-07-29 LAB — CBC WITH DIFFERENTIAL/PLATELET
BASO%: 1 % (ref 0.0–2.0)
Basophils Absolute: 0.1 10*3/uL (ref 0.0–0.1)
EOS%: 0.1 % (ref 0.0–7.0)
Eosinophils Absolute: 0 10*3/uL (ref 0.0–0.5)
HCT: 36 % (ref 34.8–46.6)
HGB: 11.6 g/dL (ref 11.6–15.9)
LYMPH%: 17.4 % (ref 14.0–49.7)
MCH: 28 pg (ref 25.1–34.0)
MCHC: 32.2 g/dL (ref 31.5–36.0)
MCV: 86.8 fL (ref 79.5–101.0)
MONO#: 1.3 10*3/uL — AB (ref 0.1–0.9)
MONO%: 15.4 % — ABNORMAL HIGH (ref 0.0–14.0)
NEUT#: 5.6 10*3/uL (ref 1.5–6.5)
NEUT%: 66.1 % (ref 38.4–76.8)
Platelets: 254 10*3/uL (ref 145–400)
RBC: 4.15 10*6/uL (ref 3.70–5.45)
RDW: 19 % — AB (ref 11.2–14.5)
WBC: 8.5 10*3/uL (ref 3.9–10.3)
lymph#: 1.5 10*3/uL (ref 0.9–3.3)

## 2013-07-29 MED ORDER — TOBRAMYCIN-DEXAMETHASONE 0.3-0.1 % OP SUSP: 1.0000 [drp] | Freq: Two times a day (BID) | OPHTHALMIC | Status: DC

## 2013-07-29 NOTE — Telephone Encounter (Signed)
Called pt on both numbers. No answer,left a detailed message to inform her that a Rx for eyedrops is at her pharmacy.Told pt to put 1 drop in each eye twice daily for tearing per Micah Flesher, PA-C. Message to be forwarded to [employer], PA-C.

## 2013-07-29 NOTE — Progress Notes (Signed)
ID: [name] OB: Apr 25, 1950  MR#: [id number]  CSN#:[id number]  PCP: Ron Parker, MD GYN:  Lahoma Crocker, MD SU: Fanny Skates, MD OTHER MD: Thea Silversmith, MD   CURRENT THERAPY: Neodjuvant chemotherapy DOCETAXEL/ADRIAMYCIN/CYTOXAN (TAC)  CHIEF COMPLAINT: Right breast cancer/neoadjuvant chemotherapy  HISTORY OF PRESENT ILLNESS: As per the previously documented note: Tanishi palpated a change in her right breast sometime in August or September 2014. She was aware that this might be significant, but waited on meds until she saw her gynecologist for a routine visit. Dr. Delsa Sale immediately he set her up for right mammography and ultrasonography performed 03/03/2013. Mammography found an area of density in the upper outer quadrant of the right breast measuring 5 cm in the with right nipple retraction. There was no evidence of skin thickening. On exam there was a firm palpable mass in the superior right breast extending from 1:00 to 9:00. The right nipple was completely retracted. There was no skin thickening. The right axilla was benign by palpation. Ultrasound showed a prominent irregular hypoechoic mass larger than the ultrasound screen. Ultrasound of the right axilla showed 2 adjacent small lymph nodes with diffusely thickened cortices, the largest measuring 1.1 cm.  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 There was no HER-2 amplification with a ratio of 1.46 by CISH and an average copy number of 2.85.  On 03/11/2013 the patient underwent bilateral breast MRI. This showed an irregular enhancing mass in the central to upper right breast with nipple retraction measuring  in total 6.8 cm. There were several adjacent enhancing nodules as well. There was no involvement of the pectoralis muscle. There were no definite morphologically abnormal right axillary lymph nodes there was no definite internal mammary or lymphadenopathy. Left breast was unremarkable.  The patient's subsequent history is as detailed below   INTERVAL HISTORY: Oniya returns alone today for followup of her locally advanced right breast cancer. She continues to receive neoadjuvant chemotherapy, receiving every 3 week docetaxel/doxorubicin/cyclophosphamide. She is due for her fifth of 6 planned cycles next week on 08/02/2013, and is here today for repeat labs and physical exam.  She receives Neulasta on day 2 for granulocyte support.  Brytney is feeling well with few complaints today. She is tolerating the chemotherapy quite well with the exception of some mild fatigue. She's had no problems with peripheral neuropathy. She denies any nausea or change in bowel habits. She continues to have some swelling in her legs bilaterally, and this has been chronic throughout her neoadjuvant treatment. The swelling decreases with elevation. She is keeping herself well hydrated and is monitoring her sodium intake.   REVIEW OF SYSTEMS: Ertha has had no fevers, chills, night sweats, or hot flashes. Her skin is darker than usual, but she's had no rashes. The nailbeds are also dark, but she denies any drainage or sensitivity. She's had no abnormal bruising or bleeding. She denies any mouth ulcers or problems swallowing. She's had no change in either bowel or bladder habits. She denies any cough, shortness of breath, orthopnea, chest pain, palpitations. She's had no abnormal headaches or dizziness. She currently denies any unusual myalgias, arthralgias, or bony pain.   A detailed review of systems is otherwise stable and noncontributory.    PAST  MEDICAL HISTORY: Past Medical History  Diagnosis Date  . Anxiety   .  Depression     history of depression after son died  . Breast cancer of upper-outer quadrant of right female breast 03/10/2013  . Hypertension     PAST SURGICAL HISTORY: Past Surgical History  Procedure Laterality Date  . Abdominal hysterectomy    . Multiple tooth extractions Bilateral   . Tonsillectomy    . Cesarean section    . Portacath placement Right 04/15/2013    Procedure: INSERTION PORT-A-CATH;  Surgeon: Adin Hector, MD;  Location: [facility];  Service: General;  Laterality: Right;  . Port a cath revision Right 04/29/2013    Procedure: PORT A CATH REVISION;  Surgeon: Adin Hector, MD;  Location: [facility];  Service: General;  Laterality: Right;    FAMILY HISTORY Family History  Problem Relation Age of Onset  . Cancer Mother   . Hypertension Mother   . Bowel Disease Mother   . Lung disease Father   . Heart disease Maternal Grandmother   . Heart disease Maternal Grandfather    the patient's mother,Flowrene Millings, had a lung mass but apparently died from unrelated causes (she was followed by Dr. Earlie Server here). She was 64 years old. The patient has little information about her father. The patient had 2 brothers, no sisters. There is no history of breast or ovarian cancer in the family to her knowledge.   GYNECOLOGIC HISTORY:   (Reviewed 07/29/2013) Menarche age 26, first live birth age 50, the patient is GX P1. She went through the change of life approximately 1999. She did not take hormone replacement.  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 She is on disability throughout her treatment. She is single, lives alone with her [place]. The patient's son Helen Hashimoto died at the age of 60 with acute leukemia. The patient has no grandchildren.    ADVANCED DIRECTIVES: Not in place, but the patient was given the appropriate documents to complete and  notarize on her 03/16/2013 visit. She intends to name her cousin, Sharrie Rothman, as her healthcare power of attorney. She can be reached at [place]:  (Updated 07/29/2013) History  Substance Use Topics  . Smoking status: Former Smoker -- 1.00 packs/day    Types: Cigarettes    Quit date: 04/12/2001  . Smokeless tobacco: Never Used  . Alcohol Use: Yes     Comment: occasional wine     Colonoscopy: Not on file  PAP: UTD/Dr. Delsa Sale  Bone density:  Never  Lipid panel:  Dec 2014/Dr. Jackson-Moore   Allergies  Allergen Reactions  . Ciprofloxacin     Patient developed severe diarrhea on cipro    Current Outpatient Prescriptions  Medication Sig Dispense Refill  . amLODipine-olmesartan (AZOR) 10-40 MG per tablet Take 1 tablet by mouth every morning.      . hydrocortisone 2.5 % cream Apply 1 application topically 2 (two) times daily.      . Multiple Vitamin (MULTIVITAMIN) capsule Take 1 capsule by mouth daily.      Marland Kitchen omeprazole (PRILOSEC) 20 MG capsule Take 1 capsule (20 mg total) by mouth daily.  30 capsule  2  . dexamethasone (DECADRON) 4 MG tablet 2 tabs by mouth twice daily with food on day before and 3 days after each chemo dose  30 tablet  2  . HYDROcodone-acetaminophen (NORCO/VICODIN) 5-325 MG per  tablet Take 1-2 tablets by mouth every 6 (six) hours as needed for moderate pain.      Marland Kitchen lidocaine-prilocaine (EMLA) cream Apply 1 application topically daily as needed. Pain port access 1-2 hours before chemo      . Loratadine (CLARITIN PO) Take 1 tablet by mouth.      Marland Kitchen LORazepam (ATIVAN) 0.5 MG tablet Take 0.5 mg by mouth. Take 1 -2 tabs at night as needed for sleep      . naproxen sodium (ANAPROX) 220 MG tablet Take 220 mg by mouth 2 (two) times daily as needed.      . ondansetron (ZOFRAN) 8 MG tablet Take 8 mg by mouth 2 (two) times daily as needed for nausea or vomiting.      . Potassium Chloride in NaCl (0.9 % NACL WITH KCL 20 MEQ / L) 20-0.9 MEQ/L-%  Please bring patient in for 20 meq potassium chloride in 500 ml 0.9% Normal saline.  Have patient return to [facility] after completion of IVF.  500 mL  ml  . prochlorperazine (COMPAZINE) 10 MG tablet Take 10 mg by mouth every 6 (six) hours as needed for nausea or vomiting. For 3 days after chemo       No current facility-administered medications for this visit.    OBJECTIVE: Middle-aged [place] American woman who appears comfortable  Filed Vitals:   07/29/13 0914  BP: 127/83  Pulse: 86  Temp: 98.2 F (36.8 C)  Resp: 18     Body mass index is 41.59 kg/(m^2).    ECOG FS: 1 Filed Weights   07/29/13 0914  Weight: 220 lb (99.791 kg)   Physical Exam: HEENT:  Sclerae anicteric.  Oropharynx pink and moist. Poor dentition.  No ulcerations, mucositis, or candidiasis. Neck supple, trachea midline.   NODES:  No cervical or supraclavicular lymphadenopathy palpated.  BREAST EXAM:   In the right breast there is a palpable mass in the central portion of the breast measuring approximately 3 cm in diameter. It is softer than previously examined. There is still nipple inversion. Left breast is unremarkable. Axillae are benign bilaterally with no palpable lymphadenopathy.  LUNGS:  Clear to auscultation bilaterally with good excursion.  No wheezes or rhonchi HEART:  Regular rate and rhythm. No murmur.  ABDOMEN:  Soft, obese, nontender. No hepatomegaly or masses.  Positive bowel sounds.  MSK:  No focal spinal tenderness to palpation.   Good range of motion bilaterally in the upper extremities. EXTREMITIES:   1+ pitting edema bilaterally in the lower extremities. Equal bilaterally. No erythema or palpable cords noted. No swelling noted in the upper extremities.   SKIN: Hyperpigmentation bilaterally in the hands and nailbeds. No drainage from the nails and no evidence of infection. No  rashes are noted. No excessive ecchymoses. No petechiae. Port is intact in the right upper chest wall with no erythema, edema, or  evidence of infection/cellulitis. NEURO:  Nonfocal. Well oriented.  Appropriate affect.    LAB RESULTS:   Lab Results  Component Value Date   WBC 8.5 07/29/2013   NEUTROABS 5.6 07/29/2013   HGB 11.6 07/29/2013   HCT 36.0 07/29/2013   MCV 86.8 07/29/2013   PLT 254 07/29/2013      Chemistry      Component Value Date/Time   NA 145 07/12/2013 0927   NA 141 04/04/2013 1031   K 4.3 07/12/2013 0927   K 3.6* 04/04/2013 1031   CL 103 04/04/2013 1031   CO2 25 07/12/2013 0927   CO2  27 04/04/2013 1031   BUN 13.2 07/12/2013 0927   BUN 16 04/04/2013 1031   CREATININE 0.8 07/12/2013 0927   CREATININE 0.81 04/04/2013 1031   CREATININE 0.75 03/02/2013 1135      Component Value Date/Time   CALCIUM 10.3 07/12/2013 0927   CALCIUM 9.3 04/04/2013 1031   ALKPHOS 89 07/12/2013 0927   ALKPHOS 74 04/04/2013 1031   AST 16 07/12/2013 0927   AST 16 04/04/2013 1031   ALT 36 07/12/2013 0927   ALT 23 04/04/2013 1031   BILITOT <0.20 07/12/2013 0927   BILITOT 0.3 04/04/2013 1031       STUDIES:  Echocardiogram on 03/29/2013 showed an ejection fraction of 55-60%.  Mr Breast Bilateral W Wo Contrast 07/11/2013   CLINICAL DATA:  63 year old female recently diagnosed with grade 3 invasive mammary carcinoma in the right breast. The patient is receiving neoadjuvant chemotherapy. A biopsied right axillary lymph node was negative.  LABS:  None.  EXAM: BILATERAL BREAST MRI WITH AND WITHOUT CONTRAST  TECHNIQUE: Multiplanar, multisequence MR images of both breasts were obtained prior to and following the intravenous administration of 10m of MultiHance.  THREE-DIMENSIONAL MR IMAGE RENDERING ON INDEPENDENT WORKSTATION:  Three-dimensional MR images were rendered by post-processing of the original MR data on an independent workstation. The three-dimensional MR images were interpreted, and findings are reported in the following complete MRI report for this study. Three dimensional images were evaluated at the independent DynaCad workstation  COMPARISON:   Mammograms dated 03/04/2013 and 03/03/2013. Prior MRI stated 03/11/2013.  FINDINGS: Breast composition: c:  Heterogeneous fibroglandular tissue  Background parenchymal enhancement: Moderate  Right breast: Irregular enhancing mass in the central and upper outer quadrant of the right breast extending to and retracting the nipple measures 2.8 cm AP, 4.8 cm transverse and 3.1 cm cranial caudal. On the prior MRI dated 03/11/2013 the mass measured 4.1 cm AP, 6.8 cm transverse and 4.9 cm cranial caudal.  Left breast: No mass or abnormal enhancement.  Lymph nodes: No abnormal appearing lymph nodes.  Ancillary findings:  A right sided Port-A-Cath has been placed.  IMPRESSION: Interval reduction in the known invasive mammary carcinoma in the right breast, as described above.  RECOMMENDATION: Continue treatment planning is recommended.  BI-RADS CATEGORY  6: Known biopsy-proven malignancy - appropriate action should be taken.   Electronically Signed   By: DLillia MountainM.D.   On: 07/11/2013 09:27     ASSESSMENT: 63y.o. Denise Becker woman   (1)  status post right breast biopsy 03/04/2013 for a clinical T3 N1, stage IIIA invasive ductal carcinoma, grade 3, estrogen receptor 100% positive, progesterone receptor 100% positive, with an MIB-1 of 20% and no HER-2 amplification.  (2) right axillary lymph node biopsy 03/04/2013 was benign   (3) status post Port-A-Cath remission on 04/29/2013  (4) Neoadjuvant chemotherapy with docetaxel doxorubicin and cyclophosphamide - start date  on 05/03/2013 to be repeated once every 3 weeks for 6 cycles.   (5) Neulasta on day 2 of each cycle for granulocyte support  (6)  patient will likely need a mastectomy following neoadjuvant chemotherapy.  (7) GERD -  controlled with omeprazole  (8) history of right shoulder dislocation with intermittent right shoulder pain - controlled with Aleve  (9)  swelling in the lower extremities (Doppler study of the right lower extremity was  negative for a DVT on 06/02/2013.)   PLAN: CParaskeviwill return as scheduled next week on may fifth for her fifth neoadjuvant cycle of chemotherapy. We will check a metabolic  panel at that time prior to treatment. She will return on day 2 for her Neulasta injection, and is scheduled to see her again in 2 weeks on May 13 for assessment of chemotoxicity.  Our plan is to complete a total of 6 cycles, then repeat a breast MRI in early June prior to proceeding to definitive surgery.   All these appointments have been scheduled accordingly.  All of the above is reviewed in detail with the patient today. She voices her understanding and agreement with this plan. She knows to call with any changes or problems.  Theotis Burrow, PA-C  Medical Oncology   07/29/2013 9:52 AM

## 2013-07-29 NOTE — Telephone Encounter (Signed)
, °

## 2013-08-02 ENCOUNTER — Other Ambulatory Visit (HOSPITAL_BASED_OUTPATIENT_CLINIC_OR_DEPARTMENT_OTHER): Payer: 59

## 2013-08-02 ENCOUNTER — Ambulatory Visit (HOSPITAL_BASED_OUTPATIENT_CLINIC_OR_DEPARTMENT_OTHER): Payer: 59

## 2013-08-02 VITALS — BP 135/68 | HR 82 | Temp 98.4°F

## 2013-08-02 DIAGNOSIS — C50419 Malignant neoplasm of upper-outer quadrant of unspecified female breast: Secondary | ICD-10-CM

## 2013-08-02 DIAGNOSIS — C50411 Malignant neoplasm of upper-outer quadrant of right female breast: Secondary | ICD-10-CM

## 2013-08-02 DIAGNOSIS — Z5111 Encounter for antineoplastic chemotherapy: Secondary | ICD-10-CM

## 2013-08-02 LAB — CBC WITH DIFFERENTIAL/PLATELET
BASO%: 1.1 % (ref 0.0–2.0)
BASOS ABS: 0.1 10*3/uL (ref 0.0–0.1)
EOS ABS: 0.1 10*3/uL (ref 0.0–0.5)
EOS%: 0.7 % (ref 0.0–7.0)
HEMATOCRIT: 36.1 % (ref 34.8–46.6)
HEMOGLOBIN: 11.7 g/dL (ref 11.6–15.9)
LYMPH#: 1.5 10*3/uL (ref 0.9–3.3)
LYMPH%: 14.9 % (ref 14.0–49.7)
MCH: 28.2 pg (ref 25.1–34.0)
MCHC: 32.3 g/dL (ref 31.5–36.0)
MCV: 87.3 fL (ref 79.5–101.0)
MONO#: 2 10*3/uL — ABNORMAL HIGH (ref 0.1–0.9)
MONO%: 20.1 % — ABNORMAL HIGH (ref 0.0–14.0)
NEUT%: 63.2 % (ref 38.4–76.8)
NEUTROS ABS: 6.2 10*3/uL (ref 1.5–6.5)
Platelets: 399 10*3/uL (ref 145–400)
RBC: 4.13 10*6/uL (ref 3.70–5.45)
RDW: 18.9 % — AB (ref 11.2–14.5)
WBC: 9.8 10*3/uL (ref 3.9–10.3)

## 2013-08-02 LAB — COMPREHENSIVE METABOLIC PANEL (CC13)
ALT: 21 U/L (ref 0–55)
AST: 13 U/L (ref 5–34)
Albumin: 3.4 g/dL — ABNORMAL LOW (ref 3.5–5.0)
Alkaline Phosphatase: 89 U/L (ref 40–150)
Anion Gap: 10 mEq/L (ref 3–11)
BILIRUBIN TOTAL: 0.22 mg/dL (ref 0.20–1.20)
BUN: 14.3 mg/dL (ref 7.0–26.0)
CALCIUM: 9.9 mg/dL (ref 8.4–10.4)
CHLORIDE: 110 meq/L — AB (ref 98–109)
CO2: 24 meq/L (ref 22–29)
CREATININE: 0.9 mg/dL (ref 0.6–1.1)
GLUCOSE: 97 mg/dL (ref 70–140)
Potassium: 3.6 mEq/L (ref 3.5–5.1)
Sodium: 144 mEq/L (ref 136–145)
Total Protein: 7.1 g/dL (ref 6.4–8.3)

## 2013-08-02 MED ORDER — DEXAMETHASONE SODIUM PHOSPHATE 20 MG/5ML IJ SOLN
12.0000 mg | Freq: Once | INTRAMUSCULAR | Status: AC
  Administered 2013-08-02: 12 mg via INTRAVENOUS

## 2013-08-02 MED ORDER — DEXAMETHASONE SODIUM PHOSPHATE 20 MG/5ML IJ SOLN
INTRAMUSCULAR | Status: AC
  Filled 2013-08-02: qty 5

## 2013-08-02 MED ORDER — SODIUM CHLORIDE 0.9 % IV SOLN
500.0000 mg/m2 | Freq: Once | INTRAVENOUS | Status: AC
  Administered 2013-08-02: 1020 mg via INTRAVENOUS
  Filled 2013-08-02: qty 51

## 2013-08-02 MED ORDER — HEPARIN SOD (PORK) LOCK FLUSH 100 UNIT/ML IV SOLN
500.0000 [IU] | Freq: Once | INTRAVENOUS | Status: AC | PRN
  Administered 2013-08-02: 500 [IU]
  Filled 2013-08-02: qty 5

## 2013-08-02 MED ORDER — DOCETAXEL CHEMO INJECTION 160 MG/16ML
75.0000 mg/m2 | Freq: Once | INTRAVENOUS | Status: AC
  Administered 2013-08-02: 150 mg via INTRAVENOUS
  Filled 2013-08-02: qty 15

## 2013-08-02 MED ORDER — PALONOSETRON HCL INJECTION 0.25 MG/5ML
INTRAVENOUS | Status: AC
  Filled 2013-08-02: qty 5

## 2013-08-02 MED ORDER — SODIUM CHLORIDE 0.9 % IV SOLN
150.0000 mg | Freq: Once | INTRAVENOUS | Status: AC
  Administered 2013-08-02: 150 mg via INTRAVENOUS
  Filled 2013-08-02: qty 5

## 2013-08-02 MED ORDER — DOXORUBICIN HCL CHEMO IV INJECTION 2 MG/ML
50.0000 mg/m2 | Freq: Once | INTRAVENOUS | Status: AC
  Administered 2013-08-02: 102 mg via INTRAVENOUS
  Filled 2013-08-02: qty 51

## 2013-08-02 MED ORDER — PALONOSETRON HCL INJECTION 0.25 MG/5ML
0.2500 mg | Freq: Once | INTRAVENOUS | Status: AC
  Administered 2013-08-02: 0.25 mg via INTRAVENOUS

## 2013-08-02 MED ORDER — SODIUM CHLORIDE 0.9 % IV SOLN
Freq: Once | INTRAVENOUS | Status: AC
  Administered 2013-08-02: 15:00:00 via INTRAVENOUS

## 2013-08-02 MED ORDER — SODIUM CHLORIDE 0.9 % IJ SOLN
10.0000 mL | INTRAMUSCULAR | Status: DC | PRN
  Administered 2013-08-02: 10 mL
  Filled 2013-08-02: qty 10

## 2013-08-02 NOTE — Patient Instructions (Signed)
[facility] Discharge Instructions for Patients Receiving Chemotherapy  Today you received the following chemotherapy agents: Adriamycin, Cytoxan and Taxotere.  To help prevent nausea and vomiting after your treatment, we encourage you to take your nausea medication, Compazine. Take one every six hours as needed for the next 3 days. For nausea on or after 5/9, use Zofran.    If you develop nausea and vomiting that is not controlled by your nausea medication, call the clinic.   BELOW ARE SYMPTOMS THAT SHOULD BE REPORTED IMMEDIATELY:  *FEVER GREATER THAN 100.5 F  *CHILLS WITH OR WITHOUT FEVER  NAUSEA AND VOMITING THAT IS NOT CONTROLLED WITH YOUR NAUSEA MEDICATION  *UNUSUAL SHORTNESS OF BREATH  *UNUSUAL BRUISING OR BLEEDING  TENDERNESS IN MOUTH AND THROAT WITH OR WITHOUT PRESENCE OF ULCERS  *URINARY PROBLEMS  *BOWEL PROBLEMS  UNUSUAL RASH Items with * indicate a potential emergency and should be followed up as soon as possible.  Feel free to call the clinic should you have any questions or concerns. The clinic phone number is [phone].

## 2013-08-03 ENCOUNTER — Ambulatory Visit (HOSPITAL_BASED_OUTPATIENT_CLINIC_OR_DEPARTMENT_OTHER): Payer: 59

## 2013-08-03 VITALS — BP 125/68 | HR 85 | Temp 97.8°F

## 2013-08-03 DIAGNOSIS — Z5111 Encounter for antineoplastic chemotherapy: Secondary | ICD-10-CM

## 2013-08-03 DIAGNOSIS — C50411 Malignant neoplasm of upper-outer quadrant of right female breast: Secondary | ICD-10-CM

## 2013-08-03 DIAGNOSIS — C50419 Malignant neoplasm of upper-outer quadrant of unspecified female breast: Secondary | ICD-10-CM

## 2013-08-03 MED ORDER — PEGFILGRASTIM INJECTION 6 MG/0.6ML
6.0000 mg | Freq: Once | SUBCUTANEOUS | Status: AC
  Administered 2013-08-03: 6 mg via SUBCUTANEOUS
  Filled 2013-08-03: qty 0.6

## 2013-08-10 ENCOUNTER — Ambulatory Visit (HOSPITAL_BASED_OUTPATIENT_CLINIC_OR_DEPARTMENT_OTHER): Payer: 59 | Admitting: Physician Assistant

## 2013-08-10 ENCOUNTER — Encounter: Payer: Self-pay | Admitting: Physician Assistant

## 2013-08-10 ENCOUNTER — Other Ambulatory Visit (HOSPITAL_BASED_OUTPATIENT_CLINIC_OR_DEPARTMENT_OTHER): Payer: 59

## 2013-08-10 VITALS — BP 114/78 | HR 90 | Temp 99.1°F | Resp 18 | Ht 61.0 in | Wt 218.5 lb

## 2013-08-10 DIAGNOSIS — K219 Gastro-esophageal reflux disease without esophagitis: Secondary | ICD-10-CM

## 2013-08-10 DIAGNOSIS — M7989 Other specified soft tissue disorders: Secondary | ICD-10-CM

## 2013-08-10 DIAGNOSIS — R600 Localized edema: Secondary | ICD-10-CM

## 2013-08-10 DIAGNOSIS — C50419 Malignant neoplasm of upper-outer quadrant of unspecified female breast: Secondary | ICD-10-CM

## 2013-08-10 DIAGNOSIS — M25519 Pain in unspecified shoulder: Secondary | ICD-10-CM

## 2013-08-10 DIAGNOSIS — C50411 Malignant neoplasm of upper-outer quadrant of right female breast: Secondary | ICD-10-CM

## 2013-08-10 DIAGNOSIS — Z170 Estrogen receptor positive status [ER+]: Secondary | ICD-10-CM

## 2013-08-10 LAB — COMPREHENSIVE METABOLIC PANEL (CC13)
ALK PHOS: 89 U/L (ref 40–150)
ALT: 15 U/L (ref 0–55)
AST: 9 U/L (ref 5–34)
Albumin: 3.2 g/dL — ABNORMAL LOW (ref 3.5–5.0)
Anion Gap: 10 mEq/L (ref 3–11)
BUN: 10.1 mg/dL (ref 7.0–26.0)
CALCIUM: 9.7 mg/dL (ref 8.4–10.4)
CHLORIDE: 107 meq/L (ref 98–109)
CO2: 24 mEq/L (ref 22–29)
Creatinine: 0.8 mg/dL (ref 0.6–1.1)
Glucose: 89 mg/dl (ref 70–140)
Potassium: 3.8 mEq/L (ref 3.5–5.1)
Sodium: 141 mEq/L (ref 136–145)
Total Bilirubin: <0.2 mg/dL (ref 0.20–1.20)
Total Protein: 6.7 g/dL (ref 6.4–8.3)

## 2013-08-10 LAB — CBC WITH DIFFERENTIAL/PLATELET
BASO%: 0.8 % (ref 0.0–2.0)
BASOS ABS: 0.1 10*3/uL (ref 0.0–0.1)
EOS%: 0.5 % (ref 0.0–7.0)
Eosinophils Absolute: 0 10*3/uL (ref 0.0–0.5)
HEMATOCRIT: 35.1 % (ref 34.8–46.6)
HEMOGLOBIN: 11.4 g/dL — AB (ref 11.6–15.9)
LYMPH%: 21.3 % (ref 14.0–49.7)
MCH: 28.1 pg (ref 25.1–34.0)
MCHC: 32.5 g/dL (ref 31.5–36.0)
MCV: 86.5 fL (ref 79.5–101.0)
MONO#: 1.7 10*3/uL — ABNORMAL HIGH (ref 0.1–0.9)
MONO%: 25.5 % — ABNORMAL HIGH (ref 0.0–14.0)
NEUT#: 3.4 10*3/uL (ref 1.5–6.5)
NEUT%: 51.9 % (ref 38.4–76.8)
Platelets: 123 10*3/uL — ABNORMAL LOW (ref 145–400)
RBC: 4.06 10*6/uL (ref 3.70–5.45)
RDW: 17.2 % — ABNORMAL HIGH (ref 11.2–14.5)
WBC: 6.5 10*3/uL (ref 3.9–10.3)
lymph#: 1.4 10*3/uL (ref 0.9–3.3)

## 2013-08-10 NOTE — Progress Notes (Signed)
ID: [name] OB: Aug 22, 1950  MR#: [id number]  CSN#:[id number]  PCP: Ron Parker, MD GYN:  Lahoma Crocker, MD SU: Fanny Skates, MD OTHER MD: Thea Silversmith, MD   CURRENT THERAPY: Neodjuvant chemotherapy DOCETAXEL/ADRIAMYCIN/CYTOXAN (TAC)  CHIEF COMPLAINT: Right breast cancer/neoadjuvant chemotherapy  HISTORY OF PRESENT ILLNESS: As per the previously documented note: Denise Becker palpated a change in her right breast sometime in August or September 2014. She was aware that this might be significant, but waited on meds until she saw her gynecologist for a routine visit. Dr. Delsa Sale immediately he set her up for right mammography and ultrasonography performed 03/03/2013. Mammography found an area of density in the upper outer quadrant of the right breast measuring 5 cm in the with right nipple retraction. There was no evidence of skin thickening. On exam there was a firm palpable mass in the superior right breast extending from 1:00 to 9:00. The right nipple was completely retracted. There was no skin thickening. The right axilla was benign by palpation. Ultrasound showed a prominent irregular hypoechoic mass larger than the ultrasound screen. Ultrasound of the right axilla showed 2 adjacent small lymph nodes with diffusely thickened cortices, the largest measuring 1.1 cm.  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 There was no HER-2 amplification with a ratio of 1.46 by CISH and an average copy number of 2.85.  On 03/11/2013 the patient underwent bilateral breast MRI. This showed an irregular enhancing mass in the central to upper right breast with nipple retraction measuring  in total 6.8 cm. There were several adjacent enhancing nodules as well. There was no involvement of the pectoralis muscle. There were no definite morphologically abnormal right axillary lymph nodes there was no definite internal mammary or lymphadenopathy. Left breast was unremarkable.  The patient's subsequent history is as detailed below   INTERVAL HISTORY: Denise Becker returns alone today for followup of her locally advanced right breast cancer. She continues to receive neoadjuvant chemotherapy, receiving every 3 week docetaxel/doxorubicin/cyclophosphamide is currently day 9 cycle 5 of 6 planned cycles.   She receives Neulasta on day 2 for granulocyte support.  Alixis continues to tolerate treatment well. Her biggest complaints continue to be some mild fatigue and swelling bilaterally in the lower extremities. The swelling does improve significantly with elevation, and it is equal bilaterally. The only peripheral neuropathy she has noticed is some mild tingling in her feet when they are swallowing. Otherwise, she has no additional signs of peripheral neuropathy.   REVIEW OF SYSTEMS: Tinika has had no fevers, chills, night sweats, or hot flashes. She denies any significant skin changes and has had no new rashes. She denies any abnormal bruising or bleeding. She's had no mouth ulcers, oral sensitivity, or problems swallowing. Her appetite is fairly good. She denies any nausea and has had no change in bowel or bladder habits. She denies any cough, phlegm production shortness of breath, orthopnea, chest pain, palpitations. She's had no abnormal headaches, change in vision, or dizziness. She currently denies any unusual myalgias, arthralgias, or bony pain.   A detailed review of systems is otherwise stable and noncontributory.    PAST MEDICAL HISTORY: Past Medical History  Diagnosis Date  . Anxiety   . Depression     history of  depression after son died  . Breast cancer of upper-outer quadrant of  right female breast 03/10/2013  . Hypertension     PAST SURGICAL HISTORY: Past Surgical History  Procedure Laterality Date  . Abdominal hysterectomy    . Multiple tooth extractions Bilateral   . Tonsillectomy    . Cesarean section    . Portacath placement Right 04/15/2013    Procedure: INSERTION PORT-A-CATH;  Surgeon: Adin Hector, MD;  Location: [facility];  Service: General;  Laterality: Right;  . Port a cath revision Right 04/29/2013    Procedure: PORT A CATH REVISION;  Surgeon: Adin Hector, MD;  Location: [facility];  Service: General;  Laterality: Right;    FAMILY HISTORY Family History  Problem Relation Age of Onset  . Cancer Mother   . Hypertension Mother   . Bowel Disease Mother   . Lung disease Father   . Heart disease Maternal Grandmother   . Heart disease Maternal Grandfather    the patient's mother,Flowrene Millings, had a lung mass but apparently died from unrelated causes (she was followed by Dr. Earlie Server here). She was 64 years old. The patient has little information about her father. The patient had 2 brothers, no sisters. There is no history of breast or ovarian cancer in the family to her knowledge.   GYNECOLOGIC HISTORY:   (Reviewed 08/10/2013) Menarche age 87, first live birth age 58, the patient is GX P1. She went through the change of life approximately 1999. She did not take hormone replacement.  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 She is on disability throughout her treatment. She is single, lives alone with her [place]. The patient's son Denise Becker died at the age of 47 with acute leukemia. The patient has no grandchildren.    ADVANCED DIRECTIVES: Not in place, but the patient was given the appropriate documents to complete and notarize on her 03/16/2013 visit. She intends to name her cousin, Sharrie Rothman, as her healthcare power  of attorney. She can be reached at [place]:  (Updated 08/10/2013) History  Substance Use Topics  . Smoking status: Former Smoker -- 1.00 packs/day    Types: Cigarettes    Quit date: 04/12/2001  . Smokeless tobacco: Never Used  . Alcohol Use: Yes     Comment: occasional wine     Colonoscopy: Not on file  PAP: UTD/Dr. Delsa Sale  Bone density:  Never  Lipid panel:  Dec 2014/Dr. Jackson-Moore   Allergies  Allergen Reactions  . Ciprofloxacin     Patient developed severe diarrhea on cipro    Current Outpatient Prescriptions  Medication Sig Dispense Refill  . amLODipine-olmesartan (AZOR) 10-40 MG per tablet Take 1 tablet by mouth every morning.      . hydrocortisone 2.5 % cream Apply 1 application topically 2 (two) times daily.      . Loratadine (CLARITIN PO) Take 1 tablet by mouth.      . Multiple Vitamin (MULTIVITAMIN) capsule Take 1 capsule by mouth daily.      Marland Kitchen omeprazole (PRILOSEC) 20 MG capsule Take 1 capsule (20 mg total) by mouth daily.  30 capsule  2  . tobramycin-dexamethasone (TOBRADEX) ophthalmic solution Place 1 drop into both eyes 2 (two) times daily.  5 mL  1  . dexamethasone (DECADRON) 4 MG tablet 2 tabs by mouth twice daily with food on day before and 3 days after each  chemo dose  30 tablet  2  . HYDROcodone-acetaminophen (NORCO/VICODIN) 5-325 MG per tablet Take 1-2 tablets by mouth every 6 (six) hours as needed for moderate pain.      Marland Kitchen lidocaine-prilocaine (EMLA) cream Apply 1 application topically daily as needed. Pain port access 1-2 hours before chemo      . LORazepam (ATIVAN) 0.5 MG tablet Take 0.5 mg by mouth. Take 1 -2 tabs at night as needed for sleep      . naproxen sodium (ANAPROX) 220 MG tablet Take 220 mg by mouth 2 (two) times daily as needed.      . ondansetron (ZOFRAN) 8 MG tablet Take 8 mg by mouth 2 (two) times daily as needed for nausea or vomiting.      . Potassium Chloride in NaCl (0.9 % NACL WITH KCL 20 MEQ / L)  20-0.9 MEQ/L-% Please bring patient in for 20 meq potassium chloride in 500 ml 0.9% Normal saline.  Have patient [facility] after completion of IVF.  500 mL  ml  . prochlorperazine (COMPAZINE) 10 MG tablet Take 10 mg by mouth every 6 (six) hours as needed for nausea or vomiting. For 3 days after chemo       No current facility-administered medications for this visit.    OBJECTIVE: Middle-aged [place] American woman who appears comfortable  Filed Vitals:   08/10/13 0949  BP: 114/78  Pulse: 90  Temp: 99.1 F (37.3 C)  Resp: 18     Body mass index is 41.31 kg/(m^2).    ECOG FS: 1 Filed Weights   08/10/13 0949  Weight: 218 lb 8 oz (99.111 kg)   Physical Exam: HEENT:  Sclerae anicteric.  Oropharynx pink and moist. Poor dentition.  No evidence of mucositis or oropharyngeal candidiasis. Neck supple, trachea midline.   NODES:  No cervical or supraclavicular lymphadenopathy palpated.  BREAST EXAM:  Deferred. Axillae are benign bilaterally with no palpable lymphadenopathy.  LUNGS:  Clear to auscultation bilaterally with good excursion.  No crackles, wheezes or rhonchi HEART:  Regular rate and rhythm. No murmur.  ABDOMEN:  Soft, obese, nontender. No hepatomegaly or masses palpated.  Positive bowel sounds.  MSK:  No focal spinal tenderness to palpation.   Good range of motion bilaterally in the upper extremities. EXTREMITIES:   1+ pitting edema bilaterally in the lower extremities, equal bilaterally. No erythema or palpable cords noted. No swelling noted in the upper extremities.   SKIN: Hyperpigmentation bilaterally in the hands and nailbeds. No visible drainage from the nails and no evidence of infection. No  additional skin changes are noted. No excessive ecchymoses. No petechiae. Port is intact in the right upper chest wall with no erythema, edema, or evidence of infection/cellulitis. NEURO:  Nonfocal. Well oriented.  Appropriate affect.    LAB RESULTS:   Lab Results  Component  Value Date   WBC 6.5 08/10/2013   NEUTROABS 3.4 08/10/2013   HGB 11.4* 08/10/2013   HCT 35.1 08/10/2013   MCV 86.5 08/10/2013   PLT 123* 08/10/2013      Chemistry      Component Value Date/Time   NA 141 08/10/2013 0928   NA 141 04/04/2013 1031   K 3.8 08/10/2013 0928   K 3.6* 04/04/2013 1031   CL 103 04/04/2013 1031   CO2 24 08/10/2013 0928   CO2 27 04/04/2013 1031   BUN 10.1 08/10/2013 0928   BUN 16 04/04/2013 1031   CREATININE 0.8 08/10/2013 0928   CREATININE 0.81 04/04/2013 1031  CREATININE 0.75 03/02/2013 1135      Component Value Date/Time   CALCIUM 9.7 08/10/2013 0928   CALCIUM 9.3 04/04/2013 1031   ALKPHOS 89 08/10/2013 0928   ALKPHOS 74 04/04/2013 1031   AST 9 08/10/2013 0928   AST 16 04/04/2013 1031   ALT 15 08/10/2013 0928   ALT 23 04/04/2013 1031   BILITOT <0.20 08/10/2013 0928   BILITOT 0.3 04/04/2013 1031       STUDIES:  Echocardiogram on 03/29/2013 showed an ejection fraction of 55-60%.  Mr Breast Bilateral W Wo Contrast 07/11/2013   CLINICAL DATA:  63 year old female recently diagnosed with grade 3 invasive mammary carcinoma in the right breast. The patient is receiving neoadjuvant chemotherapy. A biopsied right axillary lymph node was negative.  LABS:  None.  EXAM: BILATERAL BREAST MRI WITH AND WITHOUT CONTRAST  TECHNIQUE: Multiplanar, multisequence MR images of both breasts were obtained prior to and following the intravenous administration of 6m of MultiHance.  THREE-DIMENSIONAL MR IMAGE RENDERING ON INDEPENDENT WORKSTATION:  Three-dimensional MR images were rendered by post-processing of the original MR data on an independent workstation. The three-dimensional MR images were interpreted, and findings are reported in the following complete MRI report for this study. Three dimensional images were evaluated at the independent DynaCad workstation  COMPARISON:  Mammograms dated 03/04/2013 and 03/03/2013. Prior MRI stated 03/11/2013.  FINDINGS: Breast composition: c:  Heterogeneous  fibroglandular tissue  Background parenchymal enhancement: Moderate  Right breast: Irregular enhancing mass in the central and upper outer quadrant of the right breast extending to and retracting the nipple measures 2.8 cm AP, 4.8 cm transverse and 3.1 cm cranial caudal. On the prior MRI dated 03/11/2013 the mass measured 4.1 cm AP, 6.8 cm transverse and 4.9 cm cranial caudal.  Left breast: No mass or abnormal enhancement.  Lymph nodes: No abnormal appearing lymph nodes.  Ancillary findings:  A right sided Port-A-Cath has been placed.  IMPRESSION: Interval reduction in the known invasive mammary carcinoma in the right breast, as described above.  RECOMMENDATION: Continue treatment planning is recommended.  BI-RADS CATEGORY  6: Known biopsy-proven malignancy - appropriate action should be taken.   Electronically Signed   By: DLillia MountainM.D.   On: 07/11/2013 09:27     ASSESSMENT: 63y.o. [place] woman   (1)  status post right breast biopsy 03/04/2013 for a clinical T3 N1, stage IIIA invasive ductal carcinoma, grade 3, estrogen receptor 100% positive, progesterone receptor 100% positive, with an MIB-1 of 20% and no HER-2 amplification.  (2) right axillary lymph node biopsy 03/04/2013 was benign   (3) status post Port-A-Cath remission on 04/29/2013  (4) Neoadjuvant chemotherapy with docetaxel doxorubicin and cyclophosphamide - start date 05/03/2013, to be repeated once every 3 weeks for 6 cycles prior to definitive surgery.   (5) Neulasta on day 2 of each cycle for granulocyte support  (6)  patient will likely need a mastectomy following neoadjuvant chemotherapy.  (7) GERD -  controlled with omeprazole  (8) history of right shoulder dislocation with intermittent right shoulder pain - controlled with Aleve  (9)  swelling in the lower extremities, stable; improves with elevation (Doppler study of the right lower extremity was negative for a DVT on 06/02/2013.)   PLAN: CGenevecontinues to  tolerate her neoadjuvant chemotherapy well. She'll return on May 26 for her sixth and final dose of neoadjuvant docetaxel/doxorubicin/cyclophosphamide. She is already scheduled for a final breast MRI on June 1 after which she will be meeting with both Dr. MJana Hakimand  Dr. Dalbert Batman to discuss her definitive surgery.  All of the above is reviewed in detail with the patient today. She voices her understanding and agreement with this plan. She knows to call with any changes or problems.  Theotis Burrow, PA-C  Medical Oncology   08/10/2013 4:31 PM

## 2013-08-18 ENCOUNTER — Encounter: Payer: Self-pay | Admitting: Oncology

## 2013-08-18 NOTE — Progress Notes (Signed)
Called and left a message to see if she wants her bills 06/2013 mailed or will she come and pick up

## 2013-08-23 ENCOUNTER — Encounter: Payer: Self-pay | Admitting: Physician Assistant

## 2013-08-23 ENCOUNTER — Ambulatory Visit (HOSPITAL_BASED_OUTPATIENT_CLINIC_OR_DEPARTMENT_OTHER): Payer: 59 | Admitting: Physician Assistant

## 2013-08-23 ENCOUNTER — Other Ambulatory Visit: Payer: Self-pay

## 2013-08-23 ENCOUNTER — Other Ambulatory Visit: Payer: Self-pay | Admitting: Oncology

## 2013-08-23 ENCOUNTER — Ambulatory Visit (HOSPITAL_BASED_OUTPATIENT_CLINIC_OR_DEPARTMENT_OTHER): Payer: 59

## 2013-08-23 ENCOUNTER — Other Ambulatory Visit (HOSPITAL_BASED_OUTPATIENT_CLINIC_OR_DEPARTMENT_OTHER): Payer: 59

## 2013-08-23 VITALS — BP 133/80 | HR 88 | Temp 97.7°F | Resp 18 | Ht 61.0 in | Wt 225.8 lb

## 2013-08-23 DIAGNOSIS — R600 Localized edema: Secondary | ICD-10-CM

## 2013-08-23 DIAGNOSIS — R198 Other specified symptoms and signs involving the digestive system and abdomen: Secondary | ICD-10-CM

## 2013-08-23 DIAGNOSIS — C50419 Malignant neoplasm of upper-outer quadrant of unspecified female breast: Secondary | ICD-10-CM

## 2013-08-23 DIAGNOSIS — K439 Ventral hernia without obstruction or gangrene: Secondary | ICD-10-CM

## 2013-08-23 DIAGNOSIS — C50411 Malignant neoplasm of upper-outer quadrant of right female breast: Secondary | ICD-10-CM

## 2013-08-23 DIAGNOSIS — Z170 Estrogen receptor positive status [ER+]: Secondary | ICD-10-CM

## 2013-08-23 DIAGNOSIS — K219 Gastro-esophageal reflux disease without esophagitis: Secondary | ICD-10-CM

## 2013-08-23 DIAGNOSIS — M7989 Other specified soft tissue disorders: Secondary | ICD-10-CM

## 2013-08-23 DIAGNOSIS — G609 Hereditary and idiopathic neuropathy, unspecified: Secondary | ICD-10-CM

## 2013-08-23 DIAGNOSIS — Z5111 Encounter for antineoplastic chemotherapy: Secondary | ICD-10-CM

## 2013-08-23 LAB — COMPREHENSIVE METABOLIC PANEL (CC13)
ALK PHOS: 71 U/L (ref 40–150)
ALT: 22 U/L (ref 0–55)
AST: 13 U/L (ref 5–34)
Albumin: 3.6 g/dL (ref 3.5–5.0)
Anion Gap: 10 mEq/L (ref 3–11)
BUN: 13.4 mg/dL (ref 7.0–26.0)
CALCIUM: 9.9 mg/dL (ref 8.4–10.4)
CHLORIDE: 111 meq/L — AB (ref 98–109)
CO2: 23 mEq/L (ref 22–29)
Creatinine: 0.8 mg/dL (ref 0.6–1.1)
GLUCOSE: 112 mg/dL (ref 70–140)
POTASSIUM: 3.5 meq/L (ref 3.5–5.1)
Sodium: 144 mEq/L (ref 136–145)
Total Bilirubin: 0.27 mg/dL (ref 0.20–1.20)
Total Protein: 7.1 g/dL (ref 6.4–8.3)

## 2013-08-23 LAB — CBC WITH DIFFERENTIAL/PLATELET
BASO%: 0.4 % (ref 0.0–2.0)
BASOS ABS: 0.1 10*3/uL (ref 0.0–0.1)
EOS ABS: 0 10*3/uL (ref 0.0–0.5)
EOS%: 0 % (ref 0.0–7.0)
HCT: 35.7 % (ref 34.8–46.6)
HGB: 11.3 g/dL — ABNORMAL LOW (ref 11.6–15.9)
LYMPH%: 8.4 % — ABNORMAL LOW (ref 14.0–49.7)
MCH: 28 pg (ref 25.1–34.0)
MCHC: 31.6 g/dL (ref 31.5–36.0)
MCV: 88.5 fL (ref 79.5–101.0)
MONO#: 2.3 10*3/uL — ABNORMAL HIGH (ref 0.1–0.9)
MONO%: 15.2 % — AB (ref 0.0–14.0)
NEUT%: 76 % (ref 38.4–76.8)
NEUTROS ABS: 11.6 10*3/uL — AB (ref 1.5–6.5)
Platelets: 440 10*3/uL — ABNORMAL HIGH (ref 145–400)
RBC: 4.03 10*6/uL (ref 3.70–5.45)
RDW: 18.2 % — AB (ref 11.2–14.5)
WBC: 15.3 10*3/uL — ABNORMAL HIGH (ref 3.9–10.3)
lymph#: 1.3 10*3/uL (ref 0.9–3.3)

## 2013-08-23 MED ORDER — SODIUM CHLORIDE 0.9 % IV SOLN
Freq: Once | INTRAVENOUS | Status: AC
  Administered 2013-08-23: 15:00:00 via INTRAVENOUS

## 2013-08-23 MED ORDER — DEXAMETHASONE SODIUM PHOSPHATE 20 MG/5ML IJ SOLN
INTRAMUSCULAR | Status: AC
  Filled 2013-08-23: qty 5

## 2013-08-23 MED ORDER — SODIUM CHLORIDE 0.9 % IJ SOLN
10.0000 mL | INTRAMUSCULAR | Status: DC | PRN
  Administered 2013-08-23: 10 mL
  Filled 2013-08-23: qty 10

## 2013-08-23 MED ORDER — SODIUM CHLORIDE 0.9 % IV SOLN
500.0000 mg/m2 | Freq: Once | INTRAVENOUS | Status: AC
  Administered 2013-08-23: 1020 mg via INTRAVENOUS
  Filled 2013-08-23: qty 51

## 2013-08-23 MED ORDER — HEPARIN SOD (PORK) LOCK FLUSH 100 UNIT/ML IV SOLN
500.0000 [IU] | Freq: Once | INTRAVENOUS | Status: AC | PRN
  Administered 2013-08-23: 500 [IU]
  Filled 2013-08-23: qty 5

## 2013-08-23 MED ORDER — DEXAMETHASONE SODIUM PHOSPHATE 20 MG/5ML IJ SOLN
12.0000 mg | Freq: Once | INTRAMUSCULAR | Status: AC
  Administered 2013-08-23: 12 mg via INTRAVENOUS

## 2013-08-23 MED ORDER — PALONOSETRON HCL INJECTION 0.25 MG/5ML
INTRAVENOUS | Status: AC
  Filled 2013-08-23: qty 5

## 2013-08-23 MED ORDER — DOXORUBICIN HCL CHEMO IV INJECTION 2 MG/ML
50.0000 mg/m2 | Freq: Once | INTRAVENOUS | Status: AC
  Administered 2013-08-23: 102 mg via INTRAVENOUS
  Filled 2013-08-23: qty 51

## 2013-08-23 MED ORDER — DOCETAXEL CHEMO INJECTION 160 MG/16ML
75.0000 mg/m2 | Freq: Once | INTRAVENOUS | Status: AC
  Administered 2013-08-23: 150 mg via INTRAVENOUS
  Filled 2013-08-23: qty 15

## 2013-08-23 MED ORDER — PALONOSETRON HCL INJECTION 0.25 MG/5ML
0.2500 mg | Freq: Once | INTRAVENOUS | Status: AC
  Administered 2013-08-23: 0.25 mg via INTRAVENOUS

## 2013-08-23 MED ORDER — SODIUM CHLORIDE 0.9 % IV SOLN
150.0000 mg | Freq: Once | INTRAVENOUS | Status: AC
  Administered 2013-08-23: 150 mg via INTRAVENOUS
  Filled 2013-08-23: qty 5

## 2013-08-23 NOTE — Progress Notes (Signed)
ID: [name] OB: Mar 22, 1951  MR#: [id number]  CSN#:[id number]  PCP: Ron Parker, MD GYN:  Lahoma Crocker, MD SU: Fanny Skates, MD OTHER MD: Thea Silversmith, MD   CURRENT THERAPY: Neodjuvant chemotherapy DOCETAXEL/ADRIAMYCIN/CYTOXAN (TAC)  CHIEF COMPLAINT: Right breast cancer/neoadjuvant chemotherapy  HISTORY OF PRESENT ILLNESS: As per the previously documented note: Denise Becker palpated a change in her right breast sometime in August or September 2014. She was aware that this might be significant, but waited on meds until she saw her gynecologist for a routine visit. Dr. Delsa Sale immediately he set her up for right mammography and ultrasonography performed 03/03/2013. Mammography found an area of density in the upper outer quadrant of the right breast measuring 5 cm in the with right nipple retraction. There was no evidence of skin thickening. On exam there was a firm palpable mass in the superior right breast extending from 1:00 to 9:00. The right nipple was completely retracted. There was no skin thickening. The right axilla was benign by palpation. Ultrasound showed a prominent irregular hypoechoic mass larger than the ultrasound screen. Ultrasound of the right axilla showed 2 adjacent small lymph nodes with diffusely thickened cortices, the largest measuring 1.1 cm.  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 There was no HER-2 amplification with a ratio of 1.46 by CISH and an average copy number of 2.85.  On 03/11/2013 the patient underwent bilateral breast MRI. This showed an irregular enhancing mass in the central to upper right breast with nipple retraction measuring  in total 6.8 cm. There were several adjacent enhancing nodules as well. There was no involvement of the pectoralis muscle. There were no definite morphologically abnormal right axillary lymph nodes there was no definite internal mammary or lymphadenopathy. Left breast was unremarkable.  The patient's subsequent history is as detailed below   INTERVAL HISTORY: Denise Becker returns alone today for followup of her locally advanced right breast cancer. She continues to receive neoadjuvant chemotherapy, receiving every 3 week docetaxel/doxorubicin/cyclophosphamide.  She is due today for her sixth and final planned cycle.   She receives Neulasta on day 2 for granulocyte support.  Denise Becker continues to tolerate treatment well and is excited to be completing her neoadjuvant chemotherapy today. Her biggest complaint is the swelling in her feet and ankles which continues. It does decrease with elevation. She is drinking plenty of water, and is trying to watch her sodium intake. The only time she notices signs of peripheral neuropathy is when she has significant swelling in the feet, which leads to some subsequent tingling. Otherwise, she's had no signs of neuropathy elsewhere.  Denise Becker's only other concern today is some firmness and fullness she feels in her abdomen. She feels like this has increased, although she denies any actual pain.  REVIEW OF SYSTEMS: Denise Becker has had no fevers, chills, night sweats, or hot flashes. Her energy level is fair, and she does have some mild fatigue. She denies any significant skin changes and has had no new rashes. Her nails are darkened, but she's had no pain in the nailbeds, and no drainage to indicate an infection. She denies any abnormal bruising or bleeding. She's had no mouth ulcers, oral sensitivity, or problems swallowing. Her appetite is fairly good. She denies any nausea and  has had no change in bowel or bladder habits. She denies any cough, phlegm production, increased  shortness of breath, orthopnea, chest pain, palpitations. She does feel like she is more deconditioned. She is still walking on a regular basis, but notes that she cannot walk for as long or as far she typically would. She's had no abnormal headaches, change in vision, or dizziness. She currently denies any new or unusual myalgias, arthralgias, or bony pain.   A detailed review of systems is otherwise stable and noncontributory.    PAST MEDICAL HISTORY: Past Medical History  Diagnosis Date  . Anxiety   . Depression     history of depression after son died  . Breast cancer of upper-outer quadrant of right female breast 03/10/2013  . Hypertension     PAST SURGICAL HISTORY: Past Surgical History  Procedure Laterality Date  . Abdominal hysterectomy    . Multiple tooth extractions Bilateral   . Tonsillectomy    . Cesarean section    . Portacath placement Right 04/15/2013    Procedure: INSERTION PORT-A-CATH;  Surgeon: Adin Hector, MD;  Location: [facility];  Service: General;  Laterality: Right;  . Port a cath revision Right 04/29/2013    Procedure: PORT A CATH REVISION;  Surgeon: Adin Hector, MD;  Location: [facility];  Service: General;  Laterality: Right;    FAMILY HISTORY Family History  Problem Relation Age of Onset  . Cancer Mother   . Hypertension Mother   . Bowel Disease Mother   . Lung disease Father   . Heart disease Maternal Grandmother   . Heart disease Maternal Grandfather    the patient's mother,Denise Becker, had a lung mass but apparently died from unrelated causes (she was followed by Dr. Earlie Server here). She was 64 years old. The patient has little information about her father. The patient had 2 brothers, no sisters. There is no history of breast or ovarian cancer in the family to her knowledge.   GYNECOLOGIC HISTORY:   (Reviewed 08/23/2013) Menarche age 56, first live birth age 33, the patient is GX P1. She went through  the change of life approximately 1999. She did not take hormone replacement.  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 She is on disability throughout her treatment. She is single, lives alone with her [place]. The patient's son Denise Becker died at the age of 62 with acute leukemia. The patient has no grandchildren.    ADVANCED DIRECTIVES: Not in place, but the patient was given the appropriate documents to complete and notarize on her 03/16/2013 visit. She intends to name her cousin, Denise Becker, as her healthcare power of attorney. She can be reached at [place]:  (Updated 08/23/2013) History  Substance Use Topics  . Smoking status: Former Smoker -- 1.00 packs/day    Types: Cigarettes    Quit date: 04/12/2001  . Smokeless tobacco: Never Used  . Alcohol Use: Yes     Comment: occasional wine     Colonoscopy: Not on file  PAP: UTD/Dr. Delsa Sale  Bone density:  Never  Lipid panel:  Dec 2014/Dr. Jackson-Moore   Allergies  Allergen Reactions  . Ciprofloxacin     Patient developed severe diarrhea on cipro    Current Outpatient Prescriptions  Medication Sig Dispense Refill  . amLODipine-olmesartan (AZOR) 10-40 MG per tablet Take 1 tablet by mouth every morning.      Marland Kitchen  dexamethasone (DECADRON) 4 MG tablet 2 tabs by mouth twice daily with food on day before and 3 days after each chemo dose  30 tablet  2  . hydrocortisone 2.5 % cream Apply 1 application topically 2 (two) times daily.      Marland Kitchen lidocaine-prilocaine (EMLA) cream Apply 1 application topically daily as needed. Pain port access 1-2 hours before chemo      . Loratadine (CLARITIN PO) Take 1 tablet by mouth.      Marland Kitchen LORazepam (ATIVAN) 0.5 MG tablet Take 0.5 mg by mouth. Take 1 -2 tabs at night as needed for sleep      . Multiple Vitamin (MULTIVITAMIN) capsule Take 1 capsule by mouth daily.      Marland Kitchen omeprazole  (PRILOSEC) 20 MG capsule Take 1 capsule (20 mg total) by mouth daily.  30 capsule  2  . tobramycin-dexamethasone (TOBRADEX) ophthalmic solution Place 1 drop into both eyes 2 (two) times daily.  5 mL  1  . HYDROcodone-acetaminophen (NORCO/VICODIN) 5-325 MG per tablet Take 1-2 tablets by mouth every 6 (six) hours as needed for moderate pain.      . naproxen sodium (ANAPROX) 220 MG tablet Take 220 mg by mouth 2 (two) times daily as needed.      . ondansetron (ZOFRAN) 8 MG tablet Take 8 mg by mouth 2 (two) times daily as needed for nausea or vomiting.      . Potassium Chloride in NaCl (0.9 % NACL WITH KCL 20 MEQ / L) 20-0.9 MEQ/L-% Please bring patient in for 20 meq potassium chloride in 500 ml 0.9% Normal saline.  Have patient return to [facility] after completion of IVF.  500 mL  ml  . prochlorperazine (COMPAZINE) 10 MG tablet Take 10 mg by mouth every 6 (six) hours as needed for nausea or vomiting. For 3 days after chemo       No current facility-administered medications for this visit.   Facility-Administered Medications Ordered in Other Visits  Medication Dose Route Frequency Provider Last Rate Last Dose  . 0.9 %  sodium chloride infusion   Intravenous Once Christobal Morado G Alleah Dearman, PA-C      . cyclophosphamide (CYTOXAN) 1,020 mg in sodium chloride 0.9 % 250 mL chemo infusion  500 mg/m2 (Treatment Plan Actual) Intravenous Once Eulamae Greenstein G Makhi Muzquiz, PA-C      . DOCEtaxel (TAXOTERE) 150 mg in dextrose 5 % 250 mL chemo infusion  75 mg/m2 (Treatment Plan Actual) Intravenous Once Theotis Burrow, PA-C 265 mL/hr at 08/23/13 1536 150 mg at 08/23/13 1536  . heparin lock flush 100 unit/mL  500 Units Intracatheter Once PRN Jered Heiny G Jorgia Manthei, PA-C      . sodium chloride 0.9 % injection 10 mL  10 mL Intracatheter PRN Dahna Hattabaugh Milda Smart, PA-C        OBJECTIVE: Middle-aged [place] American woman who appears comfortable  Filed Vitals:   08/23/13 1315  BP: 133/80  Pulse: 88  Temp: 97.7 F (36.5 C)  Resp: 18     Body mass index is 42.69 kg/(m^2).     ECOG FS: 1 Filed Weights   08/23/13 1315  Weight: 225 lb 12.8 oz (102.422 kg)   Physical Exam: HEENT:  Sclerae anicteric.  Oropharynx pink and moist. Poor dentition. No visible ulcerations. No evidence of mucositis or oropharyngeal candidiasis. Neck supple, trachea midline.   NODES:  No cervical or supraclavicular lymphadenopathy palpated.  BREAST EXAM:  Deferred. Axillae are benign bilaterally with no palpable lymphadenopathy.  LUNGS:  Clear to auscultation  bilaterally with good excursion.  No  wheezes or rhonchi HEART:  Regular rate and rhythm. No murmur.  ABDOMEN:  Soft, obese, nontender. The abdomen is firm to palpation.  No hepatomegaly or masses palpated.  Positive bowel sounds.  MSK:  No focal spinal tenderness to palpation.   Good range of motion bilaterally in the upper extremities. EXTREMITIES:   1+ pitting edema bilaterally in the lower extremities, equal bilaterally. No erythema or palpable cords noted. No swelling noted in the upper extremities.   SKIN: Hyperpigmentation bilaterally in the hands and nailbeds.  No  additional skin changes are noted, and no visible rashes. No excessive ecchymoses. No petechiae. Port is intact in the right upper chest wall with no erythema, edema, or evidence of infection/cellulitis. NEURO:  Nonfocal. Well oriented.  Appropriate affect.    LAB RESULTS:   Lab Results  Component Value Date   WBC 15.3* 08/23/2013   NEUTROABS 11.6* 08/23/2013   HGB 11.3* 08/23/2013   HCT 35.7 08/23/2013   MCV 88.5 08/23/2013   PLT 440* 08/23/2013      Chemistry      Component Value Date/Time   NA 144 08/23/2013 1304   NA 141 04/04/2013 1031   K 3.5 08/23/2013 1304   K 3.6* 04/04/2013 1031   CL 103 04/04/2013 1031   CO2 23 08/23/2013 1304   CO2 27 04/04/2013 1031   BUN 13.4 08/23/2013 1304   BUN 16 04/04/2013 1031   CREATININE 0.8 08/23/2013 1304   CREATININE 0.81 04/04/2013 1031   CREATININE 0.75 03/02/2013 1135      Component Value Date/Time   CALCIUM 9.9 08/23/2013  1304   CALCIUM 9.3 04/04/2013 1031   ALKPHOS 71 08/23/2013 1304   ALKPHOS 74 04/04/2013 1031   AST 13 08/23/2013 1304   AST 16 04/04/2013 1031   ALT 22 08/23/2013 1304   ALT 23 04/04/2013 1031   BILITOT 0.27 08/23/2013 1304   BILITOT 0.3 04/04/2013 1031       STUDIES:  Echocardiogram on 03/29/2013 showed an ejection fraction of 55-60%.  Mr Breast Bilateral W Wo Contrast 07/11/2013   CLINICAL DATA:  63 year old female recently diagnosed with grade 3 invasive mammary carcinoma in the right breast. The patient is receiving neoadjuvant chemotherapy. A biopsied right axillary lymph node was negative.  LABS:  None.  EXAM: BILATERAL BREAST MRI WITH AND WITHOUT CONTRAST  TECHNIQUE: Multiplanar, multisequence MR images of both breasts were obtained prior to and following the intravenous administration of 81m of MultiHance.  THREE-DIMENSIONAL MR IMAGE RENDERING ON INDEPENDENT WORKSTATION:  Three-dimensional MR images were rendered by post-processing of the original MR data on an independent workstation. The three-dimensional MR images were interpreted, and findings are reported in the following complete MRI report for this study. Three dimensional images were evaluated at the independent DynaCad workstation  COMPARISON:  Mammograms dated 03/04/2013 and 03/03/2013. Prior MRI stated 03/11/2013.  FINDINGS: Breast composition: c:  Heterogeneous fibroglandular tissue  Background parenchymal enhancement: Moderate  Right breast: Irregular enhancing mass in the central and upper outer quadrant of the right breast extending to and retracting the nipple measures 2.8 cm AP, 4.8 cm transverse and 3.1 cm cranial caudal. On the prior MRI dated 03/11/2013 the mass measured 4.1 cm AP, 6.8 cm transverse and 4.9 cm cranial caudal.  Left breast: No mass or abnormal enhancement.  Lymph nodes: No abnormal appearing lymph nodes.  Ancillary findings:  A right sided Port-A-Cath has been placed.  IMPRESSION: Interval reduction in the known  invasive  mammary carcinoma in the right breast, as described above.  RECOMMENDATION: Continue treatment planning is recommended.  BI-RADS CATEGORY  6: Known biopsy-proven malignancy - appropriate action should be taken.   Electronically Signed   By: Lillia Mountain M.D.   On: 07/11/2013 09:27     ASSESSMENT: 63 y.o. [place] woman   (1)  status post right breast biopsy 03/04/2013 for a clinical T3 N1, stage IIIA invasive ductal carcinoma, grade 3, estrogen receptor 100% positive, progesterone receptor 100% positive, with an MIB-1 of 20% and no HER-2 amplification.  (2) right axillary lymph node biopsy 03/04/2013 was benign   (3) status post Port-A-Cath remission on 04/29/2013  (4) Neoadjuvant chemotherapy with docetaxel doxorubicin and cyclophosphamide - start date 05/03/2013, to be repeated once every 3 weeks for 6 cycles prior to definitive surgery.   (5) Neulasta on day 2 of each cycle for granulocyte support  (6)  patient will likely need a mastectomy following neoadjuvant chemotherapy.  (7) GERD -  controlled with omeprazole  (8) history of right shoulder dislocation with intermittent right shoulder pain - controlled with Aleve  (9)  swelling in the lower extremities, stable; improves with elevation (Doppler study of the right lower extremity was negative for a DVT on 06/02/2013.)   PLAN: Aiyonna continues to tolerate her neoadjuvant chemotherapy well, and will proceed for chemotherapy today as scheduled, her sixth and final dose of neoadjuvant docetaxel/doxorubicin/cyclophosphamide. She'll have her Neulasta injection tomorrow for granulocyte support. She scheduled for repeat breast MRI next Monday, June 1, to assess response to therapy, and we'll see Dr. Jana Hakim the following day to review those results. She's already scheduled to see Dr. Dalbert Batman later in June to discuss her definitive surgery.  With Kathlean's concerned about her abdomen, we did go back today and review the results of  her PET scan from December 2014. While there were no findings for metastatic disease, there was an incidental finding of a" large anterior abdominal wall hernia containing colon small bowel."  I wonder if perhaps this is what Emaley is feeling. We will order a CT of the abdomen/pelvis for further evaluation, and she can also address this with her surgeon.  All of the above is reviewed in detail with the patient today. She voices her understanding and agreement with this plan. She knows to call with any changes or problems.  Theotis Burrow, PA-C  Medical Oncology   08/23/2013 4:12 PM

## 2013-08-24 ENCOUNTER — Telehealth: Payer: Self-pay | Admitting: Oncology

## 2013-08-24 ENCOUNTER — Ambulatory Visit (HOSPITAL_BASED_OUTPATIENT_CLINIC_OR_DEPARTMENT_OTHER): Payer: 59

## 2013-08-24 ENCOUNTER — Other Ambulatory Visit: Payer: Self-pay | Admitting: Oncology

## 2013-08-24 ENCOUNTER — Other Ambulatory Visit: Payer: Self-pay

## 2013-08-24 ENCOUNTER — Other Ambulatory Visit: Payer: Self-pay | Admitting: Physician Assistant

## 2013-08-24 VITALS — BP 153/69 | HR 83 | Temp 98.0°F | Resp 16

## 2013-08-24 DIAGNOSIS — Z5189 Encounter for other specified aftercare: Secondary | ICD-10-CM

## 2013-08-24 DIAGNOSIS — C50411 Malignant neoplasm of upper-outer quadrant of right female breast: Secondary | ICD-10-CM

## 2013-08-24 DIAGNOSIS — C50419 Malignant neoplasm of upper-outer quadrant of unspecified female breast: Secondary | ICD-10-CM

## 2013-08-24 MED ORDER — PEGFILGRASTIM INJECTION 6 MG/0.6ML
6.0000 mg | Freq: Once | SUBCUTANEOUS | Status: AC
  Administered 2013-08-24: 6 mg via SUBCUTANEOUS
  Filled 2013-08-24: qty 0.6

## 2013-08-24 NOTE — Telephone Encounter (Signed)
WL to sch CT Pelvis/ABD-per Vickii Chafe will call to sch w/pt-stated it will be sch this week-adv iwood note the acct

## 2013-08-24 NOTE — Patient Instructions (Signed)

## 2013-08-26 ENCOUNTER — Encounter (HOSPITAL_COMMUNITY): Payer: Self-pay

## 2013-08-26 ENCOUNTER — Ambulatory Visit (HOSPITAL_COMMUNITY)
Admission: RE | Admit: 2013-08-26 | Discharge: 2013-08-26 | Disposition: A | Discharge status: Home / Self Care | Payer: 59 | Source: Ambulatory Visit | Attending: Physician Assistant | Admitting: Physician Assistant

## 2013-08-26 DIAGNOSIS — R141 Gas pain: Secondary | ICD-10-CM | POA: Insufficient documentation

## 2013-08-26 DIAGNOSIS — K429 Umbilical hernia without obstruction or gangrene: Secondary | ICD-10-CM | POA: Insufficient documentation

## 2013-08-26 DIAGNOSIS — R142 Eructation: Secondary | ICD-10-CM

## 2013-08-26 DIAGNOSIS — K439 Ventral hernia without obstruction or gangrene: Secondary | ICD-10-CM | POA: Insufficient documentation

## 2013-08-26 DIAGNOSIS — M479 Spondylosis, unspecified: Secondary | ICD-10-CM | POA: Insufficient documentation

## 2013-08-26 DIAGNOSIS — R143 Flatulence: Secondary | ICD-10-CM

## 2013-08-26 DIAGNOSIS — C50411 Malignant neoplasm of upper-outer quadrant of right female breast: Secondary | ICD-10-CM

## 2013-08-26 DIAGNOSIS — C50919 Malignant neoplasm of unspecified site of unspecified female breast: Secondary | ICD-10-CM | POA: Insufficient documentation

## 2013-08-26 MED ORDER — IOHEXOL 300 MG/ML  SOLN
100.0000 mL | Freq: Once | INTRAMUSCULAR | Status: AC | PRN
  Administered 2013-08-26: 100 mL via INTRAVENOUS

## 2013-08-29 ENCOUNTER — Ambulatory Visit
Admission: RE | Admit: 2013-08-29 | Discharge: 2013-08-29 | Disposition: A | Discharge status: Home / Self Care | Payer: 59 | Source: Ambulatory Visit | Attending: Physician Assistant | Admitting: Physician Assistant

## 2013-08-29 DIAGNOSIS — C50411 Malignant neoplasm of upper-outer quadrant of right female breast: Secondary | ICD-10-CM

## 2013-08-30 ENCOUNTER — Telehealth: Payer: Self-pay | Admitting: Oncology

## 2013-08-30 ENCOUNTER — Ambulatory Visit (HOSPITAL_BASED_OUTPATIENT_CLINIC_OR_DEPARTMENT_OTHER): Payer: 59 | Admitting: Oncology

## 2013-08-30 ENCOUNTER — Other Ambulatory Visit (HOSPITAL_BASED_OUTPATIENT_CLINIC_OR_DEPARTMENT_OTHER): Payer: 59

## 2013-08-30 VITALS — BP 119/84 | HR 97 | Temp 98.4°F | Resp 18 | Ht 61.0 in | Wt 219.9 lb

## 2013-08-30 DIAGNOSIS — C50411 Malignant neoplasm of upper-outer quadrant of right female breast: Secondary | ICD-10-CM

## 2013-08-30 DIAGNOSIS — R609 Edema, unspecified: Secondary | ICD-10-CM

## 2013-08-30 DIAGNOSIS — C50419 Malignant neoplasm of upper-outer quadrant of unspecified female breast: Secondary | ICD-10-CM

## 2013-08-30 LAB — CBC WITH DIFFERENTIAL/PLATELET
BASO%: 0.2 % (ref 0.0–2.0)
Basophils Absolute: 0 10*3/uL (ref 0.0–0.1)
EOS ABS: 0 10*3/uL (ref 0.0–0.5)
EOS%: 1.3 % (ref 0.0–7.0)
HCT: 35 % (ref 34.8–46.6)
HGB: 11.2 g/dL — ABNORMAL LOW (ref 11.6–15.9)
LYMPH%: 52.9 % — AB (ref 14.0–49.7)
MCH: 28.4 pg (ref 25.1–34.0)
MCHC: 32 g/dL (ref 31.5–36.0)
MCV: 88.7 fL (ref 79.5–101.0)
MONO#: 0.4 10*3/uL (ref 0.1–0.9)
MONO%: 27.8 % — ABNORMAL HIGH (ref 0.0–14.0)
NEUT%: 17.8 % — ABNORMAL LOW (ref 38.4–76.8)
NEUTROS ABS: 0.2 10*3/uL — AB (ref 1.5–6.5)
Platelets: 146 10*3/uL (ref 145–400)
RBC: 3.95 10*6/uL (ref 3.70–5.45)
RDW: 17.3 % — AB (ref 11.2–14.5)
WBC: 1.4 10*3/uL — ABNORMAL LOW (ref 3.9–10.3)
lymph#: 0.7 10*3/uL — ABNORMAL LOW (ref 0.9–3.3)

## 2013-08-30 LAB — COMPREHENSIVE METABOLIC PANEL (CC13)
ALBUMIN: 3.4 g/dL — AB (ref 3.5–5.0)
ALT: 12 U/L (ref 0–55)
ANION GAP: 14 meq/L — AB (ref 3–11)
AST: 13 U/L (ref 5–34)
Alkaline Phosphatase: 76 U/L (ref 40–150)
BUN: 11.2 mg/dL (ref 7.0–26.0)
CO2: 20 meq/L — AB (ref 22–29)
Calcium: 9.1 mg/dL (ref 8.4–10.4)
Chloride: 110 mEq/L — ABNORMAL HIGH (ref 98–109)
Creatinine: 0.8 mg/dL (ref 0.6–1.1)
GLUCOSE: 85 mg/dL (ref 70–140)
Potassium: 3.8 mEq/L (ref 3.5–5.1)
SODIUM: 144 meq/L (ref 136–145)
Total Bilirubin: 0.44 mg/dL (ref 0.20–1.20)
Total Protein: 6.8 g/dL (ref 6.4–8.3)

## 2013-08-30 NOTE — Progress Notes (Signed)
ID: [name] OB: [birth year]  MR#: [id number]  CSN#:[id number]  PCP: Ron Parker, MD GYN:  Lahoma Crocker, MD SU: Fanny Skates, MD OTHER MD: Thea Silversmith, MD   CURRENT THERAPY: Neodjuvant chemotherapy DOCETAXEL/ADRIAMYCIN/CYTOXAN (TAC)  CHIEF COMPLAINT: Right breast cancer/neoadjuvant chemotherapy  HISTORY OF PRESENT ILLNESS: As per the previously documented note: Denise Becker palpated a change in her right breast sometime in August or September 2014. She was aware that this might be significant, but waited on meds until she saw her gynecologist for a routine visit. Dr. Delsa Sale immediately he set her up for right mammography and ultrasonography performed 03/03/2013. Mammography found an area of density in the upper outer quadrant of the right breast measuring 5 cm in the with right nipple retraction. There was no evidence of skin thickening. On exam there was a firm palpable mass in the superior right breast extending from 1:00 to 9:00. The right nipple was completely retracted. There was no skin thickening. The right axilla was benign by palpation. Ultrasound showed a prominent irregular hypoechoic mass larger than the ultrasound screen. Ultrasound of the right axilla showed 2 adjacent small lymph nodes with diffusely thickened cortices, the largest measuring 1.1 cm.  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 There was no HER-2 amplification with a ratio of 1.46 by C[facility] and an average copy number of 2.85.  On 03/11/2013 the patient underwent bilateral breast MRI. This showed an irregular enhancing mass in the central to upper right breast with nipple retraction measuring  in total 6.8 cm. There were several adjacent enhancing nodules as well. There was no involvement of the pectoralis muscle. There were no definite morphologically abnormal right axillary lymph nodes there was no definite internal mammary or lymphadenopathy. Left breast was unremarkable.  The patient's subsequent history is as detailed below   INTERVAL HISTORY: Denise Becker returns alone today for followup of her locally advanced right breast cancer. She continues to receive neoadjuvant chemotherapy, receiving every 3 week docetaxel/doxorubicin/cyclophosphamide.  She is due today for her sixth and final planned cycle.   She receives Neulasta on day 2 for granulocyte support.  Denise Becker continues to tolerate treatment well and is excited to be completing her neoadjuvant chemotherapy today. Her biggest complaint is the swelling in her feet and ankles which continues. It does decrease with elevation. She is drinking plenty of water, and is trying to watch her sodium intake. The only time she notices signs of peripheral neuropathy is when she has significant swelling in the feet, which leads to some subsequent tingling. Otherwise, she's had no signs of neuropathy elsewhere.  Denise Becker's only other concern today is some firmness and fullness she feels in her abdomen. She feels like this has increased, although she denies any actual pain.  REVIEW OF SYSTEMS: Denise Becker has had no fevers, chills, night sweats, or hot flashes. Her energy level is fair, and she does have some mild fatigue. She denies any significant skin changes and has had no new rashes. Her nails are darkened, but she's had no pain in the nailbeds, and no drainage to indicate an infection. She denies any abnormal bruising or bleeding. She's had no mouth ulcers, oral sensitivity, or problems swallowing. Her appetite is fairly good. She denies any nausea and  has had no change in bowel or bladder habits. She denies any cough, phlegm production, increased  shortness of breath, orthopnea, chest pain, palpitations. She does feel like she is more deconditioned. She is still walking on a regular basis, but notes that she cannot walk for as long or as far she typically would. She's had no abnormal headaches, change in vision, or dizziness. She currently denies any new or unusual myalgias, arthralgias, or bony pain.   A detailed review of systems is otherwise stable and noncontributory.    PAST MEDICAL HISTORY: Past Medical History  Diagnosis Date  . Anxiety   . Depression     history of depression after son died  . Breast cancer of upper-outer quadrant of right female breast 03/10/2013  . Hypertension     PAST SURGICAL HISTORY: Past Surgical History  Procedure Laterality Date  . Abdominal hysterectomy    . Multiple tooth extractions Bilateral   . Tonsillectomy    . Cesarean section    . Portacath placement Right 04/15/2013    Procedure: INSERTION PORT-A-CATH;  Surgeon: Adin Hector, MD;  Location: [facility];  Service: General;  Laterality: Right;  . Port a cath revision Right 04/29/2013    Procedure: PORT A CATH REVISION;  Surgeon: Adin Hector, MD;  Location: [facility];  Service: General;  Laterality: Right;    FAMILY HISTORY Family History  Problem Relation Age of Onset  . Cancer Mother   . Hypertension Mother   . Bowel Disease Mother   . Lung disease Father   . Heart disease Maternal Grandmother   . Heart disease Maternal Grandfather    the patient's mother,Denise Becker, had a lung mass but apparently died from unrelated causes (she was followed by Dr. Earlie Server here). She was 64 years old. The patient has little information about her father. The patient had 2 brothers, no sisters. There is no history of breast or ovarian cancer in the family to her knowledge.   GYNECOLOGIC HISTORY:   (Reviewed 08/23/2013) Menarche age 56, first live birth age 33, the patient is GX P1. She went through  the change of life approximately 1999. She did not take hormone replacement.  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 She is on disability throughout her treatment. She is single, lives alone with her Denise Becker. The patient's son Denise Becker died at the age of 62 with acute leukemia. The patient has no grandchildren.    ADVANCED DIRECTIVES: Not in place, but the patient was given the appropriate documents to complete and notarize on her 03/16/2013 visit. She intends to name her cousin, Denise Becker, as her healthcare power of attorney. She can be reached at Denise Becker:  (Updated 08/23/2013) History  Substance Use Topics  . Smoking status: Former Smoker -- 1.00 packs/day    Types: Cigarettes    Quit date: 04/12/2001  . Smokeless tobacco: Never Used  . Alcohol Use: Yes     Comment: occasional wine     Colonoscopy: Not on file  PAP: UTD/Dr. Delsa Sale  Bone density:  Never  Lipid panel:  Dec 2014/Dr. Jackson-Moore   Allergies  Allergen Reactions  . Ciprofloxacin     Patient developed severe diarrhea on cipro    Current Outpatient Prescriptions  Medication Sig Dispense Refill  . amLODipine-olmesartan (AZOR) 10-40 MG per tablet Take 1 tablet by mouth every morning.      Marland Kitchen  dexamethasone (DECADRON) 4 MG tablet 2 tabs by mouth twice daily with food on day before and 3 days after each chemo dose  30 tablet  2  . HYDROcodone-acetaminophen (NORCO/VICODIN) 5-325 MG per tablet Take 1-2 tablets by mouth every 6 (six) hours as needed for moderate pain.      . hydrocortisone 2.5 % cream Apply 1 application topically 2 (two) times daily.      Marland Kitchen lidocaine-prilocaine (EMLA) cream Apply 1 application topically daily as needed. Pain port access 1-2 hours before chemo      . Loratadine (CLARITIN PO) Take 1 tablet by mouth.      Marland Kitchen LORazepam (ATIVAN) 0.5 MG tablet Take 0.5 mg by mouth. Take 1  -2 tabs at night as needed for sleep      . Multiple Vitamin (MULTIVITAMIN) capsule Take 1 capsule by mouth daily.      . naproxen sodium (ANAPROX) 220 MG tablet Take 220 mg by mouth 2 (two) times daily as needed.      Marland Kitchen omeprazole (PRILOSEC) 20 MG capsule Take 1 capsule (20 mg total) by mouth daily.  30 capsule  2  . ondansetron (ZOFRAN) 8 MG tablet Take 8 mg by mouth 2 (two) times daily as needed for nausea or vomiting.      . Potassium Chloride in NaCl (0.9 % NACL WITH KCL 20 MEQ / L) 20-0.9 MEQ/L-% Please bring patient in for 20 meq potassium chloride in 500 ml 0.9% Normal saline.  Have patient return to [facility] after completion of IVF.  500 mL  ml  . prochlorperazine (COMPAZINE) 10 MG tablet Take 10 mg by mouth every 6 (six) hours as needed for nausea or vomiting. For 3 days after chemo      . tobramycin-dexamethasone (TOBRADEX) ophthalmic solution Place 1 drop into both eyes 2 (two) times daily.  5 mL  1   No current facility-administered medications for this visit.    OBJECTIVE: Middle-aged [place] American woman who appears comfortable  Filed Vitals:   08/30/13 1037  BP: 119/84  Pulse: 97  Temp: 98.4 F (36.9 C)  Resp: 18     Body mass index is 41.57 kg/(m^2).    ECOG FS: 1 Filed Weights   08/30/13 1037  Weight: 219 lb 14.4 oz (99.746 kg)   Physical Exam: HEENT:  Sclerae anicteric.  Oropharynx pink and moist. Poor dentition. No visible ulcerations. No evidence of mucositis or oropharyngeal candidiasis. Neck supple, trachea midline.   NODES:  No cervical or supraclavicular lymphadenopathy palpated.  BREAST EXAM:  Deferred. Axillae are benign bilaterally with no palpable lymphadenopathy.  LUNGS:  Clear to auscultation bilaterally with good excursion.  No  wheezes or rhonchi HEART:  Regular rate and rhythm. No murmur.  ABDOMEN:  Soft, obese, nontender. The abdomen is firm to palpation.  No hepatomegaly or masses palpated.  Positive bowel sounds.  MSK:  No focal spinal tenderness to  palpation.   Good range of motion bilaterally in the upper extremities. EXTREMITIES:   1+ pitting edema bilaterally in the lower extremities, equal bilaterally. No erythema or palpable cords noted. No swelling noted in the upper extremities.   SKIN: Hyperpigmentation bilaterally in the hands and nailbeds.  No  additional skin changes are noted, and no visible rashes. No excessive ecchymoses. No petechiae. Port is intact in the right upper chest wall with no erythema, edema, or evidence of infection/cellulitis. NEURO:  Nonfocal. Well oriented.  Appropriate affect.    LAB RESULTS:   Lab Results  Component Value Date   WBC 1.4* 08/30/2013   NEUTROABS 0.2* 08/30/2013   HGB 11.2* 08/30/2013   HCT 35.0 08/30/2013   MCV 88.7 08/30/2013   PLT 146 08/30/2013      Chemistry      Component Value Date/Time   NA 144 08/23/2013 1304   NA 141 04/04/2013 1031   K 3.5 08/23/2013 1304   K 3.6* 04/04/2013 1031   CL 103 04/04/2013 1031   CO2 23 08/23/2013 1304   CO2 27 04/04/2013 1031   BUN 13.4 08/23/2013 1304   BUN 16 04/04/2013 1031   CREATININE 0.8 08/23/2013 1304   CREATININE 0.81 04/04/2013 1031   CREATININE 0.75 03/02/2013 1135      Component Value Date/Time   CALCIUM 9.9 08/23/2013 1304   CALCIUM 9.3 04/04/2013 1031   ALKPHOS 71 08/23/2013 1304   ALKPHOS 74 04/04/2013 1031   AST 13 08/23/2013 1304   AST 16 04/04/2013 1031   ALT 22 08/23/2013 1304   ALT 23 04/04/2013 1031   BILITOT 0.27 08/23/2013 1304   BILITOT 0.3 04/04/2013 1031       STUDIES:  Echocardiogram on 03/29/2013 showed an ejection fraction of 55-60%.  Mr Breast Bilateral W Wo Contrast 07/11/2013   CLINICAL DATA:  63 year old female recently diagnosed with grade 3 invasive mammary carcinoma in the right breast. The patient is receiving neoadjuvant chemotherapy. A biopsied right axillary lymph node was negative.  LABS:  None.  EXAM: BILATERAL BREAST MRI WITH AND WITHOUT CONTRAST  TECHNIQUE: Multiplanar, multisequence MR images of both breasts were obtained  prior to and following the intravenous administration of 70m of MultiHance.  THREE-DIMENSIONAL MR IMAGE RENDERING ON INDEPENDENT WORKSTATION:  Three-dimensional MR images were rendered by post-processing of the original MR data on an independent workstation. The three-dimensional MR images were interpreted, and findings are reported in the following complete MRI report for this study. Three dimensional images were evaluated at the independent DynaCad workstation  COMPARISON:  Mammograms dated 03/04/2013 and 03/03/2013. Prior MRI stated 03/11/2013.  FINDINGS: Breast composition: c:  Heterogeneous fibroglandular tissue  Background parenchymal enhancement: Moderate  Right breast: Irregular enhancing mass in the central and upper outer quadrant of the right breast extending to and retracting the nipple measures 2.8 cm AP, 4.8 cm transverse and 3.1 cm cranial caudal. On the prior MRI dated 03/11/2013 the mass measured 4.1 cm AP, 6.8 cm transverse and 4.9 cm cranial caudal.  Left breast: No mass or abnormal enhancement.  Lymph nodes: No abnormal appearing lymph nodes.  Ancillary findings:  A right sided Port-A-Cath has been placed.  IMPRESSION: Interval reduction in the known invasive mammary carcinoma in the right breast, as described above.  RECOMMENDATION: Continue treatment planning is recommended.  BI-RADS CATEGORY  6: Known biopsy-proven malignancy - appropriate action should be taken.   Electronically Signed   By: DLillia MountainM.D.   On: 07/11/2013 09:27     ASSESSMENT: 63y.o. [place] woman   (1)  status post right breast biopsy 03/04/2013 for a clinical T3 N1, stage IIIA invasive ductal carcinoma, grade 3, estrogen receptor 100% positive, progesterone receptor 100% positive, with an MIB-1 of 20% and no HER-2 amplification.  (2) right axillary lymph node biopsy 03/04/2013 was benign   (3) status post Port-A-Cath remission on 04/29/2013  (4) Neoadjuvant chemotherapy with docetaxel doxorubicin and  cyclophosphamide - start date 05/03/2013, to be repeated once every 3 weeks for 6 cycles prior to definitive surgery.   (5) Neulasta on day 2 of  each cycle for granulocyte support  (6)  patient will likely need a mastectomy following neoadjuvant chemotherapy.  (7) GERD -  controlled with omeprazole  (8) history of right shoulder dislocation with intermittent right shoulder pain - controlled with Aleve  (9)  swelling in the lower extremities, stable; improves with elevation (Doppler study of the right lower extremity was negative for a DVT on 06/02/2013.)   PLAN: Shiesha continues to tolerate her neoadjuvant chemotherapy well, and will proceed for chemotherapy today as scheduled, her sixth and final dose of neoadjuvant docetaxel/doxorubicin/cyclophosphamide. She'll have her Neulasta injection tomorrow for granulocyte support. She scheduled for repeat breast MRI next Monday, June 1, to assess response to therapy, and we'll see Dr. Jana Hakim the following day to review those results. She's already scheduled to see Dr. Dalbert Batman later in June to discuss her definitive surgery.  With Elnore's concerned about her abdomen, we did go back today and review the results of her PET scan from December 2014. While there were no findings for metastatic disease, there was an incidental finding of a" large anterior abdominal wall hernia containing colon small bowel."  I wonder if perhaps this is what Keiona is feeling. We will order a CT of the abdomen/pelvis for further evaluation, and she can also address this with her surgeon.  All of the above is reviewed in detail with the patient today. She voices her understanding and agreement with this plan. She knows to call with any changes or problems.  Chauncey Cruel, MD  Medical Oncology   08/30/2013 11:04 AM

## 2013-08-30 NOTE — Telephone Encounter (Signed)
per pof to sch appts-gave pt copy of appt

## 2013-09-04 NOTE — Progress Notes (Addendum)
ID: [name] OB: 1951-03-01  MR#: [id number]  CSN#:[id number]  PCP: Ron Parker, MD GYN:  Lahoma Crocker, MD SU: Fanny Skates, MD OTHER MD: Thea Silversmith, MD  NOTE: THIS IS THE CORRECT NOTE FOR THE 08/30/2013 VISIT  CHIEF COMPLAINT: Right breast cancer THERAPY: Neodjuvant chemotherapy   HISTORY OF PRESENT ILLNESS: From the initial intake note 03/16/2013:  Denise Becker palpated a change in her right breast sometime in August or September 2014. She was aware that this might be significant, but waited on meds until she saw her gynecologist for a routine visit. Dr. Delsa Sale immediately he set her up for right mammography and ultrasonography performed 03/03/2013. Mammography found an area of density in the upper outer quadrant of the right breast measuring 5 cm in the with right nipple retraction. There was no evidence of skin thickening. On exam there was a firm palpable mass in the superior right breast extending from 1:00 to 9:00. The right nipple was completely retracted. There was no skin thickening. The right axilla was benign by palpation. Ultrasound showed a prominent irregular hypoechoic mass larger than the ultrasound screen. Ultrasound of the right axilla showed 2 adjacent small lymph nodes with diffusely thickened cortices, the largest measuring 1.1 cm.  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 There was no HER-2 amplification with a ratio of 1.46 by CISH and an average copy number of 2.85.  On 03/11/2013 the patient underwent bilateral breast MRI. This showed an irregular enhancing mass in the central to upper right breast with nipple retraction measuring in total  6.8 cm. There were several adjacent enhancing nodules as well. There was no involvement of the pectoralis muscle. There were no definite morphologically abnormal right axillary lymph nodes there was no definite internal mammary or lymphadenopathy. Left breast was unremarkable.  The patient's subsequent history is as detailed below   INTERVAL HISTORY: Denise Becker returns today for followup of her locally advanced right breast cancer. Today is day 8 cycle 6 of 6 planned cycles of "TAC" chemotherapy given every 3 weeks with Neulasta support. Since her last visit here the patient has noted an enlarging umbilical hernia, and a CT scan of the abdomen and pelvis was obtained 08/26/2013 showing no evidence of metastatic disease, but a large midline ventral hernia containing small bowel and colon. There were small bilateral inguinal hernias which contained only fat  REVIEW OF SYSTEMS: Denise Becker did remarkably well with her chemotherapy, and particularly there were no dose reductions or treatment delays. She never had significant problems with nausea or vomiting. She does feel anxious, and "I'm just not with that today". With regards to the abdominal hernia, there have been no change in bowel habits, no cramps, no blood in stool, and no pain. A detailed review of systems today was otherwise noncontributory  PAST MEDICAL HISTORY: Past Medical History  Diagnosis Date  . Anxiety   . Depression     history of depression after son died  . Breast cancer of upper-outer quadrant of right female breast 03/10/2013  . Hypertension     PAST SURGICAL HISTORY: Past Surgical History  Procedure Laterality Date  . Abdominal hysterectomy    . Multiple tooth extractions Bilateral   . Tonsillectomy    . Cesarean  section    . Portacath placement Right 04/15/2013    Procedure: INSERTION PORT-A-CATH;  Surgeon: Adin Hector, MD;  Location: [place];  Service: General;  Laterality: Right;  . Port a cath  revision Right 04/29/2013    Procedure: PORT A CATH REVISION;  Surgeon: Adin Hector, MD;  Location: [place];  Service: General;  Laterality: Right;    FAMILY HISTORY Family History  Problem Relation Age of Onset  . Cancer Mother   . Hypertension Mother   . Bowel Disease Mother   . Lung disease Father   . Heart disease Maternal Grandmother   . Heart disease Maternal Grandfather   the patient's mother,Flowrene Millings, had a lung mass but apparently died from unrelated causes (she was followed by Dr. Earlie Server here). She was 64 years old. The patient has little information about her father. The patient had 2 brothers, no sisters. There is no history of breast or ovarian cancer in the family to her knowledge.   GYNECOLOGIC HISTORY:   (Reviewed 08/23/2013) Menarche age 27, first live birth age 34, the patient is GX P1. She went through the change of life approximately 1999. She did not take hormone replacement.  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 She is on disability throughout her treatment. She is single, lives alone with her [place]. The patient's son Helen Hashimoto died at the age of 42 with acute leukemia. The patient has no grandchildren.    ADVANCED DIRECTIVES: Not in place, but the patient was given the appropriate documents to complete and notarize on her 03/16/2013 visit. She intends to name her cousin, Sharrie Rothman, as her healthcare power of attorney. She can be reached at [place]:  (Updated 08/23/2013) History  Substance Use Topics  . Smoking status: Former Smoker -- 1.00 packs/day    Types: Cigarettes    Quit date: 04/12/2001  . Smokeless tobacco: Never Used  . Alcohol Use: Yes     Comment: occasional wine     Colonoscopy: Not on file  PAP: UTD/Dr. Delsa Sale  Bone density:  Never  Lipid panel:  Dec 2014/Dr.  Jackson-Moore   Allergies  Allergen Reactions  . Ciprofloxacin     Patient developed severe diarrhea on cipro    Current Outpatient Prescriptions  Medication Sig Dispense Refill  . amLODipine-olmesartan (AZOR) 10-40 MG per tablet Take 1 tablet by mouth every morning.      Marland Kitchen dexamethasone (DECADRON) 4 MG tablet 2 tabs by mouth twice daily with food on day before and 3 days after each chemo dose  30 tablet  2  . HYDROcodone-acetaminophen (NORCO/VICODIN) 5-325 MG per tablet Take 1-2 tablets by mouth every 6 (six) hours as needed for moderate pain.      . hydrocortisone 2.5 % cream Apply 1 application topically 2 (two) times daily.      Marland Kitchen lidocaine-prilocaine (EMLA) cream Apply 1 application topically daily as needed. Pain port access 1-2 hours before chemo      . Loratadine (CLARITIN PO) Take 1 tablet by mouth.      Marland Kitchen LORazepam (ATIVAN) 0.5 MG tablet Take 0.5 mg by mouth. Take 1 -2 tabs at night as needed for sleep      . Multiple Vitamin (MULTIVITAMIN) capsule Take 1 capsule by mouth daily.      . naproxen sodium (ANAPROX) 220 MG tablet Take 220 mg by mouth 2 (two)  times daily as needed.      Marland Kitchen omeprazole (PRILOSEC) 20 MG capsule Take 1 capsule (20 mg total) by mouth daily.  30 capsule  2  . ondansetron (ZOFRAN) 8 MG tablet Take 8 mg by mouth 2 (two) times daily as needed for nausea or vomiting.      . Potassium Chloride in NaCl (0.9 % NACL WITH KCL 20 MEQ / L) 20-0.9 MEQ/L-% Please bring patient in for 20 meq potassium chloride in 500 ml 0.9% Normal saline.  Have patient return to [facility] after completion of IVF.  500 mL  ml  . prochlorperazine (COMPAZINE) 10 MG tablet Take 10 mg by mouth every 6 (six) hours as needed for nausea or vomiting. For 3 days after chemo      . tobramycin-dexamethasone (TOBRADEX) ophthalmic solution Place 1 drop into both eyes 2 (two) times daily.  5 mL  1   No current facility-administered medications for this visit.   Objective: Middle-aged [place] American woman  who appears stated age 64 Vitals:   08/30/13 1037  BP: 119/84  Pulse: 97  Temp: 98.4 F (36.9 C)  Resp: 18   Body mass index is 41.57 kg/(m^2).  ECOG: 1  Sclerae unicteric, pupils equal and reactive Oropharynx clear and moist No cervical or supraclavicular adenopathy Lungs no rales or rhonchi Heart regular rate and rhythm Abd soft, obese, nontender, positive bowel sounds; very large ventral hernia, which is nontender, with no palpable masses. MSK no focal spinal tenderness, no upper extremity lymphedema Neuro: nonfocal, well oriented, appropriate affect Breasts:      LAB RESULTS:   Lab Results  Component Value Date   WBC 1.4* 08/30/2013   NEUTROABS 0.2* 08/30/2013   HGB 11.2* 08/30/2013   HCT 35.0 08/30/2013   MCV 88.7 08/30/2013   PLT 146 08/30/2013      Chemistry      Component Value Date/Time   NA 144 08/30/2013 1022   NA 141 04/04/2013 1031   K 3.8 08/30/2013 1022   K 3.6* 04/04/2013 1031   CL 103 04/04/2013 1031   CO2 20* 08/30/2013 1022   CO2 27 04/04/2013 1031   BUN 11.2 08/30/2013 1022   BUN 16 04/04/2013 1031   CREATININE 0.8 08/30/2013 1022   CREATININE 0.81 04/04/2013 1031   CREATININE 0.75 03/02/2013 1135      Component Value Date/Time   CALCIUM 9.1 08/30/2013 1022   CALCIUM 9.3 04/04/2013 1031   ALKPHOS 76 08/30/2013 1022   ALKPHOS 74 04/04/2013 1031   AST 13 08/30/2013 1022   AST 16 04/04/2013 1031   ALT 12 08/30/2013 1022   ALT 23 04/04/2013 1031   BILITOT 0.44 08/30/2013 1022   BILITOT 0.3 04/04/2013 1031       STUDIES: Ct Abdomen Pelvis W Contrast  08/26/2013   CLINICAL DATA:  Right breast cancer with ongoing chemotherapy. Enlarging umbilical hernia with increased abdominal fullness and distention.  EXAM: CT ABDOMEN AND PELVIS WITH CONTRAST  TECHNIQUE: Multidetector CT imaging of the abdomen and pelvis was performed using the standard protocol following bolus administration of intravenous contrast.  CONTRAST:  158m OMNIPAQUE IOHEXOL 300 MG/ML  SOLN  COMPARISON:  PET 03/30/2013.   FINDINGS: Lung bases show no acute findings. Heart is mildly enlarged. No pericardial or pleural effusion.  Liver, gallbladder, adrenal glands and right kidney are unremarkable. Low-attenuation lesions in the left kidney measure up to 10 mm and are too small to definitively characterize. Statistically, cysts are most likely. Spleen, pancreas and stomach  are unremarkable. There is a large midline ventral hernia measuring approximately 10.5 x 22.1 x 19.4 cm. The neck measures 3.9 x 4.4 cm. There is herniation of unobstructed small bowel and colon. Small bowel and colon are otherwise unremarkable.  Hysterectomy. Bladder is unremarkable. Bilateral inguinal hernias contain fat. Atherosclerotic calcification of the arterial vasculature without abdominal aortic aneurysm. No pathologically enlarged lymph nodes. No free fluid. No worrisome lytic or sclerotic lesions. Degenerative changes are seen in the spine.  IMPRESSION: 1. No evidence of metastatic disease in the abdomen or pelvis. 2. Large midline ventral hernia contains small bowel and colon. 3. Small bilateral inguinal hernias contain fat.   Electronically Signed   By: Lorin Picket M.D.   On: 08/26/2013 13:51    ASSESSMENT: 63 y.o. [place] woman   (1)  status post right breast biopsy 03/04/2013 for a clinical T3 N1, stage IIIA invasive ductal carcinoma, grade 3, estrogen receptor 100% positive, progesterone receptor 100% positive, with an MIB-1 of 20% and no HER-2 amplification.  (2) right axillary lymph node biopsy 03/04/2013 was negative  (3) neoadjuvant chemotherapy with docetaxel doxorubicin and cyclophosphamide x6 cycles completed 08/23/2013  (4) definitive surgery to follow adjuvant chemotherapy  (5) adjuvant radiation to follow surgery  (6) antiestrogen therapy to follow radiation  (7) large ventral hernia--surgical evaluation pending  PLAN: Denise Becker has completed her adjuvant chemotherapy and now is ready to consider surgery. Likely she  will require a mastectomy with sentinel lymph node dissection, but she will be discussing her options with Dr. Dalbert Batman at their next visit. Because of the size of the original tumor, even if she has a mastectomy she will need postmastectomy radiation. Once she completes her local treatment, we will start antiestrogen therapy, to continue for the next 5-10 years  Her port may be removed at her and her surgeons discretion. I don't have any simple solution for her large ventral hernia. Possibly a binder could be of help. She will discuss this with Dr. Dalbert Batman as well at their next visit.  I reviewed neutropenic precautions with the patient and she will call with any temperature, shaking chills, or other symptoms suggestive of infection.  The patient has a good understanding of the overall plan. She agrees with it. She knows the goal of treatment in her case is cure. She will call with any problems that may develop before her next visit here.  Chauncey Cruel, MD  Medical Oncology   09/04/2013 11:34 AM

## 2013-09-16 ENCOUNTER — Encounter (INDEPENDENT_AMBULATORY_CARE_PROVIDER_SITE_OTHER): Payer: Self-pay | Admitting: General Surgery

## 2013-09-16 ENCOUNTER — Ambulatory Visit (INDEPENDENT_AMBULATORY_CARE_PROVIDER_SITE_OTHER): Payer: 59 | Admitting: General Surgery

## 2013-09-16 VITALS — BP 124/74 | HR 91 | Temp 97.5°F | Ht 61.0 in | Wt 220.0 lb

## 2013-09-16 DIAGNOSIS — K439 Ventral hernia without obstruction or gangrene: Secondary | ICD-10-CM

## 2013-09-16 DIAGNOSIS — C50411 Malignant neoplasm of upper-outer quadrant of right female breast: Secondary | ICD-10-CM

## 2013-09-16 DIAGNOSIS — R600 Localized edema: Secondary | ICD-10-CM

## 2013-09-16 DIAGNOSIS — I10 Essential (primary) hypertension: Secondary | ICD-10-CM

## 2013-09-16 DIAGNOSIS — C50419 Malignant neoplasm of upper-outer quadrant of unspecified female breast: Secondary | ICD-10-CM

## 2013-09-16 DIAGNOSIS — R609 Edema, unspecified: Secondary | ICD-10-CM

## 2013-09-16 DIAGNOSIS — F4323 Adjustment disorder with mixed anxiety and depressed mood: Secondary | ICD-10-CM

## 2013-09-16 NOTE — Patient Instructions (Signed)
You have completed her chemotherapy, and it is time to proceed with definitive surgery.  Examination of the breast reveals that I can still feel the tumor, and it is still at least 4 cm in diameter.  As we discussed, you'll be scheduled for a right total mastectomy, right axillary sentinel node biopsy, and removal of the Port-A-Cath.  You will spend one night in the hospital and be able to go home the next day.  You will need postop radiation therapy, but that will not start for 5-6 weeks.  Nothing will be done about your abdominal wall hernia until much later, after you have completed all of your cancer treatments.       Mastectomy, With or Without Reconstruction Mastectomy (removal of the breast) is a procedure most commonly used to treat cancer (tumor) of the breast. Different procedures are available for treatment. This depends on the stage of the tumor (abnormal growths). Discuss this with your caregiver, surgeon (a specialist for performing operations such as this), or oncologist (someone specialized in the treatment of cancer). With proper information, you can decide which treatment is best for you. Although the sound of the word cancer is frightening to all of [name], the new treatments and medications can be a source of reassurance and comfort. If there are things you are worried about, discuss them with your caregiver. He or she can help comfort you and your family. Some of the different procedures for treating breast cancer are:  Radical (extensive) mastectomy. This is an operation used to remove the entire breast, the muscles under the breast, and all of the glands (lymph nodes) under the arm. With all of the new treatments available for cancer of the breast, this procedure has become less common.  Modified radical mastectomy. This is a similar operation to the radical mastectomy described above. In the modified radical mastectomy, the muscles of the chest wall are not removed unless  one of the lessor muscles is removed. One of the lessor muscles may be removed to allow better removal of the lymph nodes. The axillary lymph nodes are also removed. Rarely, during an axillary node dissection nerves to this area are damaged. Radiation therapy is then often used to the area following this surgery.  A total mastectomy also known as a complete or simple mastectomy. It involves removal of only the breast. The lymph nodes and the muscles are left in place.  In a lumpectomy, the lump is removed from the breast. This is the simplest form of surgical treatment. A sentinel lymph node biopsy may also be done. Additional treatment may be required. RISKS AND COMPLICATIONS The main problems that follow removal of the breast include:  Infection (germs start growing in the wound). This can usually be treated with antibiotics (medications that kill germs).  Lymphedema. This means the arm on the side of the breast that was operated on swells because the lymph (tissue fluid) cannot follow the main channels back into the body. This only occurs when the lymph nodes have had to be removed under the arm.  There may be some areas of numbness to the upper arm and around the incision (cut by the surgeon) in the breast. This happens because of the cutting of or damage to some of the nerves in the area. This is most often unavoidable.  There may be difficulty moving the arm in a full range of motion (moving in all directions) following surgery. This usually improves with time following use and exercise.  Recurrence of breast cancer may happen with the very best of surgery and follow up treatment. Sometimes small cancer cells that cannot be seen with the naked eye have already spread at the time of surgery. When this happens other treatment is available. This treatment may be radiation, medications or a combination of both. RECONSTRUCTION Reconstruction of the breast may be done immediately if there is not  going to be post-operative radiation. This surgery is done for cosmetic (improve appearance) purposes to improve the physical appearance after the operation. This may be done in two ways:  It can be done using a saline filled prosthetic (an artificial breast which is filled with salt water). Silicone breast implants are now re-approved by the FDA and are being commonly used.  Reconstruction can be done using the body's own muscle/fat/skin. Your caregiver will discuss your options with you. Depending upon your needs or choice, together you will be able to determine which procedure is best for you. Document Released: 12/10/2000 Document Revised: 12/10/2011 Document Reviewed: 08/03/2007 [facility] Patient Information 2015 [place], [place]. This information is not intended to replace advice given to you by your health care provider. Make sure you discuss any questions you have with your health care provider.

## 2013-09-16 NOTE — Progress Notes (Signed)
Patient ID: Denise Becker, female   DOB: 1951-01-03, 63 y.o.   MRN: [id number]   History:  Denise Becker returns to discuss definitive surgery for her right breast cancer. This patient was initially  diagnosed with locally advanced cancer of the right breast, central location. She presented In Dec., 2014 with obvious nipple retraction, a tumor at least 7 cm transversely and at least 5 cm vertically. There was no palpable adenopathy. Clinical stage was T3, N0, receptor positive, HER-2/neu negative.  A Port-A-Cath was inserted and functioned normally in the perioperative period but then it kinked and she had to be returned to the operating room to reposition the port and take the kink out of the catheter and it has since worked fine. She has received her first cycle of chemotherapy.  She has no complaints about the port  She has now completed her came therapy, and was referred back to me by Dr. Jana Hakim for definitive surgery and removal of the Port-A-Cath.  Mid treatment MRI was performed on 06/27/2013. It shows that the transverse dimensions of the tumor have gone down from 6.8 cm to 4.8 cm. There are no abnormal lymph nodes. There is no mass or enhancement in the left breast.  She thinks the tumor smaller states that she really does not examine her breasts.  No further imaging has been done.  Past history, family history, social history, and review of systems are all documented obstructive unchanged and noncontributory except as described above. She does have chronic lower extremity edema. .  Exam:  Patient looks good. She seems comfortable  Neck reveals no adenopathy or mass.  Lungs are clear to auscultation.  Port slight right infraclavicular area looks good. No hematoma or infection. Minimal tenderness  Right breast reveals soft skin. . Palpable mass in the central breast at least 4 cm in diameter. May be softer but still obviously palpable. Nipple is retracted but no obvious tumor in the  skin.No axillary mass.  Heart regular rate and rhythm. No murmur. No ectopy  Abdomen somewhat obese. Soft. Reducible hernia in the upper midline. Midline scar in lower abdomen.   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 This seems to have partially, but not completely responded to chemotherapy. She will need a mastectomy Status post Port-A-Cath insertion and subsequent revision   neoadjuvant chemotherapy Completed. Status post TAH and BSO  Reducible ventral hernia without obstruction Hypertension  Anxiety and depression    Plan:  Since she will require a mastectomy, she does not need an MRI.  We talked about her treatment plan at length. She is accepting of my recommendations. She'll be scheduled for right total mastectomy, right axillary SLN biopsy, and a removal of Port-A-Cath. She knows that she will need postop radiation therapy to the right chest wall.  She knows  she can consider delayed reconstruction down the road.  Current consensus at conference was that she would not benefit her to get axillary lymph node dissection.   I discussed the indications, details, techniques, and numerous risk of the surgery with her. She is aware of the risk of bleeding, infection, skin necrosis, or swelling, or numbness, cardiac, pulmonary, and thromboembolic problems. She understands these issues and all of her questions are answered. She agrees with this plan.     Edsel Petrin. Dalbert Batman, M.D., [facility]  General and Minimally invasive Surgery  Breast and  Colorectal Surgery  Office: [phone]  Pager: [phone]

## 2013-09-20 ENCOUNTER — Telehealth: Payer: Self-pay | Admitting: *Deleted

## 2013-09-20 MED ORDER — FUROSEMIDE 20 MG PO TABS: ORAL_TABLET | ORAL | Status: DC

## 2013-09-20 NOTE — Telephone Encounter (Signed)
This RN spoke with pt per her call stating concern due to bilateral tightness and swelling in feet and ankles.  Per discussion swelling is better in am but then increases during the day.  Noted pt denies any redness or skin breakdown.  Denise Becker concern is not just the swelling " but how tight they can feel and then it hurts to bend or flex my ankles"  Discussed above likely secondary to taxetere - which can cause late onset of swelling post completion of therapy.  Per review with AB/PA recommendation given for pt to institute lasix daily x 3 days then call the office with update and further instructions.  This RN called and discussed above with pt as well other interventions for swelling including elevation of foot of bed, need to maintain good hydration and monitor salt intake for low salt use.  Denise Becker verbalized understanding of above and will call this RN on Friday with update.

## 2013-10-13 ENCOUNTER — Encounter (HOSPITAL_COMMUNITY): Payer: Self-pay | Admitting: Pharmacy Technician

## 2013-10-15 NOTE — Pre-Procedure Instructions (Signed)
Denise Becker  10/15/2013   Your procedure is scheduled on:  July 28  Report to [facility] Admitting at 08:30 AM.  Call this number if you have problems the morning of surgery: [phone]   Remember:   Do not eat food or drink liquids after midnight.   Take these medicines the morning of surgery with A SIP OF WATER: Eye drops   STOP Multiple Vitamins and Naproxen today   STOP/ Do not take Aspirin, Aleve, Naproxen, Advil, Ibuprofen, Motrin, Vitamins, Herbs, or Supplements starting today   Do not wear jewelry, make-up or nail polish.  Do not wear lotions, powders, or perfumes. You may wear deodorant.  Do not shave 48 hours prior to surgery. Men may shave face and neck.  Do not bring valuables to the hospital.  [facility] is not responsible for any belongings or valuables.               Contacts, dentures or bridgework may not be worn into surgery.  Leave suitcase in the car. After surgery it may be brought to your room.  For patients admitted to the hospital, discharge time is determined by your treatment team.               Special Instructions: See [facility] Preparing For Surgery   Please read over the following fact sheets that you were given: Pain Booklet, Coughing and Deep Breathing and Surgical Site Infection Prevention

## 2013-10-17 ENCOUNTER — Encounter (HOSPITAL_COMMUNITY)
Admission: RE | Admit: 2013-10-17 | Discharge: 2013-10-17 | Disposition: A | Discharge status: Home / Self Care | Payer: 59 | Source: Ambulatory Visit | Attending: General Surgery | Admitting: General Surgery

## 2013-10-17 ENCOUNTER — Encounter (HOSPITAL_COMMUNITY): Payer: Self-pay

## 2013-10-17 DIAGNOSIS — Z01812 Encounter for preprocedural laboratory examination: Secondary | ICD-10-CM | POA: Insufficient documentation

## 2013-10-17 DIAGNOSIS — Z01818 Encounter for other preprocedural examination: Secondary | ICD-10-CM | POA: Insufficient documentation

## 2013-10-17 HISTORY — DX: Personal history of other diseases of the digestive system: Z87.19

## 2013-10-17 LAB — COMPREHENSIVE METABOLIC PANEL
ALT: 27 U/L (ref 0–35)
ANION GAP: 14 (ref 5–15)
AST: 21 U/L (ref 0–37)
Albumin: 3.7 g/dL (ref 3.5–5.2)
Alkaline Phosphatase: 102 U/L (ref 39–117)
BUN: 15 mg/dL (ref 6–23)
CO2: 23 mEq/L (ref 19–32)
Calcium: 9.6 mg/dL (ref 8.4–10.5)
Chloride: 104 mEq/L (ref 96–112)
Creatinine, Ser: 0.65 mg/dL (ref 0.50–1.10)
GFR calc Af Amer: >90 mL/min (ref >90)
GFR calc non Af Amer: >90 mL/min (ref >90)
Glucose, Bld: 85 mg/dL (ref 70–99)
POTASSIUM: 4.1 meq/L (ref 3.7–5.3)
Sodium: 141 mEq/L (ref 137–147)
Total Bilirubin: 0.3 mg/dL (ref 0.3–1.2)
Total Protein: 7.6 g/dL (ref 6.0–8.3)

## 2013-10-17 LAB — CBC WITH DIFFERENTIAL/PLATELET
BASOS ABS: 0.1 10*3/uL (ref 0.0–0.1)
BASOS PCT: 1 % (ref 0–1)
Eosinophils Absolute: 0.5 10*3/uL (ref 0.0–0.7)
Eosinophils Relative: 8 % — ABNORMAL HIGH (ref 0–5)
HCT: 42 % (ref 36.0–46.0)
Hemoglobin: 13.5 g/dL (ref 12.0–15.0)
Lymphocytes Relative: 42 % (ref 12–46)
Lymphs Abs: 2.4 10*3/uL (ref 0.7–4.0)
MCH: 27.8 pg (ref 26.0–34.0)
MCHC: 32.1 g/dL (ref 30.0–36.0)
MCV: 86.6 fL (ref 78.0–100.0)
MONOS PCT: 10 % (ref 3–12)
Monocytes Absolute: 0.6 10*3/uL (ref 0.1–1.0)
NEUTROS PCT: 39 % — AB (ref 43–77)
Neutro Abs: 2.2 10*3/uL (ref 1.7–7.7)
Platelets: 263 10*3/uL (ref 150–400)
RBC: 4.85 MIL/uL (ref 3.87–5.11)
RDW: 14.9 % (ref 11.5–15.5)
WBC: 5.6 10*3/uL (ref 4.0–10.5)

## 2013-10-17 LAB — PROTIME-INR
INR: 0.95 (ref 0.00–1.49)
PROTHROMBIN TIME: 12.7 s (ref 11.6–15.2)

## 2013-10-17 NOTE — Progress Notes (Signed)
Pt. Aware of need to stop aleve, aspirin, vitamins

## 2013-10-24 MED ORDER — CEFAZOLIN SODIUM-DEXTROSE 2-3 GM-% IV SOLR
2.0000 g | INTRAVENOUS | Status: AC
  Administered 2013-10-25: 2 g via INTRAVENOUS
  Filled 2013-10-24: qty 50

## 2013-10-24 NOTE — H&P (Signed)
   History:  Denise Becker returns to discuss definitive surgery for her right breast cancer.  This patient was initially diagnosed with locally advanced cancer of the right breast, central location. She presented In Dec., 2014 with obvious nipple retraction, a tumor at least 7 cm transversely and at least 5 cm vertically. There was no palpable adenopathy. Clinical stage was T3, N0, receptor positive, HER-2/neu negative.  A Port-A-Cath was inserted and functioned normally in the perioperative period but then it kinked and she had to be returned to the operating room to reposition the port and take the kink out of the catheter and it has since worked fine. She has received her first cycle of chemotherapy.  She has no complaints about the port  She has now completed her chemotherapy, and was referred back to me by Dr. Jana Hakim for definitive surgery and removal of the Port-A-Cath.  Mid treatment MRI was performed on 06/27/2013. It shows that the transverse dimensions of the tumor have gone down from 6.8 cm to 4.8 cm. There are no abnormal lymph nodes. There is no mass or enhancement in the left breast.  She thinks the tumor smaller states that she really does not examine her breasts.  No further imaging has been done.   Past history, family history, social history, and review of systems are all documented obstructive unchanged and noncontributory except as described above. She does have chronic lower extremity edema.  .  Exam:  Patient looks good. She seems comfortable  Neck reveals no adenopathy or mass.  Lungs are clear to auscultation.  Port slight right infraclavicular area looks good. No hematoma or infection. Minimal tenderness  Right breast reveals soft skin. . Palpable mass in the central breast at least 4 cm in diameter. May be softer but still obviously palpable. Nipple is retracted but no obvious tumor in the skin.No axillary mass.  Heart regular rate and rhythm. No murmur. No ectopy  Abdomen  somewhat obese. Soft. Reducible hernia in the upper midline. Midline scar in lower abdomen.   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 This seems to have partially, but not completely responded to chemotherapy. She will need a mastectomy  Status post Port-A-Cath insertion and subsequent revision  neoadjuvant chemotherapy Completed.  Status post TAH and BSO  Reducible ventral hernia without obstruction  Hypertension  Anxiety and depression   Plan:  Since she will require a mastectomy, she does not need an MRI.  We talked about her treatment plan at length. She is accepting of my recommendations. She'll be scheduled for right total mastectomy, right axillary SLN biopsy, and a removal of Port-A-Cath.  She knows that she will need postop radiation therapy to the right chest wall.  She knows she can consider delayed reconstruction down the road.  Current consensus at conference was that she would not benefit her to get axillary lymph node dissection.  I discussed the indications, details, techniques, and numerous risk of the surgery with her. She is aware of the risk of bleeding, infection, skin necrosis, or swelling, or numbness, cardiac, pulmonary, and thromboembolic problems. She understands these issues and all of her questions are answered. She agrees with this plan.     Edsel Petrin. Dalbert Batman, M.D., [facility]  General and Minimally invasive Surgery  Breast and Colorectal Surgery  Office: [phone]  Pager: [phone]

## 2013-10-25 ENCOUNTER — Encounter (HOSPITAL_COMMUNITY): Payer: Self-pay | Admitting: *Deleted

## 2013-10-25 ENCOUNTER — Encounter (HOSPITAL_COMMUNITY)
Admission: RE | Disposition: A | Discharge status: Home / Self Care | Payer: Self-pay | Source: Ambulatory Visit | Attending: General Surgery

## 2013-10-25 ENCOUNTER — Ambulatory Visit (HOSPITAL_COMMUNITY)
Admission: RE | Admit: 2013-10-25 | Discharge: 2013-10-27 | Disposition: A | Discharge status: Home / Self Care | Payer: 59 | Source: Ambulatory Visit | Attending: General Surgery | Admitting: General Surgery

## 2013-10-25 ENCOUNTER — Ambulatory Visit (HOSPITAL_COMMUNITY)
Admission: RE | Admit: 2013-10-25 | Discharge: 2013-10-25 | Disposition: A | Discharge status: Home / Self Care | Payer: 59 | Source: Ambulatory Visit | Attending: General Surgery | Admitting: General Surgery

## 2013-10-25 ENCOUNTER — Encounter (HOSPITAL_COMMUNITY): Payer: 59 | Admitting: Anesthesiology

## 2013-10-25 ENCOUNTER — Ambulatory Visit (HOSPITAL_COMMUNITY): Payer: 59 | Admitting: Anesthesiology

## 2013-10-25 DIAGNOSIS — Z87891 Personal history of nicotine dependence: Secondary | ICD-10-CM | POA: Insufficient documentation

## 2013-10-25 DIAGNOSIS — R112 Nausea with vomiting, unspecified: Secondary | ICD-10-CM | POA: Diagnosis not present

## 2013-10-25 DIAGNOSIS — F329 Major depressive disorder, single episode, unspecified: Secondary | ICD-10-CM | POA: Diagnosis not present

## 2013-10-25 DIAGNOSIS — K449 Diaphragmatic hernia without obstruction or gangrene: Secondary | ICD-10-CM | POA: Diagnosis not present

## 2013-10-25 DIAGNOSIS — C50919 Malignant neoplasm of unspecified site of unspecified female breast: Secondary | ICD-10-CM | POA: Diagnosis present

## 2013-10-25 DIAGNOSIS — Z9221 Personal history of antineoplastic chemotherapy: Secondary | ICD-10-CM | POA: Diagnosis not present

## 2013-10-25 DIAGNOSIS — F411 Generalized anxiety disorder: Secondary | ICD-10-CM | POA: Diagnosis not present

## 2013-10-25 DIAGNOSIS — Z6841 Body Mass Index (BMI) 40.0 and over, adult: Secondary | ICD-10-CM | POA: Insufficient documentation

## 2013-10-25 DIAGNOSIS — I10 Essential (primary) hypertension: Secondary | ICD-10-CM | POA: Insufficient documentation

## 2013-10-25 DIAGNOSIS — C50419 Malignant neoplasm of upper-outer quadrant of unspecified female breast: Secondary | ICD-10-CM

## 2013-10-25 DIAGNOSIS — K439 Ventral hernia without obstruction or gangrene: Secondary | ICD-10-CM | POA: Diagnosis not present

## 2013-10-25 DIAGNOSIS — F3289 Other specified depressive episodes: Secondary | ICD-10-CM | POA: Insufficient documentation

## 2013-10-25 DIAGNOSIS — C50119 Malignant neoplasm of central portion of unspecified female breast: Secondary | ICD-10-CM | POA: Diagnosis not present

## 2013-10-25 DIAGNOSIS — Z170 Estrogen receptor positive status [ER+]: Secondary | ICD-10-CM | POA: Diagnosis not present

## 2013-10-25 DIAGNOSIS — C50411 Malignant neoplasm of upper-outer quadrant of right female breast: Secondary | ICD-10-CM

## 2013-10-25 DIAGNOSIS — Z452 Encounter for adjustment and management of vascular access device: Secondary | ICD-10-CM | POA: Insufficient documentation

## 2013-10-25 DIAGNOSIS — C50111 Malignant neoplasm of central portion of right female breast: Secondary | ICD-10-CM | POA: Diagnosis present

## 2013-10-25 HISTORY — PX: MASTECTOMY COMPLETE / SIMPLE W/ SENTINEL NODE BIOPSY: SUR846

## 2013-10-25 HISTORY — DX: Gastro-esophageal reflux disease without esophagitis: K21.9

## 2013-10-25 HISTORY — PX: PORT-A-CATH REMOVAL: SHX5289

## 2013-10-25 HISTORY — DX: Pathological dislocation of unspecified shoulder, not elsewhere classified: M24.319

## 2013-10-25 HISTORY — PX: MASTECTOMY W/ SENTINEL NODE BIOPSY: SHX2001

## 2013-10-25 HISTORY — DX: Unspecified osteoarthritis, unspecified site: M19.90

## 2013-10-25 LAB — CBC
HEMATOCRIT: 36.9 % (ref 36.0–46.0)
Hemoglobin: 12 g/dL (ref 12.0–15.0)
MCH: 27.3 pg (ref 26.0–34.0)
MCHC: 32.5 g/dL (ref 30.0–36.0)
MCV: 84.1 fL (ref 78.0–100.0)
Platelets: 309 10*3/uL (ref 150–400)
RBC: 4.39 MIL/uL (ref 3.87–5.11)
RDW: 14.7 % (ref 11.5–15.5)
WBC: 8.4 10*3/uL (ref 4.0–10.5)

## 2013-10-25 LAB — CREATININE, SERUM
Creatinine, Ser: 0.65 mg/dL (ref 0.50–1.10)
GFR calc Af Amer: >90 mL/min (ref >90)
GFR calc non Af Amer: >90 mL/min (ref >90)

## 2013-10-25 SURGERY — MASTECTOMY WITH SENTINEL LYMPH NODE BIOPSY
Anesthesia: General | Site: Chest | Laterality: Right

## 2013-10-25 MED ORDER — PROPOFOL 10 MG/ML IV BOLUS
INTRAVENOUS | Status: DC | PRN
  Administered 2013-10-25: 200 mg via INTRAVENOUS

## 2013-10-25 MED ORDER — BUPIVACAINE-EPINEPHRINE (PF) 0.5% -1:200000 IJ SOLN
INTRAMUSCULAR | Status: DC | PRN
  Administered 2013-10-25: 25 mL via PERINEURAL

## 2013-10-25 MED ORDER — CEFAZOLIN SODIUM-DEXTROSE 2-3 GM-% IV SOLR: 2.0000 g | INTRAVENOUS | Status: DC

## 2013-10-25 MED ORDER — LIDOCAINE HCL (CARDIAC) 20 MG/ML IV SOLN
INTRAVENOUS | Status: DC | PRN
  Administered 2013-10-25: 100 mg via INTRAVENOUS

## 2013-10-25 MED ORDER — FENTANYL CITRATE 0.05 MG/ML IJ SOLN
100.0000 ug | Freq: Once | INTRAMUSCULAR | Status: AC
  Administered 2013-10-25: 100 ug via INTRAVENOUS

## 2013-10-25 MED ORDER — ONDANSETRON HCL 4 MG/2ML IJ SOLN: 4.0000 mg | Freq: Once | INTRAMUSCULAR | Status: DC | PRN

## 2013-10-25 MED ORDER — FENTANYL CITRATE 0.05 MG/ML IJ SOLN
INTRAMUSCULAR | Status: AC
  Filled 2013-10-25: qty 2

## 2013-10-25 MED ORDER — HYDROCODONE-ACETAMINOPHEN 5-325 MG PO TABS
1.0000 | ORAL_TABLET | ORAL | Status: DC | PRN
  Administered 2013-10-25 (×2): 1 via ORAL
  Filled 2013-10-25 (×2): qty 1

## 2013-10-25 MED ORDER — GLYCOPYRROLATE 0.2 MG/ML IJ SOLN
INTRAMUSCULAR | Status: DC | PRN
  Administered 2013-10-25: 0.6 mg via INTRAVENOUS

## 2013-10-25 MED ORDER — MIDAZOLAM HCL 2 MG/2ML IJ SOLN
INTRAMUSCULAR | Status: AC
  Filled 2013-10-25: qty 2

## 2013-10-25 MED ORDER — DEXAMETHASONE SODIUM PHOSPHATE 4 MG/ML IJ SOLN
INTRAMUSCULAR | Status: DC | PRN
  Administered 2013-10-25: 4 mg via INTRAVENOUS

## 2013-10-25 MED ORDER — LACTATED RINGERS IV SOLN
INTRAVENOUS | Status: DC
  Administered 2013-10-25: 09:00:00 via INTRAVENOUS

## 2013-10-25 MED ORDER — OXYCODONE HCL 5 MG/5ML PO SOLN: 5.0000 mg | Freq: Once | ORAL | Status: DC | PRN

## 2013-10-25 MED ORDER — POTASSIUM CHLORIDE IN NACL 20-0.9 MEQ/L-% IV SOLN
INTRAVENOUS | Status: DC
  Administered 2013-10-25: 16:00:00 via INTRAVENOUS
  Filled 2013-10-25 (×5): qty 1000

## 2013-10-25 MED ORDER — PROPOFOL 10 MG/ML IV BOLUS
INTRAVENOUS | Status: AC
  Filled 2013-10-25: qty 20

## 2013-10-25 MED ORDER — BUPIVACAINE-EPINEPHRINE (PF) 0.5% -1:200000 IJ SOLN
INTRAMUSCULAR | Status: AC
  Filled 2013-10-25: qty 30

## 2013-10-25 MED ORDER — HYDROMORPHONE HCL PF 1 MG/ML IJ SOLN
0.2500 mg | INTRAMUSCULAR | Status: DC | PRN
  Administered 2013-10-25 (×2): 0.5 mg via INTRAVENOUS

## 2013-10-25 MED ORDER — FENTANYL CITRATE 0.05 MG/ML IJ SOLN
INTRAMUSCULAR | Status: DC | PRN
  Administered 2013-10-25 (×5): 50 ug via INTRAVENOUS

## 2013-10-25 MED ORDER — METHYLENE BLUE 1 % INJ SOLN
INTRAMUSCULAR | Status: DC | PRN
  Administered 2013-10-25: 11:00:00 via INTRAMUSCULAR

## 2013-10-25 MED ORDER — CEFAZOLIN SODIUM-DEXTROSE 2-3 GM-% IV SOLR
2.0000 g | Freq: Once | INTRAVENOUS | Status: AC
  Administered 2013-10-25: 2 g via INTRAVENOUS
  Filled 2013-10-25: qty 50

## 2013-10-25 MED ORDER — BUPIVACAINE-EPINEPHRINE (PF) 0.5% -1:200000 IJ SOLN
INTRAMUSCULAR | Status: DC | PRN
  Administered 2013-10-25: 6 mL

## 2013-10-25 MED ORDER — ENOXAPARIN SODIUM 40 MG/0.4ML ~~LOC~~ SOLN
40.0000 mg | SUBCUTANEOUS | Status: DC
  Administered 2013-10-26 – 2013-10-27 (×2): 40 mg via SUBCUTANEOUS
  Filled 2013-10-25 (×3): qty 0.4

## 2013-10-25 MED ORDER — SODIUM CHLORIDE 0.9 % IJ SOLN
INTRAMUSCULAR | Status: AC
  Filled 2013-10-25: qty 10

## 2013-10-25 MED ORDER — HYDROMORPHONE HCL PF 1 MG/ML IJ SOLN
INTRAMUSCULAR | Status: AC
  Filled 2013-10-25: qty 1

## 2013-10-25 MED ORDER — METHYLENE BLUE 1 % INJ SOLN
INTRAMUSCULAR | Status: AC
  Filled 2013-10-25: qty 10

## 2013-10-25 MED ORDER — AMLODIPINE-OLMESARTAN 10-40 MG PO TABS: 1.0000 | ORAL_TABLET | Freq: Every morning | ORAL | Status: DC

## 2013-10-25 MED ORDER — OXYCODONE HCL 5 MG PO TABS: 5.0000 mg | ORAL_TABLET | Freq: Once | ORAL | Status: DC | PRN

## 2013-10-25 MED ORDER — LACTATED RINGERS IV SOLN
INTRAVENOUS | Status: DC | PRN
  Administered 2013-10-25 (×3): via INTRAVENOUS

## 2013-10-25 MED ORDER — TECHNETIUM TC 99M SULFUR COLLOID FILTERED: 1.0000 | Freq: Once | INTRAVENOUS | Status: AC | PRN

## 2013-10-25 MED ORDER — MIDAZOLAM HCL 5 MG/ML IJ SOLN
2.0000 mg | Freq: Once | INTRAMUSCULAR | Status: AC
  Administered 2013-10-25: 2 mg via INTRAVENOUS

## 2013-10-25 MED ORDER — IRBESARTAN 300 MG PO TABS
300.0000 mg | ORAL_TABLET | Freq: Every morning | ORAL | Status: DC
  Administered 2013-10-26 – 2013-10-27 (×2): 300 mg via ORAL
  Filled 2013-10-25 (×2): qty 1

## 2013-10-25 MED ORDER — TOBRAMYCIN-DEXAMETHASONE 0.3-0.1 % OP SUSP: 1.0000 [drp] | Freq: Two times a day (BID) | OPHTHALMIC | Status: DC

## 2013-10-25 MED ORDER — FENTANYL CITRATE 0.05 MG/ML IJ SOLN: 25.0000 ug | INTRAMUSCULAR | Status: DC | PRN

## 2013-10-25 MED ORDER — CHLORHEXIDINE GLUCONATE 4 % EX LIQD
1.0000 "application " | Freq: Once | CUTANEOUS | Status: DC
  Filled 2013-10-25: qty 15

## 2013-10-25 MED ORDER — NEOSTIGMINE METHYLSULFATE 10 MG/10ML IV SOLN
INTRAVENOUS | Status: DC | PRN
  Administered 2013-10-25: 4 mg via INTRAVENOUS

## 2013-10-25 MED ORDER — FENTANYL CITRATE 0.05 MG/ML IJ SOLN
INTRAMUSCULAR | Status: AC
  Filled 2013-10-25: qty 5

## 2013-10-25 MED ORDER — 0.9 % SODIUM CHLORIDE (POUR BTL) OPTIME
TOPICAL | Status: DC | PRN
  Administered 2013-10-25 (×2): 1000 mL

## 2013-10-25 MED ORDER — ROCURONIUM BROMIDE 100 MG/10ML IV SOLN
INTRAVENOUS | Status: DC | PRN
  Administered 2013-10-25: 50 mg via INTRAVENOUS

## 2013-10-25 MED ORDER — ONDANSETRON HCL 4 MG/2ML IJ SOLN
4.0000 mg | Freq: Four times a day (QID) | INTRAMUSCULAR | Status: DC
  Administered 2013-10-25 – 2013-10-27 (×3): 4 mg via INTRAVENOUS
  Filled 2013-10-25 (×2): qty 2

## 2013-10-25 MED ORDER — PHENYLEPHRINE HCL 10 MG/ML IJ SOLN
INTRAMUSCULAR | Status: DC | PRN
  Administered 2013-10-25 (×6): 80 ug via INTRAVENOUS
  Administered 2013-10-25: 160 ug via INTRAVENOUS
  Administered 2013-10-25: 40 ug via INTRAVENOUS
  Administered 2013-10-25 (×2): 80 ug via INTRAVENOUS

## 2013-10-25 MED ORDER — ONDANSETRON HCL 4 MG/2ML IJ SOLN
INTRAMUSCULAR | Status: DC | PRN
  Administered 2013-10-25: 4 mg via INTRAVENOUS

## 2013-10-25 MED ORDER — AMLODIPINE BESYLATE 10 MG PO TABS
10.0000 mg | ORAL_TABLET | Freq: Every morning | ORAL | Status: DC
  Administered 2013-10-26 – 2013-10-27 (×2): 10 mg via ORAL
  Filled 2013-10-25 (×2): qty 1

## 2013-10-25 SURGICAL SUPPLY — 64 items
APPLIER CLIP 9.375 MED OPEN (MISCELLANEOUS) ×4
BINDER BREAST LRG (GAUZE/BANDAGES/DRESSINGS) IMPLANT
BINDER BREAST XLRG (GAUZE/BANDAGES/DRESSINGS) IMPLANT
BINDER BREAST XXLRG (GAUZE/BANDAGES/DRESSINGS) ×4 IMPLANT
BIOPATCH RED 1 DISK 7.0 (GAUZE/BANDAGES/DRESSINGS) ×6 IMPLANT
BIOPATCH RED 1IN DISK 7.0MM (GAUZE/BANDAGES/DRESSINGS) ×2
CANISTER SUCTION 2500CC (MISCELLANEOUS) ×4 IMPLANT
CHLORAPREP W/TINT 26ML (MISCELLANEOUS) ×4 IMPLANT
CLIP APPLIE 9.375 MED OPEN (MISCELLANEOUS) ×2 IMPLANT
CONT SPEC 4OZ CLIKSEAL STRL BL (MISCELLANEOUS) ×8 IMPLANT
COVER PROBE W GEL 5X96 (DRAPES) ×4 IMPLANT
COVER SURGICAL LIGHT HANDLE (MISCELLANEOUS) ×4 IMPLANT
DERMABOND ADHESIVE PROPEN (GAUZE/BANDAGES/DRESSINGS) ×2
DERMABOND ADVANCED (GAUZE/BANDAGES/DRESSINGS) ×2
DERMABOND ADVANCED .7 DNX12 (GAUZE/BANDAGES/DRESSINGS) ×2 IMPLANT
DERMABOND ADVANCED .7 DNX6 (GAUZE/BANDAGES/DRESSINGS) ×2 IMPLANT
DRAIN CHANNEL 19F RND (DRAIN) ×8 IMPLANT
DRAPE CHEST BREAST 15X10 FENES (DRAPES) ×4 IMPLANT
DRAPE PED LAPAROTOMY (DRAPES) IMPLANT
DRAPE PROXIMA HALF (DRAPES) ×4 IMPLANT
DRAPE UTILITY 15X26 W/TAPE STR (DRAPE) ×8 IMPLANT
DRSG PAD ABDOMINAL 8X10 ST (GAUZE/BANDAGES/DRESSINGS) ×8 IMPLANT
DRSG TEGADERM 4X4.75 (GAUZE/BANDAGES/DRESSINGS) ×4 IMPLANT
ELECT BLADE 4.0 EZ CLEAN MEGAD (MISCELLANEOUS) ×4
ELECT CAUTERY BLADE 6.4 (BLADE) ×4 IMPLANT
ELECT REM PT RETURN 9FT ADLT (ELECTROSURGICAL) ×4
ELECTRODE BLDE 4.0 EZ CLN MEGD (MISCELLANEOUS) ×2 IMPLANT
ELECTRODE REM PT RTRN 9FT ADLT (ELECTROSURGICAL) ×2 IMPLANT
EVACUATOR SILICONE 100CC (DRAIN) ×8 IMPLANT
GAUZE SPONGE 4X4 16PLY XRAY LF (GAUZE/BANDAGES/DRESSINGS) ×4 IMPLANT
GLOVE BIOGEL PI IND STRL 7.0 (GLOVE) ×6 IMPLANT
GLOVE BIOGEL PI IND STRL 7.5 (GLOVE) ×2 IMPLANT
GLOVE BIOGEL PI INDICATOR 7.0 (GLOVE) ×6
GLOVE BIOGEL PI INDICATOR 7.5 (GLOVE) ×2
GLOVE ECLIPSE 7.5 STRL STRAW (GLOVE) ×4 IMPLANT
GLOVE EUDERMIC 7 POWDERFREE (GLOVE) ×4 IMPLANT
GLOVE SURG SIGNA 7.5 PF LTX (GLOVE) ×4 IMPLANT
GLOVE SURG SS PI 7.0 STRL IVOR (GLOVE) ×4 IMPLANT
GOWN STRL REUS W/ TWL LRG LVL3 (GOWN DISPOSABLE) ×6 IMPLANT
GOWN STRL REUS W/ TWL XL LVL3 (GOWN DISPOSABLE) ×4 IMPLANT
GOWN STRL REUS W/TWL LRG LVL3 (GOWN DISPOSABLE) ×6
GOWN STRL REUS W/TWL XL LVL3 (GOWN DISPOSABLE) ×4
KIT BASIN OR (CUSTOM PROCEDURE TRAY) ×4 IMPLANT
KIT ROOM TURNOVER OR (KITS) ×4 IMPLANT
NEEDLE 18GX1X1/2 (RX/OR ONLY) (NEEDLE) ×4 IMPLANT
NEEDLE 25GAX1.5 (MISCELLANEOUS) ×8 IMPLANT
NEEDLE HYPO 25GX1X1/2 BEV (NEEDLE) ×8 IMPLANT
NS IRRIG 1000ML POUR BTL (IV SOLUTION) ×4 IMPLANT
PACK GENERAL/GYN (CUSTOM PROCEDURE TRAY) ×4 IMPLANT
PACK SURGICAL SETUP 50X90 (CUSTOM PROCEDURE TRAY) ×4 IMPLANT
PAD ARMBOARD 7.5X6 YLW CONV (MISCELLANEOUS) ×4 IMPLANT
PENCIL BUTTON HOLSTER BLD 10FT (ELECTRODE) ×4 IMPLANT
PIN SAFETY STERILE (MISCELLANEOUS) ×4 IMPLANT
SPECIMEN JAR X LARGE (MISCELLANEOUS) ×4 IMPLANT
SPONGE GAUZE 4X4 12PLY STER LF (GAUZE/BANDAGES/DRESSINGS) ×4 IMPLANT
SPONGE LAP 18X18 X RAY DECT (DISPOSABLE) ×4 IMPLANT
SUT ETHILON 3 0 FSL (SUTURE) ×8 IMPLANT
SUT MNCRL AB 4-0 PS2 18 (SUTURE) ×4 IMPLANT
SUT SILK 2 0 FS (SUTURE) ×4 IMPLANT
SUT VIC AB 3-0 SH 18 (SUTURE) ×8 IMPLANT
SYR CONTROL 10ML LL (SYRINGE) ×4 IMPLANT
SYRINGE CONTROL L 12CC (SYRINGE) ×4 IMPLANT
TOWEL OR 17X24 6PK STRL BLUE (TOWEL DISPOSABLE) ×4 IMPLANT
TOWEL OR 17X26 10 PK STRL BLUE (TOWEL DISPOSABLE) ×4 IMPLANT

## 2013-10-25 NOTE — Op Note (Signed)
Patient Name:           Denise Becker   Date of Surgery:        10/25/2013  Note: This dictation was prepared with Dragon/digital dictation along with [facility] technology. Any transcriptional errors that result from this process are unintentional.   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    Status post neoadjuvant chemotherapy with partial response    Status post Port-A-Cath insertion and subsequent revision         Post op Diagnosis:    same  Procedure:                 Inject blue dye right breast Right total mastectomy Right axillary sentinel biopsy Removal of Port-A-Cath  Surgeon:                     Edsel Petrin. Dalbert Batman, M.D., FACS  Assistant:                      Coralie Keens, M.D.  Operative Indications:   This patient was initially diagnosed with locally advanced cancer of the right breast, central location. She presented In Dec., 2014 with obvious nipple retraction, a tumor at least 7 cm transversely and at least 5 cm vertically. There was no palpable adenopathy. Clinical stage was T3, N0, receptor positive, HER-2/neu negative.  A Port-A-Cath was inserted .  She has now completed her chemotherapy, and was referred back to me by Dr. Jana Hakim for definitive surgery and removal of the Port-A-Cath.  Mid treatment MRI was performed on 06/27/2013. It shows that the transverse dimensions of the tumor have gone down from 6.8 cm to 4.8 cm. There are no abnormal lymph nodes. There is no mass or enhancement in the left breast. She thinks the tumor is smaller. My exam reveals a palpable mass in the central breast 3 or 4 cm in diameter. A softer. The nipple is still retracted. There is no ulceration of the skin. There is no axillary mass. She is brought to the operating room for mastectomy and Port-A-Cath removal.  Operative  Findings:       The mastectomy dissection was uneventful. I did not encounter any obvious cancer. The breast came off the pectoralis major and minor cleanly. I found 2 sentinel lymph nodes, neither of which looked pathologically enlarged.  Procedure in Detail:          The patient underwent injection of radionuclide in the holding area and was then taken to the operating room where general endotracheal anesthesia was induced. Intravenous antibiotics were given. Surgical time out was performed.       Following alcohol prep, I injected 5 cc of blue dye into the right breast, subareolar area and massaged the breast for a few minutes. This was 2 cc of methylene blue mixed with 3 cc of saline. We then prepped and draped of the right breast, axilla and chest wall.      Using a marking pen I designed a transverse elliptical incision to include the nipple and areola. Incision was made with the knife.    Skin flaps were raised superiorly to the infraclavicular area, medially to the parasternal area, inferiorly to the inframammary crease and anterior rectus sheath and laterally to the latissimus dorsi muscle. The superior flap medially was dissected up into the capsule of the port. I was  able to  Control the port with an Allis clamp and cut and removed all 3 Prolene sutures. The catheter and port were removed intact and there was no bleeding. The breast was then dissected off of the pectoralis major and minor muscles with electrocautery. I marked the lateral skin margin with a silk suture. Using the neoprobe I dissected up into the axilla and I found 2 sentinel lymph nodes in let the level I area. These were both hot and blue. After these were removed there was almost no radioactivity in the axilla. I continued the dissection up into the axilla and removed the Tail of Spence. The breast was then removed and sent for routine histology. Hemostasis was excellent and achieved with electrocautery and a few metal clips. The  wound was irrigated with 2 L of saline. It looked clean. Two 49 French Blake drains were placed, one up into the axillary area and one across the skin flaps. These were brought out through separate stab incisions inferolaterally, suture the skin and connected to suction bulbs. The subcutaneous tissue was closed with interrupted 3-0 Vicryl sutures and the skin closed with a running subcuticular suture of 4-0 Monocryl and Dermabond. Dry bandages and a breast binder were placed. The patient tolerated the procedure well taken to PACU in stable condition. EBL 100 cc or less. Counts correct. Complications none.     Edsel Petrin. Dalbert Batman, M.D., FACS General and Minimally Invasive Surgery Breast and Colorectal Surgery  10/25/2013 11:50 AM

## 2013-10-25 NOTE — Transfer of Care (Signed)
Immediate Anesthesia Transfer of Care Note  Patient: Denise Becker  Procedure(s) Performed: Procedure(s): RIGHT TOTAL MASTECTOMY WITH SENTINEL LYMPH NODE BIOPSY (Right) REMOVAL PORT-A-CATH (N/A)  Patient Location: PACU  Anesthesia Type:GA combined with regional for post-op pain  Level of Consciousness: awake, alert , oriented, patient cooperative and responds to stimulation  Airway & Oxygen Therapy: Patient Spontanous Breathing and Patient connected to nasal cannula oxygen  Post-op Assessment: Report given to PACU RN, Post -op Vital signs reviewed and stable and Patient moving all extremities X 4  Post vital signs: Reviewed and stable  Complications: No apparent anesthesia complications

## 2013-10-25 NOTE — Interval H&P Note (Signed)
History and Physical Interval Note:  10/25/2013 9:17 AM  Denise Becker  has presented today for surgery, with the diagnosis of right breast cancer  The goals and the various methods of treatment have been discussed with the patient and family. After consideration of risks, benefits and other options for treatment, the patient has consented to  Procedure(s): RIGHT TOTAL MASTECTOMY WITH SENTINEL LYMPH NODE BIOPSY (Right) REMOVAL PORT-A-CATH (N/A) as a surgical intervention .  The patient's history has been reviewed, patient examined today, no change in status, stable for surgery.  I have reviewed the patient's chart and labs.  Questions were answered to the patient's satisfaction.     Adin Hector

## 2013-10-25 NOTE — Anesthesia Procedure Notes (Addendum)
Anesthesia Regional Block:  Pectoralis block  Pre-Anesthetic Checklist: ,, timeout performed, Correct Patient, Correct Site, Correct Laterality, Correct Procedure, Correct Position, site marked, Risks and benefits discussed,  Surgical consent,  Pre-op evaluation,  At surgeon's request and post-op pain management  Laterality: Right and Upper  Prep: chloraprep       Needles:  Injection technique: Single-shot  Needle Type: Echogenic Needle     Needle Length: 9cm 9 cm Needle Gauge: 21 and 21 G    Additional Needles:  Procedures: ultrasound guided (picture in chart) Pectoralis block Narrative:  Start time: 10/25/2013 9:04 AM End time: 10/25/2013 9:08 AM Injection made incrementally with aspirations every 5 mL.  Performed by: Personally  Anesthesiologist: Lorrene Reid, MD

## 2013-10-25 NOTE — Anesthesia Postprocedure Evaluation (Signed)
  Anesthesia Post-op Note  Patient: Denise Becker  Procedure(s) Performed: Procedure(s): RIGHT TOTAL MASTECTOMY WITH SENTINEL LYMPH NODE BIOPSY (Right) REMOVAL PORT-A-CATH (N/A)  Patient Location: PACU  Anesthesia Type:General and Regional  Level of Consciousness: awake  Airway and Oxygen Therapy: Patient Spontanous Breathing  Post-op Pain: mild  Post-op Assessment: Post-op Vital signs reviewed, Patient's Cardiovascular Status Stable, Respiratory Function Stable, Patent Airway and Pain level controlled  Post-op Vital Signs: Reviewed and stable  Last Vitals:  Filed Vitals:   10/25/13 1702  BP: 112/52  Pulse: 89  Temp: 36.8 C  Resp: 16    Complications: No apparent anesthesia complications

## 2013-10-25 NOTE — Anesthesia Preprocedure Evaluation (Addendum)
Anesthesia Evaluation  Patient identified by MRN, date of birth, ID band Patient awake    Reviewed: Allergy & Precautions, H&P , NPO status , Patient's Chart, lab work & pertinent test results  Airway Mallampati: II TM Distance: >3 FB Neck ROM: Full    Dental  (+) Teeth Intact, Poor Dentition, Loose,    Pulmonary former smoker,  breath sounds clear to auscultation        Cardiovascular hypertension, Pt. on medications Rhythm:Regular Rate:Normal     Neuro/Psych    GI/Hepatic hiatal hernia,   Endo/Other  Morbid obesity  Renal/GU      Musculoskeletal   Abdominal   Peds  Hematology   Anesthesia Other Findings   Reproductive/Obstetrics                          Anesthesia Physical Anesthesia Plan  ASA: II  Anesthesia Plan: General   Post-op Pain Management:    Induction: Intravenous  Airway Management Planned: Oral ETT  Additional Equipment:   Intra-op Plan:   Post-operative Plan: Extubation in OR  Informed Consent: I have reviewed the patients History and Physical, chart, labs and discussed the procedure including the risks, benefits and alternatives for the proposed anesthesia with the patient or authorized representative who has indicated his/her understanding and acceptance.   Dental advisory given  Plan Discussed with: CRNA, Anesthesiologist and Surgeon  Anesthesia Plan Comments:         Anesthesia Quick Evaluation

## 2013-10-25 NOTE — OR Nursing (Signed)
Fanny Skates, MD removed Port-A-Cath on 10/25/2013. Hub and tip noted to be intact.  Narda Rutherford, RN

## 2013-10-26 ENCOUNTER — Encounter (HOSPITAL_COMMUNITY): Payer: Self-pay | Admitting: General Surgery

## 2013-10-26 DIAGNOSIS — C50119 Malignant neoplasm of central portion of unspecified female breast: Secondary | ICD-10-CM | POA: Diagnosis not present

## 2013-10-26 MED ORDER — TOBRAMYCIN-DEXAMETHASONE 0.3-0.1 % OP SUSP
1.0000 [drp] | Freq: Two times a day (BID) | OPHTHALMIC | Status: DC
  Administered 2013-10-26 – 2013-10-27 (×3): 1 [drp] via OPHTHALMIC
  Filled 2013-10-26: qty 2.5

## 2013-10-26 NOTE — Progress Notes (Signed)
1 Day Post-Op  Subjective: Stable and alert. Was nauseated and had one episode of emesis last minute. She feels better but is afraid to go home today and wants to stay 1 more day. Ambulates to bathroom and voiding normally. No reported problems with wound or drains.  Objective: Vital signs in last 24 hours: Temp:  [97.6 F (36.4 C)-98.3 F (36.8 C)] 98.1 F (36.7 C) (07/29 0526) Pulse Rate:  [63-108] 80 (07/29 0526) Resp:  [10-27] 18 (07/29 0526) BP: (108-157)/(52-90) 108/58 mmHg (07/29 0526) SpO2:  [95 %-100 %] 100 % (07/29 0526) Weight:  [216 lb (97.977 kg)] 216 lb (97.977 kg) (07/28 0831) Last BM Date: 10/24/13  Intake/Output from previous day: 07/28 0701 - 07/29 0700 In: 3430 [P.O.:480; I.V.:2950] Out: 2415 [Urine:2290; Drains:125] Intake/Output this shift: Total I/O In: 1040 [P.O.:240; I.V.:800] Out: 1880 [Urine:1850; Drains:30]    EXAM: General appearance: alert. No distress. Mental status normal Resp: clear to auscultation bilaterally Breasts:, right mastectomy skin flaps healthy,  warm and viable. No necrosis. Skin flaps adherent to chest wall. No hematoma. Both drains functioning with  moderate serosanguineous output. No arm swelling. No sensory deficit right upper arm.  Lab Results:  Results for orders placed during the hospital encounter of 10/25/13 (from the past 24 hour(s))  CBC     Status: None   Collection Time    10/25/13  3:30 PM      Result Value Ref Range   WBC 8.4  4.0 - 10.5 K/uL   RBC 4.39  3.87 - 5.11 MIL/uL   Hemoglobin 12.0  12.0 - 15.0 g/dL   HCT 36.9  36.0 - 46.0 %   MCV 84.1  78.0 - 100.0 fL   MCH 27.3  26.0 - 34.0 pg   MCHC 32.5  30.0 - 36.0 g/dL   RDW 14.7  11.5 - 15.5 %   Platelets 309  150 - 400 K/uL  CREATININE, SERUM     Status: None   Collection Time    10/25/13  3:30 PM      Result Value Ref Range   Creatinine, Ser 0.65  0.50 - 1.10 mg/dL   GFR calc non Af Amer >90  >90 mL/min   GFR calc Af Amer >90  >90 mL/min      Studies/Results: Nm Sentinel Node Inj-no Rpt (melanoma)  10/25/2013   CLINICAL DATA: Cancer right breast   Sulfur colloid was injected by the nuclear medicine technologist for  melanoma sentinel node.     Marland Kitchen amLODipine  10 mg Oral q morning - 10a   And  . irbesartan  300 mg Oral q morning - 10a  . enoxaparin (LOVENOX) injection  40 mg Subcutaneous Q24H  . ondansetron (ZOFRAN) IV  4 mg Intravenous 4 times per day     Assessment/Plan: s/p Procedure(s): RIGHT TOTAL MASTECTOMY WITH SENTINEL LYMPH NODE BIOPSY REMOVAL PORT-A-CATH  POD #1. Removal of Port-A-Cath. Right total mastectomy and sentinel node biopsy. Stable Nausea and vomiting. Hopefully this will be self-limited Advance diet as tolerated ambulate frequently Plan discharge tomorrow.   @PROBHOSP @  LOS: 1 day    Kiko Ripp M 10/26/2013  . .prob

## 2013-10-27 DIAGNOSIS — C50119 Malignant neoplasm of central portion of unspecified female breast: Secondary | ICD-10-CM | POA: Diagnosis not present

## 2013-10-27 MED ORDER — HYDROCODONE-ACETAMINOPHEN 5-325 MG PO TABS: 1.0000 | ORAL_TABLET | ORAL | Status: DC | PRN

## 2013-10-27 NOTE — Discharge Summary (Signed)
Patient ID: Denise Becker [id number] 62 y.o. 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  2)   Status post neoadjuvant chemotherapy with partial response  3)   Status post Port-A-Cath insertion and subsequent revision  4)   Status post TAH and BSO  5)   Reducible ventral hernia without obstruction  6)   Hypertension  7)   Anxiety and depression    Operations: Procedure(s): RIGHT TOTAL MASTECTOMY WITH SENTINEL LYMPH NODE BIOPSY REMOVAL PORT-A-CATH  Admission Condition: good  Discharged Condition: good  Indication for Admission: This 63 year old African American woman is admitted electively for definitive breast cancer surgery. She was initially diagnosed with locally advanced cancer of the right breast, central location. She presented In Dec., 2014 with obvious nipple retraction, a tumor at least 7 cm transversely and at least 5 cm vertically. There was no palpable adenopathy. Clinical stage was T3, N0, receptor positive, HER-2/neu negative.  A Port-A-Cath was inserted .  She has now completed her chemotherapy, and was referred back to me by Dr. Jana Hakim for definitive surgery and removal of the Port-A-Cath.  Mid treatment MRI was performed on 06/27/2013. It shows that the transverse dimensions of the tumor have gone down from 6.8 cm to 4.8 cm. There are no abnormal lymph nodes. There is no mass or enhancement in the left breast. She thinks the tumor is smaller. My exam reveals a palpable mass in the central breast 3 or 4 cm in diameter and softer. The nipple is still retracted. There is no ulceration of the skin. There is no  axillary mass.  She is brought to the operating room for mastectomy and Port-A-Cath removal.   Hospital Course: On the day of admission the patient was taken to the operating room and underwent a right total mastectomy, right axillary sentinel the biopsy, and removal of Port-A-Cath. Pathology is pending at this time. Postoperatively she did very very well except for some nausea and vomiting the first 24 hours. Her wounds look fine. Over the next 24 hours she resumed toleration of diet, ambulation in the halls,  and had no wound problems. The day of discharge her right mastectomy skin flaps were warm and viable without any skin necrosis, there was no fluid collection or hematoma. Both drains were functioning within serosanguineous drainage.     She was given instructions in diet, activities, wound and drain care. She was given a prescription for hydrocodone for pain. She was asked to return to the office in 7-8 days for a wound and drain check. We will call the pathology report to her and she will followup with her medical oncologist and radiation oncologist postop.  Consults: None  Significant Diagnostic Studies: Surgical pathology, pending  Treatments: surgery: Right total mastectomy, right axillary sentinel node biopsy, removal of Port-A-Cath  Disposition: Home  Patient Instructions:    Medication List         AZOR 10-40 MG per tablet  Generic drug:  amLODipine-olmesartan  Take 1 tablet by mouth every morning.     HYDROcodone-acetaminophen 5-325 MG per tablet  Commonly known as:  NORCO/VICODIN  Take 1-2 tablets by mouth every 4 (four) hours as needed for moderate pain.     multivitamin capsule  Take 1  capsule by mouth daily.     naproxen sodium 220 MG tablet  Commonly known as:  ANAPROX  Take 220 mg by mouth 2 (two) times daily as needed.     tobramycin-dexamethasone ophthalmic solution  Commonly known as:  TOBRADEX  Place 1 drop into both eyes 2 (two) times daily.         Activity: activity as tolerated. Careful instructions given. No driving. No lifting more than 15 pounds. Diet: low fat, low cholesterol diet Wound Care: as directed  Follow-up:  With Dr. Dalbert Batman in 1 week.  Signed: Edsel Petrin. Dalbert Batman, M.D., FACS General and minimally invasive surgery Breast and Colorectal Surgery  10/27/2013, 7:11 AM

## 2013-10-27 NOTE — Discharge Instructions (Signed)
-  see above 

## 2013-10-28 NOTE — Progress Notes (Signed)
Quick Note:  Inform patient of Pathology report,.Tell her that the breast cancer was completely removed with a negative margin. Tell her that her lymph nodes are negative. This is good news and she will not need any further surgery. I will discuss this with her in detail at her first office visit.  hmi ______

## 2013-11-02 ENCOUNTER — Other Ambulatory Visit (INDEPENDENT_AMBULATORY_CARE_PROVIDER_SITE_OTHER): Payer: Self-pay

## 2013-11-02 ENCOUNTER — Encounter (INDEPENDENT_AMBULATORY_CARE_PROVIDER_SITE_OTHER): Payer: Self-pay | Admitting: General Surgery

## 2013-11-02 ENCOUNTER — Ambulatory Visit (INDEPENDENT_AMBULATORY_CARE_PROVIDER_SITE_OTHER): Payer: 59 | Admitting: General Surgery

## 2013-11-02 VITALS — BP 128/78 | HR 81 | Temp 97.4°F | Ht 61.0 in | Wt 210.0 lb

## 2013-11-02 DIAGNOSIS — D0591 Unspecified type of carcinoma in situ of right breast: Secondary | ICD-10-CM

## 2013-11-02 DIAGNOSIS — C50419 Malignant neoplasm of upper-outer quadrant of unspecified female breast: Secondary | ICD-10-CM

## 2013-11-02 DIAGNOSIS — C50411 Malignant neoplasm of upper-outer quadrant of right female breast: Secondary | ICD-10-CM

## 2013-11-02 NOTE — Patient Instructions (Signed)
Your right mastectomy wound is healing normally. No signs of infection or blood clots.  Both drains are draining well but they are draining too much fluid to remove today.  We discussed your pathology report today. The tumor was still 7.2 cm in size but the lymph nodes were negative and all the cancer was removed.  You will be given an appointment to see Dr. Dalbert Batman in one week to recheck the wound and the drains.  Keep your appointment was Dr. Jana Hakim on August 24. He will possibly discuss antiestrogen  pills with you at that time  We will refer you back to your radiation oncologist, Dr. Lance Morin, to discuss with you should receive radiation therapy.

## 2013-11-02 NOTE — Progress Notes (Signed)
Patient ID: Denise Becker, female   DOB: [birth year], 63 y.o.   MRN: [id number]  History: This patient returns for a postop visit She was initially diagnosed with locally advanced cancer of the right breast, central location, involving the nipple in December 2014. She was clinically node negative, receptor positive and HER-2/neu negative. She underwent neoadjuvant chemotherapy the tumor did feel smaller and softer but was still significant in size. On 10/25/2013 she underwent right total mastectomy, right axillary sentinel node biopsy, and removal of Port-A-Cath. Final pathology report showed invasive lobular carcinoma, 7.2 cm diameter, involving nipple, negative margins, negative sentinel nodes. She is feeling well. She says she has no pain. Both drains are still draining more than 30 cc per day. I reviewed her pathology report with her.  Exam: Patient looks well. Friendly and cooperative his usual. Right mastectomy incision is healing without any sign of retained fluid hematoma or infection. Skin edges are healthy. Drainage is serosanguineous.  Assessment: Locally advanced invasive lobular carcinoma right breast, receptor positive, HER-2/neu negative. Doing well in the early postoperative following right total mastectomy and sentinel lymph biopsy Pathologic stage ypT3, ypN0.  Plan: Drains are left in. Return to see me in one week Keep appt. with Dr. Jana Hakim on August 24. I advised her that he will discuss the role antiestrogen therapy with her. Refer back to Dr. Thea Silversmith to see whether she should undergo chest wall radiation because of her T3 tumor.   Edsel Petrin. Dalbert Batman, M.D., [facility] General and Minimally invasive Surgery Breast and Colorectal Surgery Office:   [phone] Pager:   [phone]

## 2013-11-08 ENCOUNTER — Encounter (INDEPENDENT_AMBULATORY_CARE_PROVIDER_SITE_OTHER): Payer: Self-pay | Admitting: General Surgery

## 2013-11-08 ENCOUNTER — Ambulatory Visit (INDEPENDENT_AMBULATORY_CARE_PROVIDER_SITE_OTHER): Payer: 59 | Admitting: General Surgery

## 2013-11-08 VITALS — BP 124/74 | HR 77 | Temp 98.0°F | Ht 61.0 in | Wt 210.0 lb

## 2013-11-08 DIAGNOSIS — C50119 Malignant neoplasm of central portion of unspecified female breast: Secondary | ICD-10-CM

## 2013-11-08 DIAGNOSIS — C50111 Malignant neoplasm of central portion of right female breast: Secondary | ICD-10-CM

## 2013-11-08 DIAGNOSIS — C50411 Malignant neoplasm of upper-outer quadrant of right female breast: Secondary | ICD-10-CM

## 2013-11-08 DIAGNOSIS — C50419 Malignant neoplasm of upper-outer quadrant of unspecified female breast: Secondary | ICD-10-CM

## 2013-11-08 NOTE — Progress Notes (Signed)
Patient ID: Denise Becker, female   DOB: [birth year], 63 y.o.   MRN: [id number]  History:  This patient returns for another postop visit  She was initially diagnosed with locally advanced cancer of the right breast, central location, involving the nipple in December 2014. She was clinically node negative, receptor positive and HER-2/neu negative. She underwent neoadjuvant chemotherapy the tumor did feel smaller and softer but was still significant in size.  On 10/25/2013 she underwent right total mastectomy, right axillary sentinel node biopsy, and removal of Port-A-Cath.  Final pathology report showed invasive lobular carcinoma, 7.2 cm diameter, involving nipple, negative margins, negative sentinel nodes.  She is feeling well. She says she has no pain.  The axillary drain is down to about 10 cc a day. The skin flap arranges greater than 20 cc per day.   Exam:  Patient looks well. Friendly and cooperative his usual.  Right mastectomy incision is healing without any sign of retained fluid hematoma or infection. Skin edges are healthy. Drainage is serosanguineous. Axillary drain was removed. Redressed.   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  Doing well in the early postoperative following right total mastectomy and sentinel lymph biopsy  Pathologic stage ypT3, ypN0.   Plan:  One drain  left in.  Return to see me in one week  Keep appt. with Dr. Jana Hakim on August 24. I advised her that he will discuss the role antiestrogen therapy with her.  See Dr. Thea Silversmith August 13 to see whether she should undergo chest wall radiation because of her T3 tumor   .  Edsel Petrin. Dalbert Batman, M.D., [facility]  General and Minimally invasive Surgery  Breast and Colorectal Surgery  Office: [phone]  Pager: [phone]

## 2013-11-08 NOTE — Patient Instructions (Signed)
We removed one of your drains today. The other drain was draining a little bit too much.  Return to see Dr. Dalbert Batman in one week and hopefully we can remove the other drain at that time  You'll be referred to physical therapy after all of the drains are out.  Keep your appointment with Dr. Pablo Ledger on August 13  Keep your appointment with Dr. Jana Hakim on August 24.

## 2013-11-08 NOTE — Progress Notes (Signed)
Location of Breast Cancer:Right breast upper-outer quadrant 6 cm mass.  Histology per Pathology Report 10/25/2013 DIAGNOSIS Diagnosis 1. Breast, simple mastectomy, Right - INVASIVE LOBULAR CARCINOMA, PLEOMORPHIC VARIANT SEE COMMENT. - NEGATIVE FOR LYMPH VASCULAR INVASION. - INVASIVE TUMOR IS 1.8 CM FROM NEAREST MARGIN (DEEP). - TUMOR DIRECTLY EXTENDS INTO NIPPLE DERMIS AND SUBCUTANEOUS SOFT TISSUE. - DERMAL LYMPHATICS, NEGATIVE FOR TUMOR. - SEE TUMOR SYNOPTIC TEMPLATE BELOW. 2. Lymph node, sentinel, biopsy, Right axilla #1 - ONE LYMPH NODE, NEGATIVE FOR TUMOR (0/1) 1 of 4 FINAL for Denise Becker, Denise Becker ([id number]) Diagnosis(continued) - SEE COMMENT 3. Lymph node, sentinel, biopsy, Right axilla #2 - ONE LYMPH NODE, NEGATIVE FOR TUMOR (0/1)  Receptor Status: ER(+), PR (+), Her2-neu (-)  Did patient present with symptoms (if so, please note symptoms) or was this found on screening mammography?:Visible nipple retraction and palpable mass  Past/Anticipated interventions by surgeon, if any:10/25/13 Panel 1: Right RIGHT TOTAL MASTECTOMY WITH SENTINEL LYMPH NODE BIOPSY with Adin Hector, MD   Past/Anticipated interventions by medical oncology, if any: Chemotherapy completed 6 cycles of TAC on 08/26/2013.  Lymphedema issues, if any:No  Pain issues, if any:No  SAFETY ISSUES:  Prior radiation?No  Pacemaker/ICD? No  Possible current pregnancy?No  Is the patient on methotrexate?No  Current Complaints / other details:GXP1.Menses age 16.First live birth age 17.Last menstrual period 15 years ago.No hormone replacement therapy. Patient still has one jp drain, to follow up with Dr.Ingram 11/15/2013. Former smoker. NKDA    Arlyss Repress, RN 11/08/2013,9:49 AM

## 2013-11-10 ENCOUNTER — Telehealth: Payer: Self-pay | Admitting: Nurse Practitioner

## 2013-11-10 ENCOUNTER — Ambulatory Visit
Admission: RE | Admit: 2013-11-10 | Discharge: 2013-11-10 | Disposition: A | Discharge status: Home / Self Care | Payer: 59 | Source: Ambulatory Visit | Attending: Radiation Oncology | Admitting: Radiation Oncology

## 2013-11-10 ENCOUNTER — Telehealth: Payer: Self-pay | Admitting: *Deleted

## 2013-11-10 DIAGNOSIS — I87309 Chronic venous hypertension (idiopathic) without complications of unspecified lower extremity: Secondary | ICD-10-CM | POA: Diagnosis not present

## 2013-11-10 DIAGNOSIS — Z510 Encounter for antineoplastic radiation therapy: Secondary | ICD-10-CM | POA: Insufficient documentation

## 2013-11-10 DIAGNOSIS — C50411 Malignant neoplasm of upper-outer quadrant of right female breast: Secondary | ICD-10-CM

## 2013-11-10 DIAGNOSIS — Z9221 Personal history of antineoplastic chemotherapy: Secondary | ICD-10-CM | POA: Insufficient documentation

## 2013-11-10 DIAGNOSIS — Z9010 Acquired absence of unspecified breast and nipple: Secondary | ICD-10-CM | POA: Insufficient documentation

## 2013-11-10 DIAGNOSIS — C50419 Malignant neoplasm of upper-outer quadrant of unspecified female breast: Secondary | ICD-10-CM | POA: Insufficient documentation

## 2013-11-10 NOTE — Telephone Encounter (Signed)
CALLED PATIENT TO INFORM OF APPT. Denise Becker ON 11-11-13 @ 9:15 AM, LVM FOR A RETURN CALL

## 2013-11-10 NOTE — Telephone Encounter (Signed)
recvd a call from rad on stating Dr Pablo Ledger wanted pt to see heather per conversation w/her on 8/14. Per Nira Conn to put pt on her sch.Cld pt & left a message to adv of time of the appt for 8/14

## 2013-11-10 NOTE — Progress Notes (Signed)
   Department of Radiation Oncology  Phone:  [phone] Fax:        [phone]   Name: Denise Becker MRN: [id number]  DOB: Nov 18, 1950  Date: 11/10/2013  Follow Up Visit Note  Diagnosis:    ICD-9-CM  1. Breast cancer of upper-outer quadrant of right female breast 174.4   Interval History: Denise Becker presents today for routine followup.  She underwent neoadjuvant chemotherapy but unfortunately did not have much of a response. She had her right mastectomy on 7/28 which showed invasive locular carcinoma measuring 7.2 cm. Margins were negative as was 1 sentinel node.  She has healed well from her surgery. She has one drain left which she will have removed next week. Her only complaint is of bilateral lower extremity edema and pain. Her legs have been swollen since surgery.  She feels they are hot and painful. She denies any shortness of breath. She had not seen physical therapy.   Allergies:  Allergies  Allergen Reactions  . Ciprofloxacin     Patient developed severe diarrhea on cipro    Medications:  Current Outpatient Prescriptions  Medication Sig Dispense Refill  . amLODipine-olmesartan (AZOR) 10-40 MG per tablet Take 1 tablet by mouth every morning.      . Multiple Vitamin (MULTIVITAMIN) capsule Take 1 capsule by mouth daily.      . naproxen sodium (ANAPROX) 220 MG tablet Take 220 mg by mouth 2 (two) times daily as needed.      . tobramycin-dexamethasone (TOBRADEX) ophthalmic solution Place 1 drop into both eyes 2 (two) times daily.  5 mL  1   No current facility-administered medications for this encounter.    Physical Exam:  Bilateral non pitting edema with erythema of her lower extremities. Mastectomy scar healing well. No drainage. Drain in place.   IMPRESSION: Denise Becker is a 63 y.o. female s/p mastectomy with a 7 cm tumor and negative margins.   PLAN:  First, I called Dr. Virgie Dad PA who can see her tomorrow morning in regards to the edema issue. Next we discussed  the role of radiation in women with tumors over 5 cm after mastectomy. I spoke to the patient today regarding her diagnosis and options for treatment. We discussed the equivalence in terms of survival and local failure between mastectomy and breast conservation. We discussed the role of radiation in decreasing local failures in patients who undergo lumpectomy. We discussed the process of simulation and the placement tattoos. We discussed 6 weeks of treatment as an outpatient. We discussed the possibility of asymptomatic lung damage. We discussed the low likelihood of secondary malignancies. We discussed the possible side effects including but not limited to skin redness, fatigue, permanent skin darkening, and chest wall swelling. We discussed increased complications that can occur with reconstruction after radiation. I will schedule her for simulation at the end of the month.  She hopefully will see physical therapy after being released by Dr. Dalbert Batman next week.     Thea Silversmith, MD

## 2013-11-11 ENCOUNTER — Telehealth: Payer: Self-pay | Admitting: Oncology

## 2013-11-11 ENCOUNTER — Encounter: Payer: Self-pay | Admitting: Nurse Practitioner

## 2013-11-11 ENCOUNTER — Ambulatory Visit (HOSPITAL_BASED_OUTPATIENT_CLINIC_OR_DEPARTMENT_OTHER): Payer: 59 | Admitting: Nurse Practitioner

## 2013-11-11 VITALS — BP 138/76 | HR 79 | Temp 97.7°F | Resp 18 | Ht 61.0 in | Wt 210.5 lb

## 2013-11-11 DIAGNOSIS — L03119 Cellulitis of unspecified part of limb: Secondary | ICD-10-CM

## 2013-11-11 DIAGNOSIS — L03116 Cellulitis of left lower limb: Principal | ICD-10-CM

## 2013-11-11 DIAGNOSIS — Z170 Estrogen receptor positive status [ER+]: Secondary | ICD-10-CM

## 2013-11-11 DIAGNOSIS — L03115 Cellulitis of right lower limb: Secondary | ICD-10-CM | POA: Insufficient documentation

## 2013-11-11 DIAGNOSIS — C50419 Malignant neoplasm of upper-outer quadrant of unspecified female breast: Secondary | ICD-10-CM

## 2013-11-11 DIAGNOSIS — L02419 Cutaneous abscess of limb, unspecified: Secondary | ICD-10-CM

## 2013-11-11 DIAGNOSIS — C50411 Malignant neoplasm of upper-outer quadrant of right female breast: Secondary | ICD-10-CM

## 2013-11-11 MED ORDER — CEPHALEXIN 500 MG PO CAPS: 500.0000 mg | ORAL_CAPSULE | Freq: Four times a day (QID) | ORAL | Status: DC

## 2013-11-11 NOTE — Telephone Encounter (Signed)
, °

## 2013-11-11 NOTE — Progress Notes (Signed)
ID: [name] OB: 1950-07-20  MR#: [id number]  CSN#:[id number]  PCP: Joya Gaskins GYN:  Lahoma Crocker, MD SU: Fanny Skates, MD OTHER MD: Thea Silversmith, MD  CHIEF COMPLAINT: Right breast cancer THERAPY: Radiation  HISTORY OF PRESENT ILLNESS: From the initial intake note 03/16/2013:  Hoyle Sauer palpated a change in her right breast sometime in August or September 2014. She was aware that this might be significant, but waited on meds until she saw her gynecologist for a routine visit. Dr. Delsa Sale immediately he set her up for right mammography and ultrasonography performed 03/03/2013. Mammography found an area of density in the upper outer quadrant of the right breast measuring 5 cm in the with right nipple retraction. There was no evidence of skin thickening. On exam there was a firm palpable mass in the superior right breast extending from 1:00 to 9:00. The right nipple was completely retracted. There was no skin thickening. The right axilla was benign by palpation. Ultrasound showed a prominent irregular hypoechoic mass larger than the ultrasound screen. Ultrasound of the right axilla showed 2 adjacent small lymph nodes with diffusely thickened cortices, the largest measuring 1.1 cm.  On 03/04/2013 the patient underwent biopsy of the right breast mass and low right axillary lymph node, with the pathology ([id number]) showing, in the breast, and invasive ductal carcinoma, grade 3, 100% estrogen receptor positive, and her percent progesterone receptor positive, both with strong staining intensity, and a proliferation marker of 20%. There was no HER-2 amplification with a ratio of 1.46 by CISH and an average copy number of 2.85.  On 03/11/2013 the patient underwent bilateral breast MRI. This showed an irregular enhancing mass in the central to upper right breast with nipple retraction measuring in total 6.8 cm. There were several adjacent enhancing nodules as well. There  was no involvement of the pectoralis muscle. There were no definite morphologically abnormal right axillary lymph nodes there was no definite internal mammary or lymphadenopathy. Left breast was unremarkable.  The patient's subsequent history is as detailed below   INTERVAL HISTORY: Letizia is being seen today as an add-on patient. Dr. Pablo Ledger called yesterday after noting the patient's bilateral legs were swollen. She asked if she could be seen today in this clinic for evaluation. Dezyre noticed her legs becoming more swollen and discolored from the ankle up for the past 2 weeks. Now she complains of tightness, and pain usually at night. She has been taking aleve to relieve these symptoms. It has only helped marginally. It does not impair her ability to walk or drive.    REVIEW OF SYSTEMS: A detailed review of systems today was otherwise noncontributory  PAST MEDICAL HISTORY: Past Medical History  Diagnosis Date  . Hypertension   . Anxiety     anxiousabout impending surgery- loss of breast  . H/O hiatal hernia   . Spontaneous dislocation of shoulder [phone]    "right; a few times over the years"  . GERD (gastroesophageal reflux disease)     "just chemo related"  . Arthritis     "joints hurt q now and then" (10/25/2013)  . Depression     history of depression after son died in the 2000's  . Breast cancer of upper-outer quadrant of right female breast 03/10/2013    PAST SURGICAL HISTORY: Past Surgical History  Procedure Laterality Date  . Multiple tooth extractions Bilateral 2011-2014 X 6  . Tonsillectomy    . Cesarean section  1980  . Portacath placement Right 04/15/2013  Procedure: INSERTION PORT-A-CATH;  Surgeon: Adin Hector, MD;  Location: [place];  Service: General;  Laterality: Right;  . Port a cath revision Right 04/29/2013    Procedure: PORT A CATH REVISION;  Surgeon: Adin Hector, MD;  Location: [place];  Service:  General;  Laterality: Right;  . Mastectomy complete / simple w/ sentinel node biopsy Right 10/25/2013  . Port-a-cath removal  10/25/2013  . Wisdom tooth extraction    . Abdominal hysterectomy  1990's  . Breast biopsy Right 02/2013  . Mastectomy w/ sentinel node biopsy Right 10/25/2013    Procedure: RIGHT TOTAL MASTECTOMY WITH SENTINEL LYMPH NODE BIOPSY;  Surgeon: Adin Hector, MD;  Location: [place];  Service: General;  Laterality: Right;  . Port-a-cath removal N/A 10/25/2013    Procedure: REMOVAL PORT-A-CATH;  Surgeon: Adin Hector, MD;  Location: [place];  Service: General;  Laterality: N/A;    FAMILY HISTORY Family History  Problem Relation Age of Onset  . Cancer Mother   . Hypertension Mother   . Bowel Disease Mother   . Lung disease Father   . Heart disease Maternal Grandmother   . Heart disease Maternal Grandfather   the patient's mother,Flowrene Millings, had a lung mass but apparently died from unrelated causes (she was followed by Dr. Earlie Server here). She was 64 years old. The patient has little information about her father. The patient had 2 brothers, no sisters. There is no history of breast or ovarian cancer in the family to her knowledge.   GYNECOLOGIC HISTORY:   (Reviewed 08/23/2013) Menarche age 72, first live birth age 63, the patient is GX P1. She went through the change of life approximately 1999. She did not take hormone replacement.  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 She is on disability throughout her treatment. She is single, lives alone with her [place]. The patient's son Helen Hashimoto died at the age of 58 with acute leukemia. The patient has no grandchildren.    ADVANCED DIRECTIVES: Not in place, but the patient was given the appropriate documents to complete and notarize on her 03/16/2013 visit. She intends to name her cousin, Sharrie Rothman, as her healthcare power of  attorney. She can be reached at [place]:  (Updated 08/23/2013) History  Substance Use Topics  . Smoking status: Former Smoker -- 3 years    Types: Cigarettes    Quit date: 04/12/2001  . Smokeless tobacco: Never Used     Comment: "casual smoker; pack would go stale on me"  . Alcohol Use: Yes     Comment: 10/25/2013 "might have a glass of wine a few times/year"     Colonoscopy: Not on file  PAP: UTD/Dr. Delsa Sale  Bone density:  Never  Lipid panel:  Dec 2014/Dr. Jackson-Moore   Allergies  Allergen Reactions  . Ciprofloxacin     Patient developed severe diarrhea on cipro    Current Outpatient Prescriptions  Medication Sig Dispense Refill  . amLODipine-olmesartan (AZOR) 10-40 MG per tablet Take 1 tablet by mouth every morning.      . Multiple Vitamin (MULTIVITAMIN) capsule Take 1 capsule by mouth daily.      . naproxen sodium (ANAPROX) 220 MG tablet Take 220 mg by mouth 2 (two) times daily as needed.      . cephALEXin (KEFLEX) 500 MG capsule Take 1 capsule (500 mg total) by mouth 4 (four) times  daily.  28 capsule  0  . tobramycin-dexamethasone (TOBRADEX) ophthalmic solution Place 1 drop into both eyes 2 (two) times daily.  5 mL  1   No current facility-administered medications for this visit.   Objective: Middle-aged [place] American woman who appears stated age 86 Vitals:   11/11/13 0929  BP: 138/76  Pulse: 79  Temp: 97.7 F (36.5 C)  Resp: 18   Body mass index is 39.79 kg/(m^2).  ECOG: 1  Skin: bilateral lower legs tight, cracked, dry, discolored and erythematous. The erythema is darkest at ankle, lightens up towards the calf, skin above knee appropriate for race and not affected.  HEENT: sclerae anicteric, conjunctivae pink, oropharynx clear. No thrush or mucositis.  Lymph Nodes: No cervical or supraclavicular lymphadenopathy  Lungs: clear to auscultation bilaterally, no rales, wheezes, or rhonci  Heart: regular rate and rhythm   Abdomen: deferred Musculoskeletal: No focal spinal tenderness, 2+ non-pitting edema in bilateral lower legs and ankles, ROM intact, strength 5/5 in bilateral lower extremities  Neuro: non focal, well oriented, positive affect  Breasts: deferred   LAB RESULTS:   Lab Results  Component Value Date   WBC 8.4 10/25/2013   NEUTROABS 2.2 10/17/2013   HGB 12.0 10/25/2013   HCT 36.9 10/25/2013   MCV 84.1 10/25/2013   PLT 309 10/25/2013      Chemistry      Component Value Date/Time   NA 141 10/17/2013 1047   NA 144 08/30/2013 1022   K 4.1 10/17/2013 1047   K 3.8 08/30/2013 1022   CL 104 10/17/2013 1047   CO2 23 10/17/2013 1047   CO2 20* 08/30/2013 1022   BUN 15 10/17/2013 1047   BUN 11.2 08/30/2013 1022   CREATININE 0.65 10/25/2013 1530   CREATININE 0.8 08/30/2013 1022   CREATININE 0.75 03/02/2013 1135      Component Value Date/Time   CALCIUM 9.6 10/17/2013 1047   CALCIUM 9.1 08/30/2013 1022   ALKPHOS 102 10/17/2013 1047   ALKPHOS 76 08/30/2013 1022   AST 21 10/17/2013 1047   AST 13 08/30/2013 1022   ALT 27 10/17/2013 1047   ALT 12 08/30/2013 1022   BILITOT 0.3 10/17/2013 1047   BILITOT 0.44 08/30/2013 1022       STUDIES: Ct Abdomen Pelvis W Contrast  08/26/2013   CLINICAL DATA:  Right breast cancer with ongoing chemotherapy. Enlarging umbilical hernia with increased abdominal fullness and distention.  EXAM: CT ABDOMEN AND PELVIS WITH CONTRAST  TECHNIQUE: Multidetector CT imaging of the abdomen and pelvis was performed using the standard protocol following bolus administration of intravenous contrast.  CONTRAST:  140m OMNIPAQUE IOHEXOL 300 MG/ML  SOLN  COMPARISON:  PET 03/30/2013.  FINDINGS: Lung bases show no acute findings. Heart is mildly enlarged. No pericardial or pleural effusion.  Liver, gallbladder, adrenal glands and right kidney are unremarkable. Low-attenuation lesions in the left kidney measure up to 10 mm and are too small to definitively characterize. Statistically, cysts are most likely.  Spleen, pancreas and stomach are unremarkable. There is a large midline ventral hernia measuring approximately 10.5 x 22.1 x 19.4 cm. The neck measures 3.9 x 4.4 cm. There is herniation of unobstructed small bowel and colon. Small bowel and colon are otherwise unremarkable.  Hysterectomy. Bladder is unremarkable. Bilateral inguinal hernias contain fat. Atherosclerotic calcification of the arterial vasculature without abdominal aortic aneurysm. No pathologically enlarged lymph nodes. No free fluid. No worrisome lytic or sclerotic lesions. Degenerative changes are seen in the spine.  IMPRESSION: 1. No evidence  of metastatic disease in the abdomen or pelvis. 2. Large midline ventral hernia contains small bowel and colon. 3. Small bilateral inguinal hernias contain fat.   Electronically Signed   By: Lorin Picket M.D.   On: 08/26/2013 13:51    ASSESSMENT: 63 y.o. [place] woman   (1)  status post right breast biopsy 03/04/2013 for a clinical T3 N1, stage IIIA invasive ductal carcinoma, grade 3, estrogen receptor 100% positive, progesterone receptor 100% positive, with an MIB-1 of 20% and no HER-2 amplification.  (2) right axillary lymph node biopsy 03/04/2013 was negative  (3) neoadjuvant chemotherapy with docetaxel doxorubicin and cyclophosphamide x6 cycles completed 08/23/2013  (4) status post right total mastectomy and right axillary sentinel node biopsy, ypT3 ypN0 7.2cm invasive lobular carcinoma, ER +, PR +, HER-2/neu negative, margins clear  (5) adjuvant radiation to follow surgery  (6) antiestrogen therapy to follow radiation  (7) large ventral hernia--surgical evaluation pending  PLAN:  I agree with Dr. Unknown Jim assessment of bilateral lower extremity cellulitis. I discussed with Ayane the pathology of this complication and treatment using antibiotics. Her labs were reviewed from last month and were stable, her only allergy is to ciprofloxacin. I have prescribed for her 541m  cephalexin PO q6hrs x 7 days. I advised her to keep her feet elevated when not walking or driving, to stay hydrated, and to take her antibiotic with food to prevent GI upset. She will see me in office next week for a follow up evaluation. She has been advised to call if the cellulitis does not begin to resolve with in the next 72 hours or if any issues arise before her next visit. CSteviunderstands and agrees with this plan.   FMarcelino Duster NP  Medical Oncology   11/11/2013 10:56 AM

## 2013-11-14 ENCOUNTER — Ambulatory Visit: Payer: 59 | Admitting: Nurse Practitioner

## 2013-11-15 ENCOUNTER — Ambulatory Visit (INDEPENDENT_AMBULATORY_CARE_PROVIDER_SITE_OTHER): Payer: 59 | Admitting: General Surgery

## 2013-11-15 ENCOUNTER — Other Ambulatory Visit (INDEPENDENT_AMBULATORY_CARE_PROVIDER_SITE_OTHER): Payer: Self-pay

## 2013-11-15 ENCOUNTER — Encounter (INDEPENDENT_AMBULATORY_CARE_PROVIDER_SITE_OTHER): Payer: Self-pay | Admitting: General Surgery

## 2013-11-15 VITALS — BP 126/78 | HR 73 | Temp 97.0°F | Ht 64.0 in | Wt 209.0 lb

## 2013-11-15 DIAGNOSIS — D0591 Unspecified type of carcinoma in situ of right breast: Secondary | ICD-10-CM

## 2013-11-15 DIAGNOSIS — C50411 Malignant neoplasm of upper-outer quadrant of right female breast: Secondary | ICD-10-CM

## 2013-11-15 DIAGNOSIS — C50419 Malignant neoplasm of upper-outer quadrant of unspecified female breast: Secondary | ICD-10-CM

## 2013-11-15 NOTE — Progress Notes (Signed)
Patient ID: Denise Becker, female   DOB: 01/03/1951, 63 y.o.   MRN: [id number]   History:  This patient returns for another postop visit  She was initially diagnosed with locally advanced cancer of the right breast, central location, involving the nipple in December 2014. She was clinically node negative, receptor positive and HER-2/neu negative. She underwent neoadjuvant chemotherapy the tumor did feel smaller and softer but was still significant in size.  On 10/25/2013 she underwent right total mastectomy, right axillary sentinel node biopsy, and removal of Port-A-Cath.  Final pathology report showed invasive lobular carcinoma, 7.2 cm diameter, involving nipple, negative margins, negative sentinel nodes.  She is feeling well. She says she has no pain.  The remaining drain is down to about 20 cc per day. She has seen Dr. Pablo Ledger. She is scheduled to see Dr. Jana Hakim next week.   Exam:  Patient looks well. Friendly and cooperative his usual.  Right mastectomy incision is healing without any sign of retained fluid hematoma or infection. Skin edges are healthy. Drainage is serosanguineous. Remaining drain was removed. Range of motion right shoulder is limited to about 130 degrees.  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  Doing well in the early postoperative following right total mastectomy and sentinel lymph biopsy  Pathologic stage ypT3, ypN0.   Plan:  Referral made to physical therapy for range of motion right shoulder Wound care and activities discussed. Prescription for postmastectomy bra and prosthesis given the patient We talked about reconstruction options. Return to see me in 2-3 weeks. Keep appt. with Dr. Jana Hakim on August 24. I advised her that he will discuss the role antiestrogen therapy with her.  She is scheduled for simulation for chest wall radiation at the end of August.     Petros Ahart M.  Dalbert Batman, M.D., [facility]  General and Minimally invasive Surgery  Breast and Colorectal Surgery  Office: [phone]  Pager: [phone]

## 2013-11-15 NOTE — Patient Instructions (Signed)
You are recovering from your right mastectomy without any obvious surgical complications. The last drain was removed today.  You may take a shower starting tomorrow. Do not take a tub bath or get in a swimming pool yet.  Return to see Dr. Dalbert Batman in 3 weeks  Keep your appointment with Dr. Jana Hakim on August 24  You will be referred to physical therapy to improve range of motion of your right shoulder.  You have been given a prescription for a postmastectomy bra and prosthesis. Take the prescription to the [facility] [employer] [facility] store on [address] and they will help you.

## 2013-11-18 ENCOUNTER — Ambulatory Visit (HOSPITAL_BASED_OUTPATIENT_CLINIC_OR_DEPARTMENT_OTHER): Payer: 59 | Admitting: Nurse Practitioner

## 2013-11-18 ENCOUNTER — Encounter: Payer: Self-pay | Admitting: Nurse Practitioner

## 2013-11-18 VITALS — BP 139/77 | HR 86 | Temp 98.0°F | Resp 20 | Ht 64.0 in | Wt 209.1 lb

## 2013-11-18 DIAGNOSIS — L02419 Cutaneous abscess of limb, unspecified: Secondary | ICD-10-CM

## 2013-11-18 DIAGNOSIS — Z170 Estrogen receptor positive status [ER+]: Secondary | ICD-10-CM

## 2013-11-18 DIAGNOSIS — L03119 Cellulitis of unspecified part of limb: Secondary | ICD-10-CM

## 2013-11-18 DIAGNOSIS — C50411 Malignant neoplasm of upper-outer quadrant of right female breast: Secondary | ICD-10-CM

## 2013-11-18 DIAGNOSIS — C50419 Malignant neoplasm of upper-outer quadrant of unspecified female breast: Secondary | ICD-10-CM

## 2013-11-18 DIAGNOSIS — L03116 Cellulitis of left lower limb: Principal | ICD-10-CM

## 2013-11-18 DIAGNOSIS — L03115 Cellulitis of right lower limb: Secondary | ICD-10-CM

## 2013-11-18 MED ORDER — CEPHALEXIN 500 MG PO CAPS: 500.0000 mg | ORAL_CAPSULE | Freq: Four times a day (QID) | ORAL | Status: DC

## 2013-11-18 NOTE — Progress Notes (Signed)
ID: [name] OB: 05/08/1950  MR#: [id number]  IWP#:[id number]  PCP: Joya Gaskins GYN:  Lahoma Crocker, MD SU: Fanny Skates, MD OTHER MD: Thea Silversmith, MD  CHIEF COMPLAINT: Right breast cancer THERAPY: Radiation  HISTORY OF PRESENT ILLNESS: From the initial intake note 03/16/2013:  Denise Becker palpated a change in her right breast sometime in August or September 2014. She was aware that this might be significant, but waited on meds until she saw her gynecologist for a routine visit. Dr. Delsa Sale immediately he set her up for right mammography and ultrasonography performed 03/03/2013. Mammography found an area of density in the upper outer quadrant of the right breast measuring 5 cm in the with right nipple retraction. There was no evidence of skin thickening. On exam there was a firm palpable mass in the superior right breast extending from 1:00 to 9:00. The right nipple was completely retracted. There was no skin thickening. The right axilla was benign by palpation. Ultrasound showed a prominent irregular hypoechoic mass larger than the ultrasound screen. Ultrasound of the right axilla showed 2 adjacent small lymph nodes with diffusely thickened cortices, the largest measuring 1.1 cm.  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 There was no HER-2 amplification with a ratio of 1.46 by CISH and an average copy number of 2.85.  On 03/11/2013 the patient underwent bilateral breast MRI. This showed an irregular enhancing mass in the central to upper right breast with nipple retraction measuring in total 6.8 cm. There were several adjacent enhancing nodules as well. There  was no involvement of the pectoralis muscle. There were no definite morphologically abnormal right axillary lymph nodes there was no definite internal mammary or lymphadenopathy. Left breast was unremarkable.  The patient's subsequent history is as detailed below   INTERVAL HISTORY: Denise Becker returns for follow up of her bilateral leg cellulitis. She has been on 53m cephalexin QID for 1 week.The swelling has decreased and her normal color is returning to the lower legs. They are still tight but she says the pain is less intense.   REVIEW OF SYSTEMS: A detailed review of systems today was otherwise noncontributory  PAST MEDICAL HISTORY: Past Medical History  Diagnosis Date  . Hypertension   . Anxiety     anxiousabout impending surgery- loss of breast  . H/O hiatal hernia   . Spontaneous dislocation of shoulder [phone]   "right; a few times over the years"  . GERD (gastroesophageal reflux disease)     "just chemo related"  . Arthritis     "joints hurt q now and then" (10/25/2013)  . Depression     history of depression after son died in the 2000's  . Breast cancer of upper-outer quadrant of right female breast 03/10/2013    PAST SURGICAL HISTORY: Past Surgical History  Procedure Laterality Date  . Multiple tooth extractions Bilateral 2011-2014 X 6  . Tonsillectomy    . Cesarean section  1980  . Portacath placement Right 04/15/2013    Procedure: INSERTION PORT-A-CATH;  Surgeon: HAdin Hector MD;  [place] [facility]  Service: General;  Laterality: Right;  . Port a cath revision Right 04/29/2013    Procedure: PORT A CATH REVISION;  Surgeon: HEdsel Petrin  Dalbert Batman, MD;  Location: [facility];  Service: General;  Laterality: Right;  . Mastectomy complete / simple w/ sentinel node biopsy Right 10/25/2013  . Port-a-cath removal  10/25/2013  . Wisdom tooth extraction    . Abdominal hysterectomy  1990's  . Breast biopsy Right 02/2013  . Mastectomy w/  sentinel node biopsy Right 10/25/2013    Procedure: RIGHT TOTAL MASTECTOMY WITH SENTINEL LYMPH NODE BIOPSY;  Surgeon: Adin Hector, MD;  Location: [facility];  Service: General;  Laterality: Right;  . Port-a-cath removal N/A 10/25/2013    Procedure: REMOVAL PORT-A-CATH;  Surgeon: Adin Hector, MD;  Location: [facility];  Service: General;  Laterality: N/A;    FAMILY HISTORY Family History  Problem Relation Age of Onset  . Cancer Mother   . Hypertension Mother   . Bowel Disease Mother   . Lung disease Father   . Heart disease Maternal Grandmother   . Heart disease Maternal Grandfather   the patient's mother,Flowrene Millings, had a lung mass but apparently died from unrelated causes (she was followed by Dr. Earlie Server here). She was 64 years old. The patient has little information about her father. The patient had 2 brothers, no sisters. There is no history of breast or ovarian cancer in the family to her knowledge.   GYNECOLOGIC HISTORY:   (Reviewed 08/23/2013) Menarche age 44, first live birth age 44, the patient is GX P1. She went through the change of life approximately 1999. She did not take hormone replacement.  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 She is on disability throughout her treatment. She is single, lives alone with her [place]. The patient's son Helen Hashimoto died at the age of 56 with acute leukemia. The patient has no grandchildren.    ADVANCED DIRECTIVES: Not in place, but the patient was given the appropriate documents to complete and notarize on her 03/16/2013 visit. She intends to name her cousin, Sharrie Rothman, as her healthcare power of attorney. She can be reached at [place]:  (Updated 08/23/2013) History  Substance Use Topics  . Smoking status: Former Smoker -- 3 years    Types: Cigarettes    Quit date: 04/12/2001  . Smokeless tobacco: Never Used      Comment: "casual smoker; pack would go stale on me"  . Alcohol Use: Yes     Comment: 10/25/2013 "might have a glass of wine a few times/year"     Colonoscopy: Not on file  PAP: UTD/Dr. Delsa Sale  Bone density:  Never  Lipid panel:  Dec 2014/Dr. Jackson-Moore   Allergies  Allergen Reactions  . Ciprofloxacin     Patient developed severe diarrhea on cipro    Current Outpatient Prescriptions  Medication Sig Dispense Refill  . amLODipine-olmesartan (AZOR) 10-40 MG per tablet Take 1 tablet by mouth every morning.      . cephALEXin (KEFLEX) 500 MG capsule Take 1 capsule (500 mg total) by mouth 4 (four) times daily.  16 capsule  0  . Multiple Vitamin (MULTIVITAMIN) capsule Take 1 capsule by mouth daily.      . naproxen sodium (ANAPROX) 220 MG tablet Take 220 mg by mouth 2 (two) times daily as needed.      . tobramycin-dexamethasone (TOBRADEX) ophthalmic solution Place 1 drop into both eyes 2 (two) times daily.  5 mL  1   No current facility-administered medications for this visit.   Objective: Middle-aged African  American Becker who appears stated age 63 Vitals:   11/18/13 1037  BP: 139/77  Pulse: 86  Temp: 98 F (36.7 C)  Resp: 20   Body mass index is 35.87 kg/(m^2).  ECOG: 1  Skin: bilateral lower legs discolored but resolving, dryness is fading Sclerae unicteric, pupils equal and reactive Oropharynx clear and moist-- no thrush No cervical or supraclavicular adenopathy Lungs no rales or rhonchi Heart regular rate and rhythm Abd soft, nontender, positive bowel sounds MSK no focal spinal tenderness, 2+ non-pitting edema in bilateral lower legs and ankles resolving, ROM intact Neuro: nonfocal, well oriented, appropriate affect Breasts: deferred   LAB RESULTS:   Lab Results  Component Value Date   WBC 8.4 10/25/2013   NEUTROABS 2.2 10/17/2013   HGB 12.0 10/25/2013   HCT 36.9 10/25/2013   MCV 84.1 10/25/2013   PLT 309 10/25/2013      Chemistry      Component  Value Date/Time   NA 141 10/17/2013 1047   NA 144 08/30/2013 1022   K 4.1 10/17/2013 1047   K 3.8 08/30/2013 1022   CL 104 10/17/2013 1047   CO2 23 10/17/2013 1047   CO2 20* 08/30/2013 1022   BUN 15 10/17/2013 1047   BUN 11.2 08/30/2013 1022   CREATININE 0.65 10/25/2013 1530   CREATININE 0.8 08/30/2013 1022   CREATININE 0.75 03/02/2013 1135      Component Value Date/Time   CALCIUM 9.6 10/17/2013 1047   CALCIUM 9.1 08/30/2013 1022   ALKPHOS 102 10/17/2013 1047   ALKPHOS 76 08/30/2013 1022   AST 21 10/17/2013 1047   AST 13 08/30/2013 1022   ALT 27 10/17/2013 1047   ALT 12 08/30/2013 1022   BILITOT 0.3 10/17/2013 1047   BILITOT 0.44 08/30/2013 1022       STUDIES: Ct Abdomen Pelvis W Contrast  08/26/2013   CLINICAL DATA:  Right breast cancer with ongoing chemotherapy. Enlarging umbilical hernia with increased abdominal fullness and distention.  EXAM: CT ABDOMEN AND PELVIS WITH CONTRAST  TECHNIQUE: Multidetector CT imaging of the abdomen and pelvis was performed using the standard protocol following bolus administration of intravenous contrast.  CONTRAST:  126m OMNIPAQUE IOHEXOL 300 MG/ML  SOLN  COMPARISON:  PET 03/30/2013.  FINDINGS: Lung bases show no acute findings. Heart is mildly enlarged. No pericardial or pleural effusion.  Liver, gallbladder, adrenal glands and right kidney are unremarkable. Low-attenuation lesions in the left kidney measure up to 10 mm and are too small to definitively characterize. Statistically, cysts are most likely. Spleen, pancreas and stomach are unremarkable. There is a large midline ventral hernia measuring approximately 10.5 x 22.1 x 19.4 cm. The neck measures 3.9 x 4.4 cm. There is herniation of unobstructed small bowel and colon. Small bowel and colon are otherwise unremarkable.  Hysterectomy. Bladder is unremarkable. Bilateral inguinal hernias contain fat. Atherosclerotic calcification of the arterial vasculature without abdominal aortic aneurysm. No pathologically enlarged lymph  nodes. No free fluid. No worrisome lytic or sclerotic lesions. Degenerative changes are seen in the spine.  IMPRESSION: 1. No evidence of metastatic disease in the abdomen or pelvis. 2. Large midline ventral hernia contains small bowel and colon. 3. Small bilateral inguinal hernias contain fat.   Electronically Signed   By: MLorin PicketM.D.   On: 08/26/2013 13:51    ASSESSMENT: 63y.o. Denise Becker   (1)  status post right breast biopsy 03/04/2013 for a clinical T3 N1, stage IIIA invasive ductal carcinoma, grade 3, estrogen receptor 100% positive, progesterone  receptor 100% positive, with an MIB-1 of 20% and no HER-2 amplification.  (2) right axillary lymph node biopsy 03/04/2013 was negative  (3) neoadjuvant chemotherapy with docetaxel doxorubicin and cyclophosphamide x6 cycles completed 08/23/2013  (4) status post right total mastectomy and right axillary sentinel node biopsy, ypT3 ypN0 7.2cm invasive lobular carcinoma, ER +, PR +, HER-2/neu negative, margins clear  (5) adjuvant radiation to follow surgery  (6) antiestrogen therapy to follow radiation  (7) large ventral hernia--surgical evaluation pending  PLAN: Denise Becker's cellulitis is improving, though the swelling is still noted. I refilled her cephalexin for another 7 days. She was again advised to keep her legs elevated and to take the antibiotic with food to prevent GI upset. She is seeing Dr. Jana Hakim on Monday for a routine follow up and she will be sure to show him her legs then as well. Denise Becker understands and agrees with this plan. She knows to call with any issues that arise before next week.   Marcelino Duster, NP     11/18/2013 10:49 AM

## 2013-11-21 ENCOUNTER — Other Ambulatory Visit (HOSPITAL_BASED_OUTPATIENT_CLINIC_OR_DEPARTMENT_OTHER): Payer: 59

## 2013-11-21 ENCOUNTER — Telehealth: Payer: Self-pay | Admitting: Oncology

## 2013-11-21 ENCOUNTER — Other Ambulatory Visit: Payer: Self-pay | Admitting: *Deleted

## 2013-11-21 ENCOUNTER — Ambulatory Visit (HOSPITAL_BASED_OUTPATIENT_CLINIC_OR_DEPARTMENT_OTHER): Payer: 59 | Admitting: Oncology

## 2013-11-21 VITALS — BP 129/74 | HR 70 | Temp 98.4°F | Resp 18 | Ht 64.0 in | Wt 210.1 lb

## 2013-11-21 DIAGNOSIS — C50411 Malignant neoplasm of upper-outer quadrant of right female breast: Secondary | ICD-10-CM

## 2013-11-21 DIAGNOSIS — C50919 Malignant neoplasm of unspecified site of unspecified female breast: Secondary | ICD-10-CM

## 2013-11-21 DIAGNOSIS — Z170 Estrogen receptor positive status [ER+]: Secondary | ICD-10-CM

## 2013-11-21 DIAGNOSIS — K439 Ventral hernia without obstruction or gangrene: Secondary | ICD-10-CM

## 2013-11-21 LAB — COMPREHENSIVE METABOLIC PANEL (CC13)
ALBUMIN: 3.7 g/dL (ref 3.5–5.0)
ALT: 23 U/L (ref 0–55)
ANION GAP: 8 meq/L (ref 3–11)
AST: 20 U/L (ref 5–34)
Alkaline Phosphatase: 99 U/L (ref 40–150)
BUN: 17.5 mg/dL (ref 7.0–26.0)
CALCIUM: 9.8 mg/dL (ref 8.4–10.4)
CO2: 26 meq/L (ref 22–29)
Chloride: 108 mEq/L (ref 98–109)
Creatinine: 1.2 mg/dL — ABNORMAL HIGH (ref 0.6–1.1)
Glucose: 88 mg/dl (ref 70–140)
POTASSIUM: 3.8 meq/L (ref 3.5–5.1)
SODIUM: 142 meq/L (ref 136–145)
TOTAL PROTEIN: 7.6 g/dL (ref 6.4–8.3)
Total Bilirubin: 0.2 mg/dL (ref 0.20–1.20)

## 2013-11-21 LAB — CBC WITH DIFFERENTIAL/PLATELET
BASO%: 1.1 % (ref 0.0–2.0)
Basophils Absolute: 0.1 10*3/uL (ref 0.0–0.1)
EOS ABS: 0.3 10*3/uL (ref 0.0–0.5)
EOS%: 4.2 % (ref 0.0–7.0)
HCT: 42.5 % (ref 34.8–46.6)
HEMOGLOBIN: 13.4 g/dL (ref 11.6–15.9)
LYMPH%: 37.8 % (ref 14.0–49.7)
MCH: 26.3 pg (ref 25.1–34.0)
MCHC: 31.6 g/dL (ref 31.5–36.0)
MCV: 83.3 fL (ref 79.5–101.0)
MONO#: 0.8 10*3/uL (ref 0.1–0.9)
MONO%: 11.6 % (ref 0.0–14.0)
NEUT#: 3 10*3/uL (ref 1.5–6.5)
NEUT%: 45.3 % (ref 38.4–76.8)
PLATELETS: 286 10*3/uL (ref 145–400)
RBC: 5.11 10*6/uL (ref 3.70–5.45)
RDW: 15.2 % — AB (ref 11.2–14.5)
WBC: 6.6 10*3/uL (ref 3.9–10.3)
lymph#: 2.5 10*3/uL (ref 0.9–3.3)

## 2013-11-21 NOTE — Progress Notes (Signed)
ID: [name] OB: Dec 18, 1950  MR#: [id number]  TDD#:[id number]  PCP: Joya Gaskins GYN:  Lahoma Crocker, MD SU: Fanny Skates, MD OTHER MD: Thea Silversmith, MD  CHIEF COMPLAINT: Right breast cancer THERAPY: Neodjuvant chemotherapy   HISTORY OF PRESENT ILLNESS: From the initial intake note 03/16/2013:  Anzal palpated a change in her right breast sometime in August or September 2014. She was aware that this might be significant, but waited on meds until she saw her gynecologist for a routine visit. Dr. Delsa Sale immediately he set her up for right mammography and ultrasonography performed 03/03/2013. Mammography found an area of density in the upper outer quadrant of the right breast measuring 5 cm in the with right nipple retraction. There was no evidence of skin thickening. On exam there was a firm palpable mass in the superior right breast extending from 1:00 to 9:00. The right nipple was completely retracted. There was no skin thickening. The right axilla was benign by palpation. Ultrasound showed a prominent irregular hypoechoic mass larger than the ultrasound screen. Ultrasound of the right axilla showed 2 adjacent small lymph nodes with diffusely thickened cortices, the largest measuring 1.1 cm.  On 03/04/2013 the patient underwent biopsy of the right breast mass and low right axillary lymph node, with the pathology ([id number]) showing, in the breast, and invasive ductal carcinoma, grade 3, 100% estrogen receptor positive, and her percent progesterone receptor positive, both with strong staining intensity, and a proliferation marker of 20%. There was no HER-2 amplification with a ratio of 1.46 by CISH and an average copy number of 2.85.  On 03/11/2013 the patient underwent bilateral breast MRI. This showed an irregular enhancing mass in the central to upper right breast with nipple retraction measuring in total 6.8 cm. There were several adjacent enhancing nodules  as well. There was no involvement of the pectoralis muscle. There were no definite morphologically abnormal right axillary lymph nodes there was no definite internal mammary or lymphadenopathy. Left breast was unremarkable.  The patient's subsequent history is as detailed below   INTERVAL HISTORY: [name] returns today for followup of her locally advanced right breast cancer. Since her last visit here she completed her chemotherapy and proceeded to right mastectomy with sentinel lymph node sampling in 10/25/2013. The final pathology ([id number]) showed a 7.2 cm area of invasive lobular carcinoma, pleomorphic variant, with ample margins. Both sentinel lymph nodes were clear. Repeat HER-2 testing was again negative. Because the tumor directly extended into the nipple dermis and subcutaneous soft tissue, the final stage is pT3 pN0, or sage IIB.  REVIEW OF SYSTEMS: Denise Becker tolerated surgery well, with no unusual bleeding, fever, or pain. She is taking walks for exercise. She received some antibiotics for lower extremity cellulitis, related to chronic ankle swelling and venous stasis changes. That is a little bit better, but it may not resolve completely, which is something that distresses her. A detailed review of systems today was otherwise remarkably been on  PAST MEDICAL HISTORY: Past Medical History  Diagnosis Date  . Hypertension   . Anxiety     anxiousabout impending surgery- loss of breast  . H/O hiatal hernia   . Spontaneous dislocation of shoulder [phone]    "right; a few times over the years"  . GERD (gastroesophageal reflux disease)     "just chemo related"  . Arthritis     "joints hurt q now and then" (10/25/2013)  . Depression     history of depression after son died in the  2000's  . Breast cancer of upper-outer quadrant of right female breast 03/10/2013    PAST SURGICAL HISTORY: Past Surgical History  Procedure Laterality Date  . Multiple tooth extractions Bilateral  2011-2014 X 6  . Tonsillectomy    . Cesarean section  1980  . Portacath placement Right 04/15/2013    Procedure: INSERTION PORT-A-CATH;  Surgeon: Adin Hector, MD;  Location: [facility];  Service: General;  Laterality: Right;  . Port a cath revision Right 04/29/2013    Procedure: PORT A CATH REVISION;  Surgeon: Adin Hector, MD;  Location: [facility];  Service: General;  Laterality: Right;  . Mastectomy complete / simple w/ sentinel node biopsy Right 10/25/2013  . Port-a-cath removal  10/25/2013  . Wisdom tooth extraction    . Abdominal hysterectomy  1990's  . Breast biopsy Right 02/2013  . Mastectomy w/ sentinel node biopsy Right 10/25/2013    Procedure: RIGHT TOTAL MASTECTOMY WITH SENTINEL LYMPH NODE BIOPSY;  Surgeon: Adin Hector, MD;  Location: [facility];  Service: General;  Laterality: Right;  . Port-a-cath removal N/A 10/25/2013    Procedure: REMOVAL PORT-A-CATH;  Surgeon: Adin Hector, MD;  Location: [facility];  Service: General;  Laterality: N/A;    FAMILY HISTORY Family History  Problem Relation Age of Onset  . Cancer Mother   . Hypertension Mother   . Bowel Disease Mother   . Lung disease Father   . Heart disease Maternal Grandmother   . Heart disease Maternal Grandfather   the patient's mother,Flowrene Becker, had a lung mass but apparently died from unrelated causes (she was followed by Dr. Earlie Server here). She was 64 years old. The patient has little information about her father. The patient had 2 brothers, no sisters. There is no history of breast or ovarian cancer in the family to her knowledge.   GYNECOLOGIC HISTORY:   (Reviewed 08/23/2013) Menarche age 74, first live birth age 9, the patient is GX P1. She went through the change of life approximately 1999. She did not take hormone replacement.  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 She is on disability  throughout her treatment. She is single, lives alone with her [place]. The patient's son Denise Becker died at the age of 22 with acute leukemia. The patient has no grandchildren.    ADVANCED DIRECTIVES: Not in place, but the patient was given the appropriate documents to complete and notarize on her 03/16/2013 visit. She intends to name her cousin, Sharrie Rothman, as her healthcare power of attorney. She can be reached at [place]:  (Updated 08/23/2013) History  Substance Use Topics  . Smoking status: Former Smoker -- 3 years    Types: Cigarettes    Quit date: 04/12/2001  . Smokeless tobacco: Never Used     Comment: "casual smoker; pack would go stale on me"  . Alcohol Use: Yes     Comment: 10/25/2013 "might have a glass of wine a few times/year"     Colonoscopy: Not on file  PAP: UTD/Dr. Delsa Sale  Bone density:  Never  Lipid panel:  Dec 2014/Dr. Jackson-Moore   Allergies  Allergen Reactions  . Ciprofloxacin     Patient developed severe diarrhea on cipro    Current Outpatient Prescriptions  Medication Sig Dispense Refill  . amLODipine-olmesartan (AZOR) 10-40 MG per tablet Take 1 tablet by mouth every morning.      Marland Kitchen  cephALEXin (KEFLEX) 500 MG capsule Take 1 capsule (500 mg total) by mouth 4 (four) times daily.  28 capsule  0  . Multiple Vitamin (MULTIVITAMIN) capsule Take 1 capsule by mouth daily.      . naproxen sodium (ANAPROX) 220 MG tablet Take 220 mg by mouth 2 (two) times daily as needed.      . tobramycin-dexamethasone (TOBRADEX) ophthalmic solution Place 1 drop into both eyes 2 (two) times daily.  5 mL  1   No current facility-administered medications for this visit.   Objective: Middle-aged [place] American woman in no acute distress Filed Vitals:   11/21/13 1333  BP: 129/74  Pulse: 70  Temp: 98.4 F (36.9 C)  Resp: 18   Body mass index is 36.05 kg/(m^2).  ECOG: 1  Sclerae unicteric, pupils round and equal Oropharynx clear  and moist No cervical or supraclavicular adenopathy Lungs no rales or rhonchi Heart regular rate and rhythm Abd soft, obese, nontender, positive bowel sounds; abdominal binder in place over her large ventral hernia. MSK no focal spinal tenderness, no upper extremity lymphedema; brawny edema bilateral lower extremities as previously noted Neuro: nonfocal, well oriented, appropriate affect Breasts: The right breast is status post mastectomy. There is no dehiscence, erythema, or swelling. The right axilla is benign. The left breast is unremarkable   LAB RESULTS:   Lab Results  Component Value Date   WBC 6.6 11/21/2013   NEUTROABS 3.0 11/21/2013   HGB 13.4 11/21/2013   HCT 42.5 11/21/2013   MCV 83.3 11/21/2013   PLT 286 11/21/2013      Chemistry      Component Value Date/Time   NA 141 10/17/2013 1047   NA 144 08/30/2013 1022   K 4.1 10/17/2013 1047   K 3.8 08/30/2013 1022   CL 104 10/17/2013 1047   CO2 23 10/17/2013 1047   CO2 20* 08/30/2013 1022   BUN 15 10/17/2013 1047   BUN 11.2 08/30/2013 1022   CREATININE 0.65 10/25/2013 1530   CREATININE 0.8 08/30/2013 1022   CREATININE 0.75 03/02/2013 1135      Component Value Date/Time   CALCIUM 9.6 10/17/2013 1047   CALCIUM 9.1 08/30/2013 1022   ALKPHOS 102 10/17/2013 1047   ALKPHOS 76 08/30/2013 1022   AST 21 10/17/2013 1047   AST 13 08/30/2013 1022   ALT 27 10/17/2013 1047   ALT 12 08/30/2013 1022   BILITOT 0.3 10/17/2013 1047   BILITOT 0.44 08/30/2013 1022       STUDIES: Nm Sentinel Node Inj-no Rpt (melanoma)  10/25/2013   CLINICAL DATA: Cancer right breast   Sulfur colloid was injected by the nuclear medicine technologist for  melanoma sentinel node.     ASSESSMENT: 63 y.o. [place] woman   (1)  status post right breast biopsy 03/04/2013 for a clinical T3 N1, stage IIIA invasive ductal carcinoma, grade 3, estrogen receptor 100% positive, progesterone receptor 100% positive, with an MIB-1 of 20% and no HER-2 amplification.  (2) right axillary lymph  node biopsy 03/04/2013 was negative  (3) neoadjuvant chemotherapy with docetaxel doxorubicin and cyclophosphamide x6 cycles completed 08/23/2013  (4) status post right mastectomy and sentinel lymph node sampling 10/25/2013 for a residual pT3 pN0, stage IIB invasive lobular carcinoma, pleomorphic variant, with negative margins and repeat HER-2 again negative.  (5) adjuvant radiation to follow surgery  (6) antiestrogen therapy to follow radiation  (7) large ventral hernia--surgical evaluation pending  PLAN: Maryruth is recovering nicely from her surgery. She is now ready to start her  radiation treatments.  She was very distressed when I mentioned that the darkening of the skin around her lower legs may not resolve (it is not due to chemotherapy but more likely to Hemocyte her into position secondary to poor circulation). This is one of many changes in her body and it is difficult for her to accept that some of them may be permanent. She may benefit from the "finding your new normal"support group once she finishes her radiation treatments.  Today I wrote her a prescription for a postsurgical bra and 4 the procedures which she may start using once she recovers from the radiation treatments.  She will see our physicians assistant in 3 weeks to discuss the lower extremity swelling issue again. She will return to see me in approximately 2 months. At that point she will start the most important part of her anticancer treatment, which is also the cheapest and the easiest for her, namely a long-term antiestrogen pills. I emphasize that this is the most effective single intervention in terms of keeping her cancer from coming back. I expect she will be an antiestrogen for at least 5 years, more likely to.  She knows to call for any problems that may develop before the next visit here.  Chauncey Cruel, MD  Medical Oncology   11/21/2013 1:41 PM

## 2013-11-22 ENCOUNTER — Ambulatory Visit: Payer: 59 | Attending: General Surgery | Admitting: Physical Therapy

## 2013-11-22 DIAGNOSIS — Z5189 Encounter for other specified aftercare: Secondary | ICD-10-CM | POA: Insufficient documentation

## 2013-11-22 DIAGNOSIS — C50419 Malignant neoplasm of upper-outer quadrant of unspecified female breast: Secondary | ICD-10-CM | POA: Insufficient documentation

## 2013-11-22 DIAGNOSIS — Z170 Estrogen receptor positive status [ER+]: Secondary | ICD-10-CM | POA: Insufficient documentation

## 2013-11-23 ENCOUNTER — Ambulatory Visit: Payer: 59 | Admitting: Physical Therapy

## 2013-11-23 DIAGNOSIS — Z5189 Encounter for other specified aftercare: Secondary | ICD-10-CM | POA: Diagnosis not present

## 2013-11-29 ENCOUNTER — Ambulatory Visit
Admission: RE | Admit: 2013-11-29 | Discharge: 2013-11-29 | Disposition: A | Discharge status: Home / Self Care | Payer: 59 | Source: Ambulatory Visit | Attending: Radiation Oncology | Admitting: Radiation Oncology

## 2013-11-29 ENCOUNTER — Ambulatory Visit: Payer: 59 | Attending: General Surgery | Admitting: Physical Therapy

## 2013-11-29 DIAGNOSIS — C50419 Malignant neoplasm of upper-outer quadrant of unspecified female breast: Secondary | ICD-10-CM | POA: Diagnosis not present

## 2013-11-29 DIAGNOSIS — Z5189 Encounter for other specified aftercare: Secondary | ICD-10-CM | POA: Diagnosis present

## 2013-11-29 DIAGNOSIS — Z510 Encounter for antineoplastic radiation therapy: Secondary | ICD-10-CM | POA: Diagnosis not present

## 2013-11-29 DIAGNOSIS — Z170 Estrogen receptor positive status [ER+]: Secondary | ICD-10-CM | POA: Diagnosis not present

## 2013-11-29 DIAGNOSIS — C50411 Malignant neoplasm of upper-outer quadrant of right female breast: Secondary | ICD-10-CM

## 2013-11-29 NOTE — Progress Notes (Signed)
Name: Denise Becker   MRN: [id number]  Date:  11/29/2013  DOB: 1951-02-14  Status:outpatient    DIAGNOSIS: Breast cancer.  CONSENT VERIFIED: yes   SET UP: Patient is setup supine   IMMOBILIZATION:  The following immobilization was used:Custom Moldable Pillow, breast board.   NARRATIVE: [name] was brought to the [facility].  Identity was confirmed.  All relevant records and images related to the planned course of therapy were reviewed.  Then, the patient was positioned in a stable reproducible clinical set-up for radiation therapy.  Wires were placed to delineate the clinical extent of breast tissue. A wire was placed on the scar as well.  CT images were obtained.  An isocenter was placed. Skin markings were placed.  The CT images were loaded into the planning software where the target and avoidance structures were contoured.  The radiation prescription was entered and confirmed. The patient was discharged in stable condition and tolerated simulation well.    TREATMENT PLANNING NOTE:  Treatment planning then occurred. I have requested : MLC's, isodose plan, basic dose calculation  I personally designed and supervised the construction of 3 medically necessary complex treatment devices for the protection of critical normal structures including the lungs and contralateral breast as well as the immobilization device which is necessary for set up certainty.   3 D simulation was requested with DVH of the skin, heart and lungs.

## 2013-12-01 ENCOUNTER — Encounter: Payer: Self-pay | Admitting: Oncology

## 2013-12-01 DIAGNOSIS — Z510 Encounter for antineoplastic radiation therapy: Secondary | ICD-10-CM | POA: Diagnosis not present

## 2013-12-01 NOTE — Progress Notes (Signed)
Faxed form to [employer] @ [phone] °

## 2013-12-02 DIAGNOSIS — Z510 Encounter for antineoplastic radiation therapy: Secondary | ICD-10-CM | POA: Diagnosis not present

## 2013-12-06 ENCOUNTER — Ambulatory Visit (HOSPITAL_BASED_OUTPATIENT_CLINIC_OR_DEPARTMENT_OTHER): Payer: 59 | Admitting: Nurse Practitioner

## 2013-12-06 ENCOUNTER — Ambulatory Visit: Payer: 59 | Admitting: Physical Therapy

## 2013-12-06 ENCOUNTER — Ambulatory Visit: Payer: 59 | Admitting: Radiation Oncology

## 2013-12-06 ENCOUNTER — Encounter: Payer: Self-pay | Admitting: Nurse Practitioner

## 2013-12-06 VITALS — BP 136/71 | HR 84 | Temp 98.2°F | Resp 18 | Ht 64.0 in | Wt 209.7 lb

## 2013-12-06 DIAGNOSIS — C50411 Malignant neoplasm of upper-outer quadrant of right female breast: Secondary | ICD-10-CM

## 2013-12-06 DIAGNOSIS — L02419 Cutaneous abscess of limb, unspecified: Secondary | ICD-10-CM

## 2013-12-06 DIAGNOSIS — L03116 Cellulitis of left lower limb: Secondary | ICD-10-CM

## 2013-12-06 DIAGNOSIS — Z5189 Encounter for other specified aftercare: Secondary | ICD-10-CM | POA: Diagnosis not present

## 2013-12-06 DIAGNOSIS — C50419 Malignant neoplasm of upper-outer quadrant of unspecified female breast: Secondary | ICD-10-CM

## 2013-12-06 DIAGNOSIS — L03119 Cellulitis of unspecified part of limb: Secondary | ICD-10-CM

## 2013-12-06 DIAGNOSIS — L03115 Cellulitis of right lower limb: Secondary | ICD-10-CM

## 2013-12-06 NOTE — Progress Notes (Signed)
ID: [name] OB: 1950-06-10  MR#: [id number]  IZT#:[id number]  PCP: Joya Gaskins GYN:  Lahoma Crocker, MD SU: Fanny Skates, MD OTHER MD: Thea Silversmith, MD  CHIEF COMPLAINT: Right breast cancer THERAPY: Radiation  HISTORY OF PRESENT ILLNESS: From the initial intake note 03/16/2013:  Denise Becker palpated a change in her right breast sometime in August or September 2014. She was aware that this might be significant, but waited on meds until she saw her gynecologist for a routine visit. Dr. Delsa Sale immediately he set her up for right mammography and ultrasonography performed 03/03/2013. Mammography found an area of density in the upper outer quadrant of the right breast measuring 5 cm in the with right nipple retraction. There was no evidence of skin thickening. On exam there was a firm palpable mass in the superior right breast extending from 1:00 to 9:00. The right nipple was completely retracted. There was no skin thickening. The right axilla was benign by palpation. Ultrasound showed a prominent irregular hypoechoic mass larger than the ultrasound screen. Ultrasound of the right axilla showed 2 adjacent small lymph nodes with diffusely thickened cortices, the largest measuring 1.1 cm.  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 There was no HER-2 amplification with a ratio of 1.46 by CISH and an average copy number of 2.85.  On 03/11/2013 the patient underwent bilateral breast MRI. This showed an irregular enhancing mass in the central to upper right breast with nipple retraction measuring in total 6.8 cm. There were several adjacent enhancing nodules as well. There  was no involvement of the pectoralis muscle. There were no definite morphologically abnormal right axillary lymph nodes there was no definite internal mammary or lymphadenopathy. Left breast was unremarkable.  The patient's subsequent history is as detailed below   INTERVAL HISTORY: Denise Becker returns for follow up of her bilateral leg cellulitis. She was on 53m cephalexin QID for 14 days. The swelling has decreased, and the pain has almost completely resolved. The color is still hyperpigmented and this is very concerning for her, though the dryness is resolving.   REVIEW OF SYSTEMS: A detailed review of systems today was otherwise noncontributory  PAST MEDICAL HISTORY: Past Medical History  Diagnosis Date  . Hypertension   . Anxiety     anxiousabout impending surgery- loss of breast  . H/O hiatal hernia   . Spontaneous dislocation of shoulder [phone]   "right; a few times over the years"  . GERD (gastroesophageal reflux disease)     "just chemo related"  . Arthritis     "joints hurt q now and then" (10/25/2013)  . Depression     history of depression after son died in the 2000's  . Breast cancer of upper-outer quadrant of right female breast 03/10/2013    PAST SURGICAL HISTORY: Past Surgical History  Procedure Laterality Date  . Multiple tooth extractions Bilateral 2011-2014 X 6  . Tonsillectomy    . Cesarean section  1980  . Portacath placement Right 04/15/2013    Procedure: INSERTION PORT-A-CATH;  Surgeon: HAdin Hector MD;  Location: [facility]  Service: General;  Laterality: Right;  . Port a cath revision Right 04/29/2013    Procedure: PORT A CATH REVISION;  Surgeon:  Adin Hector, MD;  Location: [facility];  Service: General;  Laterality: Right;  . Mastectomy complete / simple w/ sentinel node biopsy Right 10/25/2013  . Port-a-cath removal  10/25/2013  . Wisdom tooth extraction    . Abdominal hysterectomy  1990's  . Breast biopsy  Right 02/2013  . Mastectomy w/ sentinel node biopsy Right 10/25/2013    Procedure: RIGHT TOTAL MASTECTOMY WITH SENTINEL LYMPH NODE BIOPSY;  Surgeon: Adin Hector, MD;  Location: [facility];  Service: General;  Laterality: Right;  . Port-a-cath removal N/A 10/25/2013    Procedure: REMOVAL PORT-A-CATH;  Surgeon: Adin Hector, MD;  Location: [facility];  Service: General;  Laterality: N/A;    FAMILY HISTORY Family History  Problem Relation Age of Onset  . Cancer Mother   . Hypertension Mother   . Bowel Disease Mother   . Lung disease Father   . Heart disease Maternal Grandmother   . Heart disease Maternal Grandfather   the patient's mother,Denise Becker, had a lung mass but apparently died from unrelated causes (she was followed by Dr. Earlie Server here). She was 64 years old. The patient has little information about her father. The patient had 2 brothers, no sisters. There is no history of breast or ovarian cancer in the family to her knowledge.   GYNECOLOGIC HISTORY:   (Reviewed 08/23/2013) Menarche age 71, first live birth age 79, the patient is GX P1. She went through the change of life approximately 1999. She did not take hormone replacement.  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 She is on disability throughout her treatment. She is single, lives alone with her [place]. The patient's son Denise Becker died at the age of 46 with acute leukemia. The patient has no grandchildren.    ADVANCED DIRECTIVES: Not in place, but the patient was given the appropriate documents to complete and notarize on her 03/16/2013 visit. She intends to name her cousin, Denise Becker, as her healthcare power of attorney. She can be reached at [place]:  (Updated 08/23/2013) History  Substance Use Topics  . Smoking status: Former Smoker -- 3 years    Types: Cigarettes    Quit date: 04/12/2001  .  Smokeless tobacco: Never Used     Comment: "casual smoker; pack would go stale on me"  . Alcohol Use: Yes     Comment: 10/25/2013 "might have a glass of wine a few times/year"     Colonoscopy: Not on file  PAP: UTD/Dr. Delsa Sale  Bone density:  Never  Lipid panel:  Dec 2014/Dr. Jackson-Moore   Allergies  Allergen Reactions  . Ciprofloxacin     Patient developed severe diarrhea on cipro    Current Outpatient Prescriptions  Medication Sig Dispense Refill  . amLODipine-olmesartan (AZOR) 10-40 MG per tablet Take 1 tablet by mouth every morning.      . Multiple Vitamin (MULTIVITAMIN) capsule Take 1 capsule by mouth daily.      Marland Kitchen tobramycin-dexamethasone (TOBRADEX) ophthalmic solution Place 1 drop into both eyes 2 (two) times daily.  5 mL  1  . cephALEXin (KEFLEX) 500 MG capsule Take 1 capsule (500 mg total) by mouth 4 (four) times daily.  28 capsule  0  . naproxen sodium (ANAPROX) 220 MG tablet Take 220 mg by mouth 2 (two) times daily as needed.       No current facility-administered medications for this visit.   Objective:  Middle-aged [place] American woman who appears stated age 18 Vitals:   12/06/13 1155  BP: 136/71  Pulse: 84  Temp: 98.2 F (36.8 C)  Resp: 18   Body mass index is 35.98 kg/(m^2).  ECOG: 1  Skin: warm, dry, hyperpigmented bilateral lower legs below knee HEENT: sclerae anicteric, conjunctivae pink, oropharynx clear. No thrush or mucositis.  Lymph Nodes: No cervical or supraclavicular lymphadenopathy  Lungs: clear to auscultation bilaterally, no rales, wheezes, or rhonci  Heart: regular rate and rhythm  Abdomen: round, soft, non tender, positive bowel sounds  Musculoskeletal: No focal spinal tenderness, 1+ non-pitting edema in bilateral lower legs and ankles - resolving Neuro: non focal, well oriented, positive affect  Breasts: right breast status post mastectomy, healing well, scar well approximated, clean dry and intact. Right axilla benign. Left  breast unremarkable.    LAB RESULTS:   Lab Results  Component Value Date   WBC 6.6 11/21/2013   NEUTROABS 3.0 11/21/2013   HGB 13.4 11/21/2013   HCT 42.5 11/21/2013   MCV 83.3 11/21/2013   PLT 286 11/21/2013      Chemistry      Component Value Date/Time   NA 142 11/21/2013 1323   NA 141 10/17/2013 1047   K 3.8 11/21/2013 1323   K 4.1 10/17/2013 1047   CL 104 10/17/2013 1047   CO2 26 11/21/2013 1323   CO2 23 10/17/2013 1047   BUN 17.5 11/21/2013 1323   BUN 15 10/17/2013 1047   CREATININE 1.2* 11/21/2013 1323   CREATININE 0.65 10/25/2013 1530   CREATININE 0.75 03/02/2013 1135      Component Value Date/Time   CALCIUM 9.8 11/21/2013 1323   CALCIUM 9.6 10/17/2013 1047   ALKPHOS 99 11/21/2013 1323   ALKPHOS 102 10/17/2013 1047   AST 20 11/21/2013 1323   AST 21 10/17/2013 1047   ALT 23 11/21/2013 1323   ALT 27 10/17/2013 1047   BILITOT 0.20 11/21/2013 1323   BILITOT 0.3 10/17/2013 1047       STUDIES: No results found.  ASSESSMENT: 63 y.o. [place] woman   (1)  status post right breast biopsy 03/04/2013 for a clinical T3 N1, stage IIIA invasive ductal carcinoma, grade 3, estrogen receptor 100% positive, progesterone receptor 100% positive, with an MIB-1 of 20% and no HER-2 amplification.  (2) right axillary lymph node biopsy 03/04/2013 was negative  (3) neoadjuvant chemotherapy with docetaxel doxorubicin and cyclophosphamide x6 cycles completed 08/23/2013  (4) status post right total mastectomy and right axillary sentinel node biopsy, ypT3 ypN0 7.2cm invasive lobular carcinoma, ER +, PR +, HER-2/neu negative, margins clear  (5) adjuvant radiation to follow surgery  (6) antiestrogen therapy to follow radiation  (7) large ventral hernia--surgical evaluation pending  PLAN: Aaryana's cellulitis continues to improve. She is on her feet at work for 7-8 hours per day, but she keeps them elevated when she is at home. The discoloration to this area is very concerning to her, though she admits  her legs are much improved. I explained to her that this may not resolve, and that clinically she is "out of the woods" as far as needing antibiotic treatment. Decreasing circulation to this area, and standing all day are certainly factors to this outcome. She states that she would like a second opinion and will be making an appointment with her dermatologist.   Aleece's first radiology treatment is tomorrow. She will return to this clinic in Oct to talk with Dr. Jana Hakim about starting antiestrogen therapy. Mar[place]ha understands and agrees with  this plan. She knows a goal of treatment in her case is cure. She has been encouraged to call with any issues that might arise before her next visit here.   Denise Duster, NP     12/06/2013 12:45 PM

## 2013-12-07 ENCOUNTER — Encounter (INDEPENDENT_AMBULATORY_CARE_PROVIDER_SITE_OTHER): Payer: 59 | Admitting: General Surgery

## 2013-12-07 ENCOUNTER — Ambulatory Visit
Admission: RE | Admit: 2013-12-07 | Discharge: 2013-12-07 | Disposition: A | Discharge status: Home / Self Care | Payer: 59 | Source: Ambulatory Visit | Attending: Radiation Oncology | Admitting: Radiation Oncology

## 2013-12-07 ENCOUNTER — Ambulatory Visit: Payer: 59 | Admitting: Physical Therapy

## 2013-12-07 DIAGNOSIS — Z510 Encounter for antineoplastic radiation therapy: Secondary | ICD-10-CM | POA: Diagnosis not present

## 2013-12-07 DIAGNOSIS — C50411 Malignant neoplasm of upper-outer quadrant of right female breast: Secondary | ICD-10-CM

## 2013-12-07 DIAGNOSIS — Z5189 Encounter for other specified aftercare: Secondary | ICD-10-CM | POA: Diagnosis not present

## 2013-12-07 NOTE — Progress Notes (Signed)
  Radiation Oncology         [phone] ________________________________  Name: Denise Becker MRN: [id number]  Date: 12/07/2013  DOB: Apr 24, 1950  Simulation Verification Note  Status: outpatient  NARRATIVE: The patient was brought to the treatment unit and placed in the planned treatment position. The clinical setup was verified. Then port films were obtained and uploaded to the radiation oncology medical record software.  The treatment beams were carefully compared against the planned radiation fields. The position location and shape of the radiation fields was reviewed. The targeted volume of tissue appears appropriately covered by the radiation beams. Organs at risk appear to be excluded as planned.  Based on my personal review, I approved the simulation verification. The patient's treatment will proceed as planned.  ------------------------------------------------  Thea Silversmith, MD

## 2013-12-08 ENCOUNTER — Ambulatory Visit
Admission: RE | Admit: 2013-12-08 | Discharge: 2013-12-08 | Disposition: A | Discharge status: Home / Self Care | Payer: 59 | Source: Ambulatory Visit | Attending: Radiation Oncology | Admitting: Radiation Oncology

## 2013-12-08 DIAGNOSIS — Z510 Encounter for antineoplastic radiation therapy: Secondary | ICD-10-CM | POA: Diagnosis not present

## 2013-12-08 DIAGNOSIS — C50111 Malignant neoplasm of central portion of right female breast: Secondary | ICD-10-CM

## 2013-12-08 DIAGNOSIS — C50411 Malignant neoplasm of upper-outer quadrant of right female breast: Secondary | ICD-10-CM

## 2013-12-08 MED ORDER — RADIAPLEXRX EX GEL
Freq: Once | CUTANEOUS | Status: AC
  Administered 2013-12-08: 11:00:00 via TOPICAL

## 2013-12-08 MED ORDER — ALRA NON-METALLIC DEODORANT (RAD-ONC)
1.0000 | Freq: Once | TOPICAL | Status: AC
Start: 2013-12-08 — End: 2013-12-08
  Administered 2013-12-08: 1 via TOPICAL

## 2013-12-08 MED ORDER — ALRA NON-METALLIC DEODORANT (RAD-ONC): 1.0000 "application " | Freq: Once | TOPICAL | Status: DC

## 2013-12-08 MED ORDER — RADIAPLEXRX EX GEL: Freq: Once | CUTANEOUS | Status: DC

## 2013-12-08 NOTE — Progress Notes (Signed)
Patient education completed with patient. Gave her "Radiation and You" booklet w/all pertinent information marked and discussed, re: fatigue, skin irritation/care, nutrition, pain. Gave pt Radiaplex, Alra w/instructions for proper use. Pt verbalized understanding.

## 2013-12-09 ENCOUNTER — Ambulatory Visit
Admission: RE | Admit: 2013-12-09 | Discharge: 2013-12-09 | Disposition: A | Discharge status: Home / Self Care | Payer: 59 | Source: Ambulatory Visit | Attending: Radiation Oncology | Admitting: Radiation Oncology

## 2013-12-09 DIAGNOSIS — Z510 Encounter for antineoplastic radiation therapy: Secondary | ICD-10-CM | POA: Diagnosis not present

## 2013-12-12 ENCOUNTER — Ambulatory Visit
Admission: RE | Admit: 2013-12-12 | Discharge: 2013-12-12 | Disposition: A | Discharge status: Home / Self Care | Payer: 59 | Source: Ambulatory Visit | Attending: Radiation Oncology | Admitting: Radiation Oncology

## 2013-12-12 ENCOUNTER — Ambulatory Visit: Payer: 59 | Admitting: Physical Therapy

## 2013-12-12 DIAGNOSIS — Z510 Encounter for antineoplastic radiation therapy: Secondary | ICD-10-CM | POA: Diagnosis not present

## 2013-12-12 DIAGNOSIS — Z5189 Encounter for other specified aftercare: Secondary | ICD-10-CM | POA: Diagnosis not present

## 2013-12-13 ENCOUNTER — Encounter: Payer: Self-pay | Admitting: Radiation Oncology

## 2013-12-13 ENCOUNTER — Ambulatory Visit
Admission: RE | Admit: 2013-12-13 | Discharge: 2013-12-13 | Disposition: A | Discharge status: Home / Self Care | Payer: 59 | Source: Ambulatory Visit | Attending: Radiation Oncology | Admitting: Radiation Oncology

## 2013-12-13 VITALS — BP 127/67 | HR 77 | Resp 16 | Wt 209.0 lb

## 2013-12-13 DIAGNOSIS — C50411 Malignant neoplasm of upper-outer quadrant of right female breast: Secondary | ICD-10-CM

## 2013-12-13 DIAGNOSIS — Z510 Encounter for antineoplastic radiation therapy: Secondary | ICD-10-CM | POA: Diagnosis not present

## 2013-12-13 NOTE — Progress Notes (Signed)
Weekly Management Note Current Dose: 3.6  Gy  Projected Dose:60.4  Gy   Narrative:  The patient presents for routine under treatment assessment.  CBCT/MVCT images/Port film x-rays were reviewed.  The chart was checked. Frustrated by time on the treatment machine. RN education performed.  Physical Findings: Weight: 209 lb (94.802 kg). Unchanged  Impression:  The patient is tolerating radiation.  Plan:  Continue treatment as planned. Reassured re: verification simulation and importance of films. Start radiaplex.

## 2013-12-13 NOTE — Progress Notes (Signed)
Weight and vitals stable. Reports using radiaplex and alra as directed without complication. Denies skin changes within treatment field. Denies pain or fatigue. Flat affect noted.

## 2013-12-14 ENCOUNTER — Ambulatory Visit
Admission: RE | Admit: 2013-12-14 | Discharge: 2013-12-14 | Disposition: A | Discharge status: Home / Self Care | Payer: 59 | Source: Ambulatory Visit | Attending: Radiation Oncology | Admitting: Radiation Oncology

## 2013-12-14 DIAGNOSIS — Z510 Encounter for antineoplastic radiation therapy: Secondary | ICD-10-CM | POA: Diagnosis not present

## 2013-12-15 ENCOUNTER — Ambulatory Visit: Payer: 59 | Admitting: Physical Therapy

## 2013-12-15 ENCOUNTER — Ambulatory Visit
Admission: RE | Admit: 2013-12-15 | Discharge: 2013-12-15 | Disposition: A | Discharge status: Home / Self Care | Payer: 59 | Source: Ambulatory Visit | Attending: Radiation Oncology | Admitting: Radiation Oncology

## 2013-12-15 DIAGNOSIS — Z5189 Encounter for other specified aftercare: Secondary | ICD-10-CM | POA: Diagnosis not present

## 2013-12-15 DIAGNOSIS — Z510 Encounter for antineoplastic radiation therapy: Secondary | ICD-10-CM | POA: Diagnosis not present

## 2013-12-16 ENCOUNTER — Ambulatory Visit
Admission: RE | Admit: 2013-12-16 | Discharge: 2013-12-16 | Disposition: A | Discharge status: Home / Self Care | Payer: 59 | Source: Ambulatory Visit | Attending: Radiation Oncology | Admitting: Radiation Oncology

## 2013-12-16 DIAGNOSIS — Z510 Encounter for antineoplastic radiation therapy: Secondary | ICD-10-CM | POA: Diagnosis not present

## 2013-12-19 ENCOUNTER — Ambulatory Visit
Admission: RE | Admit: 2013-12-19 | Discharge: 2013-12-19 | Disposition: A | Discharge status: Home / Self Care | Payer: 59 | Source: Ambulatory Visit | Attending: Radiation Oncology | Admitting: Radiation Oncology

## 2013-12-19 ENCOUNTER — Ambulatory Visit: Payer: 59 | Admitting: Physical Therapy

## 2013-12-19 DIAGNOSIS — Z5189 Encounter for other specified aftercare: Secondary | ICD-10-CM | POA: Diagnosis not present

## 2013-12-19 DIAGNOSIS — Z510 Encounter for antineoplastic radiation therapy: Secondary | ICD-10-CM | POA: Diagnosis not present

## 2013-12-20 ENCOUNTER — Ambulatory Visit
Admission: RE | Admit: 2013-12-20 | Discharge: 2013-12-20 | Disposition: A | Discharge status: Home / Self Care | Payer: 59 | Source: Ambulatory Visit | Attending: Radiation Oncology | Admitting: Radiation Oncology

## 2013-12-20 ENCOUNTER — Ambulatory Visit (HOSPITAL_BASED_OUTPATIENT_CLINIC_OR_DEPARTMENT_OTHER): Payer: 59 | Admitting: Nurse Practitioner

## 2013-12-20 VITALS — BP 121/78 | HR 81 | Temp 98.2°F | Resp 18 | Ht 65.0 in | Wt 212.0 lb

## 2013-12-20 VITALS — BP 146/80 | HR 69 | Temp 98.5°F | Wt 210.7 lb

## 2013-12-20 DIAGNOSIS — C50411 Malignant neoplasm of upper-outer quadrant of right female breast: Secondary | ICD-10-CM

## 2013-12-20 DIAGNOSIS — C50419 Malignant neoplasm of upper-outer quadrant of unspecified female breast: Secondary | ICD-10-CM

## 2013-12-20 DIAGNOSIS — I872 Venous insufficiency (chronic) (peripheral): Secondary | ICD-10-CM

## 2013-12-20 DIAGNOSIS — I878 Other specified disorders of veins: Secondary | ICD-10-CM

## 2013-12-20 DIAGNOSIS — Z510 Encounter for antineoplastic radiation therapy: Secondary | ICD-10-CM | POA: Diagnosis not present

## 2013-12-20 DIAGNOSIS — R609 Edema, unspecified: Secondary | ICD-10-CM

## 2013-12-20 NOTE — Progress Notes (Signed)
Weekly assessment of radiation to right chest wall.Completed 9 treatments.Skin with mild tan.Feeling a little light headed.No breakfast this morning.Gave ginger ale and told to at least eat light breakfast, fruit, toast or yogurt.continue radiaplex twice daily.

## 2013-12-20 NOTE — Progress Notes (Signed)
Weekly Management Note Current Dose: 16.2  Gy  Projected Dose: 60.4 Gy   Narrative:  The patient presents for routine under treatment assessment.  CBCT/MVCT images/Port film x-rays were reviewed.  The chart was checked. Doing well from a radiation standpoint. Lightheaded this AM (took meds and didn't eat). C/o hard skin over lower extremities. Would like to be evaluated.  Physical Findings: Weight: 210 lb 11.2 oz (95.573 kg). Woody venous stasis changes in bilateral lower extremities. Chest wall is slightly dark.   Impression:  The patient is tolerating radiation.  Plan:  Continue treatment as planned. Continue radiaplex. Refer to med onc for discussion of tx of legs.

## 2013-12-21 ENCOUNTER — Ambulatory Visit
Admission: RE | Admit: 2013-12-21 | Discharge: 2013-12-21 | Disposition: A | Discharge status: Home / Self Care | Payer: 59 | Source: Ambulatory Visit | Attending: Radiation Oncology | Admitting: Radiation Oncology

## 2013-12-21 ENCOUNTER — Ambulatory Visit: Payer: 59 | Admitting: Physical Therapy

## 2013-12-21 DIAGNOSIS — Z5189 Encounter for other specified aftercare: Secondary | ICD-10-CM | POA: Diagnosis not present

## 2013-12-21 DIAGNOSIS — Z510 Encounter for antineoplastic radiation therapy: Secondary | ICD-10-CM | POA: Diagnosis not present

## 2013-12-22 ENCOUNTER — Ambulatory Visit
Admission: RE | Admit: 2013-12-22 | Discharge: 2013-12-22 | Disposition: A | Discharge status: Home / Self Care | Payer: 59 | Source: Ambulatory Visit | Attending: Radiation Oncology | Admitting: Radiation Oncology

## 2013-12-22 ENCOUNTER — Encounter: Payer: Self-pay | Admitting: Nurse Practitioner

## 2013-12-22 DIAGNOSIS — I878 Other specified disorders of veins: Secondary | ICD-10-CM | POA: Insufficient documentation

## 2013-12-22 DIAGNOSIS — Z510 Encounter for antineoplastic radiation therapy: Secondary | ICD-10-CM | POA: Diagnosis not present

## 2013-12-22 NOTE — Assessment & Plan Note (Signed)
Patient completed neoadjuvant Adriamycin/Cytoxan/Taxotere chemotherapy in late May 2015.  She underwent a right mastectomy on 10/25/2013.  She initiated radiation treatments on 12/08/2013.  Patient states that she will receive a total of 28 total radiation treatments.  She has plans for her next lab draw on 01/09/2014.  She will followup with Dr. Jana Hakim on 01/16/2014.

## 2013-12-22 NOTE — Progress Notes (Signed)
[facility]   Chief Complaint  Patient presents with  . Follow-up    HPI: Denise Becker 63 y.o. female diagnosed with breast cancer.  Patient has completed neoadjuvant chemotherapy; and underwent a right mastectomy.  Patient initiated radiation treatments on September 10th 2015.  Patient called the cancer Center requesting urgent care visit.  She is complaining of bilateral lower extremity darkening/discoloration for the past few months.  She was diagnosed with mild, questionable cellulitis 3-4 weeks ago; and completed a round of Keflex antibiotics.  She states this did nothing to help with the darkening of her lower legs.  She denies any specific pain or achiness to her lower legs.  Patient states that she has been informed that this is most likely venous stasis; but does not want to accept this is a permanent issue.  The patient mentioned that she has considered going to a dermatologist to help resolve this issue.   HPI   ROS  Past Medical History  Diagnosis Date  . Hypertension   . Anxiety     anxiousabout impending surgery- loss of breast  . H/O hiatal hernia   . Spontaneous dislocation of shoulder [phone]    "right; a few times over the years"  . GERD (gastroesophageal reflux disease)     "just chemo related"  . Arthritis     "joints hurt q now and then" (10/25/2013)  . Depression     history of depression after son died in the 2000's  . Breast cancer of upper-outer quadrant of right female breast 03/10/2013    Past Surgical History  Procedure Laterality Date  . Multiple tooth extractions Bilateral 2011-2014 X 6  . Tonsillectomy    . Cesarean section  1980  . Portacath placement Right 04/15/2013    Procedure: INSERTION PORT-A-CATH;  Surgeon: Adin Hector, MD;  Location: [facility];  Service: General;  Laterality: Right;  . Port a cath revision Right 04/29/2013    Procedure: PORT A CATH REVISION;  Surgeon: Adin Hector, MD;   Location: [facility];  Service: General;  Laterality: Right;  . Mastectomy complete / simple w/ sentinel node biopsy Right 10/25/2013  . Port-a-cath removal  10/25/2013  . Wisdom tooth extraction    . Abdominal hysterectomy  1990's  . Breast biopsy Right 02/2013  . Mastectomy w/ sentinel node biopsy Right 10/25/2013    Procedure: RIGHT TOTAL MASTECTOMY WITH SENTINEL LYMPH NODE BIOPSY;  Surgeon: Adin Hector, MD;  Location: [facility];  Service: General;  Laterality: Right;  . Port-a-cath removal N/A 10/25/2013    Procedure: REMOVAL PORT-A-CATH;  Surgeon: Adin Hector, MD;  Location: [facility];  Service: General;  Laterality: N/A;    has Breast cancer of upper-outer quadrant of right female breast; Essential hypertension, benign; Adjustment disorder with mixed anxiety and depressed mood; Edema of both legs; Abdominal wall hernia; Cancer of central portion of right female breast; Bilateral lower leg cellulitis; and Venous stasis on her problem list.     is allergic to ciprofloxacin.    Medication List       This list is accurate as of: 12/20/13 11:59 PM.  Always use your most recent med list.               AZOR 10-40 MG per tablet  Generic drug:  amLODipine-olmesartan  Take 1 tablet by mouth every morning.     multivitamin capsule  Take 1 capsule by mouth daily.  naproxen sodium 220 MG tablet  Commonly known as:  ANAPROX  Take 220 mg by mouth 2 (two) times daily as needed.     RADIAGEL Gel  Apply topically 2 (two) times daily.     tobramycin-dexamethasone ophthalmic solution  Commonly known as:  TOBRADEX  Place 1 drop into both eyes 2 (two) times daily.         PHYSICAL EXAMINATION  Blood pressure 121/78, pulse 81, temperature 98.2 F (36.8 C), temperature source Oral, resp. rate 18, height 5\' 5"  (1.651 m), weight 212 lb (96.163 kg), SpO2 99.00%.  Physical Exam  Nursing note and vitals reviewed. Constitutional: She is oriented to person, place, and  time and well-developed, well-nourished, and in no distress. No distress.  HENT:  Head: Normocephalic.  Eyes: Pupils are equal, round, and reactive to light.  Neck: Normal range of motion.  Musculoskeletal: Normal range of motion. She exhibits edema. She exhibits no tenderness.  Trace bilateral lower extremity edema noted.  All pulses are palpable in all extremities are warm.  Patient does have what appears to be a venous stasis to bilateral lower extremities; with moderately darkened skin to bilateral lower extremities.  No evidence of infection noted.  Neurological: She is alert and oriented to person, place, and time. Gait normal.  Skin: Skin is warm and dry. No rash noted. No erythema.  Please see above note regarding discoloration of lower extremities and probable venous stasis diagnosis.  Psychiatric: Affect normal.   ASSESSMENT/PLAN:    Breast cancer of upper-outer quadrant of right female breast  Assessment & Plan Patient completed neoadjuvant Adriamycin/Cytoxan/Taxotere chemotherapy in late May 2015.  She underwent a right mastectomy on 10/25/2013.  She initiated radiation treatments on 12/08/2013.  Patient states that she will receive a total of 28 total radiation treatments.  She has plans for her next lab draw on 01/09/2014.  She will followup with Dr. Jana Hakim on 01/16/2014.   Venous stasis  Assessment & Plan Patient was treated several weeks ago for some mild bilateral lower leg cellulitis; has completed all of the Keflex as prescribed.  It does not appear that she has any infection whatsoever to her bilateral legs at this time.  She does have some very mild bilateral lower extremity edema.  No calf tenderness on exam.  All pulses are palpable extremities are warm.  It does appear that her lower legs are darkened/discolored secondary to venous stasis.  Long discussion with patient reviewing the possibility that this discoloration may very well be permanent; but patient states  that she will not accept this as a possibility.  Patient states that she has consider going to a dermatologist for treatment of her discolored lower legs.  Advised patient that her dermatologist would most likely be unable to correct this.  Advised patient that she should continue to elevate her legs above the level of for heart when she is at rest.  Also,  patient can try some over-the-counter compression socks/stockings to see if this will help.   Patient stated understanding of all instructions; and was in agreement with this plan of care. The patient knows to call the clinic with any problems, questions or concerns.   Review/collaboration with Dr. Jana Hakim regarding all aspects of patient's visit today.   Total time spent with patient was 40 minutes;  with greater than 75 percent of that time spent in face to face counseling regarding her symptoms, and coordination of care and follow up.  Disclaimer: This note was dictated with voice  recognition software. Similar sounding words can inadvertently be transcribed and may not be corrected upon review.   Drue Second, NP 12/22/2013

## 2013-12-22 NOTE — Assessment & Plan Note (Signed)
Patient was treated several weeks ago for some mild bilateral lower leg cellulitis; has completed all of the Keflex as prescribed.  It does not appear that she has any infection whatsoever to her bilateral legs at this time.  She does have some very mild bilateral lower extremity edema.  No calf tenderness on exam.  All pulses are palpable extremities are warm.  It does appear that her lower legs are darkened/discolored secondary to venous stasis.  Long discussion with patient reviewing the possibility that this discoloration may very well be permanent; but patient states that she will not accept this as a possibility.  Patient states that she has consider going to a dermatologist for treatment of her discolored lower legs.  Advised patient that her dermatologist would most likely be unable to correct this.  Advised patient that she should continue to elevate her legs above the level of for heart when she is at rest.  Also,  patient can try some over-the-counter compression socks/stockings to see if this will help.

## 2013-12-23 ENCOUNTER — Ambulatory Visit
Admission: RE | Admit: 2013-12-23 | Discharge: 2013-12-23 | Disposition: A | Discharge status: Home / Self Care | Payer: 59 | Source: Ambulatory Visit | Attending: Radiation Oncology | Admitting: Radiation Oncology

## 2013-12-23 DIAGNOSIS — Z510 Encounter for antineoplastic radiation therapy: Secondary | ICD-10-CM | POA: Diagnosis not present

## 2013-12-26 ENCOUNTER — Ambulatory Visit: Payer: 59

## 2013-12-27 ENCOUNTER — Ambulatory Visit
Admission: RE | Admit: 2013-12-27 | Discharge: 2013-12-27 | Disposition: A | Discharge status: Home / Self Care | Payer: 59 | Source: Ambulatory Visit | Attending: Radiation Oncology | Admitting: Radiation Oncology

## 2013-12-27 ENCOUNTER — Ambulatory Visit: Payer: 59 | Admitting: Physical Therapy

## 2013-12-27 VITALS — BP 140/82 | HR 82 | Temp 98.0°F | Wt 212.7 lb

## 2013-12-27 DIAGNOSIS — Z510 Encounter for antineoplastic radiation therapy: Secondary | ICD-10-CM | POA: Diagnosis not present

## 2013-12-27 DIAGNOSIS — Z5189 Encounter for other specified aftercare: Secondary | ICD-10-CM | POA: Diagnosis not present

## 2013-12-27 DIAGNOSIS — C50411 Malignant neoplasm of upper-outer quadrant of right female breast: Secondary | ICD-10-CM

## 2013-12-27 NOTE — Progress Notes (Signed)
Weekly assessment of radiation to right chest wall.Moderate darkening of skin with out peeling.Improvement of leg swelling and discomfort.Wearing compression stockings has helped.

## 2013-12-27 NOTE — Progress Notes (Signed)
Weekly Management Note Current Dose:  23.4 Gy  Projected Dose: 60.4 Gy   Narrative:  The patient presents for routine under treatment assessment.  CBCT/MVCT images/Port film x-rays were reviewed.  The chart was checked. Legs better with compression stockings. Skin feels well. Doing PT.   Physical Findings: Weight: 212 lb 11.2 oz (96.48 kg). Unchanged. Slightly dark right chest wall. Feet less shiny and less swollen.   Impression:  The patient is tolerating radiation.  Plan:  Continue treatment as planned. Continue radiaplex. We discussed that as skin becomes more sore she may need to tak a break from PT>

## 2013-12-28 ENCOUNTER — Ambulatory Visit
Admission: RE | Admit: 2013-12-28 | Discharge: 2013-12-28 | Disposition: A | Discharge status: Home / Self Care | Payer: 59 | Source: Ambulatory Visit | Attending: Radiation Oncology | Admitting: Radiation Oncology

## 2013-12-28 DIAGNOSIS — Z510 Encounter for antineoplastic radiation therapy: Secondary | ICD-10-CM | POA: Diagnosis not present

## 2013-12-29 ENCOUNTER — Ambulatory Visit: Payer: 59 | Attending: General Surgery | Admitting: Physical Therapy

## 2013-12-29 ENCOUNTER — Ambulatory Visit
Admission: RE | Admit: 2013-12-29 | Discharge: 2013-12-29 | Disposition: A | Discharge status: Home / Self Care | Payer: 59 | Source: Ambulatory Visit | Attending: Radiation Oncology | Admitting: Radiation Oncology

## 2013-12-29 DIAGNOSIS — Z170 Estrogen receptor positive status [ER+]: Secondary | ICD-10-CM | POA: Insufficient documentation

## 2013-12-29 DIAGNOSIS — Z510 Encounter for antineoplastic radiation therapy: Secondary | ICD-10-CM | POA: Insufficient documentation

## 2013-12-29 DIAGNOSIS — C50411 Malignant neoplasm of upper-outer quadrant of right female breast: Secondary | ICD-10-CM | POA: Insufficient documentation

## 2013-12-29 DIAGNOSIS — C50211 Malignant neoplasm of upper-inner quadrant of right female breast: Secondary | ICD-10-CM | POA: Diagnosis not present

## 2013-12-30 ENCOUNTER — Ambulatory Visit
Admission: RE | Admit: 2013-12-30 | Discharge: 2013-12-30 | Disposition: A | Discharge status: Home / Self Care | Payer: 59 | Source: Ambulatory Visit | Attending: Radiation Oncology | Admitting: Radiation Oncology

## 2013-12-30 DIAGNOSIS — Z510 Encounter for antineoplastic radiation therapy: Secondary | ICD-10-CM | POA: Diagnosis not present

## 2014-01-02 ENCOUNTER — Ambulatory Visit: Payer: 59 | Admitting: Physical Therapy

## 2014-01-02 ENCOUNTER — Ambulatory Visit
Admission: RE | Admit: 2014-01-02 | Discharge: 2014-01-02 | Disposition: A | Discharge status: Home / Self Care | Payer: 59 | Source: Ambulatory Visit | Attending: Radiation Oncology | Admitting: Radiation Oncology

## 2014-01-02 DIAGNOSIS — Z510 Encounter for antineoplastic radiation therapy: Secondary | ICD-10-CM | POA: Diagnosis not present

## 2014-01-03 ENCOUNTER — Ambulatory Visit
Admission: RE | Admit: 2014-01-03 | Discharge: 2014-01-03 | Disposition: A | Discharge status: Home / Self Care | Payer: 59 | Source: Ambulatory Visit | Attending: Radiation Oncology | Admitting: Radiation Oncology

## 2014-01-03 ENCOUNTER — Encounter: Payer: Self-pay | Admitting: Radiation Oncology

## 2014-01-03 VITALS — BP 141/89 | HR 83 | Temp 98.1°F | Resp 20 | Wt 211.6 lb

## 2014-01-03 DIAGNOSIS — Z510 Encounter for antineoplastic radiation therapy: Secondary | ICD-10-CM | POA: Diagnosis not present

## 2014-01-03 DIAGNOSIS — C50411 Malignant neoplasm of upper-outer quadrant of right female breast: Secondary | ICD-10-CM

## 2014-01-03 NOTE — Progress Notes (Signed)
Weekly rad txs 18 completed Rt CW, dark , tanning on right chest wall,axilla area, 2 small areas that skin has broken, using radiaplex bid, occasional pain in area but resolves quickly stated, appetite good, fatigued at times, 10:36 AM

## 2014-01-03 NOTE — Progress Notes (Signed)
Weekly Management Note Current Dose: 32.4  Gy  Projected Dose: 60.4 Gy   Narrative:  The patient presents for routine under treatment assessment.  CBCT/MVCT images/Port film x-rays were reviewed.  The chart was checked. Occasional pain over right chest wall. Feet less swollen with compression stockings.   Physical Findings: Weight: 211 lb 9.6 oz (95.981 kg). Unchanged. Dark right chest wall. Alert and oriented.   Impression:  The patient is tolerating radiation.  Plan:  Continue treatment as planned. Continue radiaplex. ? Need for boost. Will monitor skin. Continue compression stockings.

## 2014-01-04 ENCOUNTER — Ambulatory Visit
Admission: RE | Admit: 2014-01-04 | Discharge: 2014-01-04 | Disposition: A | Discharge status: Home / Self Care | Payer: 59 | Source: Ambulatory Visit | Attending: Radiation Oncology | Admitting: Radiation Oncology

## 2014-01-04 ENCOUNTER — Ambulatory Visit: Payer: 59 | Admitting: Physical Therapy

## 2014-01-04 DIAGNOSIS — C50211 Malignant neoplasm of upper-inner quadrant of right female breast: Secondary | ICD-10-CM | POA: Diagnosis not present

## 2014-01-04 DIAGNOSIS — Z510 Encounter for antineoplastic radiation therapy: Secondary | ICD-10-CM | POA: Diagnosis not present

## 2014-01-05 ENCOUNTER — Ambulatory Visit
Admission: RE | Admit: 2014-01-05 | Discharge: 2014-01-05 | Disposition: A | Discharge status: Home / Self Care | Payer: 59 | Source: Ambulatory Visit | Attending: Radiation Oncology | Admitting: Radiation Oncology

## 2014-01-05 DIAGNOSIS — Z510 Encounter for antineoplastic radiation therapy: Secondary | ICD-10-CM | POA: Diagnosis not present

## 2014-01-06 ENCOUNTER — Ambulatory Visit: Payer: 59 | Admitting: Physical Therapy

## 2014-01-06 ENCOUNTER — Ambulatory Visit
Admission: RE | Admit: 2014-01-06 | Discharge: 2014-01-06 | Disposition: A | Discharge status: Home / Self Care | Payer: 59 | Source: Ambulatory Visit | Attending: Radiation Oncology | Admitting: Radiation Oncology

## 2014-01-06 DIAGNOSIS — C50211 Malignant neoplasm of upper-inner quadrant of right female breast: Secondary | ICD-10-CM | POA: Diagnosis not present

## 2014-01-06 DIAGNOSIS — Z510 Encounter for antineoplastic radiation therapy: Secondary | ICD-10-CM | POA: Diagnosis not present

## 2014-01-09 ENCOUNTER — Other Ambulatory Visit (HOSPITAL_BASED_OUTPATIENT_CLINIC_OR_DEPARTMENT_OTHER): Payer: 59

## 2014-01-09 ENCOUNTER — Ambulatory Visit
Admission: RE | Admit: 2014-01-09 | Discharge: 2014-01-09 | Disposition: A | Discharge status: Home / Self Care | Payer: 59 | Source: Ambulatory Visit | Attending: Radiation Oncology | Admitting: Radiation Oncology

## 2014-01-09 DIAGNOSIS — C50419 Malignant neoplasm of upper-outer quadrant of unspecified female breast: Secondary | ICD-10-CM

## 2014-01-09 DIAGNOSIS — Z510 Encounter for antineoplastic radiation therapy: Secondary | ICD-10-CM | POA: Diagnosis not present

## 2014-01-09 DIAGNOSIS — C50411 Malignant neoplasm of upper-outer quadrant of right female breast: Secondary | ICD-10-CM

## 2014-01-09 LAB — COMPREHENSIVE METABOLIC PANEL (CC13)
ALK PHOS: 94 U/L (ref 40–150)
ALT: 20 U/L (ref 0–55)
AST: 13 U/L (ref 5–34)
Albumin: 3.5 g/dL (ref 3.5–5.0)
Anion Gap: 9 mEq/L (ref 3–11)
BILIRUBIN TOTAL: 0.23 mg/dL (ref 0.20–1.20)
BUN: 15.7 mg/dL (ref 7.0–26.0)
CO2: 24 mEq/L (ref 22–29)
CREATININE: 0.8 mg/dL (ref 0.6–1.1)
Calcium: 9.9 mg/dL (ref 8.4–10.4)
Chloride: 108 mEq/L (ref 98–109)
Glucose: 95 mg/dl (ref 70–140)
Potassium: 4 mEq/L (ref 3.5–5.1)
SODIUM: 141 meq/L (ref 136–145)
TOTAL PROTEIN: 7.8 g/dL (ref 6.4–8.3)

## 2014-01-09 LAB — CBC & DIFF AND RETIC
BASO%: 0.6 % (ref 0.0–2.0)
BASOS ABS: 0 10*3/uL (ref 0.0–0.1)
EOS%: 3.7 % (ref 0.0–7.0)
Eosinophils Absolute: 0.2 10*3/uL (ref 0.0–0.5)
HEMATOCRIT: 42.2 % (ref 34.8–46.6)
HEMOGLOBIN: 14.1 g/dL (ref 11.6–15.9)
Immature Retic Fract: 4.6 % (ref 1.60–10.00)
LYMPH%: 24.9 % (ref 14.0–49.7)
MCH: 26.8 pg (ref 25.1–34.0)
MCHC: 33.4 g/dL (ref 31.5–36.0)
MCV: 80.1 fL (ref 79.5–101.0)
MONO#: 0.7 10*3/uL (ref 0.1–0.9)
MONO%: 13.3 % (ref 0.0–14.0)
NEUT#: 2.8 10*3/uL (ref 1.5–6.5)
NEUT%: 57.5 % (ref 38.4–76.8)
PLATELETS: 247 10*3/uL (ref 145–400)
RBC: 5.27 10*6/uL (ref 3.70–5.45)
RDW: 16.7 % — ABNORMAL HIGH (ref 11.2–14.5)
RETIC %: 1.5 % (ref 0.70–2.10)
RETIC CT ABS: 79.05 10*3/uL (ref 33.70–90.70)
WBC: 4.9 10*3/uL (ref 3.9–10.3)
lymph#: 1.2 10*3/uL (ref 0.9–3.3)
nRBC: 0 % (ref 0–0)

## 2014-01-09 NOTE — Telephone Encounter (Signed)
NO ENTRY 

## 2014-01-10 ENCOUNTER — Ambulatory Visit
Admission: RE | Admit: 2014-01-10 | Discharge: 2014-01-10 | Disposition: A | Discharge status: Home / Self Care | Payer: 59 | Source: Ambulatory Visit | Attending: Radiation Oncology | Admitting: Radiation Oncology

## 2014-01-10 ENCOUNTER — Telehealth: Payer: Self-pay | Admitting: *Deleted

## 2014-01-10 ENCOUNTER — Encounter: Payer: Self-pay | Admitting: Radiation Oncology

## 2014-01-10 VITALS — BP 124/71 | HR 79 | Temp 98.1°F | Resp 20 | Wt 212.6 lb

## 2014-01-10 DIAGNOSIS — Z510 Encounter for antineoplastic radiation therapy: Secondary | ICD-10-CM | POA: Diagnosis present

## 2014-01-10 DIAGNOSIS — C50411 Malignant neoplasm of upper-outer quadrant of right female breast: Secondary | ICD-10-CM | POA: Diagnosis not present

## 2014-01-10 LAB — FOLLICLE STIMULATING HORMONE: FSH: 71.3 m[IU]/mL

## 2014-01-10 MED ORDER — BIAFINE EX EMUL
CUTANEOUS | Status: DC | PRN
  Administered 2014-01-10: 11:00:00 via TOPICAL

## 2014-01-10 NOTE — Progress Notes (Signed)
Weekly Management Note Current Dose:  41 Gy  Projected Dose: 50.4 Gy   Narrative:  The patient presents for routine under treatment assessment.  CBCT/MVCT images/Port film x-rays were reviewed.  The chart was checked. Irritation under arm. Sore arm as well. Seeing PT>. More itching not relieved with radiaplex.   Physical Findings: Weight: 212 lb 9.6 oz (96.435 kg). Dark dry desquamation over chest wall.   Impression:  The patient is tolerating radiation.  Plan:  Continue treatment as planned. No boost. Switch to biafene,.

## 2014-01-10 NOTE — Progress Notes (Signed)
Weekly rad txs rt chest wall,  Hyperpigmentation,tnning,skin intact, using radiaplex bid, does state itching starting, has improving sore throat sinus congestion stated, energy level getting better,appetite good, labs today will see Dr.[name]  01/16/14 at 5pm, has physical therapy tomorrow, for her right shoulder, tightness , wears compression stocking,   10:28 AM

## 2014-01-10 NOTE — Telephone Encounter (Signed)
CALLED PATIENT TO INFORM OF NUTRITION APPT. FOR 01-11-14 @10 :30 AM, SPOKE WITH PATIENT AND SHE IS AWARE OF THIS APPT.

## 2014-01-11 ENCOUNTER — Ambulatory Visit
Admission: RE | Admit: 2014-01-11 | Discharge: 2014-01-11 | Disposition: A | Discharge status: Home / Self Care | Payer: 59 | Source: Ambulatory Visit | Attending: Radiation Oncology | Admitting: Radiation Oncology

## 2014-01-11 ENCOUNTER — Ambulatory Visit: Payer: 59 | Admitting: Physical Therapy

## 2014-01-11 ENCOUNTER — Ambulatory Visit: Payer: 59 | Admitting: Nutrition

## 2014-01-11 DIAGNOSIS — Z510 Encounter for antineoplastic radiation therapy: Secondary | ICD-10-CM | POA: Diagnosis not present

## 2014-01-11 NOTE — Progress Notes (Signed)
63 year old female diagnosed with breast cancer, who has completed chemotherapy and has had surgery.  She is days away from completing radiation therapy.  She is a patient of Dr. Pablo Ledger.  Past medical history includes hypertension, anxiety, hiatal hernia, GERD, arthritis, and depression.  Medications include multivitamin.  Labs include creatinine of 0.8 on October 12.  Height: 65 inches. Weight: 212.6 pounds. Usual body weight 220 pounds. BMI: 35.38.  Patient is seeking healthy diet information status post completion of breast cancer treatment.  Patient would like to lose weight.  She has kept a food journal which has helped her in the past.  She is trying to walk for exercise and sees physical therapy for lymphedema.  Nutrition diagnosis:  Food and nutrition related knowledge deficit related to completion of breast cancer treatment as evidenced by no prior exposure to healthy diet information to reduce breast cancer recurrence.  Intervention:  Educated patient to consume healthy, plant-based diet monitoring total calories and fat intake.  Provided fact sheet on plant-based diet. Educated patient on portion control. Recommended patient work up to 150 minutes exercise weekly. Provided resources for patient. Provided a sample menu for patient. Questions were answered.  Teach back method used.  Contact information given.  Monitoring, evaluation, goals: Patient will make dietary changes to promote healthy, safe weight loss.  Next visit: Patient will call with questions.  She was referred to outpatient nutrition center for ongoing diet counseling as needed.  **Disclaimer: This note was dictated with voice recognition software. Similar sounding words can inadvertently be transcribed and this note may contain transcription errors which may not have been corrected upon publication of note.**

## 2014-01-12 ENCOUNTER — Ambulatory Visit
Admission: RE | Admit: 2014-01-12 | Discharge: 2014-01-12 | Disposition: A | Discharge status: Home / Self Care | Payer: 59 | Source: Ambulatory Visit | Attending: Radiation Oncology | Admitting: Radiation Oncology

## 2014-01-12 DIAGNOSIS — Z510 Encounter for antineoplastic radiation therapy: Secondary | ICD-10-CM | POA: Diagnosis not present

## 2014-01-13 ENCOUNTER — Ambulatory Visit
Admission: RE | Admit: 2014-01-13 | Discharge: 2014-01-13 | Disposition: A | Discharge status: Home / Self Care | Payer: 59 | Source: Ambulatory Visit | Attending: Radiation Oncology | Admitting: Radiation Oncology

## 2014-01-13 DIAGNOSIS — Z510 Encounter for antineoplastic radiation therapy: Secondary | ICD-10-CM | POA: Diagnosis not present

## 2014-01-13 LAB — ESTRADIOL, ULTRA SENS: Estradiol, Ultra Sensitive: 19 pg/mL

## 2014-01-16 ENCOUNTER — Ambulatory Visit
Admission: RE | Admit: 2014-01-16 | Discharge: 2014-01-16 | Disposition: A | Discharge status: Home / Self Care | Payer: 59 | Source: Ambulatory Visit | Attending: Radiation Oncology | Admitting: Radiation Oncology

## 2014-01-16 ENCOUNTER — Ambulatory Visit (HOSPITAL_BASED_OUTPATIENT_CLINIC_OR_DEPARTMENT_OTHER): Payer: 59 | Admitting: Oncology

## 2014-01-16 ENCOUNTER — Telehealth: Payer: Self-pay | Admitting: Oncology

## 2014-01-16 VITALS — BP 127/66 | HR 88 | Temp 98.4°F | Resp 20 | Ht 65.0 in | Wt 214.4 lb

## 2014-01-16 DIAGNOSIS — Z170 Estrogen receptor positive status [ER+]: Secondary | ICD-10-CM

## 2014-01-16 DIAGNOSIS — Z510 Encounter for antineoplastic radiation therapy: Secondary | ICD-10-CM | POA: Diagnosis not present

## 2014-01-16 DIAGNOSIS — C50411 Malignant neoplasm of upper-outer quadrant of right female breast: Secondary | ICD-10-CM

## 2014-01-16 DIAGNOSIS — C50811 Malignant neoplasm of overlapping sites of right female breast: Secondary | ICD-10-CM

## 2014-01-16 NOTE — Telephone Encounter (Signed)
per pof to sch pt appt-sch & mailed pt copy per pt req

## 2014-01-16 NOTE — Progress Notes (Signed)
ID: [name] OB: 11-Aug-1950  MR#: [id number]  BJS#:[id number]  PCP: Joya Gaskins GYN:  Lahoma Crocker, MD SU: Fanny Skates, MD OTHER MD: Thea Silversmith, MD  CHIEF COMPLAINT: Right breast cancer THERAPY: To start antiestrogen therapy  HISTORY OF PRESENT ILLNESS: From the initial intake note 03/16/2013:  Denise Becker palpated a change in her right breast sometime in August or September 2014. She was aware that this might be significant, but waited on meds until she saw her gynecologist for a routine visit. Dr. Delsa Sale immediately he set her up for right mammography and ultrasonography performed 03/03/2013. Mammography found an area of density in the upper outer quadrant of the right breast measuring 5 cm in the with right nipple retraction. There was no evidence of skin thickening. On exam there was a firm palpable mass in the superior right breast extending from 1:00 to 9:00. The right nipple was completely retracted. There was no skin thickening. The right axilla was benign by palpation. Ultrasound showed a prominent irregular hypoechoic mass larger than the ultrasound screen. Ultrasound of the right axilla showed 2 adjacent small lymph nodes with diffusely thickened cortices, the largest measuring 1.1 cm.  On 03/04/2013 the patient underwent biopsy of the right breast mass and low right axillary lymph node, with the pathology ([id number]) showing, in the breast, and invasive ductal carcinoma, grade 3, 100% estrogen receptor positive, and her percent progesterone receptor positive, both with strong staining intensity, and a proliferation marker of 20%. There was no HER-2 amplification with a ratio of 1.46 by CISH and an average copy number of 2.85.  On 03/11/2013 the patient underwent bilateral breast MRI. This showed an irregular enhancing mass in the central to upper right breast with nipple retraction measuring in total 6.8 cm. There were several adjacent enhancing  nodules as well. There was no involvement of the pectoralis muscle. There were no definite morphologically abnormal right axillary lymph nodes there was no definite internal mammary or lymphadenopathy. Left breast was unremarkable.  The patient's subsequent history is as detailed below   INTERVAL HISTORY: Denise Becker returns for follow up of her breast cancer. She will complete her radiation treatments tomorrow. In general she has done well with the radiation, with some fatigue (she is not yet back to work) and hyperpigmentation. As bit mild desquamation.  REVIEW OF SYSTEMS: She reports no unusual headaches, visual changes, nausea, vomiting, cough, phlegm production, pleurisy, shortness of breath, palpitations, chest pain or pressure, or change in bowel or bladder habits. There has been no fever, rash, bleeding, unexplained fatigue, or unexplained weight loss. A detailed review of systems today was otherwise stable.  PAST MEDICAL HISTORY: Past Medical History  Diagnosis Date  . Hypertension   . Anxiety     anxiousabout impending surgery- loss of breast  . H/O hiatal hernia   . Spontaneous dislocation of shoulder [phone]    "right; a few times over the years"  . GERD (gastroesophageal reflux disease)     "just chemo related"  . Arthritis     "joints hurt q now and then" (10/25/2013)  . Depression     history of depression after son died in the 2000's  . Breast cancer of upper-outer quadrant of right female breast 03/10/2013    PAST SURGICAL HISTORY: Past Surgical History  Procedure Laterality Date  . Multiple tooth extractions Bilateral 2011-2014 X 6  . Tonsillectomy    . Cesarean section  1980  . Portacath placement Right 04/15/2013    Procedure: INSERTION  PORT-A-CATH;  Surgeon: Adin Hector, MD;  Location: [place] [facility];  Service: General;  Laterality: Right;  . Port a cath revision Right 04/29/2013    Procedure: PORT A CATH REVISION;  Surgeon: Adin Hector, MD;   Location: [place];  Service: General;  Laterality: Right;  . Mastectomy complete / simple w/ sentinel node biopsy Right 10/25/2013  . Port-a-cath removal  10/25/2013  . Wisdom tooth extraction    . Abdominal hysterectomy  1990's  . Breast biopsy Right 02/2013  . Mastectomy w/ sentinel node biopsy Right 10/25/2013    Procedure: RIGHT TOTAL MASTECTOMY WITH SENTINEL LYMPH NODE BIOPSY;  Surgeon: Adin Hector, MD;  Location: [facility];  Service: General;  Laterality: Right;  . Port-a-cath removal N/A 10/25/2013    Procedure: REMOVAL PORT-A-CATH;  Surgeon: Adin Hector, MD;  Location: [facility];  Service: General;  Laterality: N/A;    FAMILY HISTORY Family History  Problem Relation Age of Onset  . Cancer Mother   . Hypertension Mother   . Bowel Disease Mother   . Lung disease Father   . Heart disease Maternal Grandmother   . Heart disease Maternal Grandfather   the patient's mother,Denise Becker, had a lung mass but apparently died from unrelated causes (she was followed by Dr. Earlie Server here). She was 64 years old. The patient has little information about her father. The patient had 2 brothers, no sisters. There is no history of breast or ovarian cancer in the family to her knowledge.   GYNECOLOGIC HISTORY:   (Reviewed 08/23/2013) Menarche age 49, first live birth age 43, the patient is GX P1. She went through the change of life approximately 1999. She did not take hormone replacement.  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 She is on disability throughout her treatment. She is single, lives alone with her [place]. The patient's son Denise Becker died at the age of 57 with acute leukemia. The patient has no grandchildren.    ADVANCED DIRECTIVES: Not in place, but the patient was given the appropriate documents to complete and notarize on her 03/16/2013 visit. She intends to name her  cousin, Denise Becker, as her healthcare power of attorney. She can be reached at [place]:  (Updated 08/23/2013) History  Substance Use Topics  . Smoking status: Former Smoker -- 3 years    Types: Cigarettes    Quit date: 04/12/2001  . Smokeless tobacco: Never Used     Comment: "casual smoker; pack would go stale on me"  . Alcohol Use: Yes     Comment: 10/25/2013 "might have a glass of wine a few times/year"     Colonoscopy: Not on file  PAP: UTD/Dr. Delsa Sale  Bone density:  Never  Lipid panel:  Dec 2014/Dr. Jackson-Moore   Allergies  Allergen Reactions  . Ciprofloxacin     Patient developed severe diarrhea on cipro    Current Outpatient Prescriptions  Medication Sig Dispense Refill  . amLODipine-olmesartan (AZOR) 10-40 MG per tablet Take 1 tablet by mouth every morning.      Marland Kitchen emollient (BIAFINE) cream Apply 90 g topically 2 (two) times daily.      . Multiple Vitamin (MULTIVITAMIN) capsule Take 1 capsule by mouth daily.      . naproxen sodium (ANAPROX) 220 MG tablet Take 220 mg by mouth 2 (two) times daily as needed.      . tobramycin-dexamethasone (  TOBRADEX) ophthalmic solution Place 1 drop into both eyes 2 (two) times daily.  5 mL  1  . Wound Dressings (RADIAGEL) GEL Apply topically 2 (two) times daily.       No current facility-administered medications for this visit.   Objective: Middle-aged [place] American woman in no acute distress ho appears stated age 29 Vitals:   01/16/14 1652  BP: 127/66  Pulse: 88  Temp: 98.4 F (36.9 C)  Resp: 20   Body mass index is 35.68 kg/(m^2).  ECOG: 1  Sclerae unicteric, pupils equal and reactive Oropharynx clear and moist No cervical or supraclavicular adenopathy Lungs no rales or rhonchi Heart regular rate and rhythm Abd soft, obese, nontender, positive bowel sounds MSK no focal spinal tenderness, no upper extremity lymphedema Neuro: nonfocal, well oriented, appropriate affect Breasts: The  right breast is status post mastectomy and radiation. The skin or the radiation port area is hyperpigmented. There is some desquamation of the right axilla. There is no evidence of residual or recurrent disease in the right axilla is otherwise benign. Left breast is unremarkable    LAB RESULTS:   Lab Results  Component Value Date   WBC 4.9 01/09/2014   NEUTROABS 2.8 01/09/2014   HGB 14.1 01/09/2014   HCT 42.2 01/09/2014   MCV 80.1 01/09/2014   PLT 247 01/09/2014      Chemistry      Component Value Date/Time   NA 141 01/09/2014 1036   NA 141 10/17/2013 1047   K 4.0 01/09/2014 1036   K 4.1 10/17/2013 1047   CL 104 10/17/2013 1047   CO2 24 01/09/2014 1036   CO2 23 10/17/2013 1047   BUN 15.7 01/09/2014 1036   BUN 15 10/17/2013 1047   CREATININE 0.8 01/09/2014 1036   CREATININE 0.65 10/25/2013 1530   CREATININE 0.75 03/02/2013 1135      Component Value Date/Time   CALCIUM 9.9 01/09/2014 1036   CALCIUM 9.6 10/17/2013 1047   ALKPHOS 94 01/09/2014 1036   ALKPHOS 102 10/17/2013 1047   AST 13 01/09/2014 1036   AST 21 10/17/2013 1047   ALT 20 01/09/2014 1036   ALT 27 10/17/2013 1047   BILITOT 0.23 01/09/2014 1036   BILITOT 0.3 10/17/2013 1047       STUDIES: No results found.  ASSESSMENT: 63 y.o. [place] woman   (1)  status post right breast biopsy 03/04/2013 for a clinical T3 N1, stage IIIA invasive ductal carcinoma, grade 3, estrogen receptor 100% positive, progesterone receptor 100% positive, with an MIB-1 of 20% and no HER-2 amplification.  (2) right axillary lymph node biopsy 03/04/2013 was negative  (3) neoadjuvant chemotherapy with docetaxel, doxorubicin and cyclophosphamide x6 cycles completed 08/23/2013  (4) status post right total mastectomy and right axillary sentinel node biopsy, ypT3 ypN0 7.2cm invasive lobular carcinoma, ER +, PR +, HER-2/neu negative, margins clear  (5) adjuvant radiation completed 01/17/2014  (6) to start tamoxifen 02/28/2014  (7) large  ventral hernia--surgical evaluation pending  PLAN: Denise Becker we'll complete her radiation treatments tomorrow. Generally she has tolerated them well. She is hoping to be able to go back to work in November, after she meets with Dr. Dalbert Batman later this month. There is still a concern regarding some fluid accumulation which might require surgical intervention.  We discussed the difference between systemic and local treatment. As far as systemic treatment is concerned she has completed her chemotherapy and is ready to start antiestrogen. We discussed the fact that estrogen sexually are more effective than chemotherapy at  keeping the cancer from recurring. We also discussed the difference between anastrozole and tamoxifen.  She has a good understanding of the possible toxicities, side effects and complications of these agents. After much discussion we decided we would start with tamoxifen. I would prefer that she not started until December 1, to give her time to get over the radiation fatigue, any other side effects that she may still be having as far as skin and so on, and also after she has had a chance to get back into a routine at work. She would like [name] to send the prescriptions through mail order and she will let us know which one. We will be glad to do that for her.  Accordingly I will see her again in February of 2016. If she is tolerating tamoxifen without significant side effects the plan will be to do that for 2 years and then consider switching to an aromatase inhibitor  Denise Becker is a good understanding of the overall plan. She agrees with it. She knows the goal of treatment in her case is cure. She will call if any problems that may develop before her next visit here. Chauncey Cruel, MD     01/16/2014 5:15 PM

## 2014-01-17 ENCOUNTER — Ambulatory Visit
Admission: RE | Admit: 2014-01-17 | Discharge: 2014-01-17 | Disposition: A | Discharge status: Home / Self Care | Payer: 59 | Source: Ambulatory Visit | Attending: Radiation Oncology | Admitting: Radiation Oncology

## 2014-01-17 ENCOUNTER — Ambulatory Visit: Payer: 59 | Admitting: Physical Therapy

## 2014-01-17 ENCOUNTER — Inpatient Hospital Stay
Admission: RE | Admit: 2014-01-17 | Discharge: 2014-01-17 | Disposition: A | Discharge status: Home / Self Care | Payer: 59 | Source: Ambulatory Visit | Attending: Radiation Oncology | Admitting: Radiation Oncology

## 2014-01-17 ENCOUNTER — Encounter: Payer: Self-pay | Admitting: Radiation Oncology

## 2014-01-17 VITALS — BP 126/68 | HR 82 | Temp 97.9°F | Wt 212.5 lb

## 2014-01-17 DIAGNOSIS — C50211 Malignant neoplasm of upper-inner quadrant of right female breast: Secondary | ICD-10-CM | POA: Diagnosis not present

## 2014-01-17 DIAGNOSIS — Z510 Encounter for antineoplastic radiation therapy: Secondary | ICD-10-CM | POA: Diagnosis not present

## 2014-01-17 DIAGNOSIS — C50411 Malignant neoplasm of upper-outer quadrant of right female breast: Secondary | ICD-10-CM

## 2014-01-17 DIAGNOSIS — C50111 Malignant neoplasm of central portion of right female breast: Secondary | ICD-10-CM

## 2014-01-17 MED ORDER — BIAFINE EX EMUL
CUTANEOUS | Status: DC | PRN
  Administered 2014-01-17: 11:00:00 via TOPICAL

## 2014-01-17 NOTE — Progress Notes (Signed)
Patient completes radiation to right chest wall.Hyperpigmentation with dry peel.Continue with biafne at least next 3 weeks then start lotion with vitamin e.Knows to call me if any questions or concerns.Increased fatigue should improve over next 6 weeks.One month follow up scheduled.Given another tube of biafine.

## 2014-01-17 NOTE — Progress Notes (Signed)
  Radiation Oncology         [phone] ________________________________  Name: Denise Becker MRN: [id number]  Date: 01/17/2014  DOB: October 01, 1950  Weekly Radiation Therapy Management  Breast cancer of upper-outer quadrant of right female breast   Primary site: Breast (Right)   Staging method: AJCC 7th Edition   Clinical: Stage IIB (T3, N0, cM0)   Summary: Stage IIB (T3, N0, cM0)   Clinical comments: Staged at breast conference 03/16/13.   Current Dose: 50.4 Gy     Planned Dose:  50.4 Gy  Narrative . . . . . . . . The patient presents for routine under treatment assessment.                                   The patient is be to complete her radiation therapy today. She does have some discomfort between the low axillary area.                                 Set-up films were reviewed.                                 The chart was checked. Physical Findings. . .  weight is 212 lb 8 oz (96.389 kg). Her temperature is 97.9 F (36.6 C). Her blood pressure is 126/68 and her pulse is 82. . The right chest wall area shows hyperpigmentation changes and dry desquamation. Impending moist desquamation in the upper outer aspect of the chest wall area. Impression . . . . . . . The patient is tolerating radiation. Plan . . . . . . . . . . . Marland Kitchen routine followup in one month. Patient will call sooner if she develops more significant skin reaction. She was advised to use Neosporin ointment if she develops moist desquamation  ________________________________   Blair Promise, PhD, MD

## 2014-01-21 NOTE — Progress Notes (Signed)
  Radiation Oncology         [phone] ________________________________  Name: Denise Becker MRN: [id number]  Date: 01/17/2014  DOB: 1950/08/31  End of Treatment Note  Diagnosis:   T3N0 Right breast cancer     Indication for treatment:  Curative       Radiation treatment dates:   12/08/2013-01/17/2014  Site/dose:    Right chest wall/50.4 Pearline Cables @ 1.8 Pearline Cables per fraction x 28 fractions  Beams/energy:  Opposed Tangents / 6, 10 and 15  MV photons  Narrative: The patient tolerated radiation treatment relatively well.   She had dry desquamation which was treated with radiaplex and biafene. She was evaluated by physical therapy and continued to work with them during RT.  She also had significant lower extremity edema which was evaluated by medical oncology and was slowly improving during treatment.   Plan: The patient has completed radiation treatment. The patient will return to radiation oncology clinic for routine followup in one month. I advised them to call or return sooner if they have any questions or concerns related to their recovery or treatment.  ------------------------------------------------  Thea Silversmith, MD

## 2014-01-23 ENCOUNTER — Ambulatory Visit: Payer: 59 | Admitting: Physical Therapy

## 2014-01-23 DIAGNOSIS — C50211 Malignant neoplasm of upper-inner quadrant of right female breast: Secondary | ICD-10-CM | POA: Diagnosis not present

## 2014-01-25 ENCOUNTER — Ambulatory Visit: Payer: 59

## 2014-01-25 DIAGNOSIS — C50211 Malignant neoplasm of upper-inner quadrant of right female breast: Secondary | ICD-10-CM | POA: Diagnosis not present

## 2014-02-04 NOTE — Progress Notes (Signed)
Radiation Oncology         [phone] ________________________________  Name: Denise Becker      MRN: [id number]          Date:11/29/13              DOB: Jun 23, 1950  Optical Surface Tracking Plan:  Since intensity modulated radiotherapy (IMRT) and 3D conformal radiation treatment methods are predicated on accurate and precise positioning for treatment, intrafraction motion monitoring is medically necessary to ensure accurate and safe treatment delivery.  The ability to quantify intrafraction motion without excessive ionizing radiation dose can only be performed with optical surface tracking. Accordingly, surface imaging offers the opportunity to obtain 3D measurements of patient position throughout IMRT and 3D treatments without excessive radiation exposure.  I am ordering optical surface tracking for this patient's upcoming course of radiotherapy. ________________________________ Signature   Reference:   Ursula Alert, J, et al. Surface imaging-based analysis of intrafraction motion for breast radiotherapy patients.Journal of [employer], n. 6, nov. 2014. ISSN 56701410.   Available at: <http://www.jacmp.org/index.php/jacmp/article/view/4957>.

## 2014-02-16 ENCOUNTER — Ambulatory Visit
Admission: RE | Admit: 2014-02-16 | Discharge: 2014-02-16 | Disposition: A | Discharge status: Home / Self Care | Payer: 59 | Source: Ambulatory Visit | Attending: Radiation Oncology | Admitting: Radiation Oncology

## 2014-02-16 ENCOUNTER — Other Ambulatory Visit: Payer: Self-pay | Admitting: *Deleted

## 2014-02-16 VITALS — BP 122/66 | HR 87 | Temp 98.4°F | Wt 207.3 lb

## 2014-02-16 DIAGNOSIS — C50411 Malignant neoplasm of upper-outer quadrant of right female breast: Secondary | ICD-10-CM

## 2014-02-16 MED ORDER — TAMOXIFEN CITRATE 20 MG PO TABS: 20.0000 mg | ORAL_TABLET | Freq: Every day | ORAL | Status: DC

## 2014-02-16 NOTE — Progress Notes (Signed)
Patient for routine one month follow up radiation treatment to right chest wall.No pain, just tightening of skin.Still has excess hyperpigmentation of skin, needs to continue biafine and another 1 to 2 weeks then switch to lotion with vitamin e.Denise dodd RN will take care of sending tamoxifen script to [facility] to start February 28, 2014 and follow up with med/onc in February.

## 2014-02-20 NOTE — Progress Notes (Signed)
   Department of Radiation Oncology  Phone:  [phone] Fax:        [phone]   Name: Denise Becker MRN: [id number]  DOB: [birth year]  Date: 02/16/2014  Follow Up Visit Note  Diagnosis: Breast cancer of upper-outer quadrant of right female breast   Staging form: Breast, AJCC 7th Edition     Clinical: Stage IIB (T3, N0, cM0) - Unsigned       Staging comments: Staged at breast conference 03/16/13.  Summary and Interval since last radiation: 1 month from 50.4 Gy to the right chest wall   Interval History: Denise Becker presents today for routine followup.  She is feeling well. Her legs are still giong down in terms of the swelling. She is supposed to start tamoxifen 12/1. She has a follow up with Dr. Jana Hakim in February. She has some hyperpigmentation. She is still using radiaplex.   Physical Exam:  Filed Vitals:   02/16/14 1511  BP: 122/66  Pulse: 87  Temp: 98.4 F (36.9 C)  Weight: 207 lb 4.8 oz (94.031 kg)   Hyperpigmentation over the right chest wall. No signs of recurrence. No moist desquamation. No hypopigmentation.   IMPRESSION: [name] is a 63 y.o. female s/p radiation with resolving acute effects fo treatment.   PLAN:  She is doing well. We discussed the need for follow up every 4-6 months which she has scheduled.  We discussed the need for yearly mammograms on the left which she can schedule with her OBGYN or with medical oncology. We discussed the need for sun protection in the treated area.  We discussed the use of Vitamine E to decrease scarring. She can always call me with questions.  I will follow up with her on an as needed basis.      Thea Silversmith, MD

## 2014-03-14 ENCOUNTER — Telehealth: Payer: Self-pay | Admitting: *Deleted

## 2014-03-14 NOTE — Telephone Encounter (Signed)
Patient is requesting lab work- for [employer]. She needs cholesterol /glucose. Lab appointment made.

## 2014-03-17 ENCOUNTER — Other Ambulatory Visit: Payer: 59

## 2014-03-17 DIAGNOSIS — Z026 Encounter for examination for insurance purposes: Secondary | ICD-10-CM

## 2014-03-17 LAB — GLUCOSE, RANDOM: GLUCOSE: 71 mg/dL (ref 70–99)

## 2014-03-17 LAB — CHOLESTEROL, TOTAL: CHOLESTEROL: 182 mg/dL (ref 0–200)

## 2014-03-27 ENCOUNTER — Encounter: Payer: Self-pay | Admitting: *Deleted

## 2014-05-10 ENCOUNTER — Other Ambulatory Visit (HOSPITAL_BASED_OUTPATIENT_CLINIC_OR_DEPARTMENT_OTHER): Payer: 59

## 2014-05-10 DIAGNOSIS — C50811 Malignant neoplasm of overlapping sites of right female breast: Secondary | ICD-10-CM

## 2014-05-10 DIAGNOSIS — C50411 Malignant neoplasm of upper-outer quadrant of right female breast: Secondary | ICD-10-CM

## 2014-05-10 LAB — CBC WITH DIFFERENTIAL/PLATELET
BASO%: 1.1 % (ref 0.0–2.0)
Basophils Absolute: 0.1 10*3/uL (ref 0.0–0.1)
EOS ABS: 0.2 10*3/uL (ref 0.0–0.5)
EOS%: 2.3 % (ref 0.0–7.0)
HCT: 40.1 % (ref 34.8–46.6)
HGB: 12.6 g/dL (ref 11.6–15.9)
LYMPH#: 1.3 10*3/uL (ref 0.9–3.3)
LYMPH%: 19.6 % (ref 14.0–49.7)
MCH: 26.2 pg (ref 25.1–34.0)
MCHC: 31.4 g/dL — ABNORMAL LOW (ref 31.5–36.0)
MCV: 83.3 fL (ref 79.5–101.0)
MONO#: 0.5 10*3/uL (ref 0.1–0.9)
MONO%: 6.9 % (ref 0.0–14.0)
NEUT%: 70.1 % (ref 38.4–76.8)
NEUTROS ABS: 4.8 10*3/uL (ref 1.5–6.5)
Platelets: 315 10*3/uL (ref 145–400)
RBC: 4.81 10*6/uL (ref 3.70–5.45)
RDW: 15.8 % — ABNORMAL HIGH (ref 11.2–14.5)
WBC: 6.8 10*3/uL (ref 3.9–10.3)

## 2014-05-10 LAB — COMPREHENSIVE METABOLIC PANEL (CC13)
ALK PHOS: 76 U/L (ref 40–150)
ALT: 11 U/L (ref 0–55)
AST: 10 U/L (ref 5–34)
Albumin: 3.3 g/dL — ABNORMAL LOW (ref 3.5–5.0)
Anion Gap: 11 mEq/L (ref 3–11)
BILIRUBIN TOTAL: 0.28 mg/dL (ref 0.20–1.20)
BUN: 18.7 mg/dL (ref 7.0–26.0)
CO2: 23 mEq/L (ref 22–29)
CREATININE: 1 mg/dL (ref 0.6–1.1)
Calcium: 9.1 mg/dL (ref 8.4–10.4)
Chloride: 106 mEq/L (ref 98–109)
EGFR: 66 mL/min/{1.73_m2} — AB (ref >90)
Glucose: 107 mg/dl (ref 70–140)
Potassium: 3.8 mEq/L (ref 3.5–5.1)
Sodium: 140 mEq/L (ref 136–145)
TOTAL PROTEIN: 7.6 g/dL (ref 6.4–8.3)

## 2014-05-17 ENCOUNTER — Telehealth: Payer: Self-pay | Admitting: Oncology

## 2014-05-17 ENCOUNTER — Ambulatory Visit (HOSPITAL_BASED_OUTPATIENT_CLINIC_OR_DEPARTMENT_OTHER): Payer: 59 | Admitting: Oncology

## 2014-05-17 ENCOUNTER — Other Ambulatory Visit: Payer: Self-pay | Admitting: Oncology

## 2014-05-17 VITALS — BP 126/72 | HR 79 | Temp 97.9°F | Resp 18 | Ht 65.0 in | Wt 199.9 lb

## 2014-05-17 DIAGNOSIS — I10 Essential (primary) hypertension: Secondary | ICD-10-CM

## 2014-05-17 DIAGNOSIS — C50411 Malignant neoplasm of upper-outer quadrant of right female breast: Secondary | ICD-10-CM

## 2014-05-17 DIAGNOSIS — Z170 Estrogen receptor positive status [ER+]: Secondary | ICD-10-CM

## 2014-05-17 DIAGNOSIS — Z1231 Encounter for screening mammogram for malignant neoplasm of breast: Secondary | ICD-10-CM

## 2014-05-17 DIAGNOSIS — Z9011 Acquired absence of right breast and nipple: Secondary | ICD-10-CM

## 2014-05-17 DIAGNOSIS — F4323 Adjustment disorder with mixed anxiety and depressed mood: Secondary | ICD-10-CM

## 2014-05-17 DIAGNOSIS — K439 Ventral hernia without obstruction or gangrene: Secondary | ICD-10-CM

## 2014-05-17 NOTE — Progress Notes (Signed)
ID: [name] OB: 04-26-1950  MR#: [id number]  CSN#:[id number]  PCP: No primary care provider on file. GYN:  Denise Crocker, MD SU: Denise Skates, MD OTHER MD: Denise Silversmith, MD  CHIEF COMPLAINT: Right breast cancer THERAPY: Tamoxifen  HISTORY OF PRESENT ILLNESS: From the initial intake note 03/16/2013:  Denise Becker palpated a change in her right breast sometime in August or September 2014. She was aware that this might be significant, but waited on meds until she saw her gynecologist for a routine visit. Dr. Delsa Sale immediately he set her up for right mammography and ultrasonography performed 03/03/2013. Mammography found an area of density in the upper outer quadrant of the right breast measuring 5 cm in the with right nipple retraction. There was no evidence of skin thickening. On exam there was a firm palpable mass in the superior right breast extending from 1:00 to 9:00. The right nipple was completely retracted. There was no skin thickening. The right axilla was benign by palpation. Ultrasound showed a prominent irregular hypoechoic mass larger than the ultrasound screen. Ultrasound of the right axilla showed 2 adjacent small lymph nodes with diffusely thickened cortices, the largest measuring 1.1 cm.  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 There was no HER-2 amplification with a ratio of 1.46 by CISH and an average copy number of 2.85.  On 03/11/2013 the patient underwent bilateral breast MRI. This showed an irregular enhancing mass in the central to upper right breast with nipple retraction measuring in total 6.8 cm. There were several adjacent enhancing nodules as  well. There was no involvement of the pectoralis muscle. There were no definite morphologically abnormal right axillary lymph nodes there was no definite internal mammary or lymphadenopathy. Left breast was unremarkable.  The patient's subsequent history is as detailed below   INTERVAL HISTORY: Denise Becker returns for follow up of her breast cancer. She started her tamoxifen adjuvant treatment 02/28/2014. She really has not noted any significant changes. She's not had any increase in hot flashes which in any case are minimal. She has had a mild vaginal discharge, but "not often". She is able to obtain the drug and I very reasonable price  REVIEW OF SYSTEMS: She went back to work late December and found that she was a bit out of shape. 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 She did have to have a repeat drain placed by Dr. Dalbert Batman for reaccumulating fluid under the mastectomy site, but that has entirely resolved. A detailed review of systems today was otherwise stable  PAST MEDICAL HISTORY: Past Medical History  Diagnosis Date  . Hypertension   . Anxiety     anxiousabout impending surgery- loss of breast  . H/O hiatal hernia   . Spontaneous dislocation of shoulder [phone]    "right; a few times over the years"  . GERD (gastroesophageal reflux disease)     "just chemo related"  . Arthritis     "joints hurt q now and then" (10/25/2013)  . Depression     history of depression after son died in the 2000's  . Breast cancer of upper-outer quadrant of right female breast 03/10/2013  . Radiation 12/08/13-01/17/14  Right chest wall 50.4 Gy    PAST SURGICAL HISTORY: Past Surgical History  Procedure Laterality Date  . Multiple tooth extractions Bilateral 2011-2014 X 6  . Tonsillectomy    . Cesarean section  1980  . Portacath placement Right 04/15/2013    Procedure: INSERTION PORT-A-CATH;  Surgeon:  Adin Hector, MD;  Location: [place];  Service: General;  Laterality: Right;  . Port a cath revision Right 04/29/2013    Procedure: PORT A CATH REVISION;  Surgeon: Adin Hector, MD;  Location: [place];  Service: General;  Laterality: Right;  . Mastectomy complete / simple w/ sentinel node biopsy Right 10/25/2013  . Port-a-cath removal  10/25/2013  . Wisdom tooth extraction    . Abdominal hysterectomy  1990's  . Breast biopsy Right 02/2013  . Mastectomy w/ sentinel node biopsy Right 10/25/2013    Procedure: RIGHT TOTAL MASTECTOMY WITH SENTINEL LYMPH NODE BIOPSY;  Surgeon: Adin Hector, MD;  Location: [place];  Service: General;  Laterality: Right;  . Port-a-cath removal N/A 10/25/2013    Procedure: REMOVAL PORT-A-CATH;  Surgeon: Adin Hector, MD;  Location: [place];  Service: General;  Laterality: N/A;    FAMILY HISTORY Family History  Problem Relation Age of Onset  . Cancer Mother   . Hypertension Mother   . Bowel Disease Mother   . Lung disease Father   . Heart disease Maternal Grandmother   . Heart disease Maternal Grandfather   the patient's mother,Denise Becker, had a lung mass but apparently died from unrelated causes (she was followed by Dr. Earlie Server here). She was 65 years old. The patient has little information about her father. The patient had 2 brothers, no sisters. There is no history of breast or ovarian cancer in the family to her knowledge.   GYNECOLOGIC HISTORY:   (Reviewed 08/23/2013) Menarche age 52, first live birth age 64, the patient is GX P1. She went through the change of life approximately 1999. She did not take hormone replacement.  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 She is on disability throughout her treatment. She is single, lives alone with her Gooding. The patient's son Denise Becker died at the age of 55 with acute  leukemia. The patient has no grandchildren.    ADVANCED DIRECTIVES: Not in place, but the patient was given the appropriate documents to complete and notarize on her 03/16/2013 visit. She intends to name her cousin, Denise Becker, as her healthcare power of attorney. She can be reached at [place]:  (Updated 08/23/2013) History  Substance Use Topics  . Smoking status: Former Smoker -- 3 years    Types: Cigarettes    Quit date: 04/12/2001  . Smokeless tobacco: Never Used     Comment: "casual smoker; pack would go stale on me"  . Alcohol Use: Yes     Comment: 10/25/2013 "might have a glass of wine a few times/year"     Colonoscopy: Not on file  PAP: UTD/Dr. Delsa Sale  Bone density:  Never  Lipid panel:  Dec 2014/Dr. Jackson-Moore   Allergies  Allergen Reactions  . Ciprofloxacin     Patient developed severe diarrhea on cipro    Current Outpatient Prescriptions  Medication Sig Dispense Refill  . amLODipine-olmesartan (AZOR) 10-40 MG per tablet Take 1 tablet by mouth every morning.    Marland Kitchen emollient (BIAFINE) cream Apply 90 g topically 2 (two) times  daily.    . Multiple Vitamin (MULTIVITAMIN) capsule Take 1 capsule by mouth daily.    . naproxen sodium (ANAPROX) 220 MG tablet Take 220 mg by mouth 2 (two) times daily as needed.    . tamoxifen (NOLVADEX) 20 MG tablet Take 1 tablet (20 mg total) by mouth daily. 30 tablet 12  . tobramycin-dexamethasone (TOBRADEX) ophthalmic solution Place 1 drop into both eyes 2 (two) times daily. 5 mL 1   No current facility-administered medications for this visit.   Objective: Middle-aged [place] American woman who appears well Filed Vitals:   05/17/14 1212  BP: 126/72  Pulse: 79  Temp: 97.9 F (36.6 C)  Resp: 18   Body mass index is 33.27 kg/(m^2).  ECOG: 1 Sclerae unicteric, EOMs intact Oropharynx clear, dentition in fair repair No cervical or supraclavicular adenopathy Lungs no rales or rhonchi Heart regular  rate and rhythm Abd soft, nontender, positive bowel sounds MSK no focal spinal tenderness, no upper extremity lymphedema Neuro: nonfocal, well oriented, appropriate affect Breasts: The right breast is status post mastectomy and radiation. There is significant hyperpigmentation. There is a little area of irregularity where she had the drain most recently. There is no evidence of local recurrence. The right axilla is benign. The left breast is unremarkable.   LAB RESULTS:   Lab Results  Component Value Date   WBC 6.8 05/10/2014   NEUTROABS 4.8 05/10/2014   HGB 12.6 05/10/2014   HCT 40.1 05/10/2014   MCV 83.3 05/10/2014   PLT 315 05/10/2014      Chemistry      Component Value Date/Time   NA 140 05/10/2014 1205   NA 141 10/17/2013 1047   K 3.8 05/10/2014 1205   K 4.1 10/17/2013 1047   CL 104 10/17/2013 1047   CO2 23 05/10/2014 1205   CO2 23 10/17/2013 1047   BUN 18.7 05/10/2014 1205   BUN 15 10/17/2013 1047   CREATININE 1.0 05/10/2014 1205   CREATININE 0.65 10/25/2013 1530   CREATININE 0.75 03/02/2013 1135      Component Value Date/Time   CALCIUM 9.1 05/10/2014 1205   CALCIUM 9.6 10/17/2013 1047   ALKPHOS 76 05/10/2014 1205   ALKPHOS 102 10/17/2013 1047   AST 10 05/10/2014 1205   AST 21 10/17/2013 1047   ALT 11 05/10/2014 1205   ALT 27 10/17/2013 1047   BILITOT 0.28 05/10/2014 1205   BILITOT 0.3 10/17/2013 1047       STUDIES: She is due for mammography this month.  ASSESSMENT: 64 y.o. Hodgkins woman   (1)  status post right breast biopsy 03/04/2013 for a clinical T3 N1, stage IIIA invasive ductal carcinoma, grade 3, estrogen receptor 100% positive, progesterone receptor 100% positive, with an MIB-1 of 20% and no HER-2 amplification.  (2) right axillary lymph node biopsy 03/04/2013 was negative  (3) neoadjuvant chemotherapy with docetaxel, doxorubicin and cyclophosphamide x6 cycles completed 08/23/2013  (4) status post right total mastectomy and right  axillary sentinel node biopsy, ypT3 ypN0 7.2cm invasive lobular carcinoma, ER +, PR +, HER-2/neu negative, margins clear  (5) adjuvant radiation completed 01/17/2014  (6) started tamoxifen 02/28/2014  (7) large ventral hernia  PLAN: Caysie is tolerating the tamoxifen well and the plan will be to continue the drug for at least 2 years. At that time we will obtain a bone density and consider switching to an aromatase inhibitor.  She has just started a good exercise and nutrition program and I affirmed and encourage that. Otherwise she is already  scheduled to see Dr. Dalbert Batman in March and her primary care physician in April. She will return to see [name] again in June. We will be seeing her on an every three-month basis for another year before switching to an every 6 month visit protocol.  Amor has a good understanding of the overall plan. She agrees with it. She knows the goal of treatment in her case is cure. She will call with any problems that may develop before her next visit here. Chauncey Cruel, MD     05/17/2014 12:18 PM

## 2014-05-17 NOTE — Telephone Encounter (Signed)
perpof to sch pt appt-gave pt copy of sch-sch mamma-gave pt # to [employer] to sch Breast MRI pt to do screening b4 sch

## 2014-05-26 ENCOUNTER — Ambulatory Visit: Payer: 59

## 2014-05-30 ENCOUNTER — Ambulatory Visit: Payer: 59

## 2014-05-30 ENCOUNTER — Ambulatory Visit
Admission: RE | Admit: 2014-05-30 | Discharge: 2014-05-30 | Disposition: A | Discharge status: Home / Self Care | Payer: 59 | Source: Ambulatory Visit | Attending: Oncology | Admitting: Oncology

## 2014-05-30 DIAGNOSIS — Z9011 Acquired absence of right breast and nipple: Secondary | ICD-10-CM

## 2014-05-30 DIAGNOSIS — Z1231 Encounter for screening mammogram for malignant neoplasm of breast: Secondary | ICD-10-CM

## 2014-06-21 ENCOUNTER — Other Ambulatory Visit: Payer: Self-pay | Admitting: Nurse Practitioner

## 2014-07-06 ENCOUNTER — Other Ambulatory Visit (INDEPENDENT_AMBULATORY_CARE_PROVIDER_SITE_OTHER): Payer: Self-pay | Admitting: General Surgery

## 2014-07-06 ENCOUNTER — Ambulatory Visit (HOSPITAL_COMMUNITY)
Admission: RE | Admit: 2014-07-06 | Discharge: 2014-07-06 | Disposition: A | Discharge status: Home / Self Care | Payer: 59 | Source: Ambulatory Visit | Attending: General Surgery | Admitting: General Surgery

## 2014-07-06 ENCOUNTER — Other Ambulatory Visit (HOSPITAL_COMMUNITY): Payer: Self-pay | Admitting: General Surgery

## 2014-07-06 DIAGNOSIS — M25521 Pain in right elbow: Secondary | ICD-10-CM

## 2014-07-06 DIAGNOSIS — M79621 Pain in right upper arm: Secondary | ICD-10-CM | POA: Diagnosis not present

## 2014-07-06 DIAGNOSIS — C50911 Malignant neoplasm of unspecified site of right female breast: Secondary | ICD-10-CM

## 2014-07-06 DIAGNOSIS — M79601 Pain in right arm: Secondary | ICD-10-CM

## 2014-07-06 NOTE — Addendum Note (Signed)
Addended by: Adin Hector on: 07/06/2014 10:57 AM   Modules accepted: Orders

## 2014-07-06 NOTE — Progress Notes (Signed)
VASCULAR LAB PRELIMINARY  PRELIMINARY  PRELIMINARY  PRELIMINARY  Right upper extremity venous Doppler completed.    Preliminary report:  There is no DVT or SVT noted in the right upper extremity.   Sister Carbone, RVT 07/06/2014, 2:45 PM

## 2014-07-31 ENCOUNTER — Other Ambulatory Visit: Payer: Self-pay

## 2014-07-31 ENCOUNTER — Other Ambulatory Visit: Payer: Self-pay | Admitting: *Deleted

## 2014-07-31 MED ORDER — TAMOXIFEN CITRATE 20 MG PO TABS: 20.0000 mg | ORAL_TABLET | Freq: Every day | ORAL | Status: DC

## 2014-07-31 NOTE — Telephone Encounter (Signed)
Fax rcvd from [facility] for 90 day supply mail order.  Last OV 2/147/16.  Next OV 09/25/14.  Chart reviewed.  New order place electronically.

## 2014-08-08 IMAGING — XA IR CENTRAL VENOUS CATHETER
4 series · 14 of 17 positions shown · IV contrast (omnipaque)
Comparison: none

CLINICAL DATA: Malfunctioning right-sided Port-A-Cath placed on
04/15/2013. History of breast carcinoma.

EXAM:
CONTRAST INJECTION OF PORT A CATH UNDER FLUOROSCOPY
CONTRAST:  5 mL Omnipaque 300
FLUOROSCOPY TIME:  18 seconds
PROCEDURE:
Contrast was administered via the indwelling port after it was
accessed. Fluoroscopic spot images were obtained of the catheter
during injection.

[Series 4: care body 4 · 3 of 18 frames shown (1 of 3)]
[frame 3/18]
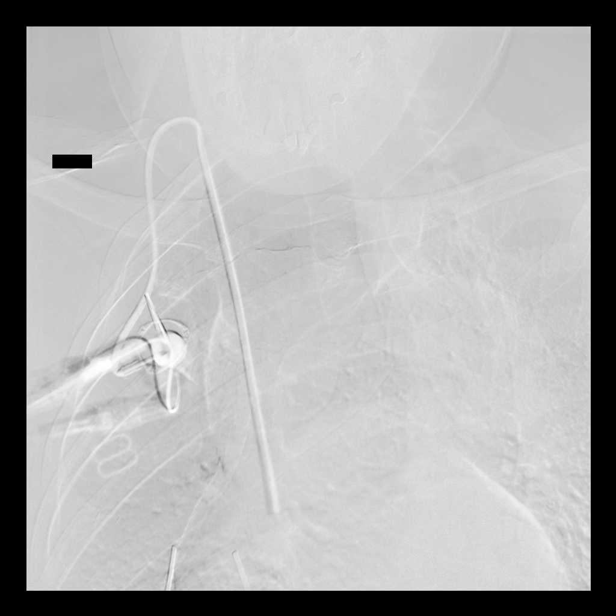
[frame 10/18]
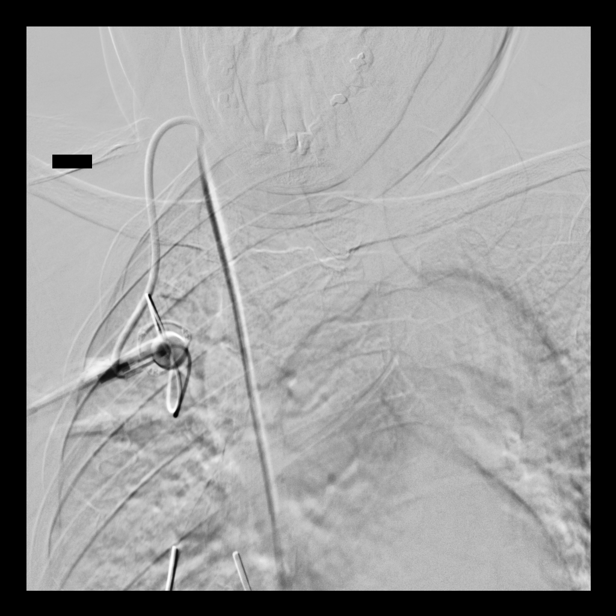
[frame 18/18]
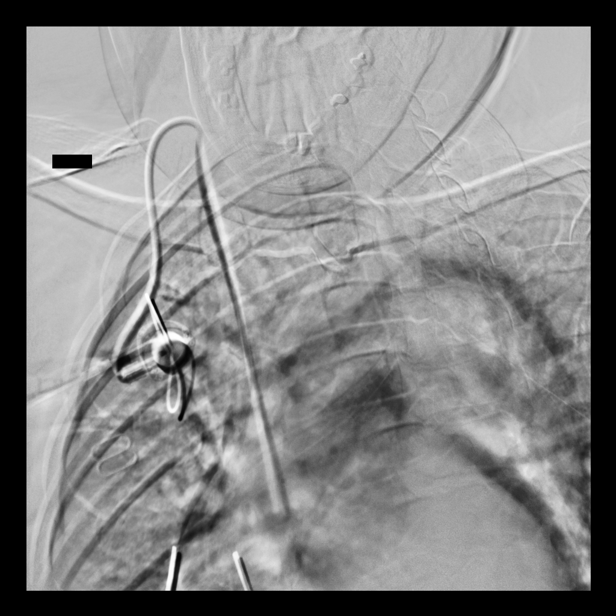

[Series 5: care body 4 · 3 of 18 frames shown (2 of 3)]
[frame 3/18]
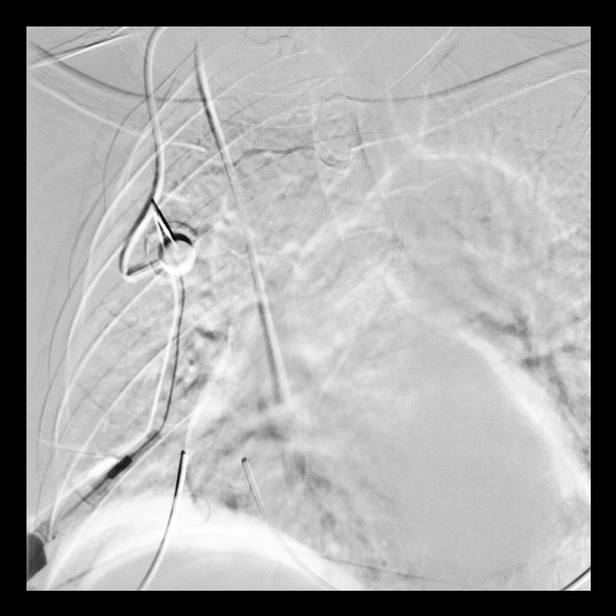
[frame 10/18]
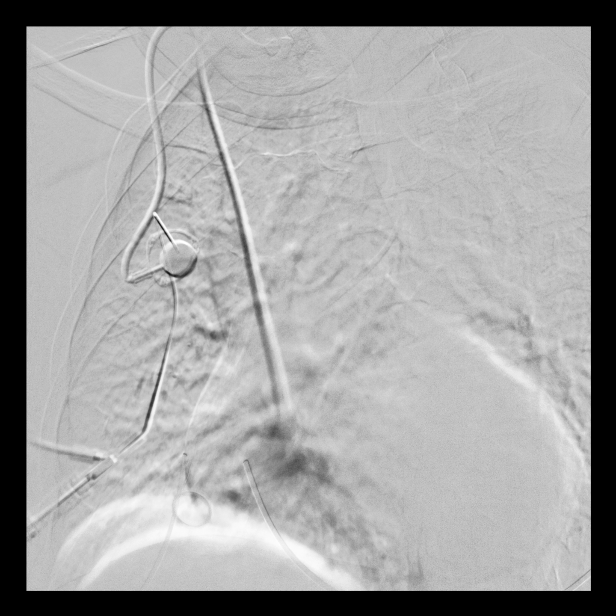
[frame 16/18]
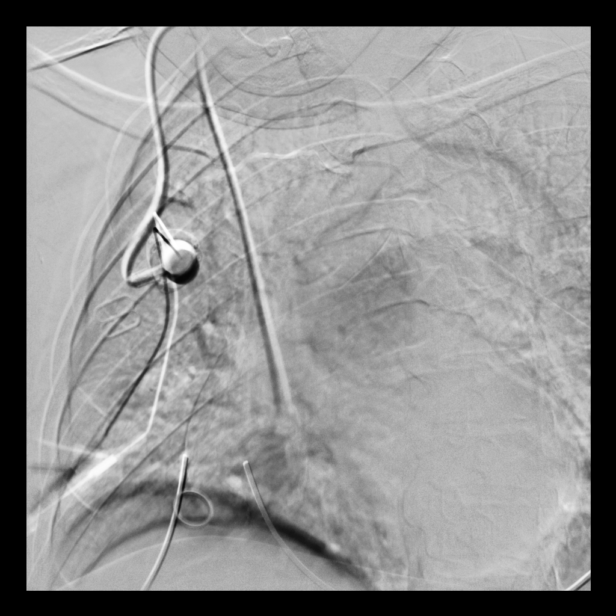

[Series 6: care body 4 · 3 of 12 frames shown (3 of 3)]
[frame 2/12]
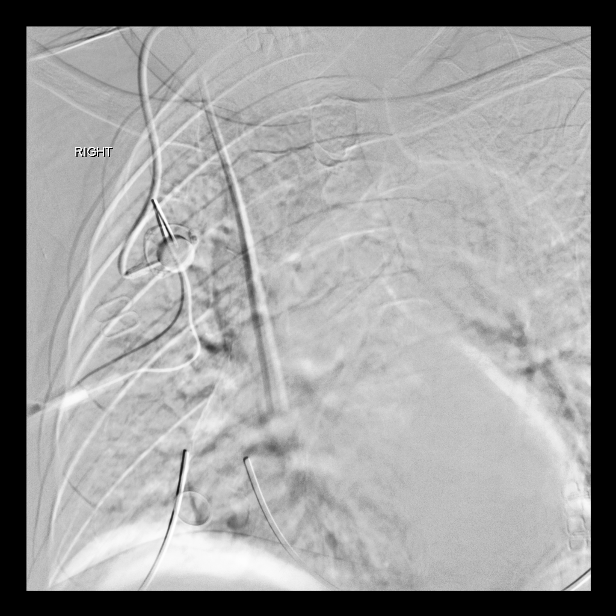
[frame 11/12]
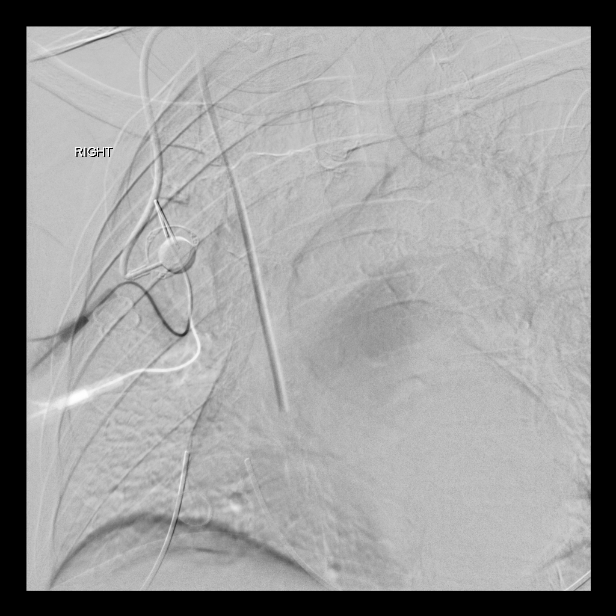
[frame 12/12]
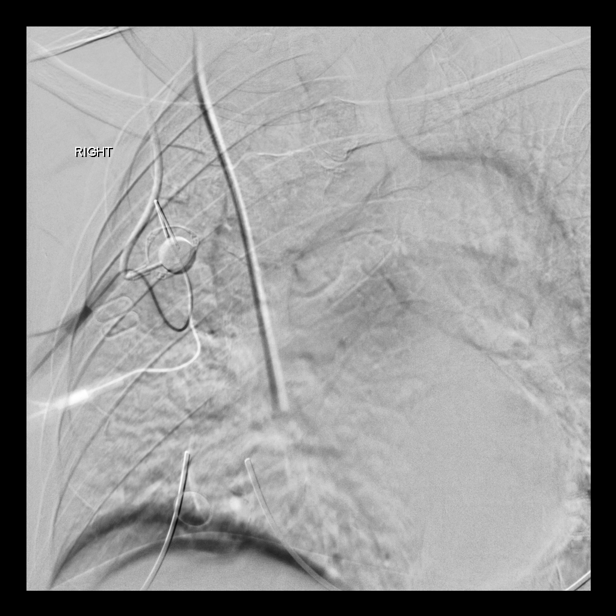

[Series 300: sp cv line injection (existing line) · 5 of 6 slices shown]
[im 1/6]
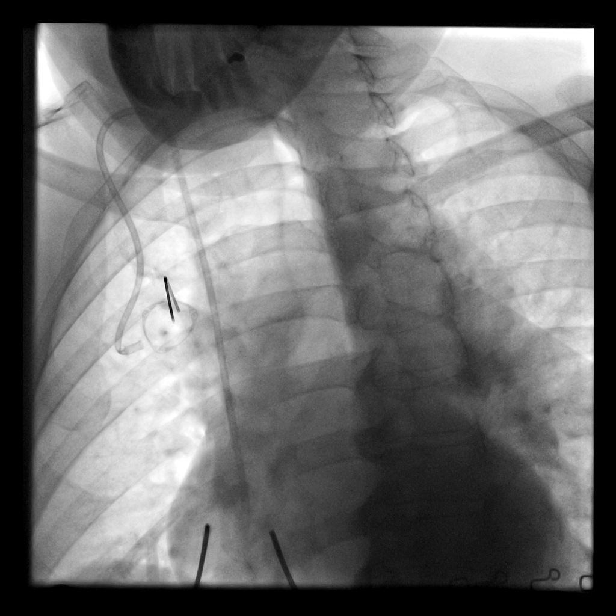
[im 2/6]
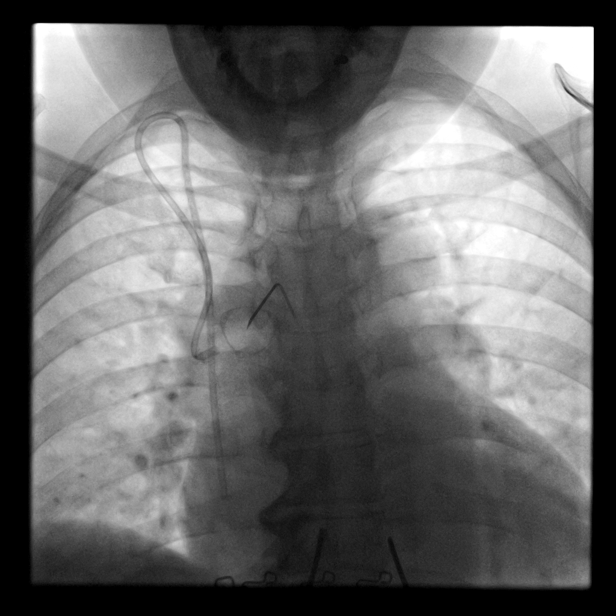
[im 3/6]
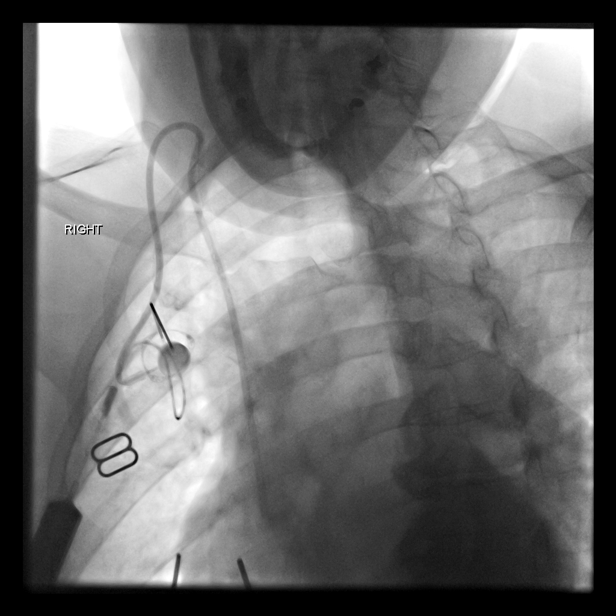
[im 5/6]
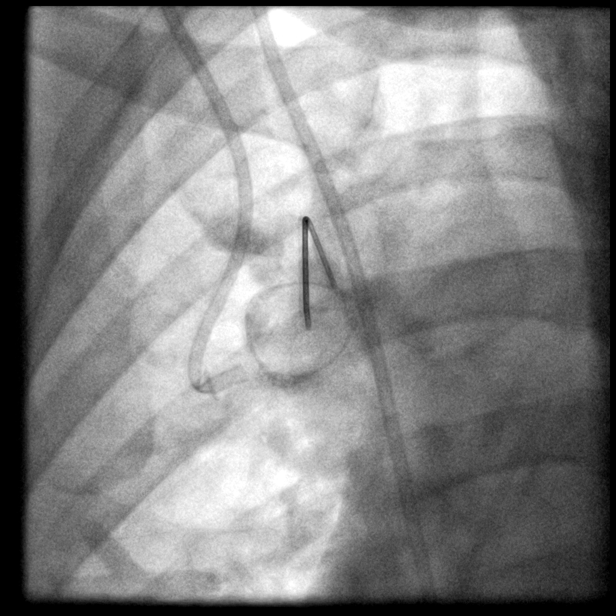
[im 6/6]
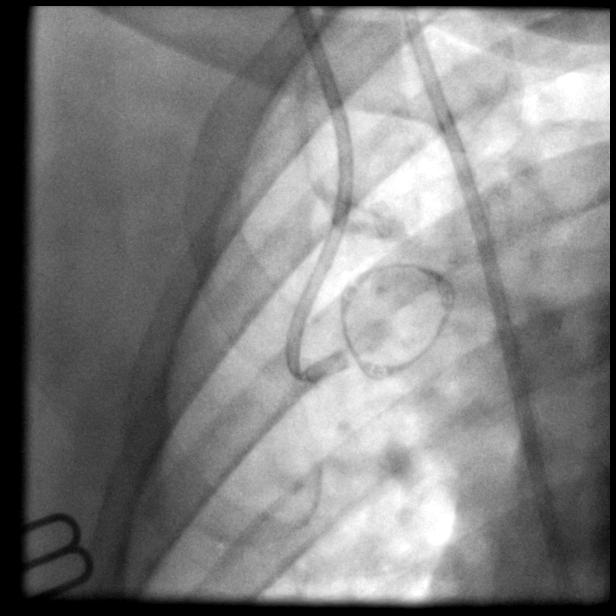

[14 of 17 positions shown; findings below may reference images not displayed]

FINDINGS: A right-sided Port-A-Cath is positioned in the chest wall pocket
such that the catheter stem projects laterally. This creates a
severe kink of the catheter just beyond the stem of the port. After
access of the catheter, it was very difficult to manually inject
contrast material due to resistance at the level of the kink.
Eventually, contrast did opacify the entire port lumen and attached
catheter and shows evidence of patency without extravasation of
contrast. The catheter tip is located at the cavoatrial junction.
There is no evidence of catheter related fibrin sheath.
IMPRESSION: Port orientation in the pocket laterally causes a severe and nearly
occlusive kink of the proximal tubing just beyond the stem of the
port. Contrast was able to be injected through the catheter which
shows patency.

## 2014-09-18 ENCOUNTER — Other Ambulatory Visit (HOSPITAL_BASED_OUTPATIENT_CLINIC_OR_DEPARTMENT_OTHER): Payer: 59

## 2014-09-18 DIAGNOSIS — C50411 Malignant neoplasm of upper-outer quadrant of right female breast: Secondary | ICD-10-CM

## 2014-09-18 LAB — COMPREHENSIVE METABOLIC PANEL (CC13)
ALT: 17 U/L (ref 0–55)
AST: 14 U/L (ref 5–34)
Albumin: 3.7 g/dL (ref 3.5–5.0)
Alkaline Phosphatase: 72 U/L (ref 40–150)
Anion Gap: 7 mEq/L (ref 3–11)
BUN: 12 mg/dL (ref 7.0–26.0)
CALCIUM: 9.7 mg/dL (ref 8.4–10.4)
CO2: 27 meq/L (ref 22–29)
Chloride: 106 mEq/L (ref 98–109)
Creatinine: 0.8 mg/dL (ref 0.6–1.1)
EGFR: >90 mL/min/{1.73_m2} (ref >90)
Glucose: 82 mg/dl (ref 70–140)
Potassium: 3.6 mEq/L (ref 3.5–5.1)
SODIUM: 141 meq/L (ref 136–145)
TOTAL PROTEIN: 8.2 g/dL (ref 6.4–8.3)
Total Bilirubin: 0.39 mg/dL (ref 0.20–1.20)

## 2014-09-18 LAB — CBC WITH DIFFERENTIAL/PLATELET
BASO%: 0.7 % (ref 0.0–2.0)
Basophils Absolute: 0 10*3/uL (ref 0.0–0.1)
EOS%: 1.5 % (ref 0.0–7.0)
Eosinophils Absolute: 0.1 10*3/uL (ref 0.0–0.5)
HCT: 42.6 % (ref 34.8–46.6)
HGB: 14 g/dL (ref 11.6–15.9)
LYMPH%: 30.2 % (ref 14.0–49.7)
MCH: 27.3 pg (ref 25.1–34.0)
MCHC: 32.9 g/dL (ref 31.5–36.0)
MCV: 83 fL (ref 79.5–101.0)
MONO#: 0.6 10*3/uL (ref 0.1–0.9)
MONO%: 10.3 % (ref 0.0–14.0)
NEUT%: 57.3 % (ref 38.4–76.8)
NEUTROS ABS: 3.5 10*3/uL (ref 1.5–6.5)
Platelets: 265 10*3/uL (ref 145–400)
RBC: 5.13 10*6/uL (ref 3.70–5.45)
RDW: 15.1 % — AB (ref 11.2–14.5)
WBC: 6.1 10*3/uL (ref 3.9–10.3)
lymph#: 1.8 10*3/uL (ref 0.9–3.3)

## 2014-09-25 ENCOUNTER — Encounter: Payer: Self-pay | Admitting: Nurse Practitioner

## 2014-09-25 ENCOUNTER — Other Ambulatory Visit: Payer: Self-pay | Admitting: *Deleted

## 2014-09-25 ENCOUNTER — Telehealth: Payer: Self-pay | Admitting: Nurse Practitioner

## 2014-09-25 ENCOUNTER — Ambulatory Visit (HOSPITAL_BASED_OUTPATIENT_CLINIC_OR_DEPARTMENT_OTHER): Payer: 59 | Admitting: Nurse Practitioner

## 2014-09-25 VITALS — BP 117/67 | HR 86 | Temp 98.3°F | Resp 18 | Wt 201.3 lb

## 2014-09-25 DIAGNOSIS — Z79810 Long term (current) use of selective estrogen receptor modulators (SERMs): Secondary | ICD-10-CM | POA: Diagnosis not present

## 2014-09-25 DIAGNOSIS — Z170 Estrogen receptor positive status [ER+]: Secondary | ICD-10-CM | POA: Diagnosis not present

## 2014-09-25 DIAGNOSIS — C50411 Malignant neoplasm of upper-outer quadrant of right female breast: Secondary | ICD-10-CM | POA: Diagnosis not present

## 2014-09-25 DIAGNOSIS — K439 Ventral hernia without obstruction or gangrene: Secondary | ICD-10-CM

## 2014-09-25 NOTE — Telephone Encounter (Signed)
Gave avs & calendar for October. °

## 2014-09-25 NOTE — Progress Notes (Signed)
ID: [name] OB: May 27, 1950  MR#: [id number]  JGG#:[id number]  PCP: Joya Gaskins GYN:  Lahoma Crocker, MD SU: Fanny Skates, MD OTHER MD: Thea Silversmith, MD  CHIEF COMPLAINT: Right breast cancer CURRENT TREATMENT: Tamoxifen  BREAST CANCER HISTORY: From the initial intake note 03/16/2013:  Hoyle Sauer palpated a change in her right breast sometime in August or September 2014. She was aware that this might be significant, but waited on meds until she saw her gynecologist for a routine visit. Dr. Delsa Sale immediately he set her up for right mammography and ultrasonography performed 03/03/2013. Mammography found an area of density in the upper outer quadrant of the right breast measuring 5 cm in the with right nipple retraction. There was no evidence of skin thickening. On exam there was a firm palpable mass in the superior right breast extending from 1:00 to 9:00. The right nipple was completely retracted. There was no skin thickening. The right axilla was benign by palpation. Ultrasound showed a prominent irregular hypoechoic mass larger than the ultrasound screen. Ultrasound of the right axilla showed 2 adjacent small lymph nodes with diffusely thickened cortices, the largest measuring 1.1 cm.  On 03/04/2013 the patient underwent biopsy of the right breast mass and low right axillary lymph node, with the pathology ([id number]) showing, in the breast, and invasive ductal carcinoma, grade 3, 100% estrogen receptor positive, and her percent progesterone receptor positive, both with strong staining intensity, and a proliferation marker of 20%. There was no HER-2 amplification with a ratio of 1.46 by CISH and an average copy number of 2.85.  On 03/11/2013 the patient underwent bilateral breast MRI. This showed an irregular enhancing mass in the central to upper right breast with nipple retraction measuring in total 6.8 cm. There were several adjacent enhancing nodules as well.  There was no involvement of the pectoralis muscle. There were no definite morphologically abnormal right axillary lymph nodes there was no definite internal mammary or lymphadenopathy. Left breast was unremarkable.  The patient's subsequent history is as detailed below   INTERVAL HISTORY: [place] returns for follow up of her breast cancer. She has been on tamoxifen since December 2015, and is tolerating it well. Her hot flashes and vaginal wetness have improved. She was stiff, but noticed this was alleviated by regular exercise. She has a Physiological scientist now and has lost 15lb. The interval history is otherwise unrmearkable.  REVIEW OF SYSTEMS: Khalise denies fevers, chills, nausea, vomiting, or changes in bowel or bladder habit. She denies shortness of breath, chest pain, cough, or palpitations. She still has chronic swelling to her bilateral lower extremities but this is somewhat better. She denies headaches, dizziness, weakness, or vision changes. Her right chest wall is numb, but she understands this is an after effect of chemo. A detailed review of systems is otherwise stable.  PAST MEDICAL HISTORY: Past Medical History  Diagnosis Date  . Hypertension   . Anxiety     anxiousabout impending surgery- loss of breast  . H/O hiatal hernia   . Spontaneous dislocation of shoulder [phone]    "right; a few times over the years"  . GERD (gastroesophageal reflux disease)     "just chemo related"  . Arthritis     "joints hurt q now and then" (10/25/2013)  . Depression     history of depression after son died in the 2000's  . Breast cancer of upper-outer quadrant of right female breast 03/10/2013  . Radiation 12/08/13-01/17/14    Right chest wall 50.4  Gy    PAST SURGICAL HISTORY: Past Surgical History  Procedure Laterality Date  . Multiple tooth extractions Bilateral 2011-2014 X 6  . Tonsillectomy    . Cesarean section  1980  . Portacath placement Right 04/15/2013    Procedure:  INSERTION PORT-A-CATH;  Surgeon: Adin Hector, MD;  Location: [facility];  Service: General;  Laterality: Right;  . Port a cath revision Right 04/29/2013    Procedure: PORT A CATH REVISION;  Surgeon: Adin Hector, MD;  Location: [facility];  Service: General;  Laterality: Right;  . Mastectomy complete / simple w/ sentinel node biopsy Right 10/25/2013  . Port-a-cath removal  10/25/2013  . Wisdom tooth extraction    . Abdominal hysterectomy  1990's  . Breast biopsy Right 02/2013  . Mastectomy w/ sentinel node biopsy Right 10/25/2013    Procedure: RIGHT TOTAL MASTECTOMY WITH SENTINEL LYMPH NODE BIOPSY;  Surgeon: Adin Hector, MD;  Location: [facility];  Service: General;  Laterality: Right;  . Port-a-cath removal N/A 10/25/2013    Procedure: REMOVAL PORT-A-CATH;  Surgeon: Adin Hector, MD;  Location: [facility];  Service: General;  Laterality: N/A;    FAMILY HISTORY Family History  Problem Relation Age of Onset  . Cancer Mother   . Hypertension Mother   . Bowel Disease Mother   . Lung disease Father   . Heart disease Maternal Grandmother   . Heart disease Maternal Grandfather   the patient's mother,Flowrene Millings, had a lung mass but apparently died from unrelated causes (she was followed by Dr. Earlie Server here). She was 65 years old. The patient has little information about her father. The patient had 2 brothers, no sisters. There is no history of breast or ovarian cancer in the family to her knowledge.   GYNECOLOGIC HISTORY:   (Reviewed 08/23/2013) Menarche age 64, first live birth age 49, the patient is GX P1. She went through the change of life approximately 1999. She did not take hormone replacement.  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 She is on disability throughout her treatment. She is single, lives alone with her [place]. The patient's son Helen Hashimoto died  at the age of 30 with acute leukemia. The patient has no grandchildren.    ADVANCED DIRECTIVES: Not in place, but the patient was given the appropriate documents to complete and notarize on her 03/16/2013 visit. She intends to name her cousin, Sharrie Rothman, as her healthcare power of attorney. She can be reached at [place]:  (Updated 08/23/2013) History  Substance Use Topics  . Smoking status: Former Smoker -- 3 years    Types: Cigarettes    Quit date: 04/12/2001  . Smokeless tobacco: Never Used     Comment: "casual smoker; pack would go stale on me"  . Alcohol Use: Yes     Comment: 10/25/2013 "might have a glass of wine a few times/year"     Colonoscopy: Not on file  PAP: UTD/Dr. Delsa Sale  Bone density:  Never  Lipid panel:  Dec 2014/Dr. Jackson-Moore   Allergies  Allergen Reactions  . Ciprofloxacin     Patient developed severe diarrhea on cipro    Current Outpatient Prescriptions  Medication Sig Dispense Refill  . amLODipine-olmesartan (AZOR) 10-40 MG per tablet Take 1 tablet by mouth every morning.    . Multiple Vitamin (MULTIVITAMIN) capsule Take 1 capsule by mouth daily.    Marland Kitchen  tamoxifen (NOLVADEX) 20 MG tablet Take 1 tablet (20 mg total) by mouth daily. 90 tablet 3  . naproxen sodium (ANAPROX) 220 MG tablet Take 220 mg by mouth 2 (two) times daily as needed.     No current facility-administered medications for this visit.   Objective: Middle-aged [place] American woman who appears well Filed Vitals:   09/25/14 1045  BP: 117/67  Pulse: 86  Temp: 98.3 F (36.8 C)  Resp: 18   Body mass index is 33.5 kg/(m^2).  ECOG: 1  Skin: warm, dry  HEENT: sclerae anicteric, conjunctivae pink, oropharynx clear. No thrush or mucositis.  Lymph Nodes: No cervical or supraclavicular lymphadenopathy  Lungs: clear to auscultation bilaterally, no rales, wheezes, or rhonci  Heart: regular rate and rhythm  Abdomen: round, soft, non tender, positive bowel  sounds  Musculoskeletal: No focal spinal tenderness, no peripheral edema  Neuro: non focal, well oriented, positive affect  Breasts: right breast status post mastectomy and radiation. Hyperpigmentation present but improving. No evidence of recurrent disease. Right axilla benign. Left breast unremarkable.  LAB RESULTS:   Lab Results  Component Value Date   WBC 6.1 09/18/2014   NEUTROABS 3.5 09/18/2014   HGB 14.0 09/18/2014   HCT 42.6 09/18/2014   MCV 83.0 09/18/2014   PLT 265 09/18/2014      Chemistry      Component Value Date/Time   NA 141 09/18/2014 1054   NA 141 10/17/2013 1047   K 3.6 09/18/2014 1054   K 4.1 10/17/2013 1047   CL 104 10/17/2013 1047   CO2 27 09/18/2014 1054   CO2 23 10/17/2013 1047   BUN 12.0 09/18/2014 1054   BUN 15 10/17/2013 1047   CREATININE 0.8 09/18/2014 1054   CREATININE 0.65 10/25/2013 1530   CREATININE 0.75 03/02/2013 1135      Component Value Date/Time   CALCIUM 9.7 09/18/2014 1054   CALCIUM 9.6 10/17/2013 1047   ALKPHOS 72 09/18/2014 1054   ALKPHOS 102 10/17/2013 1047   AST 14 09/18/2014 1054   AST 21 10/17/2013 1047   ALT 17 09/18/2014 1054   ALT 27 10/17/2013 1047   BILITOT 0.39 09/18/2014 1054   BILITOT 0.3 10/17/2013 1047       STUDIES: EXAM: DIGITAL SCREENING UNILATERAL LEFT MAMMOGRAM WITH TOMO AND CAD  COMPARISON: Previous exam(s).  ACR Breast Density Category c: The breast tissue is heterogeneously dense, which may obscure small masses.  FINDINGS: There are no findings suspicious for malignancy. Images were processed with CAD.  IMPRESSION: No mammographic evidence of malignancy. A result letter of this screening mammogram will be mailed directly to the patient.  RECOMMENDATION: Screening mammogram in one year. (Code:SM-B-01Y)  BI-RADS CATEGORY 1: Negative.   Electronically Signed  By: Curlene Dolphin M.D.  On: 05/30/2014 16:45  ASSESSMENT: 64 y.o. [place] woman   (1)  status post right  breast biopsy 03/04/2013 for a clinical T3 N1, stage IIIA invasive ductal carcinoma, grade 3, estrogen receptor 100% positive, progesterone receptor 100% positive, with an MIB-1 of 20% and no HER-2 amplification.  (2) right axillary lymph node biopsy 03/04/2013 was negative  (3) neoadjuvant chemotherapy with docetaxel, doxorubicin and cyclophosphamide x6 cycles completed 08/23/2013  (4) status post right total mastectomy and right axillary sentinel node biopsy, ypT3 ypN0 7.2cm invasive lobular carcinoma, ER +, PR +, HER-2/neu negative, margins clear  (5) adjuvant radiation completed 01/17/2014  (6) started tamoxifen 02/28/2014  (7) large ventral hernia  PLAN: Francisco is doing well as far as her breast cancer is  concerned. She is now 1 year out from her right mastectomy. She is tolerating the tamoxifen and will continue it for another year before considering switching to an aromatase inhibitor.  Her most recent mammogram was negative. Dr. Jana Hakim had written orders for a breast MRI because of her breast tissue density, but the patient declined. She states she has too much anxiety laying on her stomach for the procedure. I offered her an antianxiety med such as valium for the day of the scan, and she declined this too.   Stefanny will return in 3-4 months for labs and a follow up visit. She understands and agrees with this plan. She knows the goal of treatment in her case is cure. She has been encouraged to call with any issues that might arise before her next visit here.   Laurie Panda, NP     09/25/2014 11:18 AM

## 2015-01-17 ENCOUNTER — Other Ambulatory Visit: Payer: 59

## 2015-01-19 ENCOUNTER — Telehealth: Payer: Self-pay | Admitting: Oncology

## 2015-01-19 NOTE — Telephone Encounter (Signed)
returned call and lvm for pt to call back to r/s missed appt °

## 2015-01-23 ENCOUNTER — Other Ambulatory Visit: Payer: Self-pay | Admitting: *Deleted

## 2015-01-23 DIAGNOSIS — C50411 Malignant neoplasm of upper-outer quadrant of right female breast: Secondary | ICD-10-CM

## 2015-01-24 ENCOUNTER — Telehealth: Payer: Self-pay | Admitting: Oncology

## 2015-01-24 ENCOUNTER — Other Ambulatory Visit (HOSPITAL_BASED_OUTPATIENT_CLINIC_OR_DEPARTMENT_OTHER): Payer: 59

## 2015-01-24 ENCOUNTER — Ambulatory Visit (HOSPITAL_BASED_OUTPATIENT_CLINIC_OR_DEPARTMENT_OTHER): Payer: 59 | Admitting: Oncology

## 2015-01-24 VITALS — BP 137/71 | HR 70 | Temp 97.8°F | Resp 18 | Ht 65.0 in | Wt 201.4 lb

## 2015-01-24 DIAGNOSIS — C50411 Malignant neoplasm of upper-outer quadrant of right female breast: Secondary | ICD-10-CM | POA: Diagnosis not present

## 2015-01-24 DIAGNOSIS — Z170 Estrogen receptor positive status [ER+]: Secondary | ICD-10-CM | POA: Diagnosis not present

## 2015-01-24 DIAGNOSIS — Z79810 Long term (current) use of selective estrogen receptor modulators (SERMs): Secondary | ICD-10-CM | POA: Diagnosis not present

## 2015-01-24 LAB — COMPREHENSIVE METABOLIC PANEL (CC13)
ALBUMIN: 3.6 g/dL (ref 3.5–5.0)
ALK PHOS: 61 U/L (ref 40–150)
ALT: 17 U/L (ref 0–55)
ANION GAP: 6 meq/L (ref 3–11)
AST: 12 U/L (ref 5–34)
BUN: 15.3 mg/dL (ref 7.0–26.0)
CALCIUM: 9.7 mg/dL (ref 8.4–10.4)
CO2: 28 mEq/L (ref 22–29)
CREATININE: 0.9 mg/dL (ref 0.6–1.1)
Chloride: 107 mEq/L (ref 98–109)
EGFR: 74 mL/min/{1.73_m2} — ABNORMAL LOW (ref >90)
Glucose: 92 mg/dl (ref 70–140)
Potassium: 3.9 mEq/L (ref 3.5–5.1)
Sodium: 140 mEq/L (ref 136–145)
TOTAL PROTEIN: 7.9 g/dL (ref 6.4–8.3)
Total Bilirubin: 0.35 mg/dL (ref 0.20–1.20)

## 2015-01-24 LAB — CBC WITH DIFFERENTIAL/PLATELET
BASO%: 1.2 % (ref 0.0–2.0)
Basophils Absolute: 0.1 10*3/uL (ref 0.0–0.1)
EOS%: 2.4 % (ref 0.0–7.0)
Eosinophils Absolute: 0.1 10*3/uL (ref 0.0–0.5)
HEMATOCRIT: 44 % (ref 34.8–46.6)
HEMOGLOBIN: 14.3 g/dL (ref 11.6–15.9)
LYMPH#: 1.9 10*3/uL (ref 0.9–3.3)
LYMPH%: 38.9 % (ref 14.0–49.7)
MCH: 27.4 pg (ref 25.1–34.0)
MCHC: 32.4 g/dL (ref 31.5–36.0)
MCV: 84.5 fL (ref 79.5–101.0)
MONO#: 0.5 10*3/uL (ref 0.1–0.9)
MONO%: 11.2 % (ref 0.0–14.0)
NEUT%: 46.3 % (ref 38.4–76.8)
NEUTROS ABS: 2.3 10*3/uL (ref 1.5–6.5)
PLATELETS: 260 10*3/uL (ref 145–400)
RBC: 5.2 10*6/uL (ref 3.70–5.45)
RDW: 14.7 % — ABNORMAL HIGH (ref 11.2–14.5)
WBC: 4.9 10*3/uL (ref 3.9–10.3)

## 2015-01-24 MED ORDER — TAMOXIFEN CITRATE 20 MG PO TABS: 20.0000 mg | ORAL_TABLET | Freq: Every day | ORAL | Status: DC

## 2015-01-24 NOTE — Progress Notes (Signed)
ID: [name] OB: 09-06-1950  MR#: [id number]  CSN#:[id number]  PCP: Joya Gaskins GYN:  Lahoma Crocker, MD SU: Fanny Skates, MD OTHER MD: Thea Silversmith, MD  CHIEF COMPLAINT: Right breast cancer  CURRENT TREATMENT: Tamoxifen  BREAST CANCER HISTORY: From the initial intake note 03/16/2013:  Denise Becker palpated a change in her right breast sometime in August or September 2014. She was aware that this might be significant, but waited on meds until she saw her gynecologist for a routine visit. Dr. Delsa Sale immediately he set her up for right mammography and ultrasonography performed 03/03/2013. Mammography found an area of density in the upper outer quadrant of the right breast measuring 5 cm in the with right nipple retraction. There was no evidence of skin thickening. On exam there was a firm palpable mass in the superior right breast extending from 1:00 to 9:00. The right nipple was completely retracted. There was no skin thickening. The right axilla was benign by palpation. Ultrasound showed a prominent irregular hypoechoic mass larger than the ultrasound screen. Ultrasound of the right axilla showed 2 adjacent small lymph nodes with diffusely thickened cortices, the largest measuring 1.1 cm.  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 There was no HER-2 amplification with a ratio of 1.46 by CISH and an average copy number of 2.85.  On 03/11/2013 the patient underwent bilateral breast MRI. This showed an irregular enhancing mass in the central to upper right breast with nipple retraction measuring in total 6.8 cm. There were several adjacent enhancing nodules as well.  There was no involvement of the pectoralis muscle. There were no definite morphologically abnormal right axillary lymph nodes there was no definite internal mammary or lymphadenopathy. Left breast was unremarkable.  The patient's subsequent history is as detailed below   INTERVAL HISTORY: Denise Becker returns for follow up of her breast cancer. She continues on tamoxifen which she obtains had a very good price. She no longer has any problems with hot flashes or vaginal wetness. She does have some stiffness, which she blames on the tamoxifen, although that would be an unusual side effect for this drug.  REVIEW OF SYSTEMS: Denise Becker continues to work full-time but is considering retirement when she turned 52 later this year. Her big project is fixing up her mother's old home so she can move into that. Unfortunately her dog passed away 2 months ago. I have encouraged her to get a new one in February which is when people usually returned the Christmas dog given as a present to the pound. There is an area on the right chest wall that she wanted me to look at today. She has little bit of heartburn but otherwise a detailed review of systems today was noncontributory  PAST MEDICAL HISTORY: Past Medical History  Diagnosis Date  . Hypertension   . Anxiety     anxiousabout impending surgery- loss of breast  . H/O hiatal hernia   . Spontaneous dislocation of shoulder [phone]    "right; a few times over the years"  . GERD (gastroesophageal reflux disease)     "just chemo related"  . Arthritis     "joints hurt q now and then" (10/25/2013)  . Depression     history of depression  after son died in the 2000's  . Breast cancer of upper-outer quadrant of right female breast 03/10/2013  . Radiation 12/08/13-01/17/14    Right chest wall 50.4 Gy    PAST SURGICAL HISTORY: Past Surgical History  Procedure Laterality Date  . Multiple tooth extractions Bilateral 2011-2014 X 6  . Tonsillectomy    . Cesarean  section  1980  . Portacath placement Right 04/15/2013    Procedure: INSERTION PORT-A-CATH;  Surgeon: Adin Hector, MD;  Location: [facility];  Service: General;  Laterality: Right;  . Port a cath revision Right 04/29/2013    Procedure: PORT A CATH REVISION;  Surgeon: Adin Hector, MD;  Location: [facility];  Service: General;  Laterality: Right;  . Mastectomy complete / simple w/ sentinel node biopsy Right 10/25/2013  . Port-a-cath removal  10/25/2013  . Wisdom tooth extraction    . Abdominal hysterectomy  1990's  . Breast biopsy Right 02/2013  . Mastectomy w/ sentinel node biopsy Right 10/25/2013    Procedure: RIGHT TOTAL MASTECTOMY WITH SENTINEL LYMPH NODE BIOPSY;  Surgeon: Adin Hector, MD;  Location: [facility];  Service: General;  Laterality: Right;  . Port-a-cath removal N/A 10/25/2013    Procedure: REMOVAL PORT-A-CATH;  Surgeon: Adin Hector, MD;  Location: [facility];  Service: General;  Laterality: N/A;    FAMILY HISTORY Family History  Problem Relation Age of Onset  . Cancer Mother   . Hypertension Mother   . Bowel Disease Mother   . Lung disease Father   . Heart disease Maternal Grandmother   . Heart disease Maternal Grandfather   the patient's mother,Denise Becker, had a lung mass but apparently died from unrelated causes (she was followed by Dr. Earlie Server here). She was 65 years old. The patient has little information about her father. The patient had 2 brothers, no sisters. There is no history of breast or ovarian cancer in the family to her knowledge.   GYNECOLOGIC HISTORY:   (Reviewed 08/23/2013) Menarche age 11, first live birth age 70, the patient is GX P1. She went through the change of life approximately 1999. She did not take hormone replacement.  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 She was on disability throughout her treatment. She is single. Her  collie Denise Becker, died Nov 09, 2014.. The patient's son Denise Becker died at the age of 52 with acute leukemia. The patient has no grandchildren.    ADVANCED DIRECTIVES: Not in place, but the patient was given the appropriate documents to complete and notarize on her 03/16/2013 visit. She intends to name her cousin, Denise Becker, as her healthcare power of attorney. She can be reached at [place]:  (Updated 08/23/2013) Social History  Substance Use Topics  . Smoking status: Former Smoker -- 3 years    Types: Cigarettes    Quit date: 04/12/2001  . Smokeless tobacco: Never Used     Comment: "casual smoker; pack would go stale on me"  . Alcohol Use: Yes     Comment: 10/25/2013 "might have a glass of wine a few times/year"     Colonoscopy: Not on file  PAP: UTD/Dr. Delsa Sale  Bone density:  Never  Lipid panel:  Dec 2014/Dr. Jackson-Moore   Allergies  Allergen Reactions  . Ciprofloxacin     Patient developed severe diarrhea on cipro    Current Outpatient Prescriptions  Medication Sig Dispense Refill  . amLODipine-olmesartan (  AZOR) 10-40 MG per tablet Take 1 tablet by mouth every morning.    . Multiple Vitamin (MULTIVITAMIN) capsule Take 1 capsule by mouth daily.    . naproxen sodium (ANAPROX) 220 MG tablet Take 220 mg by mouth 2 (two) times daily as needed.    . tamoxifen (NOLVADEX) 20 MG tablet Take 1 tablet (20 mg total) by mouth daily. 90 tablet 3   No current facility-administered medications for this visit.   Objective: Middle-aged [place] American woman in no acute distress Filed Vitals:   01/24/15 1027  BP: 137/71  Pulse: 70  Temp: 97.8 F (36.6 C)  Resp: 18   Body mass index is 33.51 kg/(m^2).  ECOG: 1  Sclerae unicteric, EOMs intact Oropharynx clear, no thrush or other lesions No cervical or supraclavicular adenopathy Lungs no rales or rhonchi Heart regular rate and rhythm Abd soft, nontender, positive bowel sounds MSK no focal  spinal tenderness, no upper extremity lymphedema Neuro: nonfocal, well oriented, appropriate affect Breasts: The right breast is status post mastectomy and radiation. Towards the middle of the scar there is a small area of erosion, which is not raised and looks like nonviable tissue to me rather than a local recurrence. This has been present a few weeks, she said his. The right axilla is benign. The left breast is unremarkable.    LAB RESULTS:   Lab Results  Component Value Date   WBC 4.9 01/24/2015   NEUTROABS 2.3 01/24/2015   HGB 14.3 01/24/2015   HCT 44.0 01/24/2015   MCV 84.5 01/24/2015   PLT 260 01/24/2015      Chemistry      Component Value Date/Time   NA 141 09/18/2014 1054   NA 141 10/17/2013 1047   K 3.6 09/18/2014 1054   K 4.1 10/17/2013 1047   CL 104 10/17/2013 1047   CO2 27 09/18/2014 1054   CO2 23 10/17/2013 1047   BUN 12.0 09/18/2014 1054   BUN 15 10/17/2013 1047   CREATININE 0.8 09/18/2014 1054   CREATININE 0.65 10/25/2013 1530   CREATININE 0.75 03/02/2013 1135      Component Value Date/Time   CALCIUM 9.7 09/18/2014 1054   CALCIUM 9.6 10/17/2013 1047   ALKPHOS 72 09/18/2014 1054   ALKPHOS 102 10/17/2013 1047   AST 14 09/18/2014 1054   AST 21 10/17/2013 1047   ALT 17 09/18/2014 1054   ALT 27 10/17/2013 1047   BILITOT 0.39 09/18/2014 1054   BILITOT 0.3 10/17/2013 1047       STUDIES: EXAM: DIGITAL SCREENING UNILATERAL LEFT MAMMOGRAM WITH TOMO AND CAD  COMPARISON: Previous exam(s).  ACR Breast Density Category c: The breast tissue is heterogeneously dense, which may obscure small masses.  FINDINGS: There are no findings suspicious for malignancy. Images were processed with CAD.  IMPRESSION: No mammographic evidence of malignancy. A result letter of this screening mammogram will be mailed directly to the patient.  RECOMMENDATION: Screening mammogram in one year. (Code:SM-B-01Y)  BI-RADS CATEGORY 1: Negative.   Electronically  Signed  By: Curlene Dolphin M.D.  On: 05/30/2014 16:45  ASSESSMENT: 64 y.o. [place] woman   (1)  status post right breast biopsy 03/04/2013 for a clinical T3 N1, stage IIIA invasive ductal carcinoma, grade 3, estrogen receptor 100% positive, progesterone receptor 100% positive, with an MIB-1 of 20% and no HER-2 amplification.  (2) right axillary lymph node biopsy 03/04/2013 was negative  (3) neoadjuvant chemotherapy with docetaxel, doxorubicin and cyclophosphamide x6 cycles completed 08/23/2013  (4) status post right total mastectomy  and right axillary sentinel node biopsy, for a residual ypT3 ypN0 invasive lobular carcinoma, again estrogen and progesterone receptor positive and HER-2 nonamplified; with negative margins  (5) adjuvant radiation 12/08/2013-01/17/2014:  Right chest wall/50.4 Pearline Cables @ 1.8 Pearline Cables per fraction x 28 fractions  (6) started tamoxifen 02/28/2014  (7) large ventral hernia  PLAN: Rebeka is now nearly a year into her tamoxifen, with good tolerance. The stiffness that she complains about most likely is not related to that medication.  As far as the stiffness is concerned I think Tai Chi type exercise would be wonderful. This apparently is not offered at her gym, which does however have a yoga program.  Today I gave her a copy of the [employer] pamphlet and urged her to enroll. I think that would be helpful to her particularly as she is heading towards retirement, when she will have more time to work on her general health management.  I do not believe the area of erosion in the right breast scar area is going to proved to be local recurrence--I think it simply nonviable tissue secondary to the earlier treatment-- but I did photograph it today and if it does not clear in 2 or 3 weeks she is going to return to Dr. Dalbert Batman for biopsy.  She will see [name] again in 6 months for routine follow-up and then again in one year. When she sees me November 2017 we will consider  switching to an aromatase inhibitor.   Chauncey Cruel, MD     01/24/2015 10:31 AM

## 2015-01-24 NOTE — Telephone Encounter (Signed)
Appointments made and avs mailed to patient °

## 2015-05-02 ENCOUNTER — Other Ambulatory Visit: Payer: Self-pay

## 2015-05-02 DIAGNOSIS — C50411 Malignant neoplasm of upper-outer quadrant of right female breast: Secondary | ICD-10-CM

## 2015-05-07 ENCOUNTER — Other Ambulatory Visit: Payer: Self-pay

## 2015-05-07 DIAGNOSIS — C50411 Malignant neoplasm of upper-outer quadrant of right female breast: Secondary | ICD-10-CM

## 2015-05-07 MED ORDER — TAMOXIFEN CITRATE 20 MG PO TABS: 20.0000 mg | ORAL_TABLET | Freq: Every day | ORAL | Status: DC

## 2015-06-14 ENCOUNTER — Other Ambulatory Visit: Payer: Self-pay

## 2015-06-14 DIAGNOSIS — Z1231 Encounter for screening mammogram for malignant neoplasm of breast: Secondary | ICD-10-CM

## 2015-07-05 ENCOUNTER — Ambulatory Visit
Admission: RE | Admit: 2015-07-05 | Discharge: 2015-07-05 | Disposition: A | Discharge status: Home / Self Care | Payer: Medicare HMO | Source: Ambulatory Visit

## 2015-07-05 DIAGNOSIS — Z1231 Encounter for screening mammogram for malignant neoplasm of breast: Secondary | ICD-10-CM

## 2015-07-18 ENCOUNTER — Other Ambulatory Visit (HOSPITAL_BASED_OUTPATIENT_CLINIC_OR_DEPARTMENT_OTHER): Payer: Medicare HMO

## 2015-07-18 DIAGNOSIS — C50411 Malignant neoplasm of upper-outer quadrant of right female breast: Secondary | ICD-10-CM | POA: Diagnosis not present

## 2015-07-18 LAB — COMPREHENSIVE METABOLIC PANEL
ALT: 13 U/L (ref 0–55)
AST: 12 U/L (ref 5–34)
Albumin: 3.3 g/dL — ABNORMAL LOW (ref 3.5–5.0)
Alkaline Phosphatase: 60 U/L (ref 40–150)
Anion Gap: 9 mEq/L (ref 3–11)
BUN: 11.3 mg/dL (ref 7.0–26.0)
CALCIUM: 9.5 mg/dL (ref 8.4–10.4)
CHLORIDE: 109 meq/L (ref 98–109)
CO2: 23 mEq/L (ref 22–29)
Creatinine: 0.8 mg/dL (ref 0.6–1.1)
EGFR: 87 mL/min/{1.73_m2} — AB (ref >90)
Glucose: 147 mg/dl — ABNORMAL HIGH (ref 70–140)
POTASSIUM: 3.7 meq/L (ref 3.5–5.1)
SODIUM: 140 meq/L (ref 136–145)
Total Bilirubin: <0.3 mg/dL (ref 0.20–1.20)
Total Protein: 7.9 g/dL (ref 6.4–8.3)

## 2015-07-18 LAB — CBC WITH DIFFERENTIAL/PLATELET
BASO%: 0.3 % (ref 0.0–2.0)
BASOS ABS: 0 10*3/uL (ref 0.0–0.1)
EOS%: 2.6 % (ref 0.0–7.0)
Eosinophils Absolute: 0.2 10*3/uL (ref 0.0–0.5)
HEMATOCRIT: 40.3 % (ref 34.8–46.6)
HGB: 13.3 g/dL (ref 11.6–15.9)
LYMPH%: 40.4 % (ref 14.0–49.7)
MCH: 27.9 pg (ref 25.1–34.0)
MCHC: 33 g/dL (ref 31.5–36.0)
MCV: 84.5 fL (ref 79.5–101.0)
MONO#: 0.6 10*3/uL (ref 0.1–0.9)
MONO%: 10.2 % (ref 0.0–14.0)
NEUT#: 2.9 10*3/uL (ref 1.5–6.5)
NEUT%: 46.5 % (ref 38.4–76.8)
Platelets: 261 10*3/uL (ref 145–400)
RBC: 4.77 10*6/uL (ref 3.70–5.45)
RDW: 14.7 % — ABNORMAL HIGH (ref 11.2–14.5)
WBC: 6.3 10*3/uL (ref 3.9–10.3)
lymph#: 2.5 10*3/uL (ref 0.9–3.3)

## 2015-07-25 ENCOUNTER — Ambulatory Visit (HOSPITAL_BASED_OUTPATIENT_CLINIC_OR_DEPARTMENT_OTHER): Payer: Medicare HMO | Admitting: Nurse Practitioner

## 2015-07-25 ENCOUNTER — Encounter: Payer: Self-pay | Admitting: Nurse Practitioner

## 2015-07-25 VITALS — BP 137/69 | HR 65 | Temp 97.8°F | Resp 18 | Ht 65.0 in | Wt 219.8 lb

## 2015-07-25 DIAGNOSIS — Z170 Estrogen receptor positive status [ER+]: Secondary | ICD-10-CM

## 2015-07-25 DIAGNOSIS — Z79810 Long term (current) use of selective estrogen receptor modulators (SERMs): Secondary | ICD-10-CM | POA: Diagnosis not present

## 2015-07-25 DIAGNOSIS — K439 Ventral hernia without obstruction or gangrene: Secondary | ICD-10-CM

## 2015-07-25 DIAGNOSIS — C50411 Malignant neoplasm of upper-outer quadrant of right female breast: Secondary | ICD-10-CM

## 2015-07-25 NOTE — Progress Notes (Signed)
ID: Denise Becker OB: October 19, 1950  MR#: [id number]  UYE#:[id number]  PCP: Joya Gaskins GYN:  Lahoma Crocker, MD SU: Fanny Skates, MD OTHER MD: Thea Silversmith, MD  CHIEF COMPLAINT: Right breast cancer  CURRENT TREATMENT: Tamoxifen  BREAST CANCER HISTORY: From the initial intake note 03/16/2013:  Hoyle Sauer palpated a change in her right breast sometime in August or September 2014. She was aware that this might be significant, but waited on meds until she saw her gynecologist for a routine visit. Dr. Delsa Sale immediately he set her up for right mammography and ultrasonography performed 03/03/2013. Mammography found an area of density in the upper outer quadrant of the right breast measuring 5 cm in the with right nipple retraction. There was no evidence of skin thickening. On exam there was a firm palpable mass in the superior right breast extending from 1:00 to 9:00. The right nipple was completely retracted. There was no skin thickening. The right axilla was benign by palpation. Ultrasound showed a prominent irregular hypoechoic mass larger than the ultrasound screen. Ultrasound of the right axilla showed 2 adjacent small lymph nodes with diffusely thickened cortices, the largest measuring 1.1 cm.  On 03/04/2013 the patient underwent biopsy of the right breast mass and low right axillary lymph node, with the pathology ([id number]) showing, in the breast, and invasive ductal carcinoma, grade 3, 100% estrogen receptor positive, and her percent progesterone receptor positive, both with strong staining intensity, and a proliferation marker of 20%. There was no HER-2 amplification with a ratio of 1.46 by CISH and an average copy number of 2.85.  On 03/11/2013 the patient underwent bilateral breast MRI. This showed an irregular enhancing mass in the central to upper right breast with nipple retraction measuring in total 6.8 cm. There were several adjacent enhancing nodules as well.  There was no involvement of the pectoralis muscle. There were no definite morphologically abnormal right axillary lymph nodes there was no definite internal mammary or lymphadenopathy. Left breast was unremarkable.  The patient's subsequent history is as detailed below   INTERVAL HISTORY: Denise Becker returns for follow up of her breast cancer. She has been on tamoxifen since December 2015 and tolerates this drug well. She has some hot flashes, but they are tolerable. The interval history remarkable for her official retirement. She is enjoying herself, but is having to get used to a fixed income. Some of her medications have changed as a result.  REVIEW OF SYSTEMS: Baley has some stiffness to her joints. She takes naproxen PRN for this with good control. She has some heartburn. She has a large hernia that she expects to have repaired this year. 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 A detailed review of systems is otherwise stable.  PAST MEDICAL HISTORY: Past Medical History  Diagnosis Date  . Hypertension   . Anxiety     anxiousabout impending surgery- loss of breast  . H/O hiatal hernia   . Spontaneous dislocation of shoulder [phone]    "right; a few times over the years"  . GERD (gastroesophageal reflux disease)     "just chemo related"  . Arthritis     "joints hurt q now and then" (10/25/2013)  . Depression     history of depression after son died in the 2000's  . Breast cancer of upper-outer quadrant of right female breast 03/10/2013  . Radiation 12/08/13-01/17/14    Right chest wall 50.4 Gy    PAST SURGICAL  HISTORY: Past Surgical History  Procedure Laterality Date  . Multiple tooth extractions Bilateral 2011-2014 X 6  . Tonsillectomy    . Cesarean section  1980  . Portacath placement Right 04/15/2013    Procedure: INSERTION PORT-A-CATH;  Surgeon: Adin Hector, MD;  Location: [facility];  Service: General;   Laterality: Right;  . Port a cath revision Right 04/29/2013    Procedure: PORT A CATH REVISION;  Surgeon: Adin Hector, MD;  Location: [facility];  Service: General;  Laterality: Right;  . Mastectomy complete / simple w/ sentinel node biopsy Right 10/25/2013  . Port-a-cath removal  10/25/2013  . Wisdom tooth extraction    . Abdominal hysterectomy  1990's  . Breast biopsy Right 02/2013  . Mastectomy w/ sentinel node biopsy Right 10/25/2013    Procedure: RIGHT TOTAL MASTECTOMY WITH SENTINEL LYMPH NODE BIOPSY;  Surgeon: Adin Hector, MD;  Location: [facility];  Service: General;  Laterality: Right;  . Port-a-cath removal N/A 10/25/2013    Procedure: REMOVAL PORT-A-CATH;  Surgeon: Adin Hector, MD;  Location: [facility];  Service: General;  Laterality: N/A;    FAMILY HISTORY Family History  Problem Relation Age of Onset  . Cancer Mother   . Hypertension Mother   . Bowel Disease Mother   . Lung disease Father   . Heart disease Maternal Grandmother   . Heart disease Maternal Grandfather   the patient's mother,Denise Becker, had a lung mass but apparently died from unrelated causes (she was followed by Dr. Earlie Server here). She was 66 years old. The patient has little information about her father. The patient had 2 brothers, no sisters. There is no history of breast or ovarian cancer in the family to her knowledge.   GYNECOLOGIC HISTORY:   (Reviewed 08/23/2013) Menarche age 31, first live birth age 35, the patient is GX P1. She went through the change of life approximately 1999. She did not take hormone replacement.  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 She was on disability throughout her treatment. She is single. Her collie Delana Meyer, died 11/21/2014.. The patient's son Denise Becker died at the age of 76 with acute leukemia. The patient has no grandchildren.    ADVANCED DIRECTIVES: Not in  place, but the patient was given the appropriate documents to complete and notarize on her 03/16/2013 visit. She intends to name her cousin, Denise Becker, as her healthcare power of attorney. She can be reached at [place]:  (Updated 08/23/2013) Social History  Substance Use Topics  . Smoking status: Former Smoker -- 3 years    Types: Cigarettes    Quit date: 04/12/2001  . Smokeless tobacco: Never Used     Comment: "casual smoker; pack would go stale on me"  . Alcohol Use: Yes     Comment: 10/25/2013 "might have a glass of wine a few times/year"     Colonoscopy: Not on file  PAP: UTD/Dr. Delsa Sale  Bone density:  Never  Lipid panel:  Dec 2014/Dr. Jackson-Moore   Allergies  Allergen Reactions  . Ciprofloxacin     Patient developed severe diarrhea on cipro    Current Outpatient Prescriptions  Medication Sig Dispense Refill  . amLODipine (NORVASC) 10 MG tablet Take 10 mg by mouth daily.     Marland Kitchen losartan (COZAAR) 100 MG tablet Take 100 mg by mouth daily.     . Multiple Vitamin (MULTIVITAMIN) capsule  Take 1 capsule by mouth daily.    . naproxen sodium (ANAPROX) 220 MG tablet Take 220 mg by mouth 2 (two) times daily as needed.    . tamoxifen (NOLVADEX) 20 MG tablet Take 1 tablet (20 mg total) by mouth daily. 90 tablet 3   No current facility-administered medications for this visit.   Objective: Middle-aged [place] American woman in no acute distress Filed Vitals:   07/25/15 1142  BP: 137/69  Pulse: 65  Temp: 97.8 F (36.6 C)  Resp: 18   Body mass index is 36.58 kg/(m^2).  ECOG: 1  Skin: warm, dry  HEENT: sclerae anicteric, conjunctivae pink, oropharynx clear. No thrush or mucositis.  Lymph Nodes: No cervical or supraclavicular lymphadenopathy  Lungs: clear to auscultation bilaterally, no rales, wheezes, or rhonci  Heart: regular rate and rhythm  Abdomen: round, soft, non tender, positive bowel sounds  Musculoskeletal: No focal spinal  tenderness, no peripheral edema  Neuro: non focal, well oriented, positive affect  Breasts: right breast status post lumpectomy and radiation. No evidence of recurrent disease. Right axilla benign. Left breast unremarkable.    LAB RESULTS:   Lab Results  Component Value Date   WBC 6.3 07/18/2015   NEUTROABS 2.9 07/18/2015   HGB 13.3 07/18/2015   HCT 40.3 07/18/2015   MCV 84.5 07/18/2015   PLT 261 07/18/2015      Chemistry      Component Value Date/Time   NA 140 07/18/2015 1059   NA 141 10/17/2013 1047   K 3.7 07/18/2015 1059   K 4.1 10/17/2013 1047   CL 104 10/17/2013 1047   CO2 23 07/18/2015 1059   CO2 23 10/17/2013 1047   BUN 11.3 07/18/2015 1059   BUN 15 10/17/2013 1047   CREATININE 0.8 07/18/2015 1059   CREATININE 0.65 10/25/2013 1530   CREATININE 0.75 03/02/2013 1135      Component Value Date/Time   CALCIUM 9.5 07/18/2015 1059   CALCIUM 9.6 10/17/2013 1047   ALKPHOS 60 07/18/2015 1059   ALKPHOS 102 10/17/2013 1047   AST 12 07/18/2015 1059   AST 21 10/17/2013 1047   ALT 13 07/18/2015 1059   ALT 27 10/17/2013 1047   BILITOT <0.30 07/18/2015 1059   BILITOT 0.3 10/17/2013 1047       STUDIES: Mm Screening Breast Tomo Uni L  07/05/2015  CLINICAL DATA:  Screening. History of right breast cancer status post mastectomy. EXAM: 2D DIGITAL SCREENING UNILATERAL LEFT MAMMOGRAM WITH CAD AND ADJUNCT TOMO COMPARISON:  Previous exam(s). ACR Breast Density Category c: The breast tissue is heterogeneously dense, which may obscure small masses. FINDINGS: There are no findings suspicious for malignancy within the left breast. Images were processed with CAD. IMPRESSION: No mammographic evidence of malignancy. A result letter of this screening mammogram will be mailed directly to the patient. RECOMMENDATION: Screening mammogram in one year. (Code:SM-B-01Y) BI-RADS CATEGORY  1: Negative. Electronically Signed   By: Franki Cabot M.D.   On: 07/05/2015 13:38    ASSESSMENT: 65 y.o.  [place] woman   (1)  status post right breast biopsy 03/04/2013 for a clinical T3 N1, stage IIIA invasive ductal carcinoma, grade 3, estrogen receptor 100% positive, progesterone receptor 100% positive, with an MIB-1 of 20% and no HER-2 amplification.  (2) right axillary lymph node biopsy 03/04/2013 was negative  (3) neoadjuvant chemotherapy with docetaxel, doxorubicin and cyclophosphamide x6 cycles completed 08/23/2013  (4) status post right total mastectomy and right axillary sentinel node biopsy, for a residual ypT3 ypN0 invasive lobular carcinoma,  again estrogen and progesterone receptor positive and HER-2 nonamplified; with negative margins  (5) adjuvant radiation 12/08/2013-01/17/2014:  Right chest wall/50.4 Pearline Cables @ 1.8 Pearline Cables per fraction x 28 fractions  (6) started tamoxifen 02/28/2014  (7) large ventral hernia  PLAN: Minyon is doing well as far as her breast cancer is concerned. We reviewed the results of her most recent mammogram, which was benign. The labs were reviewed in detail and were stable. She is tolerating the tamoxifen reasonably well and will continue this drug through this Fall. At that point we will consider switching to an aromatase inhibitor.   Chiffon is already scheduled follow up with Dr. Jana Hakim this November. She understands and agrees with this plan. She knows the goal of treatment in her case is cure. She has been encouraged to call with any issues that might arise before her next visit here.    Laurie Panda, NP  07/25/2015 12:12 PM

## 2016-02-18 ENCOUNTER — Other Ambulatory Visit: Payer: Self-pay | Admitting: *Deleted

## 2016-02-18 DIAGNOSIS — Z170 Estrogen receptor positive status [ER+]: Principal | ICD-10-CM

## 2016-02-18 DIAGNOSIS — C50411 Malignant neoplasm of upper-outer quadrant of right female breast: Secondary | ICD-10-CM

## 2016-02-19 ENCOUNTER — Other Ambulatory Visit (HOSPITAL_BASED_OUTPATIENT_CLINIC_OR_DEPARTMENT_OTHER): Payer: Medicare HMO

## 2016-02-19 DIAGNOSIS — C50411 Malignant neoplasm of upper-outer quadrant of right female breast: Secondary | ICD-10-CM | POA: Diagnosis not present

## 2016-02-19 DIAGNOSIS — Z170 Estrogen receptor positive status [ER+]: Principal | ICD-10-CM

## 2016-02-19 LAB — CBC WITH DIFFERENTIAL/PLATELET
BASO%: 0.2 % (ref 0.0–2.0)
Basophils Absolute: 0 10*3/uL (ref 0.0–0.1)
EOS ABS: 0.2 10*3/uL (ref 0.0–0.5)
EOS%: 1.9 % (ref 0.0–7.0)
HCT: 39.9 % (ref 34.8–46.6)
HGB: 13.3 g/dL (ref 11.6–15.9)
LYMPH%: 28.5 % (ref 14.0–49.7)
MCH: 28.2 pg (ref 25.1–34.0)
MCHC: 33.3 g/dL (ref 31.5–36.0)
MCV: 84.7 fL (ref 79.5–101.0)
MONO#: 0.9 10*3/uL (ref 0.1–0.9)
MONO%: 9.7 % (ref 0.0–14.0)
NEUT%: 59.7 % (ref 38.4–76.8)
NEUTROS ABS: 5.4 10*3/uL (ref 1.5–6.5)
Platelets: 261 10*3/uL (ref 145–400)
RBC: 4.71 10*6/uL (ref 3.70–5.45)
RDW: 14.4 % (ref 11.2–14.5)
WBC: 9 10*3/uL (ref 3.9–10.3)
lymph#: 2.6 10*3/uL (ref 0.9–3.3)

## 2016-02-19 LAB — COMPREHENSIVE METABOLIC PANEL
ALT: 17 U/L (ref 0–55)
AST: 14 U/L (ref 5–34)
Albumin: 3.4 g/dL — ABNORMAL LOW (ref 3.5–5.0)
Alkaline Phosphatase: 68 U/L (ref 40–150)
Anion Gap: 9 mEq/L (ref 3–11)
BUN: 13.5 mg/dL (ref 7.0–26.0)
CHLORIDE: 107 meq/L (ref 98–109)
CO2: 26 meq/L (ref 22–29)
Calcium: 9.4 mg/dL (ref 8.4–10.4)
Creatinine: 1 mg/dL (ref 0.6–1.1)
EGFR: 71 mL/min/{1.73_m2} — ABNORMAL LOW (ref >90)
Glucose: 83 mg/dl (ref 70–140)
POTASSIUM: 3.5 meq/L (ref 3.5–5.1)
SODIUM: 141 meq/L (ref 136–145)
Total Bilirubin: 0.39 mg/dL (ref 0.20–1.20)
Total Protein: 8.1 g/dL (ref 6.4–8.3)

## 2016-02-26 ENCOUNTER — Ambulatory Visit (HOSPITAL_BASED_OUTPATIENT_CLINIC_OR_DEPARTMENT_OTHER): Payer: Medicare HMO | Admitting: Oncology

## 2016-02-26 VITALS — BP 152/65 | HR 82 | Temp 97.7°F | Resp 18 | Ht 65.0 in | Wt 227.8 lb

## 2016-02-26 DIAGNOSIS — C50111 Malignant neoplasm of central portion of right female breast: Secondary | ICD-10-CM

## 2016-02-26 DIAGNOSIS — C50811 Malignant neoplasm of overlapping sites of right female breast: Secondary | ICD-10-CM

## 2016-02-26 DIAGNOSIS — Z79811 Long term (current) use of aromatase inhibitors: Secondary | ICD-10-CM

## 2016-02-26 DIAGNOSIS — C50411 Malignant neoplasm of upper-outer quadrant of right female breast: Secondary | ICD-10-CM

## 2016-02-26 DIAGNOSIS — Z170 Estrogen receptor positive status [ER+]: Secondary | ICD-10-CM

## 2016-02-26 MED ORDER — ANASTROZOLE 1 MG PO TABS: 1.0000 mg | ORAL_TABLET | Freq: Every day | ORAL | 4 refills | Status: DC

## 2016-02-26 NOTE — Progress Notes (Signed)
ID: Denise Becker OB: [birth year]  MR#: [id number]  JSE#:[id number]  PCP: Joya Gaskins GYN:  Lahoma Crocker, MD SU: Fanny Skates, MD OTHER MD: Thea Silversmith, MD  CHIEF COMPLAINT: Right breast cancer  CURRENT TREATMENT:  To start anastrozole February 2018  BREAST CANCER HISTORY: From the initial intake note 03/16/2013:  Denise Becker palpated a change in her right breast sometime in August or September 2014. She was aware that this might be significant, but waited on meds until she saw her gynecologist for a routine visit. Dr. Delsa Sale immediately he set her up for right mammography and ultrasonography performed 03/03/2013. Mammography found an area of density in the upper outer quadrant of the right breast measuring 5 cm in the with right nipple retraction. There was no evidence of skin thickening. On exam there was a firm palpable mass in the superior right breast extending from 1:00 to 9:00. The right nipple was completely retracted. There was no skin thickening. The right axilla was benign by palpation. Ultrasound showed a prominent irregular hypoechoic mass larger than the ultrasound screen. Ultrasound of the right axilla showed 2 adjacent small lymph nodes with diffusely thickened cortices, the largest measuring 1.1 cm.  On 03/04/2013 the patient underwent biopsy of the right breast mass and low right axillary lymph node, with the pathology ([id number]) showing, in the breast, and invasive ductal carcinoma, grade 3, 100% estrogen receptor positive, and her percent progesterone receptor positive, both with strong staining intensity, and a proliferation marker of 20%. There was no HER-2 amplification with a ratio of 1.46 by CISH and an average copy number of 2.85.  On 03/11/2013 the patient underwent bilateral breast MRI. This showed an irregular enhancing mass in the central to upper right breast with nipple retraction measuring in total 6.8 cm. There were several adjacent  enhancing nodules as well. There was no involvement of the pectoralis muscle. There were no definite morphologically abnormal right axillary lymph nodes there was no definite internal mammary or lymphadenopathy. Left breast was unremarkable.  The patient's subsequent history is as detailed below   INTERVAL HISTORY: Denise Becker returns for follow up of her estrogen receptor positive breast cancer. She continues on tamoxifen, with good tolerance. Her flashes are mild and occasional. She has no significant vaginal discharge problems. She obtains the drug at a good price.  REVIEW OF SYSTEMS: Karizma has bilateral ankle swelling, which comes and goes. This is not a new problem. She does have a gym membership at [employer] but has not been going. She enjoyed Thanksgiving's with friends. She has mild reflux problems. Aside from these issues a detailed review of systems today was stable  PAST MEDICAL HISTORY: Past Medical History:  Diagnosis Date  . Anxiety    anxiousabout impending surgery- loss of breast  . Arthritis    "joints hurt q now and then" (10/25/2013)  . Breast cancer of upper-outer quadrant of right female breast ([facility]) 03/10/2013  . Depression    history of depression after son died in the 2000's  . GERD (gastroesophageal reflux disease)    "just chemo related"  . H/O hiatal hernia   . Hypertension   . Radiation 12/08/13-01/17/14   Right chest wall 50.4 Gy  . Spontaneous dislocation of shoulder [phone]   "right; a few times over the years"    PAST SURGICAL HISTORY: Past Surgical History:  Procedure Laterality Date  . ABDOMINAL HYSTERECTOMY  1990's  . BREAST BIOPSY Right 02/2013  . CESAREAN SECTION  1980  .  MASTECTOMY COMPLETE / SIMPLE W/ SENTINEL NODE BIOPSY Right 10/25/2013  . MASTECTOMY W/ SENTINEL NODE BIOPSY Right 10/25/2013   Procedure: RIGHT TOTAL MASTECTOMY WITH SENTINEL LYMPH NODE BIOPSY;  Surgeon: Adin Hector, MD;  Location: [facility];  Service: General;   Laterality: Right;  . MULTIPLE TOOTH EXTRACTIONS Bilateral 2011-2014 X 6  . PORT A CATH REVISION Right 04/29/2013   Procedure: PORT A CATH REVISION;  Surgeon: Adin Hector, MD;  Location: [facility];  Service: General;  Laterality: Right;  . PORT-A-CATH REMOVAL  10/25/2013  . PORT-A-CATH REMOVAL N/A 10/25/2013   Procedure: REMOVAL PORT-A-CATH;  Surgeon: Adin Hector, MD;  Location: [facility];  Service: General;  Laterality: N/A;  . PORTACATH PLACEMENT Right 04/15/2013   Procedure: INSERTION PORT-A-CATH;  Surgeon: Adin Hector, MD;  Location: [facility];  Service: General;  Laterality: Right;  . TONSILLECTOMY    . WISDOM TOOTH EXTRACTION      FAMILY HISTORY Family History  Problem Relation Age of Onset  . Cancer Mother   . Hypertension Mother   . Bowel Disease Mother   . Lung disease Father   . Heart disease Maternal Grandmother   . Heart disease Maternal Grandfather   the patient's mother,Denise Becker, had a lung mass but apparently died from unrelated causes (she was followed by Dr. Earlie Server here). She was 66 years old. The patient has little information about her father. The patient had 2 brothers, no sisters. There is no history of breast or ovarian cancer in the family to her knowledge.   GYNECOLOGIC HISTORY:   (Reviewed 08/23/2013) Menarche age 27, first live birth age 39, the patient is GX P1. She went through the change of life approximately 1999. She did not take hormone replacement.  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 She was on disability throughout her treatment, later retired. She is single. The patient's son Denise Becker died at the age of 24 with acute leukemia. The patient has no grandchildren.    ADVANCED DIRECTIVES: Not in place, but the patient was given the appropriate documents to complete and notarize on her 03/16/2013 visit. She intends to  name her cousin, Sharrie Rothman, as her healthcare power of attorney. She can be reached at [place]:  (Updated 08/23/2013) Social History  Substance Use Topics  . Smoking status: Former Smoker    Years: 3.00    Types: Cigarettes    Quit date: 04/12/2001  . Smokeless tobacco: Never Used     Comment: "casual smoker; pack would go stale on me"  . Alcohol use Yes     Comment: 10/25/2013 "might have a glass of wine a few times/year"     Colonoscopy: Not on file  PAP: UTD/Dr. Delsa Sale  Bone density:  Never  Lipid panel:  Dec 2014/Dr. Jackson-Moore   Allergies  Allergen Reactions  . Ciprofloxacin     Patient developed severe diarrhea on cipro    Current Outpatient Prescriptions  Medication Sig Dispense Refill  . amLODipine (NORVASC) 10 MG tablet Take 10 mg by mouth daily.     Marland Kitchen anastrozole (ARIMIDEX) 1 MG tablet Take 1 tablet (1 mg total) by mouth daily. 90 tablet 4  . losartan (COZAAR) 100 MG tablet Take 100 mg by mouth daily.     . Multiple Vitamin (MULTIVITAMIN) capsule Take 1 capsule by mouth daily.    . naproxen sodium (ANAPROX) 220 MG  tablet Take 220 mg by mouth 2 (two) times daily as needed.     No current facility-administered medications for this visit.    Objective: Middle-aged [place] American woman Who appears stated age 65:   02/26/16 1219  BP: (!) 152/65  Pulse: 82  Resp: 18  Temp: 97.7 F (36.5 C)   Filed Weights   02/26/16 1219  Weight: 227 lb 12.8 oz (103.3 kg)   Body mass index is 37.91 kg/m.  ECOG: 1  Sclerae unicteric, pupils round and equal Oropharynx clear and moist-- no thrush or other lesions No cervical or supraclavicular adenopathy Lungs no rales or rhonchi Heart regular rate and rhythm Abd soft, obese, nontender, positive bowel sounds MSK no focal spinal tenderness, bilateral grade 1 ankle edema Neuro: nonfocal, well oriented, appropriate affect Breasts: The right breast is status post mastectomy and  radiation. There is still hyperpigmentation. There is no evidence of local recurrence. The right axilla is benign. Left breast is unremarkable.  LAB RESULTS:   Lab Results  Component Value Date   WBC 9.0 02/19/2016   NEUTROABS 5.4 02/19/2016   HGB 13.3 02/19/2016   HCT 39.9 02/19/2016   MCV 84.7 02/19/2016   PLT 261 02/19/2016      Chemistry      Component Value Date/Time   NA 141 02/19/2016 1048   K 3.5 02/19/2016 1048   CL 104 10/17/2013 1047   CO2 26 02/19/2016 1048   BUN 13.5 02/19/2016 1048   CREATININE 1.0 02/19/2016 1048      Component Value Date/Time   CALCIUM 9.4 02/19/2016 1048   ALKPHOS 68 02/19/2016 1048   AST 14 02/19/2016 1048   ALT 17 02/19/2016 1048   BILITOT 0.39 02/19/2016 1048       STUDIES: CLINICAL DATA:  Screening. History of right breast cancer status post mastectomy.  EXAM: 2D DIGITAL SCREENING UNILATERAL LEFT MAMMOGRAM WITH CAD AND ADJUNCT TOMO  COMPARISON:  Previous exam(s).  ACR Breast Density Category c: The breast tissue is heterogeneously dense, which may obscure small masses.  FINDINGS: There are no findings suspicious for malignancy within the left breast. Images were processed with CAD.  IMPRESSION: No mammographic evidence of malignancy. A result letter of this screening mammogram will be mailed directly to the patient.  RECOMMENDATION: Screening mammogram in one year. (Code:SM-B-01Y)  BI-RADS CATEGORY  1: Negative.   Electronically Signed   By: Franki Cabot M.D.   On: 07/05/2015 13:38  ASSESSMENT: 65 y.o. [place] woman   (1)  status post right breast biopsy 03/04/2013 for a clinical T3 N1, stage IIIA invasive ductal carcinoma, grade 3, estrogen receptor 100% positive, progesterone receptor 100% positive, with an MIB-1 of 20% and no HER-2 amplification.  (2) right axillary lymph node biopsy 03/04/2013 was negative  (3) neoadjuvant chemotherapy with docetaxel, doxorubicin and cyclophosphamide x6  cycles completed 08/23/2013  (4) status post right total mastectomy and right axillary sentinel node biopsy 10/25/2013 for a residual ypT3 ypN0 invasive lobular carcinoma, again estrogen and progesterone receptor positive and HER-2 nonamplified; with negative margins  (5) adjuvant radiation 12/08/2013-01/17/2014:  Right chest wall/50.4 Pearline Cables @ 1.8 Pearline Cables per fraction x 28 fractions  (6) started tamoxifen 02/28/2014--to switch to anastrozole  05/01/2016  (7) large ventral hernia  PLAN: Ivana is  now little more than 2 years out from definitive surgery for her breast cancer with no evidence of disease recurrence. This is very favorable.  She has completed 2 years of tamoxifen. She has the opportunity of switching to  an aromatase inhibitor and today we discussed that at length.  She understands specifically that 5 years of tamoxifen is inferior to 10 years of tamoxifen or 2 years of tamoxifen followed by 3 years of an aromatase inhibitor.  We discussed the possible toxicities, side effects and complications of anastrozole. She is willing to give it a try but just had her tamoxifen refill.  Accordingly she will continue on tamoxifen for now. She will start anastrozole 05/01/2016. She will have her next mammogram in April 2018 and without she will also have a baseline bone density.  She will return to see me early May 2018 and in the lab work before that visit we will also obtain a vitamin D level  [facility]. As far as her ankle swelling is concerned I suggested as a first step is she cut down on salt in her diet  She knows to call for any problems that may develop before her next visit   Chauncey Cruel, MD  02/26/2016 12:51 PM

## 2016-06-11 ENCOUNTER — Other Ambulatory Visit: Payer: Self-pay | Admitting: Oncology

## 2016-06-11 DIAGNOSIS — Z1231 Encounter for screening mammogram for malignant neoplasm of breast: Secondary | ICD-10-CM

## 2016-07-07 ENCOUNTER — Ambulatory Visit
Admission: RE | Admit: 2016-07-07 | Discharge: 2016-07-07 | Disposition: A | Discharge status: Home / Self Care | Payer: Medicare HMO | Source: Ambulatory Visit | Attending: Oncology | Admitting: Oncology

## 2016-07-07 DIAGNOSIS — Z1231 Encounter for screening mammogram for malignant neoplasm of breast: Secondary | ICD-10-CM

## 2016-07-29 ENCOUNTER — Other Ambulatory Visit (HOSPITAL_BASED_OUTPATIENT_CLINIC_OR_DEPARTMENT_OTHER): Payer: Medicare HMO

## 2016-07-29 DIAGNOSIS — C50811 Malignant neoplasm of overlapping sites of right female breast: Secondary | ICD-10-CM

## 2016-07-29 DIAGNOSIS — Z170 Estrogen receptor positive status [ER+]: Principal | ICD-10-CM

## 2016-07-29 DIAGNOSIS — C50111 Malignant neoplasm of central portion of right female breast: Secondary | ICD-10-CM

## 2016-07-29 DIAGNOSIS — C50411 Malignant neoplasm of upper-outer quadrant of right female breast: Secondary | ICD-10-CM

## 2016-07-29 LAB — CBC WITH DIFFERENTIAL/PLATELET
BASO%: 0.6 % (ref 0.0–2.0)
BASOS ABS: 0 10*3/uL (ref 0.0–0.1)
EOS%: 1.7 % (ref 0.0–7.0)
Eosinophils Absolute: 0.1 10*3/uL (ref 0.0–0.5)
HCT: 44.7 % (ref 34.8–46.6)
HGB: 14.6 g/dL (ref 11.6–15.9)
LYMPH%: 33.5 % (ref 14.0–49.7)
MCH: 27.7 pg (ref 25.1–34.0)
MCHC: 32.7 g/dL (ref 31.5–36.0)
MCV: 84.8 fL (ref 79.5–101.0)
MONO#: 0.7 10*3/uL (ref 0.1–0.9)
MONO%: 9.9 % (ref 0.0–14.0)
NEUT#: 3.8 10*3/uL (ref 1.5–6.5)
NEUT%: 54.3 % (ref 38.4–76.8)
Platelets: 269 10*3/uL (ref 145–400)
RBC: 5.27 10*6/uL (ref 3.70–5.45)
RDW: 14.4 % (ref 11.2–14.5)
WBC: 7 10*3/uL (ref 3.9–10.3)
lymph#: 2.3 10*3/uL (ref 0.9–3.3)

## 2016-07-29 LAB — COMPREHENSIVE METABOLIC PANEL
ALT: 17 U/L (ref 0–55)
AST: 13 U/L (ref 5–34)
Albumin: 3.7 g/dL (ref 3.5–5.0)
Alkaline Phosphatase: 65 U/L (ref 40–150)
Anion Gap: 8 mEq/L (ref 3–11)
BUN: 13.1 mg/dL (ref 7.0–26.0)
CALCIUM: 9.8 mg/dL (ref 8.4–10.4)
CHLORIDE: 107 meq/L (ref 98–109)
CO2: 27 mEq/L (ref 22–29)
Creatinine: 1 mg/dL (ref 0.6–1.1)
EGFR: 71 mL/min/{1.73_m2} — ABNORMAL LOW (ref >90)
Glucose: 91 mg/dl (ref 70–140)
POTASSIUM: 3.5 meq/L (ref 3.5–5.1)
Sodium: 141 mEq/L (ref 136–145)
Total Bilirubin: 0.37 mg/dL (ref 0.20–1.20)
Total Protein: 8.4 g/dL — ABNORMAL HIGH (ref 6.4–8.3)

## 2016-07-30 LAB — VITAMIN D 25 HYDROXY (VIT D DEFICIENCY, FRACTURES): Vitamin D, 25-Hydroxy: 27.1 ng/mL — ABNORMAL LOW (ref 30.0–100.0)

## 2016-08-05 ENCOUNTER — Ambulatory Visit (HOSPITAL_BASED_OUTPATIENT_CLINIC_OR_DEPARTMENT_OTHER): Payer: Medicare HMO | Admitting: Oncology

## 2016-08-05 VITALS — BP 151/71 | HR 72 | Temp 97.9°F | Ht 65.0 in | Wt 225.8 lb

## 2016-08-05 DIAGNOSIS — Z170 Estrogen receptor positive status [ER+]: Secondary | ICD-10-CM | POA: Diagnosis not present

## 2016-08-05 DIAGNOSIS — C50811 Malignant neoplasm of overlapping sites of right female breast: Secondary | ICD-10-CM

## 2016-08-05 DIAGNOSIS — Z79810 Long term (current) use of selective estrogen receptor modulators (SERMs): Secondary | ICD-10-CM

## 2016-08-05 DIAGNOSIS — C50411 Malignant neoplasm of upper-outer quadrant of right female breast: Secondary | ICD-10-CM

## 2016-08-05 MED ORDER — TAMOXIFEN CITRATE 20 MG PO TABS: 20.0000 mg | ORAL_TABLET | Freq: Every day | ORAL | 12 refills | Status: AC

## 2016-08-05 NOTE — Progress Notes (Signed)
ID: [name] OB: 06/29/1950  MR#: [id number]  WYO#:[id number]  PCP: Elliot Gurney GYN:  Lahoma Crocker, MD SU: Fanny Skates, MD OTHER MD: Thea Silversmith, MD  CHIEF COMPLAINT: Right breast cancer  CURRENT TREATMENT: Tamoxifen  BREAST CANCER HISTORY: From the initial intake note 03/16/2013:  Denise Becker palpated a change in her right breast sometime in August or September 2014. She was aware that this might be significant, but waited on meds until she saw her gynecologist for a routine visit. Dr. Delsa Sale immediately he set her up for right mammography and ultrasonography performed 03/03/2013. Mammography found an area of density in the upper outer quadrant of the right breast measuring 5 cm in the with right nipple retraction. There was no evidence of skin thickening. On exam there was a firm palpable mass in the superior right breast extending from 1:00 to 9:00. The right nipple was completely retracted. There was no skin thickening. The right axilla was benign by palpation. Ultrasound showed a prominent irregular hypoechoic mass larger than the ultrasound screen. Ultrasound of the right axilla showed 2 adjacent small lymph nodes with diffusely thickened cortices, the largest measuring 1.1 cm.  On 03/04/2013 the patient underwent biopsy of the right breast mass and low right axillary lymph node, with the pathology ([id number]) showing, in the breast, and invasive ductal carcinoma, grade 3, 100% estrogen receptor positive, and her percent progesterone receptor positive, both with strong staining intensity, and a proliferation marker of 20%. There was no HER-2 amplification with a ratio of 1.46 by CISH and an average copy number of 2.85.  On 03/11/2013 the patient underwent bilateral breast MRI. This showed an irregular enhancing mass in the central to upper right breast with nipple retraction measuring in total 6.8 cm. There were several adjacent enhancing nodules as well. There  was no involvement of the pectoralis muscle. There were no definite morphologically abnormal right axillary lymph nodes there was no definite internal mammary or lymphadenopathy. Left breast was unremarkable.  The patient's subsequent history is as detailed below   INTERVAL HISTORY: Denise Becker returns today for follow-up of her estrogen receptor positive breast cancer. At the last visit we discussed switching over to anastrozole so she could give the slide extra percentage decrease in recurrence risk. However after reading the side effects she decided against it. She still had "a lot of" tamoxifen tablets left so she never did interrupted tamoxifen.  She is tolerating that well, without significant problems with hot flashes or vaginal wetness. She obtains it at a good price.   REVIEW OF SYSTEMS: Denise Becker tells me she is going to be having colonoscopy and EGD soon. She will make sure that we get a copy of those results. She is not exercising regularly although she is a member of a gym. Overall she is doing "fine" and a detailed review of systems was noncontributory  PAST MEDICAL HISTORY: Past Medical History:  Diagnosis Date  . Anxiety    anxiousabout impending surgery- loss of breast  . Arthritis    "joints hurt q now and then" (10/25/2013)  . Breast cancer of upper-outer quadrant of right female breast ([place]) 03/10/2013  . Depression    history of depression after son died in the 2000's  . GERD (gastroesophageal reflux disease)    "just chemo related"  . H/O hiatal hernia   . Hypertension   . Radiation 12/08/13-01/17/14   Right chest wall 50.4 Gy  . Spontaneous dislocation of shoulder [phone]   "right; a few times  over the years"    PAST SURGICAL HISTORY: Past Surgical History:  Procedure Laterality Date  . ABDOMINAL HYSTERECTOMY  1990's  . BREAST BIOPSY Right 02/2013  . CESAREAN SECTION  1980  . MASTECTOMY COMPLETE / SIMPLE W/ SENTINEL NODE BIOPSY Right 10/25/2013  . MASTECTOMY  W/ SENTINEL NODE BIOPSY Right 10/25/2013   Procedure: RIGHT TOTAL MASTECTOMY WITH SENTINEL LYMPH NODE BIOPSY;  Surgeon: Adin Hector, MD;  Location: [facility];  Service: General;  Laterality: Right;  . MULTIPLE TOOTH EXTRACTIONS Bilateral 2011-2014 X 6  . PORT A CATH REVISION Right 04/29/2013   Procedure: PORT A CATH REVISION;  Surgeon: Adin Hector, MD;  Location: [facility];  Service: General;  Laterality: Right;  . PORT-A-CATH REMOVAL  10/25/2013  . PORT-A-CATH REMOVAL N/A 10/25/2013   Procedure: REMOVAL PORT-A-CATH;  Surgeon: Adin Hector, MD;  Location: [facility];  Service: General;  Laterality: N/A;  . PORTACATH PLACEMENT Right 04/15/2013   Procedure: INSERTION PORT-A-CATH;  Surgeon: Adin Hector, MD;  Location: [facility];  Service: General;  Laterality: Right;  . TONSILLECTOMY    . WISDOM TOOTH EXTRACTION      FAMILY HISTORY Family History  Problem Relation Age of Onset  . Cancer Mother   . Hypertension Mother   . Bowel Disease Mother   . Lung disease Father   . Heart disease Maternal Grandmother   . Heart disease Maternal Grandfather   . Breast cancer Neg Hx   the patient's mother,Denise Becker, had a lung mass but apparently died from unrelated causes (she was followed by Dr. Earlie Server here). She was 67 years old. The patient has little information about her father. The patient had 2 brothers, no sisters. There is no history of breast or ovarian cancer in the family to her knowledge.   GYNECOLOGIC HISTORY:   (Reviewed 08/23/2013) Menarche age 14, first live birth age 8, the patient is GX P1. She went through the change of life approximately 1999. She did not take hormone replacement.  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 She was on disability throughout her treatment, later retired. She is single. The patient's son Denise Becker died at the age of 39 with  acute leukemia. The patient has no grandchildren.    ADVANCED DIRECTIVES: Not in place, but the patient was given the appropriate documents to complete and notarize on her 03/16/2013 visit. She intends to name her cousin, Denise Becker, as her healthcare power of attorney. She can be reached at [place]:  (Updated 08/23/2013) Social History  Substance Use Topics  . Smoking status: Former Smoker    Years: 3.00    Types: Cigarettes    Quit date: 04/12/2001  . Smokeless tobacco: Never Used     Comment: "casual smoker; pack would go stale on me"  . Alcohol use Yes     Comment: 10/25/2013 "might have a glass of wine a few times/year"     Colonoscopy: Not on file  PAP: UTD/Dr. Delsa Sale  Bone density:  Never  Lipid panel:  Dec 2014/Dr. Jackson-Moore   Allergies  Allergen Reactions  . Ciprofloxacin     Patient developed severe diarrhea on cipro    Current Outpatient Prescriptions  Medication Sig Dispense Refill  . amLODipine (NORVASC) 10 MG tablet Take 10 mg by mouth daily.     Marland Kitchen losartan (COZAAR) 100 MG tablet Take 100 mg by mouth daily.     Marland Kitchen  Multiple Vitamin (MULTIVITAMIN) capsule Take 1 capsule by mouth daily.    . naproxen sodium (ANAPROX) 220 MG tablet Take 220 mg by mouth 2 (two) times daily as needed.    . tamoxifen (NOLVADEX) 20 MG tablet Take 1 tablet (20 mg total) by mouth daily. 90 tablet 12   No current facility-administered medications for this visit.    Objective: Middle-aged [place] American woman In no acute distress Vitals:   08/05/16 1216  BP: (!) 151/71  Pulse: 72  Temp: 97.9 F (36.6 C)   Filed Weights   08/05/16 1216  Weight: 225 lb 12.8 oz (102.4 kg)   Body mass index is 37.58 kg/m.  ECOG: 1  Sclerae unicteric, EOMs intact Oropharynx clear and moist No cervical or supraclavicular adenopathy Lungs no rales or rhonchi Heart regular rate and rhythm Abd soft, nontender, positive bowel sounds MSK no focal spinal  tenderness, no upper extremity lymphedema Neuro: nonfocal, well oriented, appropriate affect Breasts: She has undergone right mastectomy and chest wall radiation. There is no evidence of local recurrence. The hyperpigmentation is slowly resolving. The left breast is unremarkable. Both axillae are benign.  LAB RESULTS:   Lab Results  Component Value Date   WBC 7.0 07/29/2016   NEUTROABS 3.8 07/29/2016   HGB 14.6 07/29/2016   HCT 44.7 07/29/2016   MCV 84.8 07/29/2016   PLT 269 07/29/2016      Chemistry      Component Value Date/Time   NA 141 07/29/2016 1120   K 3.5 07/29/2016 1120   CL 104 10/17/2013 1047   CO2 27 07/29/2016 1120   BUN 13.1 07/29/2016 1120   CREATININE 1.0 07/29/2016 1120      Component Value Date/Time   CALCIUM 9.8 07/29/2016 1120   ALKPHOS 65 07/29/2016 1120   AST 13 07/29/2016 1120   ALT 17 07/29/2016 1120   BILITOT 0.37 07/29/2016 1120       STUDIES: Mammography at the [facility] 07/07/2016 found the breast density to be category B. There was no evidence of malignancy.  ASSESSMENT: 66 y.o. [place] woman   (1)  status post right breast biopsy 03/04/2013 for a clinical T3 N1, stage IIIA invasive ductal carcinoma, grade 3, estrogen receptor 100% positive, progesterone receptor 100% positive, with an MIB-1 of 20% and no HER-2 amplification.  (2) right axillary lymph node biopsy 03/04/2013 was negative  (3) neoadjuvant chemotherapy with docetaxel, doxorubicin and cyclophosphamide x6 cycles completed 08/23/2013  (4) status post right total mastectomy and right axillary sentinel node biopsy 10/25/2013 for a residual ypT3 ypN0 invasive lobular carcinoma, again estrogen and progesterone receptor positive and HER-2 nonamplified; with negative margins  (5) adjuvant radiation 12/08/2013-01/17/2014:  Right chest wall/50.4 Pearline Cables @ 1.8 Gray per fraction x 28 fractions  (6) started tamoxifen 02/28/2014--decided against switching to anastrozole  (7)  large ventral hernia  PLAN: Tyneka is now just about 3 years out from definitive surgery for her breast cancer with no evidence of disease recurrence. This is very favorable.  We reviewed the results of her mammogram and she understands it is favorable to have a low breast density. It makes a mammogram more sensitive.  She never did switch to anastrozole and continues on tamoxifen. She tolerates that remarkably well and the plan will be to continue that for a minimum of 5 years.  I think she would benefit from physical therapy because she has restricted range of motion in her right upper extremity. I'm placing that referral in for her.  She tells  me she is going to have a colonoscopy and upper endoscopy. She will make sure that I get a copy of those results.  She should start vitamin D supplementation and I suggested 1000 units daily. We will repeat a vitamin D level at some point in the future  She will return to see me in one year. She knows to call for any problems that may develop before her next visit. Chauncey Cruel, MD  08/05/2016 1:11 PM

## 2016-08-12 ENCOUNTER — Encounter: Payer: Self-pay | Admitting: Physical Therapy

## 2016-08-12 ENCOUNTER — Ambulatory Visit: Payer: Medicare HMO | Attending: Oncology | Admitting: Physical Therapy

## 2016-08-12 DIAGNOSIS — M25511 Pain in right shoulder: Secondary | ICD-10-CM | POA: Diagnosis present

## 2016-08-12 DIAGNOSIS — M25611 Stiffness of right shoulder, not elsewhere classified: Secondary | ICD-10-CM | POA: Insufficient documentation

## 2016-08-12 DIAGNOSIS — M6281 Muscle weakness (generalized): Secondary | ICD-10-CM | POA: Insufficient documentation

## 2016-08-12 DIAGNOSIS — G8929 Other chronic pain: Secondary | ICD-10-CM | POA: Insufficient documentation

## 2016-08-12 NOTE — Therapy (Signed)
[place], [place], [address] Phone: [phone]   Fax:  [phone]  Physical Therapy Evaluation  Patient Details  Name: Denise Becker Date of Birth: Aug 09, 1950 Referring Provider: [name]  Encounter Date: 08/12/2016      PT End of Session - 08/12/16 1606    Visit Number 1   Number of Visits 9   Date for PT Re-Evaluation 09/09/16   PT Start Time 1430   PT Stop Time 1515   PT Time Calculation (min) 45 min   Activity Tolerance Patient tolerated treatment well   Behavior During Therapy [facility] for tasks assessed/performed      Past Medical History:  Diagnosis Date  . Anxiety    anxiousabout impending surgery- loss of breast  . Arthritis    "joints hurt q now and then" (10/25/2013)  . Breast cancer of upper-outer quadrant of right female breast ([place]) 03/10/2013  . Depression    history of depression after son died in the 2000's  . GERD (gastroesophageal reflux disease)    "just chemo related"  . H/O hiatal hernia   . Hypertension   . Radiation 12/08/13-01/17/14   Right chest wall 50.4 Gy  . Spontaneous dislocation of shoulder [phone]   "right; a few times over the years"    Past Surgical History:  Procedure Laterality Date  . ABDOMINAL HYSTERECTOMY  1990's  . BREAST BIOPSY Right 02/2013  . CESAREAN SECTION  1980  . MASTECTOMY COMPLETE / SIMPLE W/ SENTINEL NODE BIOPSY Right 10/25/2013  . MASTECTOMY W/ SENTINEL NODE BIOPSY Right 10/25/2013   Procedure: RIGHT TOTAL MASTECTOMY WITH SENTINEL LYMPH NODE BIOPSY;  Surgeon: Adin Hector, MD;  Location: [facility];  Service: General;  Laterality: Right;  . MULTIPLE TOOTH EXTRACTIONS Bilateral 2011-2014 X 6  . PORT A CATH REVISION Right 04/29/2013   Procedure: PORT A CATH REVISION;  Surgeon: Adin Hector, MD;  Location: [facility];  Service: General;  Laterality: Right;  . PORT-A-CATH REMOVAL  10/25/2013  .  PORT-A-CATH REMOVAL N/A 10/25/2013   Procedure: REMOVAL PORT-A-CATH;  Surgeon: Adin Hector, MD;  Location: [facility];  Service: General;  Laterality: N/A;  . PORTACATH PLACEMENT Right 04/15/2013   Procedure: INSERTION PORT-A-CATH;  Surgeon: Adin Hector, MD;  Location: [facility];  Service: General;  Laterality: Right;  . TONSILLECTOMY    . WISDOM TOOTH EXTRACTION      There were no vitals filed for this visit.       Subjective Assessment - 08/12/16 1432    Subjective My right arm is just stiff. I can't lift it all the way up. I can't bend it all the way back. I do not have use of it like I do with the left arm. It tightens up on me.    Pertinent History status post right total mastectomy and right axillary sentinel node biopsy 10/25/2013 for a residual ypT3 ypN0 invasive lobular carcinoma, again estrogen and progesterone receptor positive and HER-2 nonamplified; with negative margins, pt has completed radiation and chemotherapy,    Patient Stated Goals to be able to move the arm through full ROM   Currently in Pain? No/denies            [facility] PT Assessment - 08/12/16 0001      Assessment   Medical Diagnosis right breast cancer   Referring Provider [name]   Onset Date/Surgical Date 10/25/13   Hand Dominance Right   Prior  Therapy had physical therapy right after surgery     Precautions   Precautions Other (comment)  at risk for lymphedema     Restrictions   Weight Bearing Restrictions No     Balance Screen   Has the patient fallen in the past 6 months No   Has the patient had a decrease in activity level because of a fear of falling?  No   Is the patient reluctant to leave their home because of a fear of falling?  No     Home Environment   Living Environment Private residence   Living Arrangements Alone   Available Help at Discharge Friend(s);Family   Type of Home Apartment   Home Access Level entry   Home Layout One level     Prior Function    Level of Independence Independent   Vocation Retired   Leisure pt reports she is walking and she was going to the gym 3x/wk     Cognition   Overall Cognitive Status Within Functional Limits for tasks assessed     Observation/Other Assessments   Observations poor glenohumeral rhythm - pts shoulder elevates when attempting flexion and abduction on R side, pt reports her ROM has been limited a couple of months   Skin Integrity fibrosis in right axilla and along mastectomy scar     ROM / Strength   AROM / PROM / Strength AROM     AROM   AROM Assessment Site Shoulder   Right Shoulder Flexion 104 Degrees   Right Shoulder ABduction 80 Degrees   Right Shoulder Internal Rotation 45 Degrees   Right Shoulder External Rotation 51 Degrees   Left Shoulder Flexion 150 Degrees   Left Shoulder ABduction 175 Degrees   Left Shoulder Internal Rotation 72 Degrees   Left Shoulder External Rotation 56 Degrees     Flexibility   Soft Tissue Assessment /Muscle Length --  tight pec muscle           LYMPHEDEMA/ONCOLOGY QUESTIONNAIRE - 08/12/16 1445      Type   Cancer Type right breast cancer     Surgeries   Mastectomy Date 10/25/13   Sentinel Lymph Node Biopsy Date 10/25/13   Number Lymph Nodes Removed --  pt unsure     Treatment   Active Chemotherapy Treatment No   Past Chemotherapy Treatment Yes   Date 08/23/13   Active Radiation Treatment No   Past Radiation Treatment Yes   Date 01/17/14   Body Site right chest   Current Hormone Treatment Yes   Drug Name Tamoxifen   Past Hormone Therapy No     What other symptoms do you have   Are you Having Heaviness or Tightness Yes  right axilla   Are you having Pain Yes  when move arm in certain directions   Are you having pitting edema No   Is it Hard or Difficult finding clothes that fit No   Do you have infections No   Is there Decreased scar mobility Yes     Lymphedema Assessments   Lymphedema Assessments Upper extremities      Right Upper Extremity Lymphedema   15 cm Proximal to Olecranon Process 41 cm   Olecranon Process 31 cm   15 cm Proximal to Ulnar Styloid Process 30 cm   Just Proximal to Ulnar Styloid Process 19.5 cm   Across Hand at [employer] 20 cm   At [place] of 2nd Digit 6.6 cm     Left Upper Extremity  Lymphedema   15 cm Proximal to Olecranon Process 39.5 cm   Olecranon Process 31.2 cm   15 cm Proximal to Ulnar Styloid Process 29.9 cm   Just Proximal to Ulnar Styloid Process 18.9 cm   Across Hand at [employer] 19.6 cm   At [place] of 2nd Digit 6.5 cm           Quick Dash - 08/12/16 0001    Open a tight or new jar Mild difficulty   Do heavy household chores (wash walls, wash floors) No difficulty   Carry a shopping bag or briefcase Mild difficulty   Wash your back Moderate difficulty   Use a knife to cut food No difficulty   Recreational activities in which you take some force or impact through your arm, shoulder, or hand (golf, hammering, tennis) No difficulty   During the past week, to what extent has your arm, shoulder or hand problem interfered with your normal social activities with family, friends, neighbors, or groups? Slightly   During the past week, to what extent has your arm, shoulder or hand problem limited your work or other regular daily activities Slightly   Arm, shoulder, or hand pain. Mild   Tingling (pins and needles) in your arm, shoulder, or hand Mild   Difficulty Sleeping No difficulty   DASH Score 18.18 %             [facility] Adult PT Treatment/Exercise - 08/12/16 0001      Manual Therapy   Manual Therapy Joint mobilization;Passive ROM   Joint Mobilization in left sidelying to right scapula in direction of protraction and depression, gentle rocking, pt has decreased scapular retraction   Passive ROM to R shoulder in direction of flexion, abduction and ER, blocked scapula from elevating during movements                        Long Term Clinic  Goals - 08/12/16 1713      CC Long Term Goal  #1   Title Pt to demonstrate 140 degrees of right shoulder flexion to allow her to reach items overhead   Baseline 104   Time 4   Period Weeks   Status New     CC Long Term Goal  #2   Title Pt to demonstrate 150 degrees of right shoulder abduction to allow her to reach items out to sides   Baseline 80   Time 4   Period Weeks   Status New     CC Long Term Goal  #3   Title Pt to demonstrate 60 degrees of right shoulder IR to allow her to return to prior level of function   Baseline 45   Time 4   Period Weeks   Status New     CC Long Term Goal  #4   Title Pt to be independent in a home exercise program for continued strengthening and stretching   Time 4   Period Weeks   Status New     CC Long Term Goal  #5   Title Pt to report at least a 60% improvement in feeling of tightness when using her RUE to allow for improved function   Time 4   Period Weeks   Status New            Plan - 08/12/16 1606    Clinical Impression Statement Pt presents to PT for decreased right shoulder ROM. She underwent a right mastectomy  for treatment of right breast cancer in 2015. She completed chemotherapy and radiation. She reports in the last few months her ROM has gotten worse. She also reports she has a history of dislocating her right shoulder. She demonstrates significant limitations in direction of right abduction and flexion. She has poor gleno humeral rhythm and her right scapula elevates too soon. She has pain in the right side of her neck with right UE movements and stretches. Patient would benefit from skilled PT services to increase right shoulder ROM and strength. She also has signficant fibrosis in right axilla and along mastectomy scar in addition to pec tightness. This evaluation was of low complexity due to lack of comorbidities.    Rehab Potential Good   Clinical Impairments Affecting Rehab Potential hx of dislocating R shoulder multiple  times   PT Frequency 2x / week   PT Duration 4 weeks   PT Treatment/Interventions ADLs/Self Care Home Management;Electrical Stimulation;Iontophoresis 33m/ml Dexamethasone;Therapeutic exercise;Therapeutic activities;Patient/family education;Manual techniques;Scar mobilization;Passive range of motion;Taping   PT Next Visit Plan begin AAROM/AROM/PROM to right shoulder, scapular mobilizations, scar massage, myofascial to R axilla   Consulted and Agree with Plan of Care Patient      Patient will benefit from skilled therapeutic intervention in order to improve the following deficits and impairments:  Increased fascial restricitons, Pain, Decreased range of motion, Decreased strength, Impaired UE functional use, Postural dysfunction, Decreased scar mobility  Visit Diagnosis: Stiffness of right shoulder, not elsewhere classified - Plan: PT plan of care cert/re-cert  Muscle weakness (generalized) - Plan: PT plan of care cert/re-cert  Chronic right shoulder pain - Plan: PT plan of care cert/re-cert      G-Codes - 057/26/201718    Functional Assessment Tool Used (Outpatient Only) quick dash   Functional Limitation Carrying, moving and handling objects   Carrying, Moving and Handling Objects Current Status (([id number] At least 1 percent but less than 20 percent impaired, limited or restricted   Carrying, Moving and Handling Objects Goal Status ((C1638 0 percent impaired, limited or restricted       Problem List Patient Active Problem List   Diagnosis Date Noted  . Venous stasis 12/22/2013  . Bilateral lower leg cellulitis 11/11/2013  . Cancer of [place] of right female breast ([place] 10/25/2013  . Abdominal wall hernia 08/23/2013  . Edema of both legs 07/12/2013  . Essential hypertension, benign 03/16/2013  . Adjustment disorder with mixed anxiety and depressed mood 03/16/2013  . Breast cancer of upper-outer quadrant of right [place] breast [place] 03/10/2013    BAllyson Sabal [facility]5/15/2018, 5:20 PM  [place] [address]Phone: [phone]  Fax:  [phone] Name: Denise ECKMANNMRN: [id number] of Birth: 1Aug 14, 1952 BManus Gunning PT 08/12/16 5:20 PM

## 2016-08-19 ENCOUNTER — Ambulatory Visit: Payer: Medicare HMO | Admitting: Physical Therapy

## 2016-08-19 DIAGNOSIS — M6281 Muscle weakness (generalized): Secondary | ICD-10-CM

## 2016-08-19 DIAGNOSIS — M25611 Stiffness of right shoulder, not elsewhere classified: Secondary | ICD-10-CM

## 2016-08-19 DIAGNOSIS — M25511 Pain in right shoulder: Secondary | ICD-10-CM

## 2016-08-19 DIAGNOSIS — G8929 Other chronic pain: Secondary | ICD-10-CM

## 2016-08-19 NOTE — Patient Instructions (Signed)
Flexion (Isometric)      Cancer Rehab [phone]    Press right fist against wall. Hold __5__ seconds. Repeat _5-10___ times. Do __1-2__ sessions per day.  SHOULDER: Abduction (Isometric)    Use wall as resistance. Press arm against pillow. Hold _5__ seconds. _5-10__ times. Do _1-2__ sessions per day.    External Rotation (Isometric)    Place back of right fist against door frame, with elbow bent. Press fist against door frame. Hold __5__ seconds. Repeat _5-10___ times. Do _1-2___ sessions per day.  Extension (Isometric)    Place rightt bent elbow and back of arm against wall. Press elbow against wall. Hold __5__ seconds. Repeat _5-10___ times. Do _1-2___ sessions per day.

## 2016-08-19 NOTE — Therapy (Signed)
[place], [place], [address] Phone: [phone]   Fax:  [phone]  Physical Therapy Treatment  Patient Details  Name: Denise Becker MRN: [id number] Date of Birth: January 28, 1951 Referring Provider: [name]  Encounter Date: 08/19/2016      PT End of Session - 08/19/16 1735    Visit Number 2   Number of Visits 9   Date for PT Re-Evaluation 09/09/16   PT Start Time 5681   PT Stop Time 1430   PT Time Calculation (min) 45 min   Activity Tolerance Patient tolerated treatment well   Behavior During Therapy [facility] for tasks assessed/performed      Past Medical History:  Diagnosis Date  . Anxiety    anxiousabout impending surgery- loss of breast  . Arthritis    "joints hurt q now and then" (10/25/2013)  . Breast cancer of upper-outer quadrant of right female breast ([place]) 03/10/2013  . Depression    history of depression after son died in the 2000's  . GERD (gastroesophageal reflux disease)    "just chemo related"  . H/O hiatal hernia   . Hypertension   . Radiation 12/08/13-01/17/14   Right chest wall 50.4 Gy  . Spontaneous dislocation of shoulder [phone]   "right; a few times over the years"    Past Surgical History:  Procedure Laterality Date  . ABDOMINAL HYSTERECTOMY  1990's  . BREAST BIOPSY Right 02/2013  . CESAREAN SECTION  1980  . MASTECTOMY COMPLETE / SIMPLE W/ SENTINEL NODE BIOPSY Right 10/25/2013  . MASTECTOMY W/ SENTINEL NODE BIOPSY Right 10/25/2013   Procedure: RIGHT TOTAL MASTECTOMY WITH SENTINEL LYMPH NODE BIOPSY;  Surgeon: Adin Hector, MD;  Location: [place];  Service: General;  Laterality: Right;  . MULTIPLE TOOTH EXTRACTIONS Bilateral 2011-2014 X 6  . PORT A CATH REVISION Right 04/29/2013   Procedure: PORT A CATH REVISION;  Surgeon: Adin Hector, MD;  Location: [place];  Service: General;  Laterality: Right;  . PORT-A-CATH REMOVAL  10/25/2013  .  PORT-A-CATH REMOVAL N/A 10/25/2013   Procedure: REMOVAL PORT-A-CATH;  Surgeon: Adin Hector, MD;  Location: [place];  Service: General;  Laterality: N/A;  . PORTACATH PLACEMENT Right 04/15/2013   Procedure: INSERTION PORT-A-CATH;  Surgeon: Adin Hector, MD;  Location: [place];  Service: General;  Laterality: Right;  . TONSILLECTOMY    . WISDOM TOOTH EXTRACTION      There were no vitals filed for this visit.                       [facility] Adult PT Treatment/Exercise - 08/19/16 0001      Shoulder Exercises: Supine   Other Supine Exercises meeks decompression.  pt needs more work on these, especailly scapular retraction      Shoulder Exercises: Isometric Strengthening   Flexion 5X5"   Extension 5X5"   ABduction 5X5"     Manual Therapy   Manual Therapy Soft tissue mobilization;Myofascial release;Scapular mobilization;Passive ROM   Soft tissue mobilization in left sidelying to left upper back, neck, interscapular area and lateral trunk at lattissumus tendon    Scapular Mobilization gentle inferior glides    Passive ROM to R shoulder in direction of flexion, abduction and ER, blocked scapula from elevating during movements                PT Education - 08/19/16 1429    Education provided Yes   Education  Details shoulder isometrics,flyer for [employer] program    Person(s) Educated Patient   Methods Explanation;Demonstration;Handout   Comprehension Verbalized understanding;Returned demonstration                [facility] Goals - 08/12/16 1713      CC Long Term Goal  #1   Title Pt to demonstrate 140 degrees of right shoulder flexion to allow her to reach items overhead   Baseline 104   Time 4   Period Weeks   Status New     CC Long Term Goal  #2   Title Pt to demonstrate 150 degrees of right shoulder abduction to allow her to reach items out to sides   Baseline 80   Time 4   Period Weeks   Status New     CC Long  Term Goal  #3   Title Pt to demonstrate 60 degrees of right shoulder IR to allow her to return to prior level of function   Baseline 45   Time 4   Period Weeks   Status New     CC Long Term Goal  #4   Title Pt to be independent in a home exercise program for continued strengthening and stretching   Time 4   Period Weeks   Status New     CC Long Term Goal  #5   Title Pt to report at least a 60% improvement in feeling of tightness when using her RUE to allow for improved function   Time 4   Period Weeks   Status New            Plan - 08/19/16 1735    Clinical Impression Statement Pt continues with tightness in right shoulder with multiple trigger points in muscles of upper back and neck.  She felt better with soft tissue work and added isometric strengthening to encouage normal muscle activity around the joint    Rehab Potential Good   Clinical Impairments Affecting Rehab Potential hx of dislocating R shoulder multiple times   PT Frequency 2x / week   PT Duration 4 weeks   PT Treatment/Interventions ADLs/Self Care Home Management;Electrical Stimulation;Iontophoresis 4mg /ml Dexamethasone;Therapeutic exercise;Therapeutic activities;Patient/family education;Manual techniques;Scar mobilization;Passive range of motion;Taping   PT Next Visit Plan begin AAROM/AROM/PROM to right shoulder, scapular mobilizations, scar massage, myofascial to R axilla.  Add stretches on the wall but do not do pulleys due to history of disclocation. continue with strengthening when ready to progress    Consulted and Agree with Plan of Care Patient      Patient will benefit from skilled therapeutic intervention in order to improve the following deficits and impairments:  Increased fascial restricitons, Pain, Decreased range of motion, Decreased strength, Impaired UE functional use, Postural dysfunction, Decreased scar mobility  Visit Diagnosis: Stiffness of right shoulder, not elsewhere classified  Muscle  weakness (generalized)  Chronic right shoulder pain     Problem List Patient Active Problem List   Diagnosis Date Noted  . Venous stasis 12/22/2013  . Bilateral lower leg cellulitis 11/11/2013  . Cancer of central portion of right female breast ([place]) 10/25/2013  . Abdominal wall hernia 08/23/2013  . Edema of both legs 07/12/2013  . Essential hypertension, benign 03/16/2013  . Adjustment disorder with mixed anxiety and depressed mood 03/16/2013  . Breast cancer of upper-outer quadrant of right female breast ([place]) 03/10/2013   Donato Heinz. Owens Shark, PT  [facility] Levo 08/19/2016, 5:39 PM  [place]  [place], [place], [address] Phone: [phone]   Fax:  [phone]  Name: TIESHA MARICH MRN: [id number] Date of Birth: 05-28-1950

## 2016-08-21 ENCOUNTER — Encounter: Payer: Medicare HMO | Admitting: Physical Therapy

## 2016-08-26 ENCOUNTER — Ambulatory Visit: Payer: Medicare HMO | Admitting: Physical Therapy

## 2016-08-26 ENCOUNTER — Encounter: Payer: Self-pay | Admitting: Physical Therapy

## 2016-08-26 DIAGNOSIS — M25611 Stiffness of right shoulder, not elsewhere classified: Secondary | ICD-10-CM

## 2016-08-26 DIAGNOSIS — M25511 Pain in right shoulder: Secondary | ICD-10-CM

## 2016-08-26 DIAGNOSIS — M6281 Muscle weakness (generalized): Secondary | ICD-10-CM

## 2016-08-26 DIAGNOSIS — G8929 Other chronic pain: Secondary | ICD-10-CM

## 2016-08-26 NOTE — Therapy (Signed)
[place], [place], [address] Phone: [phone]   Fax:  [phone]  Physical Therapy Treatment  Patient Details  Name: Denise Becker MRN: [id number] Date of Birth: April 01, 1950 Referring Provider: [name]  Encounter Date: 08/26/2016      PT End of Session - 08/26/16 1717    Visit Number 3   Number of Visits 9   Date for PT Re-Evaluation 09/09/16   PT Start Time 3149   PT Stop Time 1519   PT Time Calculation (min) 47 min   Activity Tolerance Patient tolerated treatment well   Behavior During Therapy [facility] for tasks assessed/performed      Past Medical History:  Diagnosis Date  . Anxiety    anxiousabout impending surgery- loss of breast  . Arthritis    "joints hurt q now and then" (10/25/2013)  . Breast cancer of upper-outer quadrant of right female breast ([place]) 03/10/2013  . Depression    history of depression after son died in the 2000's  . GERD (gastroesophageal reflux disease)    "just chemo related"  . H/O hiatal hernia   . Hypertension   . Radiation 12/08/13-01/17/14   Right chest wall 50.4 Gy  . Spontaneous dislocation of shoulder [phone]   "right; a few times over the years"    Past Surgical History:  Procedure Laterality Date  . ABDOMINAL HYSTERECTOMY  1990's  . BREAST BIOPSY Right 02/2013  . CESAREAN SECTION  1980  . MASTECTOMY COMPLETE / SIMPLE W/ SENTINEL NODE BIOPSY Right 10/25/2013  . MASTECTOMY W/ SENTINEL NODE BIOPSY Right 10/25/2013   Procedure: RIGHT TOTAL MASTECTOMY WITH SENTINEL LYMPH NODE BIOPSY;  Surgeon: Adin Hector, MD;  Location: [place];  Service: General;  Laterality: Right;  . MULTIPLE TOOTH EXTRACTIONS Bilateral 2011-2014 X 6  . PORT A CATH REVISION Right 04/29/2013   Procedure: PORT A CATH REVISION;  Surgeon: Adin Hector, MD;  Location: [place];  Service: General;  Laterality: Right;  . PORT-A-CATH REMOVAL  10/25/2013  .  PORT-A-CATH REMOVAL N/A 10/25/2013   Procedure: REMOVAL PORT-A-CATH;  Surgeon: Adin Hector, MD;  Location: [place];  Service: General;  Laterality: N/A;  . PORTACATH PLACEMENT Right 04/15/2013   Procedure: INSERTION PORT-A-CATH;  Surgeon: Adin Hector, MD;  Location: [place];  Service: General;  Laterality: Right;  . TONSILLECTOMY    . WISDOM TOOTH EXTRACTION      There were no vitals filed for this visit.      Subjective Assessment - 08/26/16 1435    Subjective I have been doing my exercises. The massage last time really helped because I was so tight.    Pertinent History status post right total mastectomy and right axillary sentinel node biopsy 10/25/2013 for a residual ypT3 ypN0 invasive lobular carcinoma, again estrogen and progesterone receptor positive and HER-2 nonamplified; with negative margins, pt has completed radiation and chemotherapy,    Patient Stated Goals to be able to move the arm through full ROM   Currently in Pain? No/denies                         [facility] Adult PT Treatment/Exercise - 08/26/16 0001      Shoulder Exercises: Supine   Other Supine Exercises meeks decompression, pt requires verbal cueing with scapular retraction     Shoulder Exercises: Standing   Other Standing Exercises with washclothes under hands bilateral UE flexion x 10  reps on wall, and wall washing into abduction x 10 reps bilaterally     Shoulder Exercises: Isometric Strengthening   Flexion 5X5"   Extension 5X5"   ABduction 5X5"     Manual Therapy   Soft tissue mobilization in left sidelying to left upper back, neck, interscapular area and lateral trunk at lattissumus tendon    Passive ROM to R shoulder in direction of flexion, abduction and ER, blocked scapula from elevating during movements                        [facility] Goals - 08/12/16 1713      CC Long Term Goal  #1   Title Pt to demonstrate 140 degrees of right  shoulder flexion to allow her to reach items overhead   Baseline 104   Time 4   Period Weeks   Status New     CC Long Term Goal  #2   Title Pt to demonstrate 150 degrees of right shoulder abduction to allow her to reach items out to sides   Baseline 80   Time 4   Period Weeks   Status New     CC Long Term Goal  #3   Title Pt to demonstrate 60 degrees of right shoulder IR to allow her to return to prior level of function   Baseline 45   Time 4   Period Weeks   Status New     CC Long Term Goal  #4   Title Pt to be independent in a home exercise program for continued strengthening and stretching   Time 4   Period Weeks   Status New     CC Long Term Goal  #5   Title Pt to report at least a 60% improvement in feeling of tightness when using her RUE to allow for improved function   Time 4   Period Weeks   Status New            Plan - 08/26/16 1717    Clinical Impression Statement Patient states therapy has really been helping her. Continued with soft tissue mobililzation to tight musculature in upper back, neck, and lateral trunk on right. Pt still needs verbal cues during [place] exercises for correct technique with scapular retraction.    Rehab Potential Good   Clinical Impairments Affecting Rehab Potential hx of dislocating R shoulder multiple times   PT Frequency 2x / week   PT Duration 4 weeks   PT Treatment/Interventions ADLs/Self Care Home Management;Electrical Stimulation;Iontophoresis 31m/ml Dexamethasone;Therapeutic exercise;Therapeutic activities;Patient/family education;Manual techniques;Scar mobilization;Passive range of motion;Taping   PT Next Visit Plan myofascial to right axilla, no pulleys due to hx of dislocation, conitnue with strengthening and progress slowly, work on scapular retraction    Consulted and Agree with Plan of Care Patient      Patient will benefit from skilled therapeutic intervention in order to improve the following deficits and  impairments:  Increased fascial restricitons, Pain, Decreased range of motion, Decreased strength, Impaired UE functional use, Postural dysfunction, Decreased scar mobility  Visit Diagnosis: Stiffness of right shoulder, not elsewhere classified  Muscle weakness (generalized)  Chronic right shoulder pain     Problem List Patient Active Problem List   Diagnosis Date Noted  . Venous stasis 12/22/2013  . Bilateral lower leg cellulitis 11/11/2013  . Cancer of central portion of right female breast ([place] 10/25/2013  . Abdominal wall hernia 08/23/2013  . Edema of both legs 07/12/2013  .  Essential hypertension, benign 03/16/2013  . Adjustment disorder with mixed anxiety and depressed mood 03/16/2013  . Breast cancer of upper-outer quadrant of right female breast ([place]) 03/10/2013    [facility] 08/26/2016, 5:21 PM  [place], [place], [address] Phone: [phone]   Fax:  [phone]  Name: Denise Becker MRN: [id number] Date of Birth: 1950/04/23  Manus Gunning, PT 08/26/16 5:21 PM

## 2016-08-27 ENCOUNTER — Ambulatory Visit: Payer: Medicare HMO | Admitting: Physical Therapy

## 2016-08-27 DIAGNOSIS — M25511 Pain in right shoulder: Secondary | ICD-10-CM

## 2016-08-27 DIAGNOSIS — M25611 Stiffness of right shoulder, not elsewhere classified: Secondary | ICD-10-CM

## 2016-08-27 DIAGNOSIS — G8929 Other chronic pain: Secondary | ICD-10-CM

## 2016-08-27 DIAGNOSIS — M6281 Muscle weakness (generalized): Secondary | ICD-10-CM

## 2016-08-27 NOTE — Therapy (Signed)
[place], [place], [address] Phone: [phone]   Fax:  [phone]  Physical Therapy Treatment  Patient Details  Name: Denise Becker MRN: [id number] Date of Birth: [birth year] Referring Provider: [name]  Encounter Date: 08/27/2016      PT End of Session - 08/27/16 1228    Visit Number 4   Number of Visits 9   Date for PT Re-Evaluation 09/09/16   PT Start Time 8264   PT Stop Time 1100   PT Time Calculation (min) 45 min   Activity Tolerance Patient tolerated treatment well   Behavior During Therapy [facility] for tasks assessed/performed      Past Medical History:  Diagnosis Date  . Anxiety    anxiousabout impending surgery- loss of breast  . Arthritis    "joints hurt q now and then" (10/25/2013)  . Breast cancer of upper-outer quadrant of right female breast ([place]) 03/10/2013  . Depression    history of depression after son died in the 2000's  . GERD (gastroesophageal reflux disease)    "just chemo related"  . H/O hiatal hernia   . Hypertension   . Radiation 12/08/13-01/17/14   Right chest wall 50.4 Gy  . Spontaneous dislocation of shoulder [phone]   "right; a few times over the years"    Past Surgical History:  Procedure Laterality Date  . ABDOMINAL HYSTERECTOMY  1990's  . BREAST BIOPSY Right 02/2013  . CESAREAN SECTION  1980  . MASTECTOMY COMPLETE / SIMPLE W/ SENTINEL NODE BIOPSY Right 10/25/2013  . MASTECTOMY W/ SENTINEL NODE BIOPSY Right 10/25/2013   Procedure: RIGHT TOTAL MASTECTOMY WITH SENTINEL LYMPH NODE BIOPSY;  Surgeon: Adin Hector, MD;  Location: [place];  Service: General;  Laterality: Right;  . MULTIPLE TOOTH EXTRACTIONS Bilateral 2011-2014 X 6  . PORT A CATH REVISION Right 04/29/2013   Procedure: PORT A CATH REVISION;  Surgeon: Adin Hector, MD;  Location: [place];  Service: General;  Laterality: Right;  . PORT-A-CATH REMOVAL  10/25/2013  .  PORT-A-CATH REMOVAL N/A 10/25/2013   Procedure: REMOVAL PORT-A-CATH;  Surgeon: Adin Hector, MD;  Location: [place];  Service: General;  Laterality: N/A;  . PORTACATH PLACEMENT Right 04/15/2013   Procedure: INSERTION PORT-A-CATH;  Surgeon: Adin Hector, MD;  Location: [place];  Service: General;  Laterality: Right;  . TONSILLECTOMY    . WISDOM TOOTH EXTRACTION      There were no vitals filed for this visit.      Subjective Assessment - 08/27/16 1028    Subjective Pt states "I'm great"    Pertinent History status post right total mastectomy and right axillary sentinel node biopsy 10/25/2013 for a residual ypT3 ypN0 invasive lobular carcinoma, again estrogen and progesterone receptor positive and HER-2 nonamplified; with negative margins, pt has completed radiation and chemotherapy,    Patient Stated Goals to be able to move the arm through full ROM                         [facility] Adult PT Treatment/Exercise - 08/27/16 0001      Self-Care   Self-Care Other Self-Care Comments   Other Self-Care Comments  used mirror for pt to see her back and increased tissue at right posterior axilla.  Cautioned her to not allow bra to fold up into her skin fold pushing more tissue into area.  Showed her Wear[employer]se bra and gave her picture of  [employer] Brooking bra so she knows what to look for the next time she purchages a bra.  She needs one with high sides and back support for lateral chest up into axilla      Shoulder Exercises: Isometric Strengthening   Flexion 5X5"   Extension 5X5"   ABduction 5X5"     Manual Therapy   Manual Therapy Myofascial release   Soft tissue mobilization in left sidelying to left upper back, neck, interscapular area and lateral trunk at lattissumus tendon    Myofascial Release to right anterior chest along scar with instruction for pt to do same for herself and she was able to demonstrate    Passive ROM Gentle PROM in glenohurmeral joint  external rotation without overstretch or joing mobilization                 PT Education - 08/27/16 1227    Education provided Yes   Education Details reviewed HEP isometrics, educated about what to look for in a compression bra , self stretching to scar at anterior chest    Person(s) Educated Patient   Methods Explanation;Demonstration   Comprehension Returned demonstration;Verbalized understanding                [place] - 08/12/16 1713      CC Long Term Goal  #1   Title Pt to demonstrate 140 degrees of right shoulder flexion to allow her to reach items overhead   Baseline 104   Time 4   Period Weeks   Status New     CC Long Term Goal  #2   Title Pt to demonstrate 150 degrees of right shoulder abduction to allow her to reach items out to sides   Baseline 80   Time 4   Period Weeks   Status New     CC Long Term Goal  #3   Title Pt to demonstrate 60 degrees of right shoulder IR to allow her to return to prior level of function   Baseline 45   Time 4   Period Weeks   Status New     CC Long Term Goal  #4   Title Pt to be independent in a home exercise program for continued strengthening and stretching   Time 4   Period Weeks   Status New     CC Long Term Goal  #5   Title Pt to report at least a 60% improvement in feeling of tightness when using her RUE to allow for improved function   Time 4   Period Weeks   Status New            Plan - 08/27/16 1229    Clinical Impression Statement Pt was able to do isometric exercise with rare cues so should be able to continue with that HEP. Educated pt on getting a better compression bra to help with fullness at right posterior back that may be in part due bra folding up into skin fold. She continues to progress   Rehab Potential Good   Clinical Impairments Affecting Rehab Potential hx of dislocating R shoulder multiple times   PT Frequency 2x / week   PT Duration 4 weeks   PT Next Visit Plan  myofascial to right axilla, no pulleys due to hx of dislocation, conitnue with strengthening and progress slowly, work on scapular retraction ,soft tissue work to back    Oncologist with Plan of Care Patient  Patient will benefit from skilled therapeutic intervention in order to improve the following deficits and impairments:  Increased fascial restricitons, Pain, Decreased range of motion, Decreased strength, Impaired UE functional use, Postural dysfunction, Decreased scar mobility  Visit Diagnosis: Stiffness of right shoulder, not elsewhere classified  Muscle weakness (generalized)  Chronic right shoulder pain     Problem List Patient Active Problem List   Diagnosis Date Noted  . Venous stasis 12/22/2013  . Bilateral lower leg cellulitis 11/11/2013  . Cancer of central portion of right female breast ([place]) 10/25/2013  . Abdominal wall hernia 08/23/2013  . Edema of both legs 07/12/2013  . Essential hypertension, benign 03/16/2013  . Adjustment disorder with mixed anxiety and depressed mood 03/16/2013  . Breast cancer of upper-outer quadrant of right female breast ([place]) 03/10/2013   Donato Heinz. Owens Shark PT  [facility] 08/27/2016, 12:35 PM  [place], [place], [address] Phone: [phone]   Fax:  [phone]  Name: AZIE MCCONAHY MRN: [id number] Date of Birth: 10-18-1950

## 2016-08-29 ENCOUNTER — Encounter: Payer: Medicare HMO | Admitting: Physical Therapy

## 2016-09-01 ENCOUNTER — Ambulatory Visit: Payer: Medicare HMO | Attending: Oncology | Admitting: Physical Therapy

## 2016-09-01 DIAGNOSIS — M6281 Muscle weakness (generalized): Secondary | ICD-10-CM | POA: Insufficient documentation

## 2016-09-01 DIAGNOSIS — G8929 Other chronic pain: Secondary | ICD-10-CM | POA: Diagnosis present

## 2016-09-01 DIAGNOSIS — M25611 Stiffness of right shoulder, not elsewhere classified: Secondary | ICD-10-CM | POA: Diagnosis present

## 2016-09-01 DIAGNOSIS — M25511 Pain in right shoulder: Secondary | ICD-10-CM | POA: Diagnosis present

## 2016-09-01 NOTE — Therapy (Signed)
[place], [place], [address] Phone: [phone]   Fax:  [phone]  Physical Therapy Treatment  Patient Details  Name: Denise Becker MRN: [id number] Date of Birth: 11-27-1950 Referring Provider: [name]  Encounter Date: 09/01/2016      PT End of Session - 09/01/16 1706    Visit Number 5   Number of Visits 9   Date for PT Re-Evaluation 09/09/16   PT Start Time 1518   PT Stop Time 1601   PT Time Calculation (min) 43 min   Activity Tolerance Patient tolerated treatment well   Behavior During Therapy [facility] for tasks assessed/performed      Past Medical History:  Diagnosis Date  . Anxiety    anxiousabout impending surgery- loss of breast  . Arthritis    "joints hurt q now and then" (10/25/2013)  . Breast cancer of upper-outer quadrant of right female breast ([place]) 03/10/2013  . Depression    history of depression after son died in the 2000's  . GERD (gastroesophageal reflux disease)    "just chemo related"  . H/O hiatal hernia   . Hypertension   . Radiation 12/08/13-01/17/14   Right chest wall 50.4 Gy  . Spontaneous dislocation of shoulder [phone]   "right; a few times over the years"    Past Surgical History:  Procedure Laterality Date  . ABDOMINAL HYSTERECTOMY  1990's  . BREAST BIOPSY Right 02/2013  . CESAREAN SECTION  1980  . MASTECTOMY COMPLETE / SIMPLE W/ SENTINEL NODE BIOPSY Right 10/25/2013  . MASTECTOMY W/ SENTINEL NODE BIOPSY Right 10/25/2013   Procedure: RIGHT TOTAL MASTECTOMY WITH SENTINEL LYMPH NODE BIOPSY;  Surgeon: Adin Hector, MD;  Location: [place];  Service: General;  Laterality: Right;  . MULTIPLE TOOTH EXTRACTIONS Bilateral 2011-2014 X 6  . PORT A CATH REVISION Right 04/29/2013   Procedure: PORT A CATH REVISION;  Surgeon: Adin Hector, MD;  Location: [place];  Service: General;  Laterality: Right;  . PORT-A-CATH REMOVAL  10/25/2013  .  PORT-A-CATH REMOVAL N/A 10/25/2013   Procedure: REMOVAL PORT-A-CATH;  Surgeon: Adin Hector, MD;  Location: [place];  Service: General;  Laterality: N/A;  . PORTACATH PLACEMENT Right 04/15/2013   Procedure: INSERTION PORT-A-CATH;  Surgeon: Adin Hector, MD;  Location: [place];  Service: General;  Laterality: Right;  . TONSILLECTOMY    . WISDOM TOOTH EXTRACTION      There were no vitals filed for this visit.          [facility] PT Assessment - 09/01/16 0001      AROM   Right Shoulder Flexion 99 Degrees   Right Shoulder ABduction 81 Degrees   Right Shoulder Internal Rotation 70 Degrees  in supine with assist                     [facility] Adult PT Treatment/Exercise - 09/01/16 0001      Exercises   Exercises Shoulder     Shoulder Exercises: Seated   Other Seated Exercises backward shoulder rolls at end of session     Shoulder Exercises: Standing   Other Standing Exercises pt. demonstrated doing flexion with two washcloths making circles and raising up at the same time     Shoulder Exercises: Isometric Strengthening   Flexion Limitations Reviewed isometric shoulder exercises for HEP   Extension Limitations Pt. described doing scapular retraction in supine     Manual Therapy   Soft  tissue mobilization in supine, to right chest incision areas   Myofascial Release in supine, crosshands technique in vertical, horizontal, and diagonal directions across right chest at several different hand placements   Scapular Mobilization in left sidelying for right scapula into protraction at various angles and into depression   Passive ROM to right shoulder into er (and also doing contract/relax for this), for abduction, and flexion                        [facility] Term Clinic Goals - 09/01/16 1519      CC Long Term Goal  #1   Title Pt to demonstrate 140 degrees of right shoulder flexion to allow her to reach items overhead   Status On-going     CC  Long Term Goal  #2   Title Pt to demonstrate 150 degrees of right shoulder abduction to allow her to reach items out to sides   Status On-going     CC Long Term Goal  #3   Title Pt to demonstrate 60 degrees of right shoulder IR to allow her to return to prior level of function   Baseline 45 at eval, 70 on 09/01/16 in supine   Status Achieved     CC Long Term Goal  #4   Title Pt to be independent in a home exercise program for continued strengthening and stretching   Status Partially Met     CC Long Term Goal  #5   Title Pt to report at least a 60% improvement in feeling of tightness when using her RUE to allow for improved function   Baseline "a great amount better--maybe halfway" as of 09/01/16   Status Partially Met            Plan - 09/01/16 1706    Clinical Impression Statement Patient said that some of the myofascial work and stretches felt good today.  She demonstrated the HEP she is currently doing; and may do well to add some things to that.  She has an appointment for a compression bra fitting later this week. Her right shoulder remains very tight.  Goals assessed; overall, partially met.   Rehab Potential Good   Clinical Impairments Affecting Rehab Potential hx of dislocating R shoulder multiple times   PT Frequency 2x / week   PT Duration 4 weeks   PT Treatment/Interventions ADLs/Self Care Home Management;Electrical Stimulation;Iontophoresis 32m/ml Dexamethasone;Therapeutic exercise;Therapeutic activities;Patient/family education;Manual techniques;Scar mobilization;Passive range of motion;Taping   PT Next Visit Plan continue with PROM and myofascial/soft tissue mobilization work to right shoulder   Consulted and Agree with Plan of Care Patient      Patient will benefit from skilled therapeutic intervention in order to improve the following deficits and impairments:  Increased fascial restricitons, Pain, Decreased range of motion, Decreased strength, Impaired UE functional  use, Postural dysfunction, Decreased scar mobility  Visit Diagnosis: Stiffness of right shoulder, not elsewhere classified  Muscle weakness (generalized)  Chronic right shoulder pain     Problem List Patient Active Problem List   Diagnosis Date Noted  . Venous stasis 12/22/2013  . Bilateral lower leg cellulitis 11/11/2013  . [place] of [place] 10/25/2013  . Abdominal wall hernia 08/23/2013  . Edema of both legs 07/12/2013  . Essential hypertension, benign 03/16/2013  . Adjustment disorder with mixed anxiety and depressed mood 03/16/2013  . Breast cancer of upper-outer quadrant of right female breast ([place] 03/10/2013    SALISBURY,DONNA  09/01/2016, 5:10 PM  [place], [place], [address] Phone: [phone]   Fax:  [phone]  Name: Denise Becker MRN: [id number] Date of Birth: [birth year]  Serafina Royals, PT 09/01/16 5:10 PM

## 2016-09-03 ENCOUNTER — Ambulatory Visit: Payer: Medicare HMO | Admitting: Physical Therapy

## 2016-09-03 ENCOUNTER — Encounter: Payer: Self-pay | Admitting: Physical Therapy

## 2016-09-03 DIAGNOSIS — G8929 Other chronic pain: Secondary | ICD-10-CM

## 2016-09-03 DIAGNOSIS — M25511 Pain in right shoulder: Secondary | ICD-10-CM

## 2016-09-03 DIAGNOSIS — M25611 Stiffness of right shoulder, not elsewhere classified: Secondary | ICD-10-CM

## 2016-09-03 DIAGNOSIS — M6281 Muscle weakness (generalized): Secondary | ICD-10-CM

## 2016-09-03 NOTE — Therapy (Signed)
[place], [place], [address] Phone: [phone]   Fax:  [phone]  Physical Therapy Treatment  Patient Details  Name: Denise Becker MRN: [id number] Date of Birth: [birth year] Referring Provider: [name]  Encounter Date: 09/03/2016      PT End of Session - 09/03/16 1643    Visit Number 6   Number of Visits 9   Date for PT Re-Evaluation 09/09/16   PT Start Time 6962   PT Stop Time 1515   PT Time Calculation (min) 57 min   Activity Tolerance Patient tolerated treatment well   Behavior During Therapy [facility] for tasks assessed/performed      Past Medical History:  Diagnosis Date  . Anxiety    anxiousabout impending surgery- loss of breast  . Arthritis    "joints hurt q now and then" (10/25/2013)  . Breast cancer of upper-outer quadrant of right female breast ([place]) 03/10/2013  . Depression    history of depression after son died in the 2000's  . GERD (gastroesophageal reflux disease)    "just chemo related"  . H/O hiatal hernia   . Hypertension   . Radiation 12/08/13-01/17/14   Right chest wall 50.4 Gy  . Spontaneous dislocation of shoulder [phone]   "right; a few times over the years"    Past Surgical History:  Procedure Laterality Date  . ABDOMINAL HYSTERECTOMY  1990's  . BREAST BIOPSY Right 02/2013  . CESAREAN SECTION  1980  . MASTECTOMY COMPLETE / SIMPLE W/ SENTINEL NODE BIOPSY Right 10/25/2013  . MASTECTOMY W/ SENTINEL NODE BIOPSY Right 10/25/2013   Procedure: RIGHT TOTAL MASTECTOMY WITH SENTINEL LYMPH NODE BIOPSY;  Surgeon: Adin Hector, MD;  Location: [place];  Service: General;  Laterality: Right;  . MULTIPLE TOOTH EXTRACTIONS Bilateral 2011-2014 X 6  . PORT A CATH REVISION Right 04/29/2013   Procedure: PORT A CATH REVISION;  Surgeon: Adin Hector, MD;  Location: [place];  Service: General;  Laterality: Right;  . PORT-A-CATH REMOVAL  10/25/2013  .  PORT-A-CATH REMOVAL N/A 10/25/2013   Procedure: REMOVAL PORT-A-CATH;  Surgeon: Adin Hector, MD;  Location: [place];  Service: General;  Laterality: N/A;  . PORTACATH PLACEMENT Right 04/15/2013   Procedure: INSERTION PORT-A-CATH;  Surgeon: Adin Hector, MD;  Location: [place];  Service: General;  Laterality: Right;  . TONSILLECTOMY    . WISDOM TOOTH EXTRACTION      There were no vitals filed for this visit.      Subjective Assessment - 09/03/16 1419    Subjective The exercises are going okay. I love the one on the wall with the washcloth. I do feel drained today. I think I need to drink more water.    Pertinent History status post right total mastectomy and right axillary sentinel node biopsy 10/25/2013 for a residual ypT3 ypN0 invasive lobular carcinoma, again estrogen and progesterone receptor positive and HER-2 nonamplified; with negative margins, pt has completed radiation and chemotherapy,    Patient Stated Goals to be able to move the arm through full ROM   Currently in Pain? No/denies                         [facility] Adult PT Treatment/Exercise - 09/03/16 0001      Manual Therapy   Soft tissue mobilization in supine, to right chest incision areas, and to right pec   Myofascial Release in supine, crosshands technique in  vertical, horizontal, and diagonal directions across right chest at several different hand placements   Scapular Mobilization in left sidelying for right scapula into protraction at various angles and into depression   Passive ROM to right shoulder into er (and also doing contract/relax for this), for abduction, and flexion                        Long Term Clinic Goals - 09/01/16 1519      CC Long Term Goal  #1   Title Pt to demonstrate 140 degrees of right shoulder flexion to allow her to reach items overhead   Status On-going     CC Long Term Goal  #2   Title Pt to demonstrate 150 degrees of right shoulder  abduction to allow her to reach items out to sides   Status On-going     CC Long Term Goal  #3   Title Pt to demonstrate 60 degrees of right shoulder IR to allow her to return to prior level of function   Baseline 45 at eval, 70 on 09/01/16 in supine   Status Achieved     CC Long Term Goal  #4   Title Pt to be independent in a home exercise program for continued strengthening and stretching   Status Partially Met     CC Long Term Goal  #5   Title Pt to report at least a 60% improvement in feeling of tightness when using her RUE to allow for improved function   Baseline "a great amount better--maybe halfway" as of 09/01/16   Status Partially Met            Plan - 09/03/16 1644    Clinical Impression Statement Pt continues to feel benefits from myofascial and stretches. Her right pec is still very tight. Performed soft tissue mobilization to this area today and myofascial around scar. She received her compression bra. She is still very limited with right shoulder ROM. She has pain with right shoulder abduction without UE pulling.    Rehab Potential Good   Clinical Impairments Affecting Rehab Potential hx of dislocating R shoulder multiple times   PT Frequency 2x / week   PT Duration 4 weeks   PT Treatment/Interventions ADLs/Self Care Home Management;Electrical Stimulation;Iontophoresis 59m/ml Dexamethasone;Therapeutic exercise;Therapeutic activities;Patient/family education;Manual techniques;Scar mobilization;Passive range of motion;Taping   PT Next Visit Plan continue with PROM and myofascial/soft tissue mobilization work to right shoulder   Consulted and Agree with Plan of Care Patient      Patient will benefit from skilled therapeutic intervention in order to improve the following deficits and impairments:  Increased fascial restricitons, Pain, Decreased range of motion, Decreased strength, Impaired UE functional use, Postural dysfunction, Decreased scar mobility  Visit  Diagnosis: Stiffness of right shoulder, not elsewhere classified  Muscle weakness (generalized)  Chronic right shoulder pain     Problem List Patient Active Problem List   Diagnosis Date Noted  . Venous stasis 12/22/2013  . Bilateral lower leg cellulitis 11/11/2013  . Cancer of central portion of right female breast ([place] 10/25/2013  . Abdominal wall hernia 08/23/2013  . Edema of both legs 07/12/2013  . Essential hypertension, benign 03/16/2013  . Adjustment disorder with mixed anxiety and depressed mood 03/16/2013  . Breast cancer of upper-outer quadrant of right female breast ([place] 03/10/2013    BKanab6/08/2016, 4:46 PM  [place] [address]: [phone]  Fax:  [phone]  Name: Denise Becker MRN: [id number] Date of Birth: 28-Jan-1951  Manus Gunning, PT 09/03/16 4:46 PM

## 2016-09-10 ENCOUNTER — Ambulatory Visit: Payer: Medicare HMO | Admitting: Physical Therapy

## 2016-09-10 DIAGNOSIS — M25611 Stiffness of right shoulder, not elsewhere classified: Secondary | ICD-10-CM

## 2016-09-10 DIAGNOSIS — M6281 Muscle weakness (generalized): Secondary | ICD-10-CM

## 2016-09-10 DIAGNOSIS — G8929 Other chronic pain: Secondary | ICD-10-CM

## 2016-09-10 DIAGNOSIS — M25511 Pain in right shoulder: Secondary | ICD-10-CM

## 2016-09-10 NOTE — Therapy (Signed)
[place], [place], [address] Phone: [phone]   Fax:  [phone]  Physical Therapy Treatment  Patient Details  Name: Denise Becker MRN: [id number] Date of Birth: Nov 01, 1950 Referring Provider: [name]  Encounter Date: 09/10/2016      PT End of Session - 09/10/16 1243    Visit Number 7   Number of Visits 17   Date for PT Re-Evaluation 10/10/16   PT Start Time 1030   PT Stop Time 1110   PT Time Calculation (min) 40 min   Activity Tolerance Patient tolerated treatment well   Behavior During Therapy [facility] for tasks assessed/performed      Past Medical History:  Diagnosis Date  . Anxiety    anxiousabout impending surgery- loss of breast  . Arthritis    "joints hurt q now and then" (10/25/2013)  . Breast cancer of upper-outer quadrant of right female breast ([place]) 03/10/2013  . Depression    history of depression after son died in the 2000's  . GERD (gastroesophageal reflux disease)    "just chemo related"  . H/O hiatal hernia   . Hypertension   . Radiation 12/08/13-01/17/14   Right chest wall 50.4 Gy  . Spontaneous dislocation of shoulder [phone]   "right; a few times over the years"    Past Surgical History:  Procedure Laterality Date  . ABDOMINAL HYSTERECTOMY  1990's  . BREAST BIOPSY Right 02/2013  . CESAREAN SECTION  1980  . MASTECTOMY COMPLETE / SIMPLE W/ SENTINEL NODE BIOPSY Right 10/25/2013  . MASTECTOMY W/ SENTINEL NODE BIOPSY Right 10/25/2013   Procedure: RIGHT TOTAL MASTECTOMY WITH SENTINEL LYMPH NODE BIOPSY;  Surgeon: Adin Hector, MD;  Location: [place];  Service: General;  Laterality: Right;  . MULTIPLE TOOTH EXTRACTIONS Bilateral 2011-2014 X 6  . PORT A CATH REVISION Right 04/29/2013   Procedure: PORT A CATH REVISION;  Surgeon: Adin Hector, MD;  Location: [place];  Service: General;  Laterality: Right;  . PORT-A-CATH REMOVAL  10/25/2013  .  PORT-A-CATH REMOVAL N/A 10/25/2013   Procedure: REMOVAL PORT-A-CATH;  Surgeon: Adin Hector, MD;  Location: [place];  Service: General;  Laterality: N/A;  . PORTACATH PLACEMENT Right 04/15/2013   Procedure: INSERTION PORT-A-CATH;  Surgeon: Adin Hector, MD;  Location: [place];  Service: General;  Laterality: Right;  . TONSILLECTOMY    . WISDOM TOOTH EXTRACTION      There were no vitals filed for this visit.      Subjective Assessment - 09/10/16 1042    Subjective pt got her compression bra and it fits okay but she is not wearing it today  She feels that she is making progress but she "is not there yet"  She wants to continue PT to get more function in her arm.    Pertinent History status post right total mastectomy and right axillary sentinel node biopsy 10/25/2013 for a residual ypT3 ypN0 invasive lobular carcinoma, again estrogen and progesterone receptor positive and HER-2 nonamplified; with negative margins, pt has completed radiation and chemotherapy,    Patient Stated Goals to be able to move the arm through full ROM   Currently in Pain? No/denies            [facility] PT Assessment - 09/10/16 0001      AROM   Right Shoulder Flexion 122 Degrees   Right Shoulder ABduction 128 Degrees  [facility] Adult PT Treatment/Exercise - 09/10/16 0001      Shoulder Exercises: Supine   Protraction AROM;Right;10 reps   Horizontal ABduction Strengthening;Both;5 reps;Theraband   Theraband Level (Shoulder Horizontal ABduction) Level 2 (Red)   External Rotation Strengthening;Both;5 reps;Theraband   Theraband Level (Shoulder External Rotation) Level 2 (Red)   Flexion Strengthening;Both;5 reps;Theraband  narrow and wide grip    Other Supine Exercises diagonal elevation with each arm with red theraband starting at midline      Shoulder Exercises: Sidelying   External Rotation AROM;Right;5 reps   ABduction AROM;Right;10 reps     Manual Therapy    Soft tissue mobilization in left sidelying to left upper back, neck, interscapular area and lateral trunk at lattissumus tendon    Scapular Mobilization in left sidelying for right scapula into protraction at various angles and into depression with prlonged stretch at inferior glide to stretch upper traps                 PT Education - 09/10/16 1243    Education provided Yes   Education Details supine scapular series with red theraband    Person(s) Educated Patient   Methods Explanation;Demonstration;Handout   Comprehension Verbalized understanding;Returned demonstration                [facility] Goals - 09/10/16 1247      CC Long Term Goal  #1   Title Pt to demonstrate 140 degrees of right shoulder flexion to allow her to reach items overhead   Baseline 104,  122 on 09/10/2016   Period Weeks   Status On-going     CC Long Term Goal  #2   Title Pt to demonstrate 150 degrees of right shoulder abduction to allow her to reach items out to sides   Baseline 80, 128 on 09/10/2016   Status On-going     CC Long Term Goal  #3   Title Pt to demonstrate 60 degrees of right shoulder IR to allow her to return to prior level of function   Baseline 45 at eval, 70 on 09/01/16 in supine   Status Achieved     CC Long Term Goal  #4   Title Pt to be independent in a home exercise program for continued strengthening and stretching   Status Partially Met     CC Long Term Goal  #5   Title Pt to report at least a 60% improvement in feeling of tightness when using her RUE to allow for improved function   Baseline "a great amount better--maybe halfway" as of 09/01/16, on 6/13 pt states this is a big improvement , but feel there could be more    Status On-going            Plan - 09/10/16 1244    Clinical Impression Statement Pt has improved but still has decreased strength and range of motion of right shoulder with dereased function She has tightness in right upper traps, neck  and posterior axilla.  She as difficulty with scapular activation, especially into retraction.  Feel  she would benefit from continued PT to work on these areas    Rehab Potential Good   Clinical Impairments Affecting Rehab Potential hx of dislocating R shoulder multiple times   PT Frequency 2x / week   PT Duration 4 weeks   PT Treatment/Interventions ADLs/Self Care Home Management;Electrical Stimulation;Iontophoresis 31m/ml Dexamethasone;Therapeutic exercise;Therapeutic activities;Patient/family education;Manual techniques;Scar mobilization;Passive range of motion;Taping   PT Next Visit Plan continue with PROM and  myofascial/soft tissue mobilization work to right shoulder, posterior upper back and neck.    Consulted and Agree with Plan of Care Patient      Patient will benefit from skilled therapeutic intervention in order to improve the following deficits and impairments:  Increased fascial restricitons, Pain, Decreased range of motion, Decreased strength, Impaired UE functional use, Postural dysfunction, Decreased scar mobility  Visit Diagnosis: Stiffness of right shoulder, not elsewhere classified  Muscle weakness (generalized)  Chronic right shoulder pain     Problem List Patient Active Problem List   Diagnosis Date Noted  . Venous stasis 12/22/2013  . Bilateral lower leg cellulitis 11/11/2013  . Cancer of central portion of right female breast ([place]) 10/25/2013  . Abdominal wall hernia 08/23/2013  . Edema of both legs 07/12/2013  . Essential hypertension, benign 03/16/2013  . Adjustment disorder with mixed anxiety and depressed mood 03/16/2013  . Breast cancer of upper-outer quadrant of right female breast ([place]) 03/10/2013   Donato Heinz. Owens Shark PT  Norwood Levo 09/10/2016, 12:52 PM  [place], [place], [address] Phone: [phone]   Fax:  [phone]  Name: Denise Becker MRN:  [id number] Date of Birth: 09-Dec-1950

## 2016-09-10 NOTE — Patient Instructions (Signed)
Over Head Pull: Narrow Grip     [place] [phone]   On back, knees bent, feet flat, band across thighs, elbows straight but relaxed. Pull hands apart (start). Keeping elbows straight, bring arms up and over head, hands toward floor. Keep pull steady on band. Hold momentarily. Return slowly, keeping pull steady, back to start. Repeat ___ times. Band color ______   Side Pull: Double Arm   On back, knees bent, feet flat. Arms perpendicular to body, shoulder level, elbows straight but relaxed. Pull arms out to sides, elbows straight. Resistance band comes across collarbones, hands toward floor. Hold momentarily. Slowly return to starting position. Repeat ___ times. Band color _____   Sash   On back, knees bent, feet flat, left hand on left hip, right hand above left. Pull right arm DIAGONALLY (hip to shoulder) across chest. Bring right arm along head toward floor. Hold momentarily. Slowly return to starting position. Repeat ___ times. Do with left arm. Band color ______   Shoulder Rotation: Double Arm   On back, knees bent, feet flat, elbows tucked at sides, bent 90, hands palms up. Pull hands apart and down toward floor, keeping elbows near sides. Hold momentarily. Slowly return to starting position. Repeat ___ times. Band color ______   

## 2016-09-15 ENCOUNTER — Encounter: Payer: Self-pay | Admitting: Physical Therapy

## 2016-09-15 ENCOUNTER — Ambulatory Visit: Payer: Medicare HMO | Admitting: Physical Therapy

## 2016-09-15 DIAGNOSIS — M25511 Pain in right shoulder: Secondary | ICD-10-CM

## 2016-09-15 DIAGNOSIS — M6281 Muscle weakness (generalized): Secondary | ICD-10-CM

## 2016-09-15 DIAGNOSIS — G8929 Other chronic pain: Secondary | ICD-10-CM

## 2016-09-15 DIAGNOSIS — M25611 Stiffness of right shoulder, not elsewhere classified: Secondary | ICD-10-CM

## 2016-09-15 NOTE — Therapy (Signed)
[place], [place], [address] Phone: [phone]   Fax:  [phone]  Physical Therapy Treatment  Patient Details  Name: Denise Becker MRN: [id number] Date of Birth: 1950/10/15 Referring Provider: [name]  Encounter Date: 09/15/2016      PT End of Session - 09/15/16 1519    Visit Number 8   Number of Visits 17   Date for PT Re-Evaluation 10/10/16   PT Start Time 1194   PT Stop Time 1515   PT Time Calculation (min) 44 min   Activity Tolerance Patient tolerated treatment well   Behavior During Therapy [facility] for tasks assessed/performed      Past Medical History:  Diagnosis Date  . Anxiety    anxiousabout impending surgery- loss of breast  . Arthritis    "joints hurt q now and then" (10/25/2013)  . Breast cancer of upper-outer quadrant of right female breast ([place]) 03/10/2013  . Depression    history of depression after son died in the 2000's  . GERD (gastroesophageal reflux disease)    "just chemo related"  . H/O hiatal hernia   . Hypertension   . Radiation 12/08/13-01/17/14   Right chest wall 50.4 Gy  . Spontaneous dislocation of shoulder [phone]   "right; a few times over the years"    Past Surgical History:  Procedure Laterality Date  . ABDOMINAL HYSTERECTOMY  1990's  . BREAST BIOPSY Right 02/2013  . CESAREAN SECTION  1980  . MASTECTOMY COMPLETE / SIMPLE W/ SENTINEL NODE BIOPSY Right 10/25/2013  . MASTECTOMY W/ SENTINEL NODE BIOPSY Right 10/25/2013   Procedure: RIGHT TOTAL MASTECTOMY WITH SENTINEL LYMPH NODE BIOPSY;  Surgeon: Adin Hector, MD;  Location: [place];  Service: General;  Laterality: Right;  . MULTIPLE TOOTH EXTRACTIONS Bilateral 2011-2014 X 6  . PORT A CATH REVISION Right 04/29/2013   Procedure: PORT A CATH REVISION;  Surgeon: Adin Hector, MD;  Location: [place];  Service: General;  Laterality: Right;  . PORT-A-CATH REMOVAL  10/25/2013  .  PORT-A-CATH REMOVAL N/A 10/25/2013   Procedure: REMOVAL PORT-A-CATH;  Surgeon: Adin Hector, MD;  Location: [place];  Service: General;  Laterality: N/A;  . PORTACATH PLACEMENT Right 04/15/2013   Procedure: INSERTION PORT-A-CATH;  Surgeon: Adin Hector, MD;  Location: [place];  Service: General;  Laterality: Right;  . TONSILLECTOMY    . WISDOM TOOTH EXTRACTION      There were no vitals filed for this visit.      Subjective Assessment - 09/15/16 1433    Subjective I fell on Friday carrying groceries in to the house. I do not know what happened it happened so fast. My right shoulder is sore so I haven't been doing too many exercises.   Pertinent History status post right total mastectomy and right axillary sentinel node biopsy 10/25/2013 for a residual ypT3 ypN0 invasive lobular carcinoma, again estrogen and progesterone receptor positive and HER-2 nonamplified; with negative margins, pt has completed radiation and chemotherapy,    Patient Stated Goals to be able to move the arm through full ROM   Currently in Pain? No/denies                         [facility] Adult PT Treatment/Exercise - 09/15/16 0001      Shoulder Exercises: Supine   Horizontal ABduction Strengthening;Both;Theraband;10 reps   Theraband Level (Shoulder Horizontal ABduction) Level 2 (Red)   External Rotation  Strengthening;Both;Theraband;10 reps   Theraband Level (Shoulder External Rotation) Level 2 (Red)   Flexion Strengthening;Both;10 reps;Theraband  narrow and wide grip   Theraband Level (Shoulder Flexion) Level 2 (Red)   Other Supine Exercises D2 with each arm with red theraband x 10 each     Manual Therapy   Soft tissue mobilization in left sidelying to left upper back, neck, interscapular area and lateral trunk at lattissumus tendon    Scapular Mobilization in left sidelying for right scapula into protraction at various angles and into depression with prlonged stretch at inferior  glide to stretch upper traps                         Long Term Clinic Goals - 09/10/16 1247      CC Long Term Goal  #1   Title Pt to demonstrate 140 degrees of right shoulder flexion to allow her to reach items overhead   Baseline 104,  122 on 09/10/2016   Period Weeks   Status On-going     CC Long Term Goal  #2   Title Pt to demonstrate 150 degrees of right shoulder abduction to allow her to reach items out to sides   Baseline 80, 128 on 09/10/2016   Status On-going     CC Long Term Goal  #3   Title Pt to demonstrate 60 degrees of right shoulder IR to allow her to return to prior level of function   Baseline 45 at eval, 70 on 09/01/16 in supine   Status Achieved     CC Long Term Goal  #4   Title Pt to be independent in a home exercise program for continued strengthening and stretching   Status Partially Met     CC Long Term Goal  #5   Title Pt to report at least a 60% improvement in feeling of tightness when using her RUE to allow for improved function   Baseline "a great amount better--maybe halfway" as of 09/01/16, on 6/13 pt states this is a big improvement , but feel there could be more    Status On-going            Plan - 09/15/16 1519    Clinical Impression Statement Patient had a fall on Friday and fell on her right shoulder. She states she was carrying groceries and tried to catch a can that was falling and fell into the pine bark. She has soreness in her right shoulder today. She was able to complete supine scap series today x 10 reps each using red band. Performed soft tissue mobilization and scapular moblization to right shoulder and upper traps. Patient reported greatly decreased soreness at end of session.    Rehab Potential Good   Clinical Impairments Affecting Rehab Potential hx of dislocating R shoulder multiple times   PT Frequency 2x / week   PT Duration 4 weeks   PT Treatment/Interventions ADLs/Self Care Home Management;Electrical  Stimulation;Iontophoresis 73m/ml Dexamethasone;Therapeutic exercise;Therapeutic activities;Patient/family education;Manual techniques;Scar mobilization;Passive range of motion;Taping   PT Next Visit Plan continue with PROM and myofascial/soft tissue mobilization work to right shoulder, posterior upper back and neck.    Consulted and Agree with Plan of Care Patient      Patient will benefit from skilled therapeutic intervention in order to improve the following deficits and impairments:  Increased fascial restricitons, Pain, Decreased range of motion, Decreased strength, Impaired UE functional use, Postural dysfunction, Decreased scar mobility  Visit Diagnosis: Stiffness of right  shoulder, not elsewhere classified  Muscle weakness (generalized)  Chronic right shoulder pain     Problem List Patient Active Problem List   Diagnosis Date Noted  . Venous stasis 12/22/2013  . Bilateral lower leg cellulitis 11/11/2013  . Cancer of central portion of right female breast ([place]) 10/25/2013  . Abdominal wall hernia 08/23/2013  . Edema of both legs 07/12/2013  . Essential hypertension, benign 03/16/2013  . Adjustment disorder with mixed anxiety and depressed mood 03/16/2013  . Breast cancer of upper-outer quadrant of right female breast ([place]) 03/10/2013    Allyson Sabal [facility] 09/15/2016, 3:23 PM  [place], [place], [address] Phone: [phone]   Fax:  [phone]  Name: Denise Becker MRN: [id number] Date of Birth: September 24, 1950  Manus Gunning, PT 09/15/16 3:23 PM

## 2016-09-17 ENCOUNTER — Ambulatory Visit: Payer: Medicare HMO | Admitting: Physical Therapy

## 2016-09-17 DIAGNOSIS — G8929 Other chronic pain: Secondary | ICD-10-CM

## 2016-09-17 DIAGNOSIS — M25511 Pain in right shoulder: Secondary | ICD-10-CM

## 2016-09-17 DIAGNOSIS — M25611 Stiffness of right shoulder, not elsewhere classified: Secondary | ICD-10-CM

## 2016-09-17 DIAGNOSIS — M6281 Muscle weakness (generalized): Secondary | ICD-10-CM

## 2016-09-17 NOTE — Therapy (Signed)
[place], [place], [address] Phone: [phone]   Fax:  [phone]  Physical Therapy Treatment  Patient Details  Name: Denise Becker MRN: [id number] Date of Birth: [birth year] Referring Provider: [name]  Encounter Date: 09/17/2016      PT End of Session - 09/17/16 1227    Visit Number 9   Number of Visits 17   Date for PT Re-Evaluation 10/10/16   PT Start Time 0930   PT Stop Time 1015   PT Time Calculation (min) 45 min   Activity Tolerance Patient tolerated treatment well   Behavior During Therapy [facility] for tasks assessed/performed      Past Medical History:  Diagnosis Date  . Anxiety    anxiousabout impending surgery- loss of breast  . Arthritis    "joints hurt q now and then" (10/25/2013)  . Breast cancer of upper-outer quadrant of right female breast ([place]) 03/10/2013  . Depression    history of depression after son died in the 2000's  . GERD (gastroesophageal reflux disease)    "just chemo related"  . H/O hiatal hernia   . Hypertension   . Radiation 12/08/13-01/17/14   Right chest wall 50.4 Gy  . Spontaneous dislocation of shoulder [phone]   "right; a few times over the years"    Past Surgical History:  Procedure Laterality Date  . ABDOMINAL HYSTERECTOMY  1990's  . BREAST BIOPSY Right 02/2013  . CESAREAN SECTION  1980  . MASTECTOMY COMPLETE / SIMPLE W/ SENTINEL NODE BIOPSY Right 10/25/2013  . MASTECTOMY W/ SENTINEL NODE BIOPSY Right 10/25/2013   Procedure: RIGHT TOTAL MASTECTOMY WITH SENTINEL LYMPH NODE BIOPSY;  Surgeon: Adin Hector, MD;  Location: [place];  Service: General;  Laterality: Right;  . MULTIPLE TOOTH EXTRACTIONS Bilateral 2011-2014 X 6  . PORT A CATH REVISION Right 04/29/2013   Procedure: PORT A CATH REVISION;  Surgeon: Adin Hector, MD;  Location: [place];  Service: General;  Laterality: Right;  . PORT-A-CATH REMOVAL  10/25/2013  .  PORT-A-CATH REMOVAL N/A 10/25/2013   Procedure: REMOVAL PORT-A-CATH;  Surgeon: Adin Hector, MD;  Location: [place];  Service: General;  Laterality: N/A;  . PORTACATH PLACEMENT Right 04/15/2013   Procedure: INSERTION PORT-A-CATH;  Surgeon: Adin Hector, MD;  Location: [place];  Service: General;  Laterality: Right;  . TONSILLECTOMY    . WISDOM TOOTH EXTRACTION      There were no vitals filed for this visit.      Subjective Assessment - 09/17/16 0942    Subjective "Every now and then I can just feel it every once a while "  re: shoulder.  Pt is going to MD to day to see about her enlarged abdomen.  She said, at one point ,she was told she has a hernia .   Pertinent History status post right total mastectomy and right axillary sentinel node biopsy 10/25/2013 for a residual ypT3 ypN0 invasive lobular carcinoma, again estrogen and progesterone receptor positive and HER-2 nonamplified; with negative margins, pt has completed radiation and chemotherapy,    Patient Stated Goals to be able to move the arm through full ROM   Currently in Pain? No/denies  evey once in a while                          [facility] Adult PT Treatment/Exercise - 09/17/16 0001      Shoulder Exercises: Standing  Row Strengthening;Both;10 reps;Theraband   Theraband Level (Shoulder Row) Level 2 (Red)   Row Limitations low rows with elbows straight      Shoulder Exercises: Pulleys   Flexion 2 minutes   ABduction 2 minutes     Manual Therapy   Soft tissue mobilization in sitting , to left upper back, neck, interscapular area and lateral trunk at lattissumus tendon                         Long Term Clinic Goals - 09/10/16 1247      CC Long Term Goal  #1   Title Pt to demonstrate 140 degrees of right shoulder flexion to allow her to reach items overhead   Baseline 104,  122 on 09/10/2016   Period Weeks   Status On-going     CC Long Term Goal  #2   Title Pt to  demonstrate 150 degrees of right shoulder abduction to allow her to reach items out to sides   Baseline 80, 128 on 09/10/2016   Status On-going     CC Long Term Goal  #3   Title Pt to demonstrate 60 degrees of right shoulder IR to allow her to return to prior level of function   Baseline 45 at eval, 70 on 09/01/16 in supine   Status Achieved     CC Long Term Goal  #4   Title Pt to be independent in a home exercise program for continued strengthening and stretching   Status Partially Met     CC Long Term Goal  #5   Title Pt to report at least a 60% improvement in feeling of tightness when using her RUE to allow for improved function   Baseline "a great amount better--maybe halfway" as of 09/01/16, on 6/13 pt states this is a big improvement , but feel there could be more    Status On-going            Plan - 09/17/16 1228    Clinical Impression Statement Pt continues to have very tight, tender trigger points in left upper back and posterior axilla that is improved some with soft tissue work.  Emphasized standing posture today.  Pt may benefit from dry needling at some point to this area, but wants to find out about her abdominal issues first    Rehab Potential Good   Clinical Impairments Affecting Rehab Potential hx of dislocating R shoulder multiple times   PT Frequency 2x / week   PT Duration 4 weeks   PT Treatment/Interventions ADLs/Self Care Home Management;Electrical Stimulation;Iontophoresis 59m/ml Dexamethasone;Therapeutic exercise;Therapeutic activities;Patient/family education;Manual techniques;Scar mobilization;Passive range of motion;Taping   PT Next Visit Plan continue  myofascial/soft tissue mobilization work to right shoulder, posterior upper back and neck. consider dry needling later if no improvement    Consulted and Agree with Plan of Care Patient      Patient will benefit from skilled therapeutic intervention in order to improve the following deficits and impairments:   Increased fascial restricitons, Pain, Decreased range of motion, Decreased strength, Impaired UE functional use, Postural dysfunction, Decreased scar mobility  Visit Diagnosis: Muscle weakness (generalized)  Chronic right shoulder pain  Stiffness of right shoulder, not elsewhere classified     Problem List Patient Active Problem List   Diagnosis Date Noted  . Venous stasis 12/22/2013  . Bilateral lower leg cellulitis 11/11/2013  . Cancer of central portion of right female breast ([place] 10/25/2013  . Abdominal wall hernia 08/23/2013  .  Edema of both legs 07/12/2013  . Essential hypertension, benign 03/16/2013  . Adjustment disorder with mixed anxiety and depressed mood 03/16/2013  . Breast cancer of upper-outer quadrant of right female breast ([place]) 03/10/2013   Donato Heinz. Owens Shark PT  Norwood Levo 09/17/2016, 12:30 PM  [place], [place], [address] Phone: [phone]   Fax:  [phone]  Name: Denise Becker MRN: [id number] Date of Birth: Jan 06, 1951

## 2016-09-22 ENCOUNTER — Ambulatory Visit: Payer: Medicare HMO | Admitting: Physical Therapy

## 2016-09-22 DIAGNOSIS — G8929 Other chronic pain: Secondary | ICD-10-CM

## 2016-09-22 DIAGNOSIS — M25511 Pain in right shoulder: Principal | ICD-10-CM

## 2016-09-22 DIAGNOSIS — M25611 Stiffness of right shoulder, not elsewhere classified: Secondary | ICD-10-CM | POA: Diagnosis not present

## 2016-09-22 NOTE — Patient Instructions (Addendum)
Internal Rotator Cuff Stretch, Standing  [phone]     YOU CAN DO THIS USING A TOWEL INSTEAD OF A GOLF CLUB! Stand holding club behind body, one arm above head, other arm bent behind back. With upper hand, pull gently upward. Hold _30__ seconds. Repeat __1-2_ times per session. Do _2__ sessions per day.  Copyright  VHI. All rights reserved.

## 2016-09-22 NOTE — Therapy (Signed)
[place], [place], [address] Phone: [phone]   Fax:  [phone]  Physical Therapy Treatment  Patient Details  Name: ANALYSE ANGST MRN: [id number] Date of Birth: [birth year] Referring Provider: [name]  Encounter Date: 09/22/2016      PT End of Session - 09/22/16 1230    Visit Number 10   Number of Visits 17   Date for PT Re-Evaluation 10/10/16   PT Start Time 1109   PT Stop Time 1154   PT Time Calculation (min) 45 min   Activity Tolerance Patient tolerated treatment well   Behavior During Therapy [facility] for tasks assessed/performed      Past Medical History:  Diagnosis Date  . Anxiety    anxiousabout impending surgery- loss of breast  . Arthritis    "joints hurt q now and then" (10/25/2013)  . Breast cancer of upper-outer quadrant of right female breast ([place]) 03/10/2013  . Depression    history of depression after son died in the 2000's  . GERD (gastroesophageal reflux disease)    "just chemo related"  . H/O hiatal hernia   . Hypertension   . Radiation 12/08/13-01/17/14   Right chest wall 50.4 Gy  . Spontaneous dislocation of shoulder [phone]   "right; a few times over the years"    Past Surgical History:  Procedure Laterality Date  . ABDOMINAL HYSTERECTOMY  1990's  . BREAST BIOPSY Right 02/2013  . CESAREAN SECTION  1980  . MASTECTOMY COMPLETE / SIMPLE W/ SENTINEL NODE BIOPSY Right 10/25/2013  . MASTECTOMY W/ SENTINEL NODE BIOPSY Right 10/25/2013   Procedure: RIGHT TOTAL MASTECTOMY WITH SENTINEL LYMPH NODE BIOPSY;  Surgeon: Adin Hector, MD;  Location: [place];  Service: General;  Laterality: Right;  . MULTIPLE TOOTH EXTRACTIONS Bilateral 2011-2014 X 6  . PORT A CATH REVISION Right 04/29/2013   Procedure: PORT A CATH REVISION;  Surgeon: Adin Hector, MD;  Location: [place];  Service: General;  Laterality: Right;  . PORT-A-CATH REMOVAL  10/25/2013  .  PORT-A-CATH REMOVAL N/A 10/25/2013   Procedure: REMOVAL PORT-A-CATH;  Surgeon: Adin Hector, MD;  Location: [place];  Service: General;  Laterality: N/A;  . PORTACATH PLACEMENT Right 04/15/2013   Procedure: INSERTION PORT-A-CATH;  Surgeon: Adin Hector, MD;  Location: [place];  Service: General;  Laterality: Right;  . TONSILLECTOMY    . WISDOM TOOTH EXTRACTION      There were no vitals filed for this visit.      Subjective Assessment - 09/22/16 1114    Subjective "Nothing's changed.  Dr. Ayesha Rumpf is setting up for some tests for the abdomen."   Currently in Pain? No/denies                         [facility] Adult PT Treatment/Exercise - 09/22/16 0001      Shoulder Exercises: Standing   ABduction AAROM;Right;5 reps  at wall finger ladder to #22     Shoulder Exercises: Pulleys   Flexion 2 minutes   ABduction 2 minutes     Shoulder Exercises: Therapy Ball   Flexion 10 reps     Shoulder Exercises: Stretch   Internal Rotation Stretch 30 seconds  using towel     Manual Therapy   Myofascial Release crosshands across right axilla with arm in partial abduction; right UE myofascial pulling; crosshands from right upper arm to upper mid- and then upper right abdomen with prolonged  holds; crosshands in vertical and horizontal across shoulders   Passive ROM to right shoulder in supine for ir, er, abduction, and flexion                PT Education - 09/22/16 1229    Education provided Yes   Education Details right shoulder internal rotation stretch using towel behind back   Person(s) Educated Patient   Methods Explanation;Tactile cues;Verbal cues   Comprehension Verbalized understanding;Returned demonstration                Long Term Clinic Goals - 09/10/16 1247      CC Long Term Goal  #1   Title Pt to demonstrate 140 degrees of right shoulder flexion to allow her to reach items overhead   Baseline 104,  122 on 09/10/2016   Period  Weeks   Status On-going     CC Long Term Goal  #2   Title Pt to demonstrate 150 degrees of right shoulder abduction to allow her to reach items out to sides   Baseline 80, 128 on 09/10/2016   Status On-going     CC Long Term Goal  #3   Title Pt to demonstrate 60 degrees of right shoulder IR to allow her to return to prior level of function   Baseline 45 at eval, 70 on 09/01/16 in supine   Status Achieved     CC Long Term Goal  #4   Title Pt to be independent in a home exercise program for continued strengthening and stretching   Status Partially Met     CC Long Term Goal  #5   Title Pt to report at least a 60% improvement in feeling of tightness when using her RUE to allow for improved function   Baseline "a great amount better--maybe halfway" as of 09/01/16, on 6/13 pt states this is a big improvement , but feel there could be more    Status On-going            Plan - 09/22/16 1230    Clinical Impression Statement Pt. felt good stretches with myofascial release. She noted benefit of have several different therapists because each has different techniques that she gets to take advantage of. Added towel behind back stretch for right shoulder internal rotation.   Rehab Potential Good   Clinical Impairments Affecting Rehab Potential hx of dislocating R shoulder multiple times   PT Frequency 2x / week   PT Duration 4 weeks   PT Treatment/Interventions ADLs/Self Care Home Management;Electrical Stimulation;Iontophoresis 43m/ml Dexamethasone;Therapeutic exercise;Therapeutic activities;Patient/family education;Manual techniques;Scar mobilization;Passive range of motion;Taping   PT Next Visit Plan continue  myofascial/soft tissue mobilization work to right shoulder, posterior upper back and neck. consider dry needling later if no improvement    PT Home Exercise Plan add ir stretch   Consulted and Agree with Plan of Care Patient      Patient will benefit from skilled therapeutic intervention  in order to improve the following deficits and impairments:  Increased fascial restricitons, Pain, Decreased range of motion, Decreased strength, Impaired UE functional use, Postural dysfunction, Decreased scar mobility  Visit Diagnosis: Chronic right shoulder pain  Stiffness of right shoulder, not elsewhere classified     Problem List Patient Active Problem List   Diagnosis Date Noted  . Venous stasis 12/22/2013  . Bilateral lower leg cellulitis 11/11/2013  . Cancer of central portion of right female breast ([place] 10/25/2013  . Abdominal wall hernia 08/23/2013  . Edema of both legs 07/12/2013  .  Essential hypertension, benign 03/16/2013  . Adjustment disorder with mixed anxiety and depressed mood 03/16/2013  . Breast cancer of upper-outer quadrant of right female breast ([place]) 03/10/2013    SALISBUR[place],DONNA 09/22/2016, 12:33 PM  [place], [place], [address] Phone: [phone]   Fax:  [phone]  Name: SHATASHA LAMBING MRN: [id number] Date of Birth: 11-21-1950  Serafina Royals, PT 09/22/16 12:33 PM

## 2016-09-24 ENCOUNTER — Ambulatory Visit: Payer: Medicare HMO | Admitting: Physical Therapy

## 2016-09-24 DIAGNOSIS — M25611 Stiffness of right shoulder, not elsewhere classified: Secondary | ICD-10-CM | POA: Diagnosis not present

## 2016-09-24 DIAGNOSIS — M25511 Pain in right shoulder: Principal | ICD-10-CM

## 2016-09-24 DIAGNOSIS — G8929 Other chronic pain: Secondary | ICD-10-CM

## 2016-09-24 NOTE — Therapy (Signed)
[place], [place], [address] Phone: [phone]   Fax:  [phone]  Physical Therapy Treatment  Patient Details  Name: Denise Becker MRN: [id number] Date of Birth: March 29, 1951 Referring Provider: [name]  Encounter Date: 09/24/2016      PT End of Session - 09/24/16 1715    Visit Number 11   Number of Visits 17   Date for PT Re-Evaluation 10/10/16   PT Start Time 1024   PT Stop Time 1108   PT Time Calculation (min) 44 min   Activity Tolerance Patient tolerated treatment well   Behavior During Therapy [facility] for tasks assessed/performed      Past Medical History:  Diagnosis Date  . Anxiety    anxiousabout impending surgery- loss of breast  . Arthritis    "joints hurt q now and then" (10/25/2013)  . Breast cancer of upper-outer quadrant of right female breast ([place]) 03/10/2013  . Depression    history of depression after son died in the 2000's  . GERD (gastroesophageal reflux disease)    "just chemo related"  . H/O hiatal hernia   . Hypertension   . Radiation 12/08/13-01/17/14   Right chest wall 50.4 Gy  . Spontaneous dislocation of shoulder [phone]   "right; a few times over the years"    Past Surgical History:  Procedure Laterality Date  . ABDOMINAL HYSTERECTOMY  1990's  . BREAST BIOPSY Right 02/2013  . CESAREAN SECTION  1980  . MASTECTOMY COMPLETE / SIMPLE W/ SENTINEL NODE BIOPSY Right 10/25/2013  . MASTECTOMY W/ SENTINEL NODE BIOPSY Right 10/25/2013   Procedure: RIGHT TOTAL MASTECTOMY WITH SENTINEL LYMPH NODE BIOPSY;  Surgeon: Adin Hector, MD;  Location: [place];  Service: General;  Laterality: Right;  . MULTIPLE TOOTH EXTRACTIONS Bilateral 2011-2014 X 6  . PORT A CATH REVISION Right 04/29/2013   Procedure: PORT A CATH REVISION;  Surgeon: Adin Hector, MD;  Location: [place];  Service: General;  Laterality: Right;  . PORT-A-CATH REMOVAL  10/25/2013  .  PORT-A-CATH REMOVAL N/A 10/25/2013   Procedure: REMOVAL PORT-A-CATH;  Surgeon: Adin Hector, MD;  Location: [place];  Service: General;  Laterality: N/A;  . PORTACATH PLACEMENT Right 04/15/2013   Procedure: INSERTION PORT-A-CATH;  Surgeon: Adin Hector, MD;  Location: [place];  Service: General;  Laterality: Right;  . TONSILLECTOMY    . WISDOM TOOTH EXTRACTION      There were no vitals filed for this visit.      Subjective Assessment - 09/24/16 1025    Subjective Did the new exercise with the towel.  No questions or problems with that.   Currently in Pain? No/denies  just morningtime stiffness            [facility] PT Assessment - 09/24/16 0001      AROM   Right Shoulder Flexion 116 Degrees   Right Shoulder ABduction 100 Degrees  moves slowly to this point   Right Shoulder Internal Rotation 67 Degrees  in supine with slight forward roll of shoulder                     [facility] Adult PT Treatment/Exercise - 09/24/16 0001      Shoulder Exercises: Supine   Other Supine Exercises supine over towel roll, bilat. shoulder extension isometric along with scapular retraction, 3 counts x 10     Shoulder Exercises: Seated   Other Seated Exercises backward shoulder rolls, then  bilat. shoulder      Manual Therapy   Myofascial Release Rt. UE myofascial pulling with  movement into abduction;  two way stretch in vertical and diagonal at right chest; two way stretch with one hand at right upper arm and other at right lower flank   Scapular Mobilization in left sidelying for right scapula into protraction at various angles and into depression with prlonged stretch at inferior glide to stretch upper traps    Passive ROM to right shoulder in supine for ir, er, abduction, and flexion to patient's tolerance; supine over towel roll, manual pect minor stretch held x 60 seconds     Prosthetics   Edema                          Long Term Clinic Goals -  2016-10-20 1026      CC Long Term Goal  #1   Title Pt to demonstrate 140 degrees of right shoulder flexion to allow her to reach items overhead   Status On-going     CC Long Term Goal  #2   Title Pt to demonstrate 150 degrees of right shoulder abduction to allow her to reach items out to sides   Status On-going     CC Long Term Goal  #3   Title Pt to demonstrate 60 degrees of right shoulder IR to allow her to return to prior level of function   Status Achieved     CC Long Term Goal  #4   Title Pt to be independent in a home exercise program for continued strengthening and stretching   Status Partially Met     CC Long Term Goal  #5   Title Pt to report at least a 60% improvement in feeling of tightness when using her RUE to allow for improved function   Baseline "a great amount better--maybe halfway" as of 09/01/16, on 6/13 pt states this is a big improvement , but feel there could be more; on Oct 20, 2016, about halfway   Status Partially Met            Plan - 10-20-16 1715    Clinical Impression Statement Patient has made progress but has several goals not yet met.  She remains limited in shoulder ROM, though she seemed to tolerate stretching better today than at last visit.   Rehab Potential Good   Clinical Impairments Affecting Rehab Potential hx of dislocating R shoulder multiple times   PT Frequency 2x / week   PT Duration 4 weeks   PT Treatment/Interventions ADLs/Self Care Home Management;Electrical Stimulation;Iontophoresis 21m/ml Dexamethasone;Therapeutic exercise;Therapeutic activities;Patient/family education;Manual techniques;Scar mobilization;Passive range of motion;Taping   PT Next Visit Plan continue  myofascial/soft tissue mobilization work to right shoulder, posterior upper back and neck. consider dry needling later if no improvement    Consulted and Agree with Plan of Care Patient      Patient will benefit from skilled therapeutic intervention in order to improve the  following deficits and impairments:  Increased fascial restricitons, Pain, Decreased range of motion, Decreased strength, Impaired UE functional use, Postural dysfunction, Decreased scar mobility  Visit Diagnosis: Chronic right shoulder pain  Stiffness of right shoulder, not elsewhere classified       G-Codes - [id number]    Functional Assessment Tool Used (Outpatient Only) clinical judgement   Functional Limitation Carrying, moving and handling objects   Carrying, Moving and Handling Objects Current Status ((H6759 At least 1 percent but less than  20 percent impaired, limited or restricted   Carrying, Moving and Handling Objects Goal Status (Z9802) 0 percent impaired, limited or restricted      Problem List Patient Active Problem List   Diagnosis Date Noted  . Venous stasis 12/22/2013  . Bilateral lower leg cellulitis 11/11/2013  . Cancer of central portion of right female breast ([place]) 10/25/2013  . Abdominal wall hernia 08/23/2013  . Edema of both legs 07/12/2013  . Essential hypertension, benign 03/16/2013  . Adjustment disorder with mixed anxiety and depressed mood 03/16/2013  . Breast cancer of upper-outer quadrant of right female breast ([place]) 03/10/2013    Isobelle Tuckett 09/24/2016, 5:18 PM  [place], [place], [address] Phone: [phone]   Fax:  [phone]  Name: LETICIA COLETTA MRN: [id number] Date of Birth: Oct 20, 1950  Serafina Royals, PT 09/24/16 5:18 PM

## 2016-09-29 ENCOUNTER — Encounter: Payer: Self-pay | Admitting: Physical Therapy

## 2016-09-29 ENCOUNTER — Ambulatory Visit: Payer: Medicare HMO | Attending: Oncology | Admitting: Physical Therapy

## 2016-09-29 DIAGNOSIS — M6281 Muscle weakness (generalized): Secondary | ICD-10-CM | POA: Insufficient documentation

## 2016-09-29 DIAGNOSIS — M25611 Stiffness of right shoulder, not elsewhere classified: Secondary | ICD-10-CM | POA: Insufficient documentation

## 2016-09-29 DIAGNOSIS — G8929 Other chronic pain: Secondary | ICD-10-CM | POA: Insufficient documentation

## 2016-09-29 DIAGNOSIS — M25511 Pain in right shoulder: Secondary | ICD-10-CM | POA: Insufficient documentation

## 2016-09-29 NOTE — Therapy (Signed)
[place], [place], [address] Phone: [phone]   Fax:  [phone]  Physical Therapy Treatment  Patient Details  Name: Denise Becker MRN: [id number] Date of Birth: [birth year] Referring Provider: [name]  Encounter Date: 09/29/2016      PT End of Session - 09/29/16 1443    Visit Number 12   Number of Visits 17   Date for PT Re-Evaluation 10/10/16   PT Start Time 1301   PT Stop Time 1344   PT Time Calculation (min) 43 min   Activity Tolerance Patient tolerated treatment well   Behavior During Therapy [facility] for tasks assessed/performed      Past Medical History:  Diagnosis Date  . Anxiety    anxiousabout impending surgery- loss of breast  . Arthritis    "joints hurt q now and then" (10/25/2013)  . Breast cancer of upper-outer quadrant of right female breast ([place]) 03/10/2013  . Depression    history of depression after son died in the 2000's  . GERD (gastroesophageal reflux disease)    "just chemo related"  . H/O hiatal hernia   . Hypertension   . Radiation 12/08/13-01/17/14   Right chest wall 50.4 Gy  . Spontaneous dislocation of shoulder [phone]   "right; a few times over the years"    Past Surgical History:  Procedure Laterality Date  . ABDOMINAL HYSTERECTOMY  1990's  . BREAST BIOPSY Right 02/2013  . CESAREAN SECTION  1980  . MASTECTOMY COMPLETE / SIMPLE W/ SENTINEL NODE BIOPSY Right 10/25/2013  . MASTECTOMY W/ SENTINEL NODE BIOPSY Right 10/25/2013   Procedure: RIGHT TOTAL MASTECTOMY WITH SENTINEL LYMPH NODE BIOPSY;  Surgeon: Adin Hector, MD;  Location: [place];  Service: General;  Laterality: Right;  . MULTIPLE TOOTH EXTRACTIONS Bilateral 2011-2014 X 6  . PORT A CATH REVISION Right 04/29/2013   Procedure: PORT A CATH REVISION;  Surgeon: Adin Hector, MD;  Location: [place];  Service: General;  Laterality: Right;  . PORT-A-CATH REMOVAL  10/25/2013  .  PORT-A-CATH REMOVAL N/A 10/25/2013   Procedure: REMOVAL PORT-A-CATH;  Surgeon: Adin Hector, MD;  Location: [place];  Service: General;  Laterality: N/A;  . PORTACATH PLACEMENT Right 04/15/2013   Procedure: INSERTION PORT-A-CATH;  Surgeon: Adin Hector, MD;  Location: [place];  Service: General;  Laterality: Right;  . TONSILLECTOMY    . WISDOM TOOTH EXTRACTION      There were no vitals filed for this visit.      Subjective Assessment - 09/29/16 1303    Subjective I am okay. The exercises are coming along.    Pertinent History status post right total mastectomy and right axillary sentinel node biopsy 10/25/2013 for a residual ypT3 ypN0 invasive lobular carcinoma, again estrogen and progesterone receptor positive and HER-2 nonamplified; with negative margins, pt has completed radiation and chemotherapy,    Patient Stated Goals to be able to move the arm through full ROM   Currently in Pain? No/denies                         [facility] Adult PT Treatment/Exercise - 09/29/16 0001      Shoulder Exercises: Supine   Other Supine Exercises supine over towel roll, bilat. shoulder extension isometric along with scapular retraction, 3 counts x 10     Shoulder Exercises: Seated   Other Seated Exercises backward shoulder rolls, then bilat. shoulder  Shoulder Exercises: Standing   External Rotation AROM;Right;10 reps   Flexion AROM;Right;10 reps  while manually stabilizing shoulder   ABduction AROM;Right;10 reps  while stabilizing shoulder      Shoulder Exercises: Pulleys   Flexion 2 minutes   ABduction 2 minutes     Manual Therapy   Myofascial Release Rt. UE myofascial pulling with  movement into abduction; while providing a one handed stretch at right pec tendon   Scapular Mobilization in left sidelying for right scapula into protraction at various angles and into depression with prlonged stretch at inferior glide to stretch upper traps    Passive  ROM to right shoulder in supine for ir, er, abduction, and flexion to patient's tolerance                        Long Term Clinic Goals - 09/24/16 1026      CC Long Term Goal  #1   Title Pt to demonstrate 140 degrees of right shoulder flexion to allow her to reach items overhead   Status On-going     CC Long Term Goal  #2   Title Pt to demonstrate 150 degrees of right shoulder abduction to allow her to reach items out to sides   Status On-going     CC Long Term Goal  #3   Title Pt to demonstrate 60 degrees of right shoulder IR to allow her to return to prior level of function   Status Achieved     CC Long Term Goal  #4   Title Pt to be independent in a home exercise program for continued strengthening and stretching   Status Partially Met     CC Long Term Goal  #5   Title Pt to report at least a 60% improvement in feeling of tightness when using her RUE to allow for improved function   Baseline "a great amount better--maybe halfway" as of 09/01/16, on 6/13 pt states this is a big improvement , but feel there could be more; on 09/24/16, about halfway   Status Partially Met            Plan - 09/29/16 1444    Clinical Impression Statement Patient continues to have neck discomfort due to elevation in her right shoulder with UE overhead movement. Continued with scapular moblization to help restore normal glenohumeral rhythm. Pt tolerated PROM well today and felt stretching all the way across her chest. Next session focus on soft tissue mobilization to right neck and upper traps with pt in left sidelying.    Rehab Potential Good   Clinical Impairments Affecting Rehab Potential hx of dislocating R shoulder multiple times   PT Frequency 2x / week   PT Duration 4 weeks   PT Treatment/Interventions ADLs/Self Care Home Management;Electrical Stimulation;Iontophoresis 72m/ml Dexamethasone;Therapeutic exercise;Therapeutic activities;Patient/family education;Manual  techniques;Scar mobilization;Passive range of motion;Taping   PT Next Visit Plan continue  myofascial/soft tissue mobilization work to right shoulder, posterior upper back and neck. consider dry needling later if no improvement    Consulted and Agree with Plan of Care Patient      Patient will benefit from skilled therapeutic intervention in order to improve the following deficits and impairments:  Increased fascial restricitons, Pain, Decreased range of motion, Decreased strength, Impaired UE functional use, Postural dysfunction, Decreased scar mobility  Visit Diagnosis: Chronic right shoulder pain  Stiffness of right shoulder, not elsewhere classified  Muscle weakness (generalized)     Problem List Patient Active Problem  List   Diagnosis Date Noted  . Venous stasis 12/22/2013  . Bilateral lower leg cellulitis 11/11/2013  . Cancer of central portion of right female breast ([place]) 10/25/2013  . Abdominal wall hernia 08/23/2013  . Edema of both legs 07/12/2013  . Essential hypertension, benign 03/16/2013  . Adjustment disorder with mixed anxiety and depressed mood 03/16/2013  . Breast cancer of upper-outer quadrant of right female breast ([place]) 03/10/2013    Allyson Sabal [facility] 09/29/2016, 2:49 PM  [place], [place], [address] Phone: [phone]   Fax:  [phone]  Name: Denise Becker MRN: [id number] Date of Birth: [birth year]  Manus Gunning, PT 09/29/16 2:49 PM

## 2016-10-02 ENCOUNTER — Ambulatory Visit: Payer: Medicare HMO | Admitting: Physical Therapy

## 2016-10-02 DIAGNOSIS — M25611 Stiffness of right shoulder, not elsewhere classified: Secondary | ICD-10-CM

## 2016-10-02 DIAGNOSIS — M6281 Muscle weakness (generalized): Secondary | ICD-10-CM

## 2016-10-02 DIAGNOSIS — M25511 Pain in right shoulder: Principal | ICD-10-CM

## 2016-10-02 DIAGNOSIS — G8929 Other chronic pain: Secondary | ICD-10-CM

## 2016-10-02 NOTE — Therapy (Signed)
[place], [place], [address] Phone: [phone]   Fax:  [phone]  Physical Therapy Treatment  Patient Details  Name: Denise Becker MRN: [id number] Date of Birth: 1950/11/19 Referring Provider: [name]  Encounter Date: 10/02/2016      PT End of Session - 10/02/16 1735    Visit Number --  13   Number of Visits 17   Date for PT Re-Evaluation 10/10/16   PT Start Time 1300   PT Stop Time 1345   PT Time Calculation (min) 45 min   Activity Tolerance Patient tolerated treatment well   Behavior During Therapy [facility] for tasks assessed/performed      Past Medical History:  Diagnosis Date  . Anxiety    anxiousabout impending surgery- loss of breast  . Arthritis    "joints hurt q now and then" (10/25/2013)  . Breast cancer of upper-outer quadrant of right female breast ([place]) 03/10/2013  . Depression    history of depression after son died in the 2000's  . GERD (gastroesophageal reflux disease)    "just chemo related"  . H/O hiatal hernia   . Hypertension   . Radiation 12/08/13-01/17/14   Right chest wall 50.4 Gy  . Spontaneous dislocation of shoulder [phone]   "right; a few times over the years"    Past Surgical History:  Procedure Laterality Date  . ABDOMINAL HYSTERECTOMY  1990's  . BREAST BIOPSY Right 02/2013  . CESAREAN SECTION  1980  . MASTECTOMY COMPLETE / SIMPLE W/ SENTINEL NODE BIOPSY Right 10/25/2013  . MASTECTOMY W/ SENTINEL NODE BIOPSY Right 10/25/2013   Procedure: RIGHT TOTAL MASTECTOMY WITH SENTINEL LYMPH NODE BIOPSY;  Surgeon: Adin Hector, MD;  Location: [place];  Service: General;  Laterality: Right;  . MULTIPLE TOOTH EXTRACTIONS Bilateral 2011-2014 X 6  . PORT A CATH REVISION Right 04/29/2013   Procedure: PORT A CATH REVISION;  Surgeon: Adin Hector, MD;  Location: [place];  Service: General;  Laterality: Right;  . PORT-A-CATH REMOVAL  10/25/2013  .  PORT-A-CATH REMOVAL N/A 10/25/2013   Procedure: REMOVAL PORT-A-CATH;  Surgeon: Adin Hector, MD;  Location: [place];  Service: General;  Laterality: N/A;  . PORTACATH PLACEMENT Right 04/15/2013   Procedure: INSERTION PORT-A-CATH;  Surgeon: Adin Hector, MD;  Location: [place];  Service: General;  Laterality: Right;  . TONSILLECTOMY    . WISDOM TOOTH EXTRACTION      There were no vitals filed for this visit.      Subjective Assessment - 10/02/16 1306    Subjective Pt says she is waiting for insurane approval to have testing to find out why her stomach is swelling.  She is having pain in her neck and right shoulder    Pertinent History status post right total mastectomy and right axillary sentinel node biopsy 10/25/2013 for a residual ypT3 ypN0 invasive lobular carcinoma, again estrogen and progesterone receptor positive and HER-2 nonamplified; with negative margins, pt has completed radiation and chemotherapy,    Patient Stated Goals to be able to move the arm through full ROM   Currently in Pain? Yes   Pain Score 8    Pain Location Shoulder   Pain Orientation Right                         [facility] Adult PT Treatment/Exercise - 10/02/16 0001      Manual Therapy   Soft tissue mobilization  in sitting , to left upper back, neck, interscapular area and lateral trunk at lattissumus tendon    Myofascial Release Rt. UE myofascial pulling with  movement into abduction; while providing a one handed stretch at right pec tendon   Scapular Mobilization in left sidelying for right scapula into protraction at various angles and into depression with prlonged stretch at inferior glide to stretch upper traps    Passive ROM to right shoulder in supine for ir, er, abduction, and flexion to patient's tolerance                        Long Term Clinic Goals - 09/24/16 1026      CC Long Term Goal  #1   Title Pt to demonstrate 140 degrees of right  shoulder flexion to allow her to reach items overhead   Status On-going     CC Long Term Goal  #2   Title Pt to demonstrate 150 degrees of right shoulder abduction to allow her to reach items out to sides   Status On-going     CC Long Term Goal  #3   Title Pt to demonstrate 60 degrees of right shoulder IR to allow her to return to prior level of function   Status Achieved     CC Long Term Goal  #4   Title Pt to be independent in a home exercise program for continued strengthening and stretching   Status Partially Met     CC Long Term Goal  #5   Title Pt to report at least a 60% improvement in feeling of tightness when using her RUE to allow for improved function   Baseline "a great amount better--maybe halfway" as of 09/01/16, on 6/13 pt states this is a big improvement , but feel there could be more; on 09/24/16, about halfway   Status Partially Met            Plan - 10/02/16 1344    Clinical Impression Statement Pt states she feels better after treatment; her pain has decreased to about a 4/10.  Reinforced neck ROM and shoulder rolls. Pt does not want to try dry needling as she thinks it will hurt too much.    Rehab Potential Good   Clinical Impairments Affecting Rehab Potential hx of dislocating R shoulder multiple times   PT Frequency 2x / week   PT Duration 4 weeks   PT Next Visit Plan continue  myofascial/soft tissue mobilization work to right shoulder, posterior upper back and neck.    Consulted and Agree with Plan of Care Patient      Patient will benefit from skilled therapeutic intervention in order to improve the following deficits and impairments:     Visit Diagnosis: Chronic right shoulder pain  Stiffness of right shoulder, not elsewhere classified  Muscle weakness (generalized)     Problem List Patient Active Problem List   Diagnosis Date Noted  . Venous stasis 12/22/2013  . Bilateral lower leg cellulitis 11/11/2013  . Cancer of central portion of  right female breast ([place]) 10/25/2013  . Abdominal wall hernia 08/23/2013  . Edema of both legs 07/12/2013  . Essential hypertension, benign 03/16/2013  . Adjustment disorder with mixed anxiety and depressed mood 03/16/2013  . Breast cancer of upper-outer quadrant of right female breast ([place]) 03/10/2013   Donato Heinz. Owens Shark, PT  [facility] 10/02/2016, 5:37 PM  [place], [place],  [address] Phone: [phone]   Fax:  [phone]  Name: ADA HOLNESS MRN: [id number] Date of Birth: [birth year]

## 2016-10-07 ENCOUNTER — Encounter: Payer: Self-pay | Admitting: Physical Therapy

## 2016-10-07 ENCOUNTER — Ambulatory Visit: Payer: Medicare HMO | Admitting: Physical Therapy

## 2016-10-07 DIAGNOSIS — M6281 Muscle weakness (generalized): Secondary | ICD-10-CM

## 2016-10-07 DIAGNOSIS — M25511 Pain in right shoulder: Secondary | ICD-10-CM | POA: Diagnosis not present

## 2016-10-07 DIAGNOSIS — M25611 Stiffness of right shoulder, not elsewhere classified: Secondary | ICD-10-CM

## 2016-10-07 DIAGNOSIS — G8929 Other chronic pain: Secondary | ICD-10-CM

## 2016-10-07 NOTE — Therapy (Signed)
[place], [place], [address] Phone: [phone]   Fax:  [phone]  Physical Therapy Treatment  Patient Details  Name: Denise Becker MRN: [id number] Date of Birth: 28-Jul-1950 Referring Provider: [name]  Encounter Date: 10/07/2016      PT End of Session - 10/07/16 1151    Visit Number 14   Number of Visits 17   Date for PT Re-Evaluation 10/10/16   PT Start Time 1107   PT Stop Time 1149   PT Time Calculation (min) 42 min   Activity Tolerance Patient tolerated treatment well   Behavior During Therapy [facility] for tasks assessed/performed      Past Medical History:  Diagnosis Date  . Anxiety    anxiousabout impending surgery- loss of breast  . Arthritis    "joints hurt q now and then" (10/25/2013)  . Breast cancer of upper-outer quadrant of right female breast ([place]) 03/10/2013  . Depression    history of depression after son died in the 2000's  . GERD (gastroesophageal reflux disease)    "just chemo related"  . H/O hiatal hernia   . Hypertension   . Radiation 12/08/13-01/17/14   Right chest wall 50.4 Gy  . Spontaneous dislocation of shoulder [phone]   "right; a few times over the years"    Past Surgical History:  Procedure Laterality Date  . ABDOMINAL HYSTERECTOMY  1990's  . BREAST BIOPSY Right 02/2013  . CESAREAN SECTION  1980  . MASTECTOMY COMPLETE / SIMPLE W/ SENTINEL NODE BIOPSY Right 10/25/2013  . MASTECTOMY W/ SENTINEL NODE BIOPSY Right 10/25/2013   Procedure: RIGHT TOTAL MASTECTOMY WITH SENTINEL LYMPH NODE BIOPSY;  Surgeon: Adin Hector, MD;  Location: [place];  Service: General;  Laterality: Right;  . MULTIPLE TOOTH EXTRACTIONS Bilateral 2011-2014 X 6  . PORT A CATH REVISION Right 04/29/2013   Procedure: PORT A CATH REVISION;  Surgeon: Adin Hector, MD;  Location: [place];  Service: General;  Laterality: Right;  . PORT-A-CATH REMOVAL  10/25/2013  .  PORT-A-CATH REMOVAL N/A 10/25/2013   Procedure: REMOVAL PORT-A-CATH;  Surgeon: Adin Hector, MD;  Location: [place];  Service: General;  Laterality: N/A;  . PORTACATH PLACEMENT Right 04/15/2013   Procedure: INSERTION PORT-A-CATH;  Surgeon: Adin Hector, MD;  Location: [place];  Service: General;  Laterality: Right;  . TONSILLECTOMY    . WISDOM TOOTH EXTRACTION      There were no vitals filed for this visit.      Subjective Assessment - 10/07/16 1109    Subjective I will go see my doctor next week about my abdominal swelling. My pain is getting better. I am not having any pain right now.    Pertinent History status post right total mastectomy and right axillary sentinel node biopsy 10/25/2013 for a residual ypT3 ypN0 invasive lobular carcinoma, again estrogen and progesterone receptor positive and HER-2 nonamplified; with negative margins, pt has completed radiation and chemotherapy,    Patient Stated Goals to be able to move the arm through full ROM   Currently in Pain? No/denies   Pain Score 0-No pain                         [facility] Adult PT Treatment/Exercise - 10/07/16 0001      Shoulder Exercises: Standing   External Rotation Strengthening;Right;10 reps;Theraband   Theraband Level (Shoulder External Rotation) Level 1 (Yellow)   Internal Rotation Strengthening;Right;10  reps;Theraband  cues not to turn body   Theraband Level (Shoulder Internal Rotation) Level 1 (Yellow)   Flexion Strengthening;Right;10 reps;Theraband  with verbal cues to keep shoulder relaxed   Theraband Level (Shoulder Flexion) Level 1 (Yellow)   Extension Strengthening;Right;10 reps;Theraband   Theraband Level (Shoulder Extension) Level 1 (Yellow)   Other Standing Exercises 3 way shoulder with 1 lb weights in each hand with verbal cues not to elevate right shoulder   Other Standing Exercises shoulder retraction x 10 with yellow band, low rows x 10 with yellow band, bicep  curls with 3lb x 10 reps bilat,      Shoulder Exercises: Pulleys   Flexion 2 minutes   ABduction 2 minutes     Shoulder Exercises: Therapy Ball   Flexion 10 reps     Manual Therapy   Passive ROM to right shoulder in supine for ir, er, abduction, and flexion to patient's tolerance                        Long Term Clinic Goals - 09/24/16 1026      CC Long Term Goal  #1   Title Pt to demonstrate 140 degrees of right shoulder flexion to allow her to reach items overhead   Status On-going     CC Long Term Goal  #2   Title Pt to demonstrate 150 degrees of right shoulder abduction to allow her to reach items out to sides   Status On-going     CC Long Term Goal  #3   Title Pt to demonstrate 60 degrees of right shoulder IR to allow her to return to prior level of function   Status Achieved     CC Long Term Goal  #4   Title Pt to be independent in a home exercise program for continued strengthening and stretching   Status Partially Met     CC Long Term Goal  #5   Title Pt to report at least a 60% improvement in feeling of tightness when using her RUE to allow for improved function   Baseline "a great amount better--maybe halfway" as of 09/01/16, on 6/13 pt states this is a big improvement , but feel there could be more; on 09/24/16, about halfway   Status Partially Met            Plan - 10/07/16 1152    Clinical Impression Statement Patient states she is not having any shoulder or neck pain today. Pt eager to learn strengthening exercises with weights that she can do at home. Instructed pt in 3 way shoulder and bicep curls today and added additonal resistance band exercises. Next session if pt continues to have decreased pain will instruct in Strength ABC program. She is still unsure if she wants to pursue dry needling at this time.    Rehab Potential Good   Clinical Impairments Affecting Rehab Potential hx of dislocating R shoulder multiple times   PT Frequency 2x /  week   PT Duration 4 weeks   PT Treatment/Interventions ADLs/Self Care Home Management;Electrical Stimulation;Iontophoresis 47m/ml Dexamethasone;Therapeutic exercise;Therapeutic activities;Patient/family education;Manual techniques;Scar mobilization;Passive range of motion;Taping   PT Next Visit Plan if no pain then instruct in Strength ABC, continue  myofascial/soft tissue mobilization work to right shoulder, posterior upper back and neck.    Consulted and Agree with Plan of Care Patient      Patient will benefit from skilled therapeutic intervention in order to improve the following deficits and  impairments:  Increased fascial restricitons, Pain, Decreased range of motion, Decreased strength, Impaired UE functional use, Postural dysfunction, Decreased scar mobility  Visit Diagnosis: Chronic right shoulder pain  Stiffness of right shoulder, not elsewhere classified  Muscle weakness (generalized)     Problem List Patient Active Problem List   Diagnosis Date Noted  . Venous stasis 12/22/2013  . Bilateral lower leg cellulitis 11/11/2013  . Cancer of central portion of right female breast ([place]) 10/25/2013  . Abdominal wall hernia 08/23/2013  . Edema of both legs 07/12/2013  . Essential hypertension, benign 03/16/2013  . Adjustment disorder with mixed anxiety and depressed mood 03/16/2013  . Breast cancer of upper-outer quadrant of right female breast ([place]) 03/10/2013    Allyson Sabal [facility] 10/07/2016, 11:54 AM  [place], [place], [address] Phone: [phone]   Fax:  [phone]  Name: Denise Becker MRN: [id number] Date of Birth: Mar 06, 1951  Manus Gunning, PT 10/07/16 11:55 AM

## 2016-10-09 ENCOUNTER — Encounter: Payer: Self-pay | Admitting: Physical Therapy

## 2016-10-09 ENCOUNTER — Ambulatory Visit: Payer: Medicare HMO | Admitting: Physical Therapy

## 2016-10-09 DIAGNOSIS — G8929 Other chronic pain: Secondary | ICD-10-CM

## 2016-10-09 DIAGNOSIS — M6281 Muscle weakness (generalized): Secondary | ICD-10-CM

## 2016-10-09 DIAGNOSIS — M25511 Pain in right shoulder: Secondary | ICD-10-CM | POA: Diagnosis not present

## 2016-10-09 DIAGNOSIS — M25611 Stiffness of right shoulder, not elsewhere classified: Secondary | ICD-10-CM

## 2016-10-09 NOTE — Patient Instructions (Signed)
(  Home) Flexion: Pelvic Tilt    Lie with neck supported, knees bent, feet flat. Tighten and suck stomach in, pushing back down against surface. Do not push down with legs. Hold 3 seconds. Repeat _10___ times per set. Do _1___ sets per session. Do __7__ sessions per week.  Copyright  VHI. All rights reserved.

## 2016-10-09 NOTE — Therapy (Addendum)
[place], [place], [address] Phone: [phone]   Fax:  [phone]  Physical Therapy Treatment  Patient Details  Name: Denise Becker MRN: [id number] Date of Birth: 06/06/1950 Referring Provider: [name]  Encounter Date: 10/09/2016      PT End of Session - 10/09/16 1623    Visit Number 15   Number of Visits 17   Date for PT Re-Evaluation 10/10/16   PT Start Time 1307   PT Stop Time 1343   PT Time Calculation (min) 36 min   Activity Tolerance Patient tolerated treatment well   Behavior During Therapy [facility] for tasks assessed/performed      Past Medical History:  Diagnosis Date  . Anxiety    anxiousabout impending surgery- loss of breast  . Arthritis    "joints hurt q now and then" (10/25/2013)  . Breast cancer of upper-outer quadrant of right female breast ([place]) 03/10/2013  . Depression    history of depression after son died in the 2000's  . GERD (gastroesophageal reflux disease)    "just chemo related"  . H/O hiatal hernia   . Hypertension   . Radiation 12/08/13-01/17/14   Right chest wall 50.4 Gy  . Spontaneous dislocation of shoulder [phone]   "right; a few times over the years"    Past Surgical History:  Procedure Laterality Date  . ABDOMINAL HYSTERECTOMY  1990's  . BREAST BIOPSY Right 02/2013  . CESAREAN SECTION  1980  . MASTECTOMY COMPLETE / SIMPLE W/ SENTINEL NODE BIOPSY Right 10/25/2013  . MASTECTOMY W/ SENTINEL NODE BIOPSY Right 10/25/2013   Procedure: RIGHT TOTAL MASTECTOMY WITH SENTINEL LYMPH NODE BIOPSY;  Surgeon: Adin Hector, MD;  Location: [place];  Service: General;  Laterality: Right;  . MULTIPLE TOOTH EXTRACTIONS Bilateral 2011-2014 X 6  . PORT A CATH REVISION Right 04/29/2013   Procedure: PORT A CATH REVISION;  Surgeon: Adin Hector, MD;  Location: [place];  Service: General;  Laterality: Right;  . PORT-A-CATH REMOVAL  10/25/2013  .  PORT-A-CATH REMOVAL N/A 10/25/2013   Procedure: REMOVAL PORT-A-CATH;  Surgeon: Adin Hector, MD;  Location: [place];  Service: General;  Laterality: N/A;  . PORTACATH PLACEMENT Right 04/15/2013   Procedure: INSERTION PORT-A-CATH;  Surgeon: Adin Hector, MD;  Location: [place];  Service: General;  Laterality: Right;  . TONSILLECTOMY    . WISDOM TOOTH EXTRACTION      There were no vitals filed for this visit.      Subjective Assessment - 10/09/16 1311    Subjective I feel a little nauseated and tired. I think it is the Pepsi I drank.    Pertinent History status post right total mastectomy and right axillary sentinel node biopsy 10/25/2013 for a residual ypT3 ypN0 invasive lobular carcinoma, again estrogen and progesterone receptor positive and HER-2 nonamplified; with negative margins, pt has completed radiation and chemotherapy,    Patient Stated Goals to be able to move the arm through full ROM   Currently in Pain? No/denies   Pain Score 0-No pain            [facility] PT Assessment - 10/09/16 0001      AROM   Right Shoulder Flexion 120 Degrees   Right Shoulder ABduction 95 Degrees                     [facility] Adult PT Treatment/Exercise - 10/09/16 0001      Shoulder  Exercises: Standing   Other Standing Exercises Instructed pt in Strength After Breast Cancer program, held stretches for 15 sec, performed all exercises x 10 with 1 lb weights, did pelvic tilts instead of ab curls and held off on Superman and Steps secondary to pt's nausea                        Long Term Clinic Goals - 09/24/16 1026      CC Long Term Goal  #1   Title Pt to demonstrate 140 degrees of right shoulder flexion to allow her to reach items overhead   Status On-going     CC Long Term Goal  #2   Title Pt to demonstrate 150 degrees of right shoulder abduction to allow her to reach items out to sides   Status On-going     CC Long Term Goal  #3   Title Pt  to demonstrate 60 degrees of right shoulder IR to allow her to return to prior level of function   Status Achieved     CC Long Term Goal  #4   Title Pt to be independent in a home exercise program for continued strengthening and stretching   Status Partially Met     CC Long Term Goal  #5   Title Pt to report at least a 60% improvement in feeling of tightness when using her RUE to allow for improved function   Baseline "a great amount better--maybe halfway" as of 09/01/16, on 6/13 pt states this is a big improvement , but feel there could be more; on 09/24/16, about halfway   Status Partially Met            Plan - 10/09/16 1323    Clinical Impression Statement Instructed pt in stretches and exercises in Strength After Breast Cancer Program and issued it to pt as part of her home exercise program. Pt will be due for recertification. Re measured her shoulder ROM and it has changed minimally over the last two weeks. Once pt is independent with a home exercise program she will be ready for discharge.    Rehab Potential Good   Clinical Impairments Affecting Rehab Potential hx of dislocating R shoulder multiple times   PT Frequency 2x / week   PT Duration 4 weeks   PT Treatment/Interventions ADLs/Self Care Home Management;Electrical Stimulation;Iontophoresis 6m/ml Dexamethasone;Therapeutic exercise;Therapeutic activities;Patient/family education;Manual techniques;Scar mobilization;Passive range of motion;Taping   PT Next Visit Plan re cert or d/c if pt is ready, continue instructing in Strength ABC program, assess indep, add superwoman and steps   Consulted and Agree with Plan of Care Patient      Patient will benefit from skilled therapeutic intervention in order to improve the following deficits and impairments:  Increased fascial restricitons, Pain, Decreased range of motion, Decreased strength, Impaired UE functional use, Postural dysfunction, Decreased scar mobility  Visit  Diagnosis: Chronic right shoulder pain  Stiffness of right shoulder, not elsewhere classified  Muscle weakness (generalized)     Problem List Patient Active Problem List   Diagnosis Date Noted  . Venous stasis 12/22/2013  . Bilateral lower leg cellulitis 11/11/2013  . Cancer of central portion of right female breast ([place] 10/25/2013  . Abdominal wall hernia 08/23/2013  . Edema of both legs 07/12/2013  . Essential hypertension, benign 03/16/2013  . Adjustment disorder with mixed anxiety and depressed mood 03/16/2013  . Breast cancer of [place]outer quadrant of [place] ([place] 03/10/2013  Allyson Sabal [facility] 10/09/2016, 4:24 PM  [place], [place], [address] Phone: [phone]   Fax:  [phone]  Name: PARRISH DADDARIO MRN: [id number] Date of Birth: 10-Aug-1950  Manus Gunning, PT 10/09/16 4:24 PM  PHYSICAL THERAPY DISCHARGE SUMMARY  Visits from Start of Care: 5 Current functional level related to goals / functional outcomes: See above   Remaining deficits: See above   Education / Equipment: See above Plan: Patient agrees to discharge.  Patient goals were not met. Patient is being discharged due to not returning since the last visit.  ?????     Allyson Sabal [place], [place] 04/27/17 10:27 AM

## 2016-10-21 ENCOUNTER — Ambulatory Visit: Payer: Medicare HMO | Admitting: Physical Therapy

## 2016-10-23 ENCOUNTER — Encounter: Payer: Medicare HMO | Admitting: Physical Therapy

## 2016-10-28 ENCOUNTER — Ambulatory Visit: Payer: Medicare HMO | Admitting: Physical Therapy

## 2016-10-30 ENCOUNTER — Encounter: Payer: Medicare HMO | Admitting: Physical Therapy

## 2017-06-29 ENCOUNTER — Other Ambulatory Visit: Payer: Self-pay | Admitting: Oncology

## 2017-06-29 DIAGNOSIS — Z1231 Encounter for screening mammogram for malignant neoplasm of breast: Secondary | ICD-10-CM

## 2017-07-17 ENCOUNTER — Ambulatory Visit: Payer: Medicare HMO

## 2017-08-05 ENCOUNTER — Encounter: Payer: Self-pay | Admitting: Oncology

## 2017-08-05 ENCOUNTER — Inpatient Hospital Stay: Payer: Medicare HMO

## 2017-08-05 ENCOUNTER — Inpatient Hospital Stay: Payer: Medicare HMO | Attending: Oncology | Admitting: Oncology

## 2017-08-05 DIAGNOSIS — C50111 Malignant neoplasm of central portion of right female breast: Secondary | ICD-10-CM

## 2017-08-05 DIAGNOSIS — C50411 Malignant neoplasm of upper-outer quadrant of right female breast: Secondary | ICD-10-CM

## 2017-08-05 DIAGNOSIS — Z170 Estrogen receptor positive status [ER+]: Secondary | ICD-10-CM

## 2017-08-05 NOTE — Progress Notes (Signed)
No show

## 2017-08-07 ENCOUNTER — Ambulatory Visit
Admission: RE | Admit: 2017-08-07 | Discharge: 2017-08-07 | Disposition: A | Discharge status: Home / Self Care | Payer: Medicare HMO | Source: Ambulatory Visit | Attending: Oncology | Admitting: Oncology

## 2017-08-07 DIAGNOSIS — Z1231 Encounter for screening mammogram for malignant neoplasm of breast: Secondary | ICD-10-CM

## 2017-10-08 ENCOUNTER — Emergency Department (HOSPITAL_COMMUNITY)
Admission: EM | Admit: 2017-10-08 | Discharge: 2017-10-09 | Disposition: A | Discharge status: Home / Self Care | Payer: Medicare HMO | Attending: Emergency Medicine | Admitting: Emergency Medicine

## 2017-10-08 ENCOUNTER — Encounter (HOSPITAL_COMMUNITY): Payer: Self-pay | Admitting: Emergency Medicine

## 2017-10-08 ENCOUNTER — Other Ambulatory Visit: Payer: Self-pay

## 2017-10-08 DIAGNOSIS — S40022A Contusion of left upper arm, initial encounter: Secondary | ICD-10-CM | POA: Insufficient documentation

## 2017-10-08 DIAGNOSIS — F329 Major depressive disorder, single episode, unspecified: Secondary | ICD-10-CM | POA: Diagnosis not present

## 2017-10-08 DIAGNOSIS — S4992XA Unspecified injury of left shoulder and upper arm, initial encounter: Secondary | ICD-10-CM | POA: Diagnosis present

## 2017-10-08 DIAGNOSIS — Y92481 Parking lot as the place of occurrence of the external cause: Secondary | ICD-10-CM | POA: Insufficient documentation

## 2017-10-08 DIAGNOSIS — Y998 Other external cause status: Secondary | ICD-10-CM | POA: Diagnosis not present

## 2017-10-08 DIAGNOSIS — Z79899 Other long term (current) drug therapy: Secondary | ICD-10-CM | POA: Insufficient documentation

## 2017-10-08 DIAGNOSIS — Z853 Personal history of malignant neoplasm of breast: Secondary | ICD-10-CM | POA: Diagnosis not present

## 2017-10-08 DIAGNOSIS — F4323 Adjustment disorder with mixed anxiety and depressed mood: Secondary | ICD-10-CM | POA: Insufficient documentation

## 2017-10-08 DIAGNOSIS — S5012XA Contusion of left forearm, initial encounter: Secondary | ICD-10-CM | POA: Diagnosis not present

## 2017-10-08 DIAGNOSIS — F419 Anxiety disorder, unspecified: Secondary | ICD-10-CM | POA: Insufficient documentation

## 2017-10-08 DIAGNOSIS — Z87891 Personal history of nicotine dependence: Secondary | ICD-10-CM | POA: Diagnosis not present

## 2017-10-08 DIAGNOSIS — Y9389 Activity, other specified: Secondary | ICD-10-CM | POA: Insufficient documentation

## 2017-10-08 DIAGNOSIS — I10 Essential (primary) hypertension: Secondary | ICD-10-CM | POA: Diagnosis not present
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
## 2017-10-09 ENCOUNTER — Emergency Department (HOSPITAL_COMMUNITY): Payer: Medicare HMO

## 2017-10-09 MED ORDER — NAPROXEN 375 MG PO TABS: 375.0000 mg | ORAL_TABLET | Freq: Two times a day (BID) | ORAL | 0 refills | Status: DC

## 2017-10-09 MED ORDER — IBUPROFEN 400 MG PO TABS
400.0000 mg | ORAL_TABLET | Freq: Once | ORAL | Status: AC
  Administered 2017-10-09: 400 mg via ORAL
  Filled 2017-10-09: qty 1
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
## 2017-10-09 NOTE — Discharge Instructions (Signed)
Apply ice several times a day, as needed. Take acetaminophen as needed for additional pain relief.

## 2017-10-14 ENCOUNTER — Telehealth: Payer: Self-pay | Admitting: Oncology

## 2017-10-14 NOTE — Telephone Encounter (Signed)
Called regarding 8/12

## 2017-10-20 ENCOUNTER — Other Ambulatory Visit: Payer: Self-pay | Admitting: Family Medicine

## 2017-10-20 ENCOUNTER — Ambulatory Visit
Admission: RE | Admit: 2017-10-20 | Discharge: 2017-10-20 | Disposition: A | Discharge status: Home / Self Care | Payer: Medicare HMO | Source: Ambulatory Visit | Attending: Family Medicine | Admitting: Family Medicine

## 2017-10-20 DIAGNOSIS — M25619 Stiffness of unspecified shoulder, not elsewhere classified: Secondary | ICD-10-CM

## 2017-10-20 DIAGNOSIS — M25511 Pain in right shoulder: Secondary | ICD-10-CM
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
## 2017-11-09 ENCOUNTER — Inpatient Hospital Stay: Payer: Medicare HMO | Attending: Oncology

## 2017-11-09 ENCOUNTER — Telehealth: Payer: Self-pay | Admitting: Oncology

## 2017-11-09 ENCOUNTER — Inpatient Hospital Stay (HOSPITAL_BASED_OUTPATIENT_CLINIC_OR_DEPARTMENT_OTHER): Payer: Medicare HMO | Admitting: Oncology

## 2017-11-09 VITALS — BP 168/67 | HR 50 | Temp 98.4°F | Resp 18 | Ht 65.0 in | Wt 202.0 lb

## 2017-11-09 DIAGNOSIS — C50111 Malignant neoplasm of central portion of right female breast: Secondary | ICD-10-CM | POA: Diagnosis present

## 2017-11-09 DIAGNOSIS — Z9221 Personal history of antineoplastic chemotherapy: Secondary | ICD-10-CM

## 2017-11-09 DIAGNOSIS — Z79811 Long term (current) use of aromatase inhibitors: Secondary | ICD-10-CM | POA: Insufficient documentation

## 2017-11-09 DIAGNOSIS — Z923 Personal history of irradiation: Secondary | ICD-10-CM | POA: Diagnosis not present

## 2017-11-09 DIAGNOSIS — Z9011 Acquired absence of right breast and nipple: Secondary | ICD-10-CM | POA: Insufficient documentation

## 2017-11-09 DIAGNOSIS — Z79810 Long term (current) use of selective estrogen receptor modulators (SERMs): Secondary | ICD-10-CM

## 2017-11-09 DIAGNOSIS — Z170 Estrogen receptor positive status [ER+]: Secondary | ICD-10-CM | POA: Insufficient documentation

## 2017-11-09 DIAGNOSIS — C50411 Malignant neoplasm of upper-outer quadrant of right female breast: Secondary | ICD-10-CM

## 2017-11-09 LAB — CBC WITH DIFFERENTIAL/PLATELET
Basophils Absolute: 0.1 10*3/uL (ref 0.0–0.1)
Basophils Relative: 1 %
EOS PCT: 3 %
Eosinophils Absolute: 0.2 10*3/uL (ref 0.0–0.5)
HCT: 44.1 % (ref 34.8–46.6)
Hemoglobin: 14.4 g/dL (ref 11.6–15.9)
LYMPHS PCT: 33 %
Lymphs Abs: 2.2 10*3/uL (ref 0.9–3.3)
MCH: 27.8 pg (ref 25.1–34.0)
MCHC: 32.8 g/dL (ref 31.5–36.0)
MCV: 84.9 fL (ref 79.5–101.0)
MONO ABS: 0.7 10*3/uL (ref 0.1–0.9)
MONOS PCT: 10 %
Neutro Abs: 3.6 10*3/uL (ref 1.5–6.5)
Neutrophils Relative %: 53 %
PLATELETS: 229 10*3/uL (ref 145–400)
RBC: 5.19 MIL/uL (ref 3.70–5.45)
RDW: 14.6 % — AB (ref 11.2–14.5)
WBC: 6.7 10*3/uL (ref 3.9–10.3)

## 2017-11-09 LAB — COMPREHENSIVE METABOLIC PANEL
ALT: 14 U/L (ref 0–44)
AST: 13 U/L — ABNORMAL LOW (ref 15–41)
Albumin: 3.7 g/dL (ref 3.5–5.0)
Alkaline Phosphatase: 78 U/L (ref 38–126)
Anion gap: 10 (ref 5–15)
BUN: 14 mg/dL (ref 8–23)
CALCIUM: 9.2 mg/dL (ref 8.9–10.3)
CO2: 24 mmol/L (ref 22–32)
Chloride: 106 mmol/L (ref 98–111)
Creatinine, Ser: 0.81 mg/dL (ref 0.44–1.00)
GFR calc Af Amer: >60 mL/min (ref >60)
GLUCOSE: 85 mg/dL (ref 70–99)
POTASSIUM: 3.4 mmol/L — AB (ref 3.5–5.1)
SODIUM: 140 mmol/L (ref 135–145)
Total Bilirubin: 0.4 mg/dL (ref 0.3–1.2)
Total Protein: 8 g/dL (ref 6.5–8.1)

## 2017-11-09 MED ORDER — TAMOXIFEN CITRATE 20 MG PO TABS: 20.0000 mg | ORAL_TABLET | Freq: Every day | ORAL | 12 refills | Status: AC

## 2017-11-09 NOTE — Telephone Encounter (Signed)
Gave patient avs report and appointments for August 2020. Referral to [facility] sent via [facility]. Office will contact patient -patient aware.

## 2018-04-01 ENCOUNTER — Telehealth: Payer: Self-pay

## 2018-04-01 MED ORDER — TAMOXIFEN CITRATE 20 MG PO TABS: 20.0000 mg | ORAL_TABLET | Freq: Every day | ORAL | 3 refills | Status: DC

## 2018-04-01 NOTE — Telephone Encounter (Signed)
Pt called requesting tamoxifen refill to be sent to [facility] today.told pt that we will send it over for her. Pt thankful for the call.

## 2018-04-05 ENCOUNTER — Other Ambulatory Visit: Payer: Self-pay | Admitting: *Deleted

## 2018-04-05 MED ORDER — TAMOXIFEN CITRATE 20 MG PO TABS: 20.0000 mg | ORAL_TABLET | Freq: Every day | ORAL | 3 refills | Status: DC

## 2018-05-19 ENCOUNTER — Ambulatory Visit (INDEPENDENT_AMBULATORY_CARE_PROVIDER_SITE_OTHER): Payer: Medicare HMO

## 2018-05-19 ENCOUNTER — Encounter: Payer: Self-pay | Admitting: Podiatry

## 2018-05-19 ENCOUNTER — Other Ambulatory Visit: Payer: Self-pay | Admitting: Podiatry

## 2018-05-19 ENCOUNTER — Ambulatory Visit: Payer: Medicare HMO | Admitting: Podiatry

## 2018-05-19 VITALS — BP 158/80

## 2018-05-19 DIAGNOSIS — M79671 Pain in right foot: Secondary | ICD-10-CM | POA: Diagnosis not present

## 2018-05-19 DIAGNOSIS — B070 Plantar wart: Secondary | ICD-10-CM

## 2018-05-19 DIAGNOSIS — M779 Enthesopathy, unspecified: Secondary | ICD-10-CM

## 2018-05-24 NOTE — Progress Notes (Signed)
Subjective:   Patient ID: Denise Becker, female   DOB: 68 y.o.   MRN: [id number]   HPI Patient presents with chronic lesion underneath the right foot which is been very sore and making it hard to walk.  States is been present for a while and has had history of warts and feels like it is the same thing.  Patient does not smoke and likes to be active   Review of Systems  All other systems reviewed and are negative.       Objective:  Physical Exam Vitals signs and nursing note reviewed.  Constitutional:      Appearance: She is well-developed.  Pulmonary:     Effort: Pulmonary effort is normal.  Musculoskeletal: Normal range of motion.  Skin:    General: Skin is warm.  Neurological:     Mental Status: She is alert.     Neurovascular status intact muscle strength is adequate range of motion within normal limits with patient found to have a painful keratotic lesion sub-third metatarsal right third that is irritated and hard for the patient to wear shoe gear with.  Upon debridement I did no pinpoint bleeding it is painful to lateral pressure it appears to be related to the bone structure     Assessment:  Chronic lesion submetatarsal right painful when pressed with possibility for structural involvement or possibility that this is strictly related to soft tissue lesion     Plan:  H&P condition reviewed and sharp sterile debridement accomplished.  Tolerated this well and I went ahead and applied chemical agent to create immune response along with sterile dressing and instructed on padding therapy and what to do if any blistering were to occur.  Patient will be seen back to recheck  X-ray did not indicate enlargement of the bone structure or signs of pathology from that standpoint

## 2018-08-02 ENCOUNTER — Other Ambulatory Visit: Payer: Self-pay | Admitting: Oncology

## 2018-08-02 DIAGNOSIS — Z1231 Encounter for screening mammogram for malignant neoplasm of breast: Secondary | ICD-10-CM

## 2018-09-28 ENCOUNTER — Other Ambulatory Visit: Payer: Self-pay

## 2018-09-28 ENCOUNTER — Ambulatory Visit
Admission: RE | Admit: 2018-09-28 | Discharge: 2018-09-28 | Disposition: A | Discharge status: Home / Self Care | Payer: Medicare HMO | Source: Ambulatory Visit | Attending: Oncology | Admitting: Oncology

## 2018-09-28 DIAGNOSIS — Z1231 Encounter for screening mammogram for malignant neoplasm of breast: Secondary | ICD-10-CM

## 2018-11-08 ENCOUNTER — Inpatient Hospital Stay: Payer: Medicare HMO

## 2018-11-08 ENCOUNTER — Other Ambulatory Visit: Payer: Self-pay

## 2018-11-08 ENCOUNTER — Inpatient Hospital Stay: Payer: Medicare HMO | Attending: Oncology | Admitting: Oncology

## 2018-11-08 VITALS — BP 199/95 | HR 57 | Temp 98.2°F | Resp 19 | Wt 206.5 lb

## 2018-11-08 DIAGNOSIS — C50111 Malignant neoplasm of central portion of right female breast: Secondary | ICD-10-CM | POA: Insufficient documentation

## 2018-11-08 DIAGNOSIS — C50411 Malignant neoplasm of upper-outer quadrant of right female breast: Secondary | ICD-10-CM

## 2018-11-08 DIAGNOSIS — Z170 Estrogen receptor positive status [ER+]: Secondary | ICD-10-CM | POA: Diagnosis not present

## 2018-11-08 DIAGNOSIS — Z9011 Acquired absence of right breast and nipple: Secondary | ICD-10-CM | POA: Insufficient documentation

## 2018-11-08 DIAGNOSIS — Z79810 Long term (current) use of selective estrogen receptor modulators (SERMs): Secondary | ICD-10-CM | POA: Diagnosis not present

## 2018-11-08 DIAGNOSIS — Z9221 Personal history of antineoplastic chemotherapy: Secondary | ICD-10-CM | POA: Insufficient documentation

## 2018-11-08 DIAGNOSIS — Z923 Personal history of irradiation: Secondary | ICD-10-CM | POA: Insufficient documentation

## 2018-11-08 DIAGNOSIS — K439 Ventral hernia without obstruction or gangrene: Secondary | ICD-10-CM | POA: Insufficient documentation

## 2018-11-08 LAB — COMPREHENSIVE METABOLIC PANEL
ALT: 32 U/L (ref 0–44)
AST: 16 U/L (ref 15–41)
Albumin: 3.4 g/dL — ABNORMAL LOW (ref 3.5–5.0)
Alkaline Phosphatase: 75 U/L (ref 38–126)
Anion gap: 8 (ref 5–15)
BUN: 15 mg/dL (ref 8–23)
CO2: 27 mmol/L (ref 22–32)
Calcium: 9.4 mg/dL (ref 8.9–10.3)
Chloride: 107 mmol/L (ref 98–111)
Creatinine, Ser: 1.01 mg/dL — ABNORMAL HIGH (ref 0.44–1.00)
GFR calc Af Amer: >60 mL/min (ref >60)
GFR calc non Af Amer: 57 mL/min — ABNORMAL LOW (ref >60)
Glucose, Bld: 89 mg/dL (ref 70–99)
Potassium: 3.2 mmol/L — ABNORMAL LOW (ref 3.5–5.1)
Sodium: 142 mmol/L (ref 135–145)
Total Bilirubin: 0.4 mg/dL (ref 0.3–1.2)
Total Protein: 7.6 g/dL (ref 6.5–8.1)

## 2018-11-08 LAB — CBC WITH DIFFERENTIAL/PLATELET
Abs Immature Granulocytes: 0.01 10*3/uL (ref 0.00–0.07)
Basophils Absolute: 0.1 10*3/uL (ref 0.0–0.1)
Basophils Relative: 1 %
Eosinophils Absolute: 0.2 10*3/uL (ref 0.0–0.5)
Eosinophils Relative: 3 %
HCT: 44.1 % (ref 36.0–46.0)
Hemoglobin: 14.1 g/dL (ref 12.0–15.0)
Immature Granulocytes: 0 %
Lymphocytes Relative: 46 %
Lymphs Abs: 2.9 10*3/uL (ref 0.7–4.0)
MCH: 27.5 pg (ref 26.0–34.0)
MCHC: 32 g/dL (ref 30.0–36.0)
MCV: 86.1 fL (ref 80.0–100.0)
Monocytes Absolute: 0.8 10*3/uL (ref 0.1–1.0)
Monocytes Relative: 13 %
Neutro Abs: 2.3 10*3/uL (ref 1.7–7.7)
Neutrophils Relative %: 37 %
Platelets: 267 10*3/uL (ref 150–400)
RBC: 5.12 MIL/uL — ABNORMAL HIGH (ref 3.87–5.11)
RDW: 14.5 % (ref 11.5–15.5)
WBC: 6.2 10*3/uL (ref 4.0–10.5)
nRBC: 0 % (ref 0.0–0.2)

## 2018-11-08 NOTE — Progress Notes (Signed)
Denise Becker  [phone] Fax:([phone]    ID: EARLY ORD OB: 06-Mar-1951  MR#: [id number]  QDU#:[id number]  Patient Care Team: Elliot Gurney as PCP - General Lahoma Crocker, MD as Consulting Physician (Obstetrics and Gynecology) Lin Landsman, MD as Consulting Physician (Family Medicine) Carol Ada, MD as Consulting Physician (Gastroenterology) [name], Virgie Dad, MD as Consulting Physician (Oncology) Fanny Skates, MD as Consulting Physician (General Surgery) Thea Silversmith, MD as Referring Physician (Radiation Oncology) GYN:  Lahoma Crocker, MD SU: Fanny Skates, MD OTHER MD: Thea Silversmith, MD   CHIEF COMPLAINT: Right breast cancer  CURRENT TREATMENT: Tamoxifen   BREAST CANCER HISTORY: From the initial intake note 03/16/2013:  Denise Becker palpated a change in her right breast sometime in August or September 2014. She was aware that this might be significant, but waited on meds until she saw her gynecologist for a routine visit. Dr. Delsa Sale immediately he set her up for right mammography and ultrasonography performed 03/03/2013. Mammography found an area of density in the upper outer quadrant of the right breast measuring 5 cm in the with right nipple retraction. There was no evidence of skin thickening. On exam there was a firm palpable mass in the superior right breast extending from 1:00 to 9:00. The right nipple was completely retracted. There was no skin thickening. The right axilla was benign by palpation. Ultrasound showed a prominent irregular hypoechoic mass larger than the ultrasound screen. Ultrasound of the right axilla showed 2 adjacent small lymph nodes with diffusely thickened cortices, the largest measuring 1.1 cm.  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 There was no HER-2 amplification with a ratio of 1.46 by CISH and an average copy number of 2.85.  On 03/11/2013 the patient underwent bilateral breast MRI. This showed an irregular enhancing mass in the central to upper right breast with nipple retraction measuring in total 6.8 cm. There were several adjacent enhancing nodules as well. There was no involvement of the pectoralis muscle. There were no definite morphologically abnormal right axillary lymph nodes there was no definite internal mammary or lymphadenopathy. Left breast was unremarkable.  The patient's subsequent history is as detailed below    INTERVAL HISTORY: Denise Becker returns today for follow-up and treatment of her estrogen receptor positive breast cancer.   She continues on tamoxifen. She has some occasional hot flashes, but this is unchanged from menopause.   Since her last visit here, she underwent a digital screening unilateral left mammogram with tomography on 09/28/2018 showing: Breast Density Category B. There is no mammographic evidence for malignancy.     REVIEW OF SYSTEMS: Asma notes that she feels some anxiety due to the pandemic and not being able to get out and do things that she would be able to do. She also notes some social stress from family that passed. For exercise, she loves to walk, but she notes that it's been so hot recently that she hasn't been able to get out. The patient denies unusual headaches, visual changes, nausea, vomiting, or dizziness. There has been no unusual cough, phlegm production, or pleurisy. This been no change in bowel or bladder habits. The patient denies unexplained fatigue or unexplained weight loss, bleeding, rash, or fever. A detailed review  of systems was otherwise noncontributory.    PAST MEDICAL HISTORY: Past Medical History:   Diagnosis Date  . Anxiety    anxiousabout impending surgery- loss of breast  . Arthritis    "joints hurt q now and then" (10/25/2013)  . Breast cancer of upper-outer quadrant of right female breast ([place]) 03/10/2013  . Depression    history of depression after son died in the 2000's  . GERD (gastroesophageal reflux disease)    "just chemo related"  . H/O hiatal hernia   . Hypertension   . Radiation 12/08/13-01/17/14   Right chest wall 50.4 Gy  . Spontaneous dislocation of shoulder [phone]   "right; a few times over the years"    PAST SURGICAL HISTORY: Past Surgical History:  Procedure Laterality Date  . ABDOMINAL HYSTERECTOMY  1990's  . BREAST BIOPSY Right 02/2013  . CESAREAN SECTION  1980  . MASTECTOMY COMPLETE / SIMPLE W/ SENTINEL NODE BIOPSY Right 10/25/2013  . MASTECTOMY W/ SENTINEL NODE BIOPSY Right 10/25/2013   Procedure: RIGHT TOTAL MASTECTOMY WITH SENTINEL LYMPH NODE BIOPSY;  Surgeon: Adin Hector, MD;  Location: [facility];  Service: General;  Laterality: Right;  . MULTIPLE TOOTH EXTRACTIONS Bilateral 2011-2014 X 6  . PORT A CATH REVISION Right 04/29/2013   Procedure: PORT A CATH REVISION;  Surgeon: Adin Hector, MD;  Location: [facility];  Service: General;  Laterality: Right;  . PORT-A-CATH REMOVAL  10/25/2013  . PORT-A-CATH REMOVAL N/A 10/25/2013   Procedure: REMOVAL PORT-A-CATH;  Surgeon: Adin Hector, MD;  Location: [facility];  Service: General;  Laterality: N/A;  . PORTACATH PLACEMENT Right 04/15/2013   Procedure: INSERTION PORT-A-CATH;  Surgeon: Adin Hector, MD;  Location: [facility];  Service: General;  Laterality: Right;  . TONSILLECTOMY    . WISDOM TOOTH EXTRACTION      FAMILY HISTORY Family History  Problem Relation Age of Onset  . Cancer Mother   . Hypertension Mother   . Bowel Disease Mother   . Lung disease Father   . Heart disease Maternal Grandmother   . Heart disease Maternal Grandfather   . Breast cancer Neg  Hx   the patient's mother,Denise Becker, had a lung mass but apparently died from unrelated causes (she was followed by Dr. Earlie Server here). She was 69 years old. The patient has little information about her father. The patient had 2 brothers, no sisters. There is no history of breast or ovarian cancer in the family to her knowledge.   GYNECOLOGIC HISTORY:   (Reviewed 08/23/2013) Menarche age 79, first live birth age 63, the patient is GX P1. She went through the change of life approximately 1999. She did not take hormone replacement.  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 She was on disability throughout her treatment, later retired. She is single. The patient's son Helen Hashimoto died at the age of 4 with acute leukemia. The patient has no grandchildren.    ADVANCED DIRECTIVES: Not in place, but the patient was given the appropriate documents to complete and notarize on her 03/16/2013 visit. She intends to name her cousin, Sharrie Rothman, as her healthcare power of attorney. She can be reached at [place]:  (Updated 08/23/2013) Social History   Tobacco Use  . Smoking status: Former Smoker    Years: 3.00    Types: Cigarettes    Quit date: 04/12/2001    Years since quitting:  17.5  . Smokeless tobacco: Never Used  . Tobacco comment: "casual smoker; pack would go stale on me"  Substance Use Topics  . Alcohol use: Yes    Comment: 10/25/2013 "might have a glass of wine a few times/year"  . Drug use: No     Colonoscopy: Not on file  PAP: UTD/Dr. Delsa Sale  Bone density:  Never  Lipid panel:  Dec 2014/Dr. Jackson-Moore   Allergies  Allergen Reactions  . Ciprofloxacin     Patient developed severe diarrhea on cipro  . Penicillins Rash    Current Outpatient Medications  Medication Sig Dispense Refill  . amLODipine (NORVASC) 10 MG tablet Take 10 mg by mouth daily.     Marland Kitchen  azithromycin (ZITHROMAX) 250 MG tablet TK 2 TS PO FOR 1 DAY THEN TK 1 T PO D FOR 4 DAYS    . losartan (COZAAR) 100 MG tablet Take 100 mg by mouth daily.     . naproxen (NAPROSYN) 375 MG tablet Take 1 tablet (375 mg total) by mouth 2 (two) times daily. 20 tablet 0  . tamoxifen (NOLVADEX) 20 MG tablet Take 1 tablet (20 mg total) by mouth daily. 90 tablet 3   No current facility-administered medications for this visit.    Objective: Middle-aged [place] American woman in no acute distress  Vitals:   11/08/18 1453  BP: (!) 199/95  Pulse: (!) 57  Resp: 19  Temp: 98.2 F (36.8 C)  SpO2: 99%   Filed Weights   11/08/18 1453  Weight: 206 lb 8 oz (93.7 kg)   Body mass index is 34.36 kg/m.  ECOG: 1  Sclerae unicteric, EOMs intact Wearing a mask No cervical or supraclavicular adenopathy Lungs no rales or rhonchi Heart regular rate and rhythm Abd soft, nontender, positive bowel sounds MSK no focal spinal tenderness, no upper extremity lymphedema Neuro: nonfocal, well oriented, appropriate affect Breasts: On the right side she has undergone mastectomy and radiation with no evidence of chest wall recurrence.  There is still hyperpigmentation.  On the left side there are no suspicious findings.    LAB RESULTS:   Lab Results  Component Value Date   WBC 6.2 11/08/2018   NEUTROABS 2.3 11/08/2018   HGB 14.1 11/08/2018   HCT 44.1 11/08/2018   MCV 86.1 11/08/2018   PLT 267 11/08/2018      Chemistry      Component Value Date/Time   NA 142 11/08/2018 1437   NA 141 07/29/2016 1120   K 3.2 (L) 11/08/2018 1437   K 3.5 07/29/2016 1120   CL 107 11/08/2018 1437   CO2 27 11/08/2018 1437   CO2 27 07/29/2016 1120   BUN 15 11/08/2018 1437   BUN 13.1 07/29/2016 1120   CREATININE 1.01 (H) 11/08/2018 1437   CREATININE 1.0 07/29/2016 1120      Component Value Date/Time   CALCIUM 9.4 11/08/2018 1437   CALCIUM 9.8 07/29/2016 1120   ALKPHOS 75 11/08/2018 1437   ALKPHOS 65 07/29/2016 1120    AST 16 11/08/2018 1437   AST 13 07/29/2016 1120   ALT 32 11/08/2018 1437   ALT 17 07/29/2016 1120   BILITOT 0.4 11/08/2018 1437   BILITOT 0.37 07/29/2016 1120       STUDIES: No results found.   ASSESSMENT: 68 y.o. [place] woman   (1)  status post right breast biopsy 03/04/2013 for a clinical T3 N1, stage IIIA invasive ductal carcinoma, grade 3, estrogen receptor 100% positive, progesterone receptor 100% positive, with an MIB-1 of  20% and no HER-2 amplification.  (2) right axillary lymph node biopsy 03/04/2013 was negative  (3) neoadjuvant chemotherapy with docetaxel, doxorubicin and cyclophosphamide x6 cycles completed 08/23/2013  (4) status post right total mastectomy and right axillary sentinel node biopsy 10/25/2013 for a residual ypT3 ypN0 invasive lobular carcinoma, again estrogen and progesterone receptor positive and HER-2 nonamplified; with negative margins  (5) adjuvant radiation 12/08/2013-01/17/2014:  Right chest wall/50.4 Pearline Cables @ 1.8 Gray per fraction x 28 fractions  (6) started tamoxifen 02/28/2014--decided against switching to anastrozole  (7) large ventral hernia  PLAN: [place] is now 5 years out with no evidence of disease recurrence.  This is very favorable.  She is tolerating tamoxifen well.  The plan is to continue that for total of 10 years.  She has had some deaths in the family and from friends and this is slowing her down some.  Once it becomes a little cooler I am hopeful she will start a walking program as she did before  Otherwise she will return to see me in 1 year.  She knows to call for any other issues that may develop before then.   [name], Virgie Dad, MD  11/08/18 3:14 PM [facility] [address] [place], [place] [address] Tel. [phone]    Fax. [phone]  I, Jacqualyn Posey am acting as a Education administrator for Chauncey Cruel, MD.   I, Lurline Del MD, have reviewed the above  documentation for accuracy and completeness, and I agree with the above.

## 2018-11-09 ENCOUNTER — Telehealth: Payer: Self-pay | Admitting: Oncology

## 2018-11-09 NOTE — Telephone Encounter (Signed)
I left a message regarding schedule  

## 2019-01-07 ENCOUNTER — Encounter: Payer: Self-pay | Admitting: Podiatry

## 2019-01-07 ENCOUNTER — Ambulatory Visit (INDEPENDENT_AMBULATORY_CARE_PROVIDER_SITE_OTHER): Payer: Medicare HMO

## 2019-01-07 ENCOUNTER — Other Ambulatory Visit: Payer: Self-pay | Admitting: Podiatry

## 2019-01-07 ENCOUNTER — Other Ambulatory Visit: Payer: Self-pay

## 2019-01-07 ENCOUNTER — Ambulatory Visit: Payer: Medicare HMO | Admitting: Podiatry

## 2019-01-07 DIAGNOSIS — M79672 Pain in left foot: Secondary | ICD-10-CM | POA: Diagnosis not present

## 2019-01-07 DIAGNOSIS — M722 Plantar fascial fibromatosis: Secondary | ICD-10-CM

## 2019-01-07 NOTE — Patient Instructions (Signed)

## 2019-01-09 NOTE — Progress Notes (Signed)
Subjective:   Patient ID: Denise Becker, female   DOB: 68 y.o.   MRN: [id number]   HPI Patient states she started off a lot of pain in the bottom of the left heel and mildly in the back and states very sore and making it hard to walk.  Patient states this is occurred over the last few months   ROS      Objective:  Physical Exam  Neurovascular status intact with inflammation pain plantar aspect left heel with fluid buildup and mild discomfort also into the posterior heel     Assessment:  Acute plantar fasciitis left with inflammation along with mild posterior compensation     Plan:  H&P condition reviewed sterile prep done injected the left fascia 3 mg Kenalog 5 mg Xylocaine advised on stretching exercises dispense fascial brace to lift up the arch and reappoint to recheck 2 weeks or earlier if needed  X-rays indicate spur formation no indication to stress fracture arthritis

## 2019-01-21 ENCOUNTER — Other Ambulatory Visit: Payer: Self-pay

## 2019-01-21 ENCOUNTER — Ambulatory Visit: Payer: Medicare HMO | Admitting: Podiatry

## 2019-01-21 ENCOUNTER — Encounter: Payer: Self-pay | Admitting: Podiatry

## 2019-01-21 DIAGNOSIS — M722 Plantar fascial fibromatosis: Secondary | ICD-10-CM | POA: Diagnosis not present

## 2019-01-21 DIAGNOSIS — M79672 Pain in left foot: Secondary | ICD-10-CM

## 2019-01-21 NOTE — Progress Notes (Signed)
Subjective:   Patient ID: Denise Becker, female   DOB: 68 y.o.   MRN: [id number]   HPI Patient states it seems to be gradually improving but still has pain at different times   ROS      Objective:  Physical Exam  Neurovascular status intact with patient found to have continued discomfort plantar heel left quite a bit improved from previous visit plan     Assessment:  Fasciitis left improved but still present     Plan:  H&P condition reviewed recommended continued stretching exercise anti-inflammatory physical therapy and reappoint for [name] to recheck again if symptoms persist and may require more aggressive treatment plan

## 2019-05-27 ENCOUNTER — Ambulatory Visit: Payer: Medicare HMO | Attending: Internal Medicine

## 2019-05-27 DIAGNOSIS — Z23 Encounter for immunization: Secondary | ICD-10-CM | POA: Insufficient documentation

## 2019-05-27 NOTE — Progress Notes (Signed)
   Covid-19 Vaccination Clinic  Name:  Denise Becker    MRN: [id number] DOB: 05/01/1950  05/27/2019  Denise Becker was observed post Covid-19 immunization for 15 minutes without incidence. She was provided with Vaccine Information Sheet and instruction to access the V-Safe system.   Denise Becker was instructed to call 911 with any severe reactions post vaccine: Marland Kitchen Difficulty breathing  . Swelling of your face and throat  . A fast heartbeat  . A bad rash all over your body  . Dizziness and weakness    Immunizations Administered    Name Date Dose VIS Date Route   Pfizer COVID-19 Vaccine 05/27/2019 12:21 PM 0.3 mL 03/11/2019 Intramuscular   Manufacturer: [place]   Lot: [id number]   [place]: [id number]

## 2019-06-21 ENCOUNTER — Ambulatory Visit: Payer: Medicare HMO | Attending: Internal Medicine

## 2019-06-21 DIAGNOSIS — Z23 Encounter for immunization: Secondary | ICD-10-CM

## 2019-06-21 NOTE — Progress Notes (Signed)
   Covid-19 Vaccination Clinic  Name:  Denise Becker    MRN: [id number] DOB: May 30, 1950  06/21/2019  Denise Becker was observed post Covid-19 immunization for 15 minutes without incident. She was provided with Vaccine Information Sheet and instruction to access the V-Safe system.   Denise Becker was instructed to call 911 with any severe reactions post vaccine: Marland Kitchen Difficulty breathing  . Swelling of face and throat  . A fast heartbeat  . A bad rash all over body  . Dizziness and weakness   Immunizations Administered    Name Date Dose VIS Date Route   Pfizer COVID-19 Vaccine 06/21/2019  3:04 PM 0.3 mL 03/11/2019 Intramuscular   Manufacturer: [place]   Lot: [id number]   [place]: [id number]

## 2019-08-12 ENCOUNTER — Other Ambulatory Visit: Payer: Self-pay | Admitting: Oncology

## 2019-08-12 ENCOUNTER — Other Ambulatory Visit: Payer: Self-pay | Admitting: *Deleted

## 2019-08-12 MED ORDER — TAMOXIFEN CITRATE 20 MG PO TABS: 20.0000 mg | ORAL_TABLET | Freq: Every day | ORAL | 3 refills | Status: DC

## 2019-08-18 ENCOUNTER — Encounter (HOSPITAL_COMMUNITY): Payer: Self-pay

## 2019-08-18 ENCOUNTER — Other Ambulatory Visit: Payer: Self-pay

## 2019-08-18 ENCOUNTER — Ambulatory Visit (INDEPENDENT_AMBULATORY_CARE_PROVIDER_SITE_OTHER)
Admission: EM | Admit: 2019-08-18 | Discharge: 2019-08-18 | Disposition: A | Discharge status: Other Acute Inpatient Hospital | Payer: Medicare HMO | Source: Home / Self Care

## 2019-08-18 ENCOUNTER — Emergency Department (HOSPITAL_COMMUNITY)
Admission: EM | Admit: 2019-08-18 | Discharge: 2019-08-19 | Disposition: A | Discharge status: Home / Self Care | Payer: Medicare HMO | Attending: Emergency Medicine | Admitting: Emergency Medicine

## 2019-08-18 DIAGNOSIS — R531 Weakness: Secondary | ICD-10-CM

## 2019-08-18 DIAGNOSIS — I10 Essential (primary) hypertension: Secondary | ICD-10-CM | POA: Diagnosis not present

## 2019-08-18 DIAGNOSIS — R791 Abnormal coagulation profile: Secondary | ICD-10-CM | POA: Diagnosis not present

## 2019-08-18 DIAGNOSIS — Z79899 Other long term (current) drug therapy: Secondary | ICD-10-CM | POA: Diagnosis not present

## 2019-08-18 DIAGNOSIS — Z87891 Personal history of nicotine dependence: Secondary | ICD-10-CM | POA: Diagnosis not present

## 2019-08-18 DIAGNOSIS — R42 Dizziness and giddiness: Secondary | ICD-10-CM | POA: Diagnosis not present

## 2019-08-18 DIAGNOSIS — R519 Headache, unspecified: Secondary | ICD-10-CM | POA: Insufficient documentation

## 2019-08-18 DIAGNOSIS — R269 Unspecified abnormalities of gait and mobility: Secondary | ICD-10-CM

## 2019-08-18 LAB — COMPREHENSIVE METABOLIC PANEL
ALT: 20 U/L (ref 0–44)
AST: 16 U/L (ref 15–41)
Albumin: 3.5 g/dL (ref 3.5–5.0)
Alkaline Phosphatase: 60 U/L (ref 38–126)
Anion gap: 9 (ref 5–15)
BUN: 19 mg/dL (ref 8–23)
CO2: 26 mmol/L (ref 22–32)
Calcium: 9.4 mg/dL (ref 8.9–10.3)
Chloride: 103 mmol/L (ref 98–111)
Creatinine, Ser: 1.15 mg/dL — ABNORMAL HIGH (ref 0.44–1.00)
GFR calc Af Amer: 56 mL/min — ABNORMAL LOW (ref >60)
GFR calc non Af Amer: 49 mL/min — ABNORMAL LOW (ref >60)
Glucose, Bld: 99 mg/dL (ref 70–99)
Potassium: 3.6 mmol/L (ref 3.5–5.1)
Sodium: 138 mmol/L (ref 135–145)
Total Bilirubin: 0.4 mg/dL (ref 0.3–1.2)
Total Protein: 7.8 g/dL (ref 6.5–8.1)

## 2019-08-18 LAB — DIFFERENTIAL
Abs Immature Granulocytes: 0.01 10*3/uL (ref 0.00–0.07)
Basophils Absolute: 0 10*3/uL (ref 0.0–0.1)
Basophils Relative: 0 %
Eosinophils Absolute: 0.2 10*3/uL (ref 0.0–0.5)
Eosinophils Relative: 3 %
Immature Granulocytes: 0 %
Lymphocytes Relative: 40 %
Lymphs Abs: 2.9 10*3/uL (ref 0.7–4.0)
Monocytes Absolute: 0.9 10*3/uL (ref 0.1–1.0)
Monocytes Relative: 12 %
Neutro Abs: 3.3 10*3/uL (ref 1.7–7.7)
Neutrophils Relative %: 45 %

## 2019-08-18 LAB — CBC
HCT: 47.2 % — ABNORMAL HIGH (ref 36.0–46.0)
Hemoglobin: 15.1 g/dL — ABNORMAL HIGH (ref 12.0–15.0)
MCH: 27.8 pg (ref 26.0–34.0)
MCHC: 32 g/dL (ref 30.0–36.0)
MCV: 86.9 fL (ref 80.0–100.0)
Platelets: 269 10*3/uL (ref 150–400)
RBC: 5.43 MIL/uL — ABNORMAL HIGH (ref 3.87–5.11)
RDW: 15 % (ref 11.5–15.5)
WBC: 7.3 10*3/uL (ref 4.0–10.5)
nRBC: 0 % (ref 0.0–0.2)

## 2019-08-18 LAB — I-STAT CHEM 8, ED
BUN: 22 mg/dL (ref 8–23)
Calcium, Ion: 1.2 mmol/L (ref 1.15–1.40)
Chloride: 104 mmol/L (ref 98–111)
Creatinine, Ser: 1.2 mg/dL — ABNORMAL HIGH (ref 0.44–1.00)
Glucose, Bld: 95 mg/dL (ref 70–99)
HCT: 49 % — ABNORMAL HIGH (ref 36.0–46.0)
Hemoglobin: 16.7 g/dL — ABNORMAL HIGH (ref 12.0–15.0)
Potassium: 3.6 mmol/L (ref 3.5–5.1)
Sodium: 141 mmol/L (ref 135–145)
TCO2: 27 mmol/L (ref 22–32)

## 2019-08-18 LAB — APTT: aPTT: 25 seconds (ref 24–36)

## 2019-08-18 LAB — PROTIME-INR
INR: 1 (ref 0.8–1.2)
Prothrombin Time: 12.6 seconds (ref 11.4–15.2)

## 2019-08-18 MED ORDER — SODIUM CHLORIDE 0.9 % IV BOLUS
1000.0000 mL | Freq: Once | INTRAVENOUS | Status: AC
  Administered 2019-08-18: 1000 mL via INTRAVENOUS

## 2019-08-18 MED ORDER — AMLODIPINE BESYLATE 10 MG PO TABS
10.0000 mg | ORAL_TABLET | Freq: Every day | ORAL | 0 refills | Status: DC
Start: 2019-08-18 — End: 2019-09-23

## 2019-08-18 MED ORDER — SODIUM CHLORIDE 0.9% FLUSH
3.0000 mL | Freq: Once | INTRAVENOUS | Status: DC
Start: 2019-08-18 — End: 2019-08-19

## 2019-08-18 MED ORDER — LOSARTAN POTASSIUM 100 MG PO TABS: 100.0000 mg | ORAL_TABLET | Freq: Every day | ORAL | 0 refills | Status: DC

## 2019-08-18 NOTE — ED Notes (Signed)
Pt able to ambulate to bathroom.

## 2019-08-18 NOTE — ED Triage Notes (Signed)
Pt states she does not monitor blood pressure at home, she just normally takes her medication.

## 2019-08-18 NOTE — ED Provider Notes (Signed)
[facility] EMERGENCY DEPARTMENT Provider Note   CSN: [id number] Arrival date & time: 08/18/19  1535     History Chief Complaint  Patient presents with  . Dizziness    Denise Becker is a 69 y.o. female.  69yo F w/ PMH below including HTN, breast cancer, GERD who p/w dizziness.  She reports 2 weeks of dizziness described as lightheadedness/near syncope sensation in her head that occurs mostly with standing up. Sx worse over the past 2 days. She reports poor water intake during the day. Occasional mild headache but no severe headaches. No vomiting, diarrhea or fevers. She notes she has been having balance problems for months.   She is currently out of BP meds, has upcoming appointment with PCP.   The history is provided by the patient.  Dizziness      Past Medical History:  Diagnosis Date  . Anxiety    anxiousabout impending surgery- loss of breast  . Arthritis    "joints hurt q now and then" (10/25/2013)  . Breast cancer of upper-outer quadrant of right female breast ([place]) 03/10/2013  . Depression    history of depression after son died in the 2000's  . GERD (gastroesophageal reflux disease)    "just chemo related"  . H/O hiatal hernia   . Hypertension   . Radiation 12/08/13-01/17/14   Right chest wall 50.4 Gy  . Spontaneous dislocation of shoulder [phone]   "right; a few times over the years"    Patient Active Problem List   Diagnosis Date Noted  . Venous stasis 12/22/2013  . Bilateral lower leg cellulitis 11/11/2013  . Malignant neoplasm of central portion of right breast in female, estrogen receptor positive ([place]) 10/25/2013  . Abdominal wall hernia 08/23/2013  . Edema of both legs 07/12/2013  . Essential hypertension, benign 03/16/2013  . Adjustment disorder with mixed anxiety and depressed mood 03/16/2013  . Breast cancer of upper-outer quadrant of right female breast ([place]) 03/10/2013    Past Surgical History:  Procedure  Laterality Date  . ABDOMINAL HYSTERECTOMY  1990's  . BREAST BIOPSY Right 02/2013  . CESAREAN SECTION  1980  . MASTECTOMY COMPLETE / SIMPLE W/ SENTINEL NODE BIOPSY Right 10/25/2013  . MASTECTOMY W/ SENTINEL NODE BIOPSY Right 10/25/2013   Procedure: RIGHT TOTAL MASTECTOMY WITH SENTINEL LYMPH NODE BIOPSY;  Surgeon: Adin Hector, MD;  Location: [facility];  Service: General;  Laterality: Right;  . MULTIPLE TOOTH EXTRACTIONS Bilateral 2011-2014 X 6  . PORT A CATH REVISION Right 04/29/2013   Procedure: PORT A CATH REVISION;  Surgeon: Adin Hector, MD;  Location: [facility];  Service: General;  Laterality: Right;  . PORT-A-CATH REMOVAL  10/25/2013  . PORT-A-CATH REMOVAL N/A 10/25/2013   Procedure: REMOVAL PORT-A-CATH;  Surgeon: Adin Hector, MD;  Location: [facility];  Service: General;  Laterality: N/A;  . PORTACATH PLACEMENT Right 04/15/2013   Procedure: INSERTION PORT-A-CATH;  Surgeon: Adin Hector, MD;  Location: [facility];  Service: General;  Laterality: Right;  . TONSILLECTOMY    . WISDOM TOOTH EXTRACTION       OB History   No obstetric history on file.     Family History  Problem Relation Age of Onset  . Cancer Mother   . Hypertension Mother   . Bowel Disease Mother   . Lung disease Father   . Heart disease Maternal Grandmother   . Heart disease Maternal Grandfather   . Breast cancer Neg Hx  Social History   Tobacco Use  . Smoking status: Former Smoker    Years: 3.00    Types: Cigarettes    Quit date: 04/12/2001    Years since quitting: 18.3  . Smokeless tobacco: Never Used  . Tobacco comment: "casual smoker; pack would go stale on me"  Substance Use Topics  . Alcohol use: Yes    Comment: 10/25/2013 "might have a glass of wine a few times/year"  . Drug use: No    Home Medications Prior to Admission medications   Medication Sig Start Date End Date Taking? Authorizing Provider  amLODipine (NORVASC) 10 MG tablet Take 1 tablet (10 mg  total) by mouth daily. 08/18/19   Londen Bok, Wenda Overland, MD  COMBIGAN 0.2-0.5 % ophthalmic solution  12/02/18   [provider]  losartan (COZAAR) 100 MG tablet Take 1 tablet (100 mg total) by mouth daily. 08/18/19   Glennis Montenegro, Wenda Overland, MD  LUMIGAN 0.01 % SOLN  12/02/18   [provider]  naproxen (NAPROSYN) 375 MG tablet Take 1 tablet (375 mg total) by mouth 2 (two) times daily. [phone]   Delora Fuel, MD  tamoxifen (NOLVADEX) 20 MG tablet Take 1 tablet (20 mg total) by mouth daily. 08/12/19   [name], Virgie Dad, MD    Allergies    Ciprofloxacin and Penicillins  Review of Systems   Review of Systems  Neurological: Positive for dizziness.   All other systems reviewed and are negative except that which was mentioned in HPI  Physical Exam Updated Vital Signs BP 122/78 (BP Location: Left Arm)   Pulse 67   Temp 98.2 F (36.8 C) (Oral)   Resp 17   Ht 5\' 1"  (1.549 m)   Wt 99.8 kg   SpO2 100%   BMI 41.57 kg/m   Physical Exam Vitals and nursing note reviewed.  Constitutional:      General: She is not in acute distress.    Appearance: She is well-developed.     Comments: Awake, alert  HENT:     Head: Normocephalic and atraumatic.  Eyes:     Extraocular Movements: Extraocular movements intact.     Conjunctiva/sclera: Conjunctivae normal.     Pupils: Pupils are equal, round, and reactive to light.  Cardiovascular:     Rate and Rhythm: Normal rate and regular rhythm.     Heart sounds: Normal heart sounds. No murmur.  Pulmonary:     Effort: Pulmonary effort is normal. No respiratory distress.     Breath sounds: Normal breath sounds.  Abdominal:     General: Bowel sounds are normal. There is no distension.     Palpations: Abdomen is soft.     Tenderness: There is no abdominal tenderness.  Musculoskeletal:     Cervical back: Neck supple.  Skin:    General: Skin is warm and dry.  Neurological:     Mental Status: She is alert and oriented to person, place, and  time.     Cranial Nerves: No cranial nerve deficit.     Motor: No abnormal muscle tone.     Deep Tendon Reflexes: Reflexes are normal and symmetric.     Comments: Fluent speech, normal finger-to-nose testing, negative pronator drift, no clonus 5/5 strength and normal sensation x all 4 extremities  Psychiatric:        Thought Content: Thought content normal.        Judgment: Judgment normal.     ED Results / Procedures / Treatments   Labs (all labs ordered  are listed, but only abnormal results are displayed) Labs Reviewed  CBC - Abnormal; Notable for the following components:      Result Value   RBC 5.43 (*)    Hemoglobin 15.1 (*)    HCT 47.2 (*)    All other components within normal limits  COMPREHENSIVE METABOLIC PANEL - Abnormal; Notable for the following components:   Creatinine, Ser 1.15 (*)    GFR calc non Af Amer 49 (*)    GFR calc Af Amer 56 (*)    All other components within normal limits  I-STAT CHEM 8, ED - Abnormal; Notable for the following components:   Creatinine, Ser 1.20 (*)    Hemoglobin 16.7 (*)    HCT 49.0 (*)    All other components within normal limits  PROTIME-INR  APTT  DIFFERENTIAL    EKG EKG Interpretation  Date/Time:  Thursday Aug 18 2019 15:36:36 EDT Ventricular Rate:  68 PR Interval:  172 QRS Duration: 90 QT Interval:  368 QTC Calculation: 391 R Axis:   63 Text Interpretation: Normal sinus rhythm T wave abnormality, consider inferolateral ischemia Abnormal ECG non-specific T wave changes inferiorly compared to previous although artifact limits interpretation Confirmed by Theotis Burrow [phone]) on 08/18/2019 8:40:48 PM   Radiology No results found.  Procedures Procedures (including critical care time)  Medications Ordered in ED Medications  sodium chloride 0.9 % bolus 1,000 mL (0 mLs Intravenous Stopped 08/18/19 2342)    ED Course  I have reviewed the triage vital signs and the nursing notes.  Pertinent labs  that were available  during my care of the patient were reviewed by me and considered in my medical decision making (see chart for details).    MDM Rules/Calculators/A&P                      She was well-appearing and neurologically intact on exam, hypertensive.  EKG reassuring.  Lab work shows creatinine 1.2, otherwise unremarkable.  Patient does admit that she has not been drinking as much lately, gave fluid bolus.  Patient able to ambulate in the ED with no problems.  She gives no description of vertiginous symptoms or any other red flag symptoms to suggest intracranial process. I have recommended good hydration at home and close f/u with PCP next week; she already has appointment scheduled. Return precautions reviewed. Final Clinical Impression(s) / ED Diagnoses Final diagnoses:  Lightheadedness    Rx / DC Orders ED Discharge Orders         Ordered    amLODipine (NORVASC) 10 MG tablet  Daily     08/18/19 2347    losartan (COZAAR) 100 MG tablet  Daily     08/18/19 2347           Adrionna Delcid, Wenda Overland, MD 08/20/19 0139

## 2019-08-18 NOTE — ED Triage Notes (Signed)
Pt presents with some progressing dizziness, lower extremity weakness, headache, and neck pain.  Pt has hypertension and has been without blood pressure medication for about a week or so.

## 2019-08-18 NOTE — ED Provider Notes (Signed)
[facility]    CSN: [id number] Arrival date & time: 08/18/19  1423      History   Chief Complaint Chief Complaint  Patient presents with  . Dizziness  . Weakness  . Headache    HPI Denise Becker is a 69 y.o. female.   Patient is a 69 year old female with past medical history of anxiety, arthritis, breast cancer, depression, GERD, hypertension.  She presents today with worsening dizziness, lower extremity weakness, headache, gait abnormality.  This is been an ongoing issue for her off and on for months but worsening over the past 2 weeks and extremely worse over the past 2 days.  She has been without her blood pressure medicine for about a week.  The symptoms started prior to this.  Describes the dizziness as both feeling like the room is spinning and like she is going to pass out at times.  The pain is behind her eye and throbbing.  Does have some issues with vision in the eye but has been seeing an eye specialist for this.  Reporting he recommended surgery of some sort.  She has been having multiple falls due to weakness in her legs since January.  At one point she reports she fell and was unable to get up for like 10 minutes and had to have somebody help her up.  She is very anxious and scared about what is going on with her.  Denies any slurred speech, specific unilateral weakness, facial droop.  ROS per HPI      Past Medical History:  Diagnosis Date  . Anxiety    anxiousabout impending surgery- loss of breast  . Arthritis    "joints hurt q now and then" (10/25/2013)  . Breast cancer of upper-outer quadrant of right female breast ([place]) 03/10/2013  . Depression    history of depression after son died in the 2000's  . GERD (gastroesophageal reflux disease)    "just chemo related"  . H/O hiatal hernia   . Hypertension   . Radiation 12/08/13-01/17/14   Right chest wall 50.4 Gy  . Spontaneous dislocation of shoulder [phone]   "right; a few times over the  years"    Patient Active Problem List   Diagnosis Date Noted  . Venous stasis 12/22/2013  . Bilateral lower leg cellulitis 11/11/2013  . Malignant neoplasm of central portion of right breast in female, estrogen receptor positive ([place]) 10/25/2013  . Abdominal wall hernia 08/23/2013  . Edema of both legs 07/12/2013  . Essential hypertension, benign 03/16/2013  . Adjustment disorder with mixed anxiety and depressed mood 03/16/2013  . Breast cancer of upper-outer quadrant of right female breast ([place]) 03/10/2013    Past Surgical History:  Procedure Laterality Date  . ABDOMINAL HYSTERECTOMY  1990's  . BREAST BIOPSY Right 02/2013  . CESAREAN SECTION  1980  . MASTECTOMY COMPLETE / SIMPLE W/ SENTINEL NODE BIOPSY Right 10/25/2013  . MASTECTOMY W/ SENTINEL NODE BIOPSY Right 10/25/2013   Procedure: RIGHT TOTAL MASTECTOMY WITH SENTINEL LYMPH NODE BIOPSY;  Surgeon: Adin Hector, MD;  Location: [facility];  Service: General;  Laterality: Right;  . MULTIPLE TOOTH EXTRACTIONS Bilateral 2011-2014 X 6  . PORT A CATH REVISION Right 04/29/2013   Procedure: PORT A CATH REVISION;  Surgeon: Adin Hector, MD;  Location: [facility];  Service: General;  Laterality: Right;  . PORT-A-CATH REMOVAL  10/25/2013  . PORT-A-CATH REMOVAL N/A 10/25/2013   Procedure: REMOVAL PORT-A-CATH;  Surgeon: Adin Hector, MD;  Location: [facility];  Service: General;  Laterality: N/A;  . PORTACATH PLACEMENT Right 04/15/2013   Procedure: INSERTION PORT-A-CATH;  Surgeon: Adin Hector, MD;  Location: [facility];  Service: General;  Laterality: Right;  . TONSILLECTOMY    . WISDOM TOOTH EXTRACTION      OB History   No obstetric history on file.      Home Medications    Prior to Admission medications   Medication Sig Start Date End Date Taking? Authorizing Provider  amLODipine (NORVASC) 10 MG tablet Take 10 mg by mouth daily.  05/24/15   [provider]  COMBIGAN 0.2-0.5 % ophthalmic  solution  12/02/18   [provider]  losartan (COZAAR) 100 MG tablet Take 100 mg by mouth daily.  05/24/15   [provider]  LUMIGAN 0.01 % SOLN  12/02/18   [provider]  naproxen (NAPROSYN) 375 MG tablet Take 1 tablet (375 mg total) by mouth 2 (two) times daily. [id number]   Delora Fuel, MD  tamoxifen (NOLVADEX) 20 MG tablet Take 1 tablet (20 mg total) by mouth daily. 08/12/19   [name], Virgie Dad, MD    Family History Family History  Problem Relation Age of Onset  . Cancer Mother   . Hypertension Mother   . Bowel Disease Mother   . Lung disease Father   . Heart disease Maternal Grandmother   . Heart disease Maternal Grandfather   . Breast cancer Neg Hx     Social History Social History   Tobacco Use  . Smoking status: Former Smoker    Years: 3.00    Types: Cigarettes    Quit date: 04/12/2001    Years since quitting: 18.3  . Smokeless tobacco: Never Used  . Tobacco comment: "casual smoker; pack would go stale on me"  Substance Use Topics  . Alcohol use: Yes    Comment: 10/25/2013 "might have a glass of wine a few times/year"  . Drug use: No     Allergies   Ciprofloxacin and Penicillins   Review of Systems Review of Systems   Physical Exam Triage Vital Signs ED Triage Vitals  Enc Vitals Group     BP 08/18/19 1453 (!) 168/81     Pulse Rate 08/18/19 1453 69     Resp 08/18/19 1453 17     Temp 08/18/19 1453 98.1 F (36.7 C)     Temp Source 08/18/19 1453 Oral     SpO2 08/18/19 1453 98 %     Weight --      Height --      Head Circumference --      Peak Flow --      Pain Score 08/18/19 1450 6     Pain Loc --      Pain Edu? --      Excl. in [place]? --    No data found.  Updated Vital Signs BP (!) 168/81 (BP Location: Left Arm)   Pulse 69   Temp 98.1 F (36.7 C) (Oral)   Resp 17   SpO2 98%   Visual Acuity Right Eye Distance:   Left Eye Distance:   Bilateral Distance:    Right Eye Near:   Left Eye Near:    Bilateral Near:      Physical Exam Vitals and nursing note reviewed.  Constitutional:      General: She is not in acute distress.    Appearance: Normal appearance. She is not ill-appearing, toxic-appearing or diaphoretic.  Comments: Sitting in wheelchair  HENT:     Head: Normocephalic.     Nose: Nose normal.  Eyes:     Conjunctiva/sclera: Conjunctivae normal.  Pulmonary:     Effort: Pulmonary effort is normal.  Musculoskeletal:        General: Normal range of motion.     Cervical back: Normal range of motion.  Skin:    General: Skin is warm and dry.     Findings: No rash.  Neurological:     Mental Status: She is alert.     Comments: Speech normal  Psychiatric:        Mood and Affect: Mood normal.        Behavior: Behavior normal.      UC Treatments / Results  Labs (all labs ordered are listed, but only abnormal results are displayed) Labs Reviewed - No data to display  EKG   Radiology No results found.  Procedures Procedures (including critical care time)  Medications Ordered in UC Medications - No data to display  Initial Impression / Assessment and Plan / UC Course  I have reviewed the triage vital signs and the nursing notes.  Pertinent labs & imaging results that were available during my care of the patient were reviewed by me and considered in my medical decision making (see chart for details).     69 year old female presents today with worsening dizziness, lower extremity weakness, gait abnormality, headache. Based on symptoms recommend ER for further evaluation and management and most likely CT scan.  Concern for some sort of intracranial abnormality. Patient being wheeled down in a wheelchair by staff Final Clinical Impressions(s) / UC Diagnoses   Final diagnoses:  Dizziness  Abnormality of gait  Weakness     Discharge Instructions     Please go to the ER for further evaluation and CT scan    ED Prescriptions    None     PDMP not reviewed this  encounter.   Orvan July, NP 08/18/19 1524

## 2019-08-18 NOTE — ED Triage Notes (Signed)
Pt here from home for evaluation of two weeks of dizziness, worse over the last two days. Also states "my balance is off" for over two weeks. Endorses pain at the base of her neck but thinks she just slept wrong.

## 2019-08-18 NOTE — Discharge Instructions (Addendum)
Please go to the ER for further evaluation and CT scan

## 2019-08-18 NOTE — ED Notes (Signed)
Patient is being discharged from the [facility] and sent to the Emergency Department via wheelchair by staff. Per Dr Lanny Cramp, patient is stable but in need of higher level of care due to dizziness and near syncope. Patient is aware and verbalizes understanding of plan of care.   Vitals:   08/18/19 1453  BP: (!) 168/81  Pulse: 69  Resp: 17  Temp: 98.1 F (36.7 C)  SpO2: 98%

## 2019-08-24 ENCOUNTER — Other Ambulatory Visit: Payer: Self-pay | Admitting: Family Medicine

## 2019-08-24 DIAGNOSIS — R4182 Altered mental status, unspecified: Secondary | ICD-10-CM

## 2019-08-24 DIAGNOSIS — R42 Dizziness and giddiness: Secondary | ICD-10-CM

## 2019-08-24 DIAGNOSIS — R269 Unspecified abnormalities of gait and mobility: Secondary | ICD-10-CM

## 2019-09-06 ENCOUNTER — Other Ambulatory Visit: Payer: Self-pay

## 2019-09-06 ENCOUNTER — Ambulatory Visit
Admission: RE | Admit: 2019-09-06 | Discharge: 2019-09-06 | Disposition: A | Discharge status: Home / Self Care | Payer: Medicare HMO | Source: Ambulatory Visit | Attending: Family Medicine | Admitting: Family Medicine

## 2019-09-06 DIAGNOSIS — R42 Dizziness and giddiness: Secondary | ICD-10-CM

## 2019-09-06 DIAGNOSIS — R269 Unspecified abnormalities of gait and mobility: Secondary | ICD-10-CM

## 2019-09-06 DIAGNOSIS — R4182 Altered mental status, unspecified: Secondary | ICD-10-CM

## 2019-09-07 ENCOUNTER — Other Ambulatory Visit: Payer: Self-pay | Admitting: Podiatry

## 2019-09-07 ENCOUNTER — Encounter: Payer: Self-pay | Admitting: Podiatry

## 2019-09-07 ENCOUNTER — Ambulatory Visit: Payer: Medicare HMO | Admitting: Podiatry

## 2019-09-07 ENCOUNTER — Ambulatory Visit (INDEPENDENT_AMBULATORY_CARE_PROVIDER_SITE_OTHER): Payer: Medicare HMO

## 2019-09-07 DIAGNOSIS — R2681 Unsteadiness on feet: Secondary | ICD-10-CM | POA: Diagnosis not present

## 2019-09-07 DIAGNOSIS — M79671 Pain in right foot: Secondary | ICD-10-CM | POA: Diagnosis not present

## 2019-09-07 DIAGNOSIS — M722 Plantar fascial fibromatosis: Secondary | ICD-10-CM

## 2019-09-07 DIAGNOSIS — M79672 Pain in left foot: Secondary | ICD-10-CM

## 2019-09-08 NOTE — Progress Notes (Signed)
Subjective:   Patient ID: Denise Becker, female   DOB: 69 y.o.   MRN: [id number]   HPI Patient presents stating that she is feeling numbness in her legs and instability and does not remember specific injury.  States that she has had a CT of her brain is due to see cardiologist but is still concerned about what might be happening   ROS      Objective:  Physical Exam  Vascular status indicates good pulses PT and DP.  Patient does have some muscle weakness but I did not ascertain complete loss of control and did have a reduced sharp dull vibratory but appeared to be intact.  Patient has good digital perfusion well oriented x3 and is now using a walker over the last few weeks     Assessment:  Strong possibility for a neurological occurrence here versus a vascular recurrence or anything to do with heart condition     Plan:  H&P addition discussed x-rays reviewed and at this point I have recommended seeing a neurologist for probable nerve conduction studies EMGs results of CT scan of the brain and also check of the back.  We discussed the possibility of balance bracing if everything comes out normal  X-rays indicate no signs of significant pathology appears to be more soft tissue than bony in origin

## 2019-09-19 ENCOUNTER — Other Ambulatory Visit: Payer: Self-pay | Admitting: Oncology

## 2019-09-19 DIAGNOSIS — Z1231 Encounter for screening mammogram for malignant neoplasm of breast: Secondary | ICD-10-CM

## 2019-09-23 ENCOUNTER — Encounter: Payer: Self-pay | Admitting: Neurology

## 2019-09-23 ENCOUNTER — Encounter: Payer: Self-pay | Admitting: Cardiology

## 2019-09-23 ENCOUNTER — Ambulatory Visit: Payer: Medicare HMO | Admitting: Cardiology

## 2019-09-23 ENCOUNTER — Other Ambulatory Visit: Payer: Self-pay

## 2019-09-23 VITALS — BP 170/87 | HR 76 | Resp 17 | Ht 61.0 in | Wt 208.0 lb

## 2019-09-23 DIAGNOSIS — E66812 Obesity, class 2: Secondary | ICD-10-CM

## 2019-09-23 DIAGNOSIS — Z87891 Personal history of nicotine dependence: Secondary | ICD-10-CM

## 2019-09-23 DIAGNOSIS — I10 Essential (primary) hypertension: Secondary | ICD-10-CM

## 2019-09-23 DIAGNOSIS — Z6839 Body mass index (BMI) 39.0-39.9, adult: Secondary | ICD-10-CM

## 2019-09-23 DIAGNOSIS — Z853 Personal history of malignant neoplasm of breast: Secondary | ICD-10-CM

## 2019-09-23 DIAGNOSIS — M7989 Other specified soft tissue disorders: Secondary | ICD-10-CM

## 2019-09-23 MED ORDER — HYDROCHLOROTHIAZIDE 25 MG PO TABS: 25.0000 mg | ORAL_TABLET | Freq: Every day | ORAL | 0 refills | Status: DC

## 2019-09-23 NOTE — Patient Instructions (Addendum)
Stop Norvasc/amlodipine  Start HCTZ  Labs in one week after starting HCTZ at [facility].

## 2019-09-23 NOTE — Progress Notes (Signed)
Date:  09/23/2019   ID:  [name], DOB [birth year], MRN [id number]  PCP:  Lin Landsman, MD  Cardiologist:  Rex Kras, DO, [facility] (established care 09/23/2019)  REASON FOR CONSULT: Other abnormalities of gait and mobility  REQUESTING PHYSICIAN:  Lin Landsman, [place] [address] [place],  [place] [address]  Chief Complaint  Patient presents with   Edema   Hypertension   New Patient (Initial Visit)    HPI  Denise Becker is a 69 y.o. female who presents to the office with a chief complaint of " lower extremity swelling and with blood pressure." She is referred to the office at the request of Lin Landsman, MD. Patient's past medical history and cardiovascular risk factors include: History of breast cancer status post mastectomy/radiation/chemotherapy, hypertension, postmenopausal female, advanced age, obesity due to excess calories.  As per the referral patient is referred to the office for abnormalities of gait and mobility.  However, patient states that that its secondary to hypertension and lower extremity swelling.  Patient recently has had difficulty with her gait over the last 3 weeks.  She denies any focal neurological deficits and also been evaluated by her primary care provider and had a CT scan recently.  She is referred to cardiology for cardiovascular evaluation given her lower extremity swelling and elevated blood pressures.  Patient does not check her blood pressures at home and is currently on amlodipine and losartan both medication she takes in the morning.  Her office blood pressures are elevated despite rechecking them twice.  Her diet is high in salt/this evening and also consumes canned foods and processed foods on a periodic basis.  Lower extremity swelling has been present for approximately 1 year.  She notices them after prolonged period of standing and usually gets better during the morning hours.  She denies any immobilization or recent surgeries.  No  prior history of DVT or PE.  FUNCTIONAL STATUS: No structured exercise program or daily routine.    ALLERGIES: Allergies  Allergen Reactions   Ciprofloxacin     Patient developed severe diarrhea on cipro   Penicillins Rash    MEDICATION LIST PRIOR TO VISIT: Current Meds  Medication Sig   COMBIGAN 0.2-0.5 % ophthalmic solution    dorzolamide (TRUSOPT) 2 % ophthalmic solution Place 1 drop into the right eye 2 (two) times daily.   losartan (COZAAR) 100 MG tablet Take 1 tablet (100 mg total) by mouth daily.   LUMIGAN 0.01 % SOLN    RHOPRESSA 0.02 % SOLN Place 1 drop into the right eye at bedtime.   tamoxifen (NOLVADEX) 20 MG tablet Take 1 tablet (20 mg total) by mouth daily.   [DISCONTINUED] amLODipine (NORVASC) 10 MG tablet Take 1 tablet (10 mg total) by mouth daily.     PAST MEDICAL HISTORY: Past Medical History:  Diagnosis Date   Anxiety    anxiousabout impending surgery- loss of breast   Arthritis    "joints hurt q now and then" (10/25/2013)   Breast cancer of upper-outer quadrant of right female breast ([facility]) 03/10/2013   Depression    history of depression after son died in the 2000's   GERD (gastroesophageal reflux disease)    "just chemo related"   H/O hiatal hernia    Hypertension    Radiation 12/08/13-01/17/14   Right chest wall 50.4 Gy   Spontaneous dislocation of shoulder [phone]   "right; a few times over the years"    PAST SURGICAL HISTORY: Past Surgical History:  Procedure Laterality Date   ABDOMINAL HYSTERECTOMY  1990's   BREAST BIOPSY Right 02/2013   CESAREAN SECTION  1980   MASTECTOMY COMPLETE / SIMPLE W/ SENTINEL NODE BIOPSY Right 10/25/2013   MASTECTOMY W/ SENTINEL NODE BIOPSY Right 10/25/2013   Procedure: RIGHT TOTAL MASTECTOMY WITH SENTINEL LYMPH NODE BIOPSY;  Surgeon: Adin Hector, MD;  Location: [facility];  Service: General;  Laterality: Right;   MULTIPLE TOOTH EXTRACTIONS Bilateral 2011-2014 X 6   PORT A CATH REVISION  Right 04/29/2013   Procedure: PORT A CATH REVISION;  Surgeon: Adin Hector, MD;  Location: [facility];  Service: General;  Laterality: Right;   PORT-A-CATH REMOVAL  10/25/2013   PORT-A-CATH REMOVAL N/A 10/25/2013   Procedure: REMOVAL PORT-A-CATH;  Surgeon: Adin Hector, MD;  Location: [facility];  Service: General;  Laterality: N/A;   PORTACATH PLACEMENT Right 04/15/2013   Procedure: INSERTION PORT-A-CATH;  Surgeon: Adin Hector, MD;  Location: [facility];  Service: General;  Laterality: Right;   TONSILLECTOMY     WISDOM TOOTH EXTRACTION      FAMILY HISTORY: The patient family history includes Bowel Disease in her mother; Cancer in her father; Heart attack in her maternal uncle; Heart disease in her maternal grandfather, maternal grandmother, and maternal uncle; Hypertension in her mother; Lung disease in her father.  SOCIAL HISTORY:  The patient  reports that she quit smoking about 18 years ago. Her smoking use included cigarettes. She has a 0.75 pack-year smoking history. She has never used smokeless tobacco. She reports current alcohol use. She reports that she does not use drugs.  REVIEW OF SYSTEMS: Review of Systems  Constitutional: Negative for chills and fever.  HENT: Negative for hoarse voice and nosebleeds.   Eyes: Negative for discharge, double vision and pain.  Cardiovascular: Positive for leg swelling. Negative for chest pain, claudication, dyspnea on exertion, near-syncope, orthopnea, palpitations, paroxysmal nocturnal dyspnea and syncope.  Respiratory: Negative for hemoptysis and shortness of breath.   Musculoskeletal: Positive for arthritis. Negative for muscle cramps and myalgias.  Gastrointestinal: Negative for abdominal pain, constipation, diarrhea, hematemesis, hematochezia, melena, nausea and vomiting.  Neurological: Negative for dizziness and light-headedness.    PHYSICAL EXAM: Vitals with BMI 09/23/2019 09/23/2019 08/19/2019    Height - '5\' 1"'  -  Weight - 208 lbs -  BMI - 88.50 -  Systolic 277 412 878  Diastolic 87 92 78  Pulse 76 75 67    CONSTITUTIONAL: Appears older than stated age, hemodynamically stable, no acute distress.    SKIN: Skin is warm and dry. No rash noted. No cyanosis. No pallor. No jaundice HEAD: Normocephalic and atraumatic.  EYES: No scleral icterus MOUTH/THROAT: Moist oral membranes.  NECK: No JVD present. No thyromegaly noted. No carotid bruits  LYMPHATIC: No visible cervical adenopathy.  CHEST Normal respiratory effort. No intercostal retractions.  Right mastectomy LUNGS: Clear to auscultation bilaterally.  No stridor. No wheezes. No rales.  CARDIOVASCULAR: Regular, positive S1-S2, no murmurs rubs or gallops appreciated ABDOMINAL: Obese, soft, nontender, nondistended, positive bowel sounds in all 4 quadrants.  No apparent ascites.  EXTREMITIES: Trace nonpitting peripheral edema.  HEMATOLOGIC: No significant bruising NEUROLOGIC: Oriented to person, place, and time. Nonfocal. Normal muscle tone.  PSYCHIATRIC: Normal mood and affect. Normal behavior. Cooperative  CARDIAC DATABASE: EKG: 09/23/2019 normal sinus rhythm, 62 bpm, normal axis, nonspecific T wave abnormality.  Echocardiogram: 03/29/2013: LVEF 55-60%, normal wall motion, no regional wall motion abnormalities, average global longitudinal strain -18.6%.    Stress  Testing: None  Heart Catheterization: None  LABORATORY DATA: CBC Latest Ref Rng & Units 08/18/2019 08/18/2019 11/08/2018  WBC 4.0 - 10.5 K/uL - 7.3 6.2  Hemoglobin 12.0 - 15.0 g/dL 16.7(H) 15.1(H) 14.1  Hematocrit 36 - 46 % 49.0(H) 47.2(H) 44.1  Platelets 150 - 400 K/uL - 269 267    CMP Latest Ref Rng & Units 08/18/2019 08/18/2019 11/08/2018  Glucose 70 - 99 mg/dL 95 99 89  BUN 8 - 23 mg/dL '22 19 15  ' Creatinine 0.44 - 1.00 mg/dL 1.20(H) 1.15(H) 1.01(H)  Sodium 135 - 145 mmol/L 141 138 142  Potassium 3.5 - 5.1 mmol/L 3.6 3.6 3.2(L)  Chloride 98 - 111 mmol/L 104  103 107  CO2 22 - 32 mmol/L - 26 27  Calcium 8.9 - 10.3 mg/dL - 9.4 9.4  Total Protein 6.5 - 8.1 g/dL - 7.8 7.6  Total Bilirubin 0.3 - 1.2 mg/dL - 0.4 0.4  Alkaline Phos 38 - 126 U/L - 60 75  AST 15 - 41 U/L - 16 16  ALT 0 - 44 U/L - 20 32    Lipid Panel     Component Value Date/Time   CHOL 182 03/17/2014 1107   HDL 56 03/02/2013 1135    No components found for: NTPROBNP No results for input(s): PROBNP in the last 8760 hours. No results for input(s): TSH in the last 8760 hours.  BMP Recent Labs    11/08/18 1437 08/18/19 1601 08/18/19 1616  NA 142 138 141  K 3.2* 3.6 3.6  CL 107 103 104  CO2 27 26  --   GLUCOSE 89 99 95  BUN '15 19 22  ' CREATININE 1.01* 1.15* 1.20*  CALCIUM 9.4 9.4  --   GFRNONAA 57* 49*  --   GFRAA >60 56*  --    External Labs: Collected: 08/23/2019 Creatinine 0.87 mg/dL. eGFR: 79 mL/min per 1.73 m Lipid profile: Total cholesterol 183, triglycerides 62, HDL 56, LDL 113, non-HDL 127 TSH: 2.46   IMPRESSION:    ICD-10-CM   1. Essential hypertension, benign  I10 EKG 56-LSLH    Basic metabolic panel    Magnesium    hydrochlorothiazide (HYDRODIURIL) 25 MG tablet  2. Leg swelling  M79.89 PCV ECHOCARDIOGRAM COMPLETE    PCV LOWER VENOUS US (BILATERAL)  3. Former smoker  Z87.891   4. Hx of breast cancer  Z85.3   5. Class 2 obesity due to excess calories without serious comorbidity with body mass index (BMI) of 39.0 to 39.9 in adult  E66.09    Z68.39      RECOMMENDATIONS: IZUMI MIXON is a 69 y.o. female whose past medical history and cardiac risk factors include: History of breast cancer status post mastectomy/radiation/chemotherapy, hypertension, postmenopausal female, advanced age, obesity due to excess calories.  Lower extremity swelling:  Discontinue amlodipine.  Lower extremity venous duplex to evaluate for chronic venous insufficiency and DVT.  Educated on importance of a low-salt diet.  Benign essential hypertension,  uncontrolled:  Discontinue amlodipine secondary to lower extremity swelling.  Recommended her to take her losartan at night.  We will start hydrochlorothiazide 25 mg p.o. every morning  Blood work in 1 week to evaluate kidney function electrolytes.  Patient will be enrolled into principal care management for ambulatory blood pressure monitoring.  Educated on the importance of reducing salt/seasoning in her daily foods, reducing the amount of canned and frozen foods.  Given her history of breast cancer and chemotherapy would recommend a repeat echocardiogram to evaluate left  ventricular function.  Obesity, due to excess calories: Body mass index is 39.3 kg/m.  I reviewed with the patient the importance of diet, regular physical activity/exercise, weight loss.    Patient is educated on increasing physical activity gradually as tolerated.  With the goal of moderate intensity exercise for 30 minutes a day 5 days a week.  Former smoker: Educated on the importance of continued smoking cessation.  FINAL MEDICATION LIST END OF ENCOUNTER: Meds ordered this encounter  Medications   hydrochlorothiazide (HYDRODIURIL) 25 MG tablet    Sig: Take 1 tablet (25 mg total) by mouth daily.    Dispense:  90 tablet    Refill:  0    Medications Discontinued During This Encounter  Medication Reason   naproxen (NAPROSYN) 375 MG tablet Patient Preference   hydrOXYzine (VISTARIL) 50 MG capsule Patient Preference   hydroquinone 4 % cream Patient Preference   amLODipine (NORVASC) 10 MG tablet Change in therapy     Current Outpatient Medications:    COMBIGAN 0.2-0.5 % ophthalmic solution, , Disp: , Rfl:    dorzolamide (TRUSOPT) 2 % ophthalmic solution, Place 1 drop into the right eye 2 (two) times daily., Disp: , Rfl:    losartan (COZAAR) 100 MG tablet, Take 1 tablet (100 mg total) by mouth daily., Disp: 7 tablet, Rfl: 0   LUMIGAN 0.01 % SOLN, , Disp: , Rfl:    RHOPRESSA 0.02 % SOLN, Place  1 drop into the right eye at bedtime., Disp: , Rfl:    tamoxifen (NOLVADEX) 20 MG tablet, Take 1 tablet (20 mg total) by mouth daily., Disp: 30 tablet, Rfl: 3   hydrochlorothiazide (HYDRODIURIL) 25 MG tablet, Take 1 tablet (25 mg total) by mouth daily., Disp: 90 tablet, Rfl: 0  Orders Placed This Encounter  Procedures   Basic metabolic panel   Magnesium   EKG 12-Lead   PCV ECHOCARDIOGRAM COMPLETE   PCV LOWER VENOUS US (BILATERAL)    Patient Instructions  Stop Norvasc/amlodipine  Start HCTZ  Labs in one week after starting HCTZ at [facility].    --Continue cardiac medications as reconciled in final medication list. --Return in about 4 weeks (around 10/21/2019). Or sooner if needed. --Continue follow-up with your primary care physician regarding the management of your other chronic comorbid conditions.  Patient's questions and concerns were addressed to her satisfaction. She voices understanding of the instructions provided during this encounter.   This note was created using a voice recognition software as a result there may be grammatical errors inadvertently enclosed that do not reflect the nature of this encounter. Every attempt is made to correct such errors.  Rex Kras, [place], [facility]  Pager: [phone] Office: [phone]

## 2019-09-29 ENCOUNTER — Ambulatory Visit: Payer: Medicare HMO

## 2019-09-29 ENCOUNTER — Other Ambulatory Visit: Payer: Self-pay

## 2019-09-29 DIAGNOSIS — M7989 Other specified soft tissue disorders: Secondary | ICD-10-CM

## 2019-09-29 DIAGNOSIS — I10 Essential (primary) hypertension: Secondary | ICD-10-CM

## 2019-09-29 DIAGNOSIS — R600 Localized edema: Secondary | ICD-10-CM

## 2019-10-11 LAB — BASIC METABOLIC PANEL
BUN/Creatinine Ratio: 12 (ref 12–28)
BUN: 12 mg/dL (ref 8–27)
CO2: 24 mmol/L (ref 20–29)
Calcium: 9.5 mg/dL (ref 8.7–10.3)
Chloride: 102 mmol/L (ref 96–106)
Creatinine, Ser: 0.97 mg/dL (ref 0.57–1.00)
GFR calc Af Amer: 69 mL/min/{1.73_m2} (ref >59)
GFR calc non Af Amer: 60 mL/min/{1.73_m2} (ref >59)
Glucose: 80 mg/dL (ref 65–99)
Potassium: 3.5 mmol/L (ref 3.5–5.2)
Sodium: 139 mmol/L (ref 134–144)

## 2019-10-11 LAB — MAGNESIUM: Magnesium: 2 mg/dL (ref 1.6–2.3)

## 2019-10-14 ENCOUNTER — Ambulatory Visit
Admission: RE | Admit: 2019-10-14 | Discharge: 2019-10-14 | Disposition: A | Discharge status: Home / Self Care | Payer: Medicare HMO | Source: Ambulatory Visit | Attending: Oncology | Admitting: Oncology

## 2019-10-14 ENCOUNTER — Other Ambulatory Visit: Payer: Self-pay

## 2019-10-14 DIAGNOSIS — Z1231 Encounter for screening mammogram for malignant neoplasm of breast: Secondary | ICD-10-CM

## 2019-10-18 ENCOUNTER — Encounter: Payer: Self-pay | Admitting: Cardiology

## 2019-10-18 ENCOUNTER — Other Ambulatory Visit: Payer: Self-pay

## 2019-10-18 ENCOUNTER — Ambulatory Visit: Payer: Medicare HMO | Admitting: Cardiology

## 2019-10-18 VITALS — BP 150/68 | HR 59 | Ht 61.0 in | Wt 214.0 lb

## 2019-10-18 DIAGNOSIS — Z712 Person consulting for explanation of examination or test findings: Secondary | ICD-10-CM

## 2019-10-18 DIAGNOSIS — Z853 Personal history of malignant neoplasm of breast: Secondary | ICD-10-CM

## 2019-10-18 DIAGNOSIS — E6609 Other obesity due to excess calories: Secondary | ICD-10-CM

## 2019-10-18 DIAGNOSIS — Z87891 Personal history of nicotine dependence: Secondary | ICD-10-CM

## 2019-10-18 DIAGNOSIS — I10 Essential (primary) hypertension: Secondary | ICD-10-CM

## 2019-10-18 DIAGNOSIS — M7989 Other specified soft tissue disorders: Secondary | ICD-10-CM

## 2019-10-18 MED ORDER — BIDIL 20-37.5 MG PO TABS: 1.0000 | ORAL_TABLET | Freq: Three times a day (TID) | ORAL | 2 refills | Status: DC

## 2019-10-18 NOTE — Progress Notes (Signed)
Date:  10/18/2019   ID:  Denise Becker, DOB November 18, 1950, MRN [id number]  PCP:  Lin Landsman, MD  Cardiologist:  Rex Kras, DO, [facility] (established care 09/23/2019)  REASON FOR CONSULT: Other abnormalities of gait and mobility  REQUESTING PHYSICIAN:  Lin Landsman, [address],  [place] [address]  Chief Complaint  Patient presents with  . Hypertension  . Results    HPI  Denise Becker is a 69 y.o. female who presents to the office with a chief complaint of " blood pressure management and review test results Patient's past medical history and cardiovascular risk factors include: History of breast cancer status post mastectomy/radiation/chemotherapy, hypertension, postmenopausal female, advanced age, obesity due to excess calories.  As per the referral patient is referred to the office for abnormalities of gait and mobility.  However, patient states that that its secondary to hypertension and lower extremity swelling.  Due to her lower extremity swelling patient was recommended to discontinue Norvasc at last office visit.  Patient states that she has not noticed any significant change after stopping amlodipine.  However her lower extremity swelling has at least gotten worse.  In addition, her lower extremity duplex is negative for DVT and no significant evidence of chronic venous insufficiency.  Benign essential hypertension: At the last visit amlodipine was discontinued and she was started on hydrochlorothiazide 25 mg p.o. daily.  Patient states that she is tolerating medication well without any side effects or intolerances.  Patient systolic blood pressures have improved but not back to baseline.  Medications reconciled.  Home blood pressure log reviewed.  Reeducated on the importance of a low-salt diet.  FUNCTIONAL STATUS: No structured exercise program or daily routine.    ALLERGIES: Allergies  Allergen Reactions  . Ciprofloxacin Diarrhea    Patient  developed severe diarrhea on cipro  . Penicillins Rash    MEDICATION LIST PRIOR TO VISIT: Current Meds  Medication Sig  . COMBIGAN 0.2-0.5 % ophthalmic solution   . dorzolamide (TRUSOPT) 2 % ophthalmic solution Place 1 drop into the right eye 2 (two) times daily.  . hydrochlorothiazide (HYDRODIURIL) 25 MG tablet Take 1 tablet (25 mg total) by mouth daily.  Marland Kitchen losartan (COZAAR) 100 MG tablet Take 1 tablet (100 mg total) by mouth daily.  Marland Kitchen LUMIGAN 0.01 % SOLN   . RHOPRESSA 0.02 % SOLN Place 1 drop into the right eye at bedtime.  . tamoxifen (NOLVADEX) 20 MG tablet Take 1 tablet (20 mg total) by mouth daily.     PAST MEDICAL HISTORY: Past Medical History:  Diagnosis Date  . Anxiety    anxiousabout impending surgery- loss of breast  . Arthritis    "joints hurt q now and then" (10/25/2013)  . Breast cancer of upper-outer quadrant of right female breast ([place]) 03/10/2013  . Depression    history of depression after son died in the 2000's  . GERD (gastroesophageal reflux disease)    "just chemo related"  . H/O hiatal hernia   . Hypertension   . Radiation 12/08/13-01/17/14   Right chest wall 50.4 Gy  . Spontaneous dislocation of shoulder [phone]   "right; a few times over the years"    PAST SURGICAL HISTORY: Past Surgical History:  Procedure Laterality Date  . ABDOMINAL HYSTERECTOMY  1990's  . BREAST BIOPSY Right 02/2013  . CESAREAN SECTION  1980  . MASTECTOMY COMPLETE / SIMPLE W/ SENTINEL NODE BIOPSY Right 10/25/2013  . MASTECTOMY W/ SENTINEL NODE BIOPSY Right 10/25/2013   Procedure:  RIGHT TOTAL MASTECTOMY WITH SENTINEL LYMPH NODE BIOPSY;  Surgeon: Adin Hector, MD;  Location: [facility];  Service: General;  Laterality: Right;  . MULTIPLE TOOTH EXTRACTIONS Bilateral 2011-2014 X 6  . PORT A CATH REVISION Right 04/29/2013   Procedure: PORT A CATH REVISION;  Surgeon: Adin Hector, MD;  Location: [facility];  Service: General;  Laterality: Right;  . PORT-A-CATH  REMOVAL  10/25/2013  . PORT-A-CATH REMOVAL N/A 10/25/2013   Procedure: REMOVAL PORT-A-CATH;  Surgeon: Adin Hector, MD;  Location: [facility];  Service: General;  Laterality: N/A;  . PORTACATH PLACEMENT Right 04/15/2013   Procedure: INSERTION PORT-A-CATH;  Surgeon: Adin Hector, MD;  Location: [facility];  Service: General;  Laterality: Right;  . TONSILLECTOMY    . WISDOM TOOTH EXTRACTION      FAMILY HISTORY: The patient family history includes Bowel Disease in her mother; Cancer in her father; Heart attack in her maternal uncle; Heart disease in her maternal grandfather, maternal grandmother, and maternal uncle; Hypertension in her mother; Lung disease in her father.  SOCIAL HISTORY:  The patient  reports that she quit smoking about 18 years ago. Her smoking use included cigarettes. She has a 0.75 pack-year smoking history. She has never used smokeless tobacco. She reports current alcohol use. She reports that she does not use drugs.  REVIEW OF SYSTEMS: Review of Systems  Constitutional: Negative for chills and fever.  HENT: Negative for hoarse voice and nosebleeds.   Eyes: Negative for discharge, double vision and pain.  Cardiovascular: Positive for leg swelling. Negative for chest pain, claudication, dyspnea on exertion, near-syncope, orthopnea, palpitations, paroxysmal nocturnal dyspnea and syncope.  Respiratory: Negative for hemoptysis and shortness of breath.   Musculoskeletal: Positive for arthritis. Negative for muscle cramps and myalgias.  Gastrointestinal: Negative for abdominal pain, constipation, diarrhea, hematemesis, hematochezia, melena, nausea and vomiting.  Neurological: Negative for dizziness and light-headedness.    PHYSICAL EXAM: Vitals with BMI 10/18/2019 09/23/2019 09/23/2019  Height '5\' 1"'  - '5\' 1"'   Weight 214 lbs - 208 lbs  BMI 85.27 - 78.24  Systolic 235 361 443  Diastolic 68 87 92  Pulse 59 76 75   CONSTITUTIONAL: Appears older than stated age,  hemodynamically stable, no acute distress.    SKIN: Skin is warm and dry. No rash noted. No cyanosis. No pallor. No jaundice HEAD: Normocephalic and atraumatic.  EYES: No scleral icterus MOUTH/THROAT: Moist oral membranes.  NECK: No JVD present. No thyromegaly noted. No carotid bruits  LYMPHATIC: No visible cervical adenopathy.  CHEST Normal respiratory effort. No intercostal retractions.  Right mastectomy LUNGS: Clear to auscultation bilaterally.  No stridor. No wheezes. No rales.  CARDIOVASCULAR: Regular, positive S1-S2, no murmurs rubs or gallops appreciated ABDOMINAL: Obese, soft, nontender, nondistended, positive bowel sounds in all 4 quadrants.  No apparent ascites.  EXTREMITIES: Trace nonpitting peripheral edema.  HEMATOLOGIC: No significant bruising NEUROLOGIC: Oriented to person, place, and time. Nonfocal. Normal muscle tone.  PSYCHIATRIC: Normal mood and affect. Normal behavior. Cooperative  CARDIAC DATABASE: EKG: 09/23/2019 normal sinus rhythm, 62 bpm, normal axis, nonspecific T wave abnormality.  Echocardiogram: 03/29/2013: LVEF 55-60%, normal wall motion, no regional wall motion abnormalities, average global longitudinal strain -18.6%.   09/29/2019: LVEF 55%, Mild LVH, Grade II diastolic dysfunction, mildly LAE.   Stress Testing: None  Heart Catheterization: None  Lower Extremity Venous Duplex 09/29/2019:  No evidence of deep vein thrombosis of the lower extremities with normal venous return.   LABORATORY DATA: CBC Latest  Ref Rng & Units 08/18/2019 08/18/2019 11/08/2018  WBC 4.0 - 10.5 K/uL - 7.3 6.2  Hemoglobin 12.0 - 15.0 g/dL 16.7(H) 15.1(H) 14.1  Hematocrit 36 - 46 % 49.0(H) 47.2(H) 44.1  Platelets 150 - 400 K/uL - 269 267   BMP Recent Labs    11/08/18 1437 11/08/18 1437 08/18/19 1601 08/18/19 1616 10/10/19 1507  NA 142   < > 138 141 139  K 3.2*   < > 3.6 3.6 3.5  CL 107   < > 103 104 102  CO2 27  --  26  --  24  GLUCOSE 89   < > 99 95 80  BUN 15   <  > '19 22 12  ' CREATININE 1.01*   < > 1.15* 1.20* 0.97  CALCIUM 9.4  --  9.4  --  9.5  GFRNONAA 57*  --  49*  --  60  GFRAA >60  --  56*  --  69   < > = values in this interval not displayed.   External Labs: Collected: 08/23/2019 Creatinine 0.87 mg/dL. eGFR: 79 mL/min per 1.73 m Lipid profile: Total cholesterol 183, triglycerides 62, HDL 56, LDL 113, non-HDL 127 TSH: 2.46   IMPRESSION:    ICD-10-CM   1. Essential hypertension, benign  I10 isosorbide-hydrALAZINE (BIDIL) 20-37.5 MG tablet  2. Leg swelling  M79.89   3. Former smoker  Z87.891   4. Hx of breast cancer  Z85.3   5. Class 2 obesity due to excess calories without serious comorbidity with body mass index (BMI) of 39.0 to 39.9 in adult  E66.09    Z68.39      RECOMMENDATIONS: Denise Becker is a 69 y.o. female whose past medical history and cardiac risk factors include: History of breast cancer status post mastectomy/radiation/chemotherapy, hypertension, postmenopausal female, advanced age, obesity due to excess calories.  Benign essential hypertension, uncontrolled: Improving.  Medications reconciled.  Start BiDil 20/37.5 mg p.o. 3 times daily  Patient is asked to call the office if her systolic blood pressure consistently greater than 140 mmHg.  Patient will be enrolled into principal care management for ambulatory blood pressure monitoring.  Educated on the importance of reducing salt/seasoning in her daily foods, reducing the amount of canned and frozen foods.  Given her history of breast cancer and chemotherapy would recommend a repeat echocardiogram to evaluate left ventricular function.  Lower extremity swelling: Stable.  Lower extremity venous duplex results reviewed with the patient.    Educated on importance of a low-salt diet.  Obesity, due to excess calories: Body mass index is 40.43 kg/m. . I reviewed with the patient the importance of diet, regular physical activity/exercise, weight loss.    . Patient is educated on increasing physical activity gradually as tolerated.  With the goal of moderate intensity exercise for 30 minutes a day 5 days a week.  Former smoker: Educated on the importance of continued smoking cessation.  FINAL MEDICATION LIST END OF ENCOUNTER: Meds ordered this encounter  Medications  . isosorbide-hydrALAZINE (BIDIL) 20-37.5 MG tablet    Sig: Take 1 tablet by mouth 3 (three) times daily.    Dispense:  90 tablet    Refill:  2    There are no discontinued medications.   Current Outpatient Medications:  .  COMBIGAN 0.2-0.5 % ophthalmic solution, , Disp: , Rfl:  .  dorzolamide (TRUSOPT) 2 % ophthalmic solution, Place 1 drop into the right eye 2 (two) times daily., Disp: , Rfl:  .  hydrochlorothiazide (  HYDRODIURIL) 25 MG tablet, Take 1 tablet (25 mg total) by mouth daily., Disp: 90 tablet, Rfl: 0 .  losartan (COZAAR) 100 MG tablet, Take 1 tablet (100 mg total) by mouth daily., Disp: 7 tablet, Rfl: 0 .  LUMIGAN 0.01 % SOLN, , Disp: , Rfl:  .  RHOPRESSA 0.02 % SOLN, Place 1 drop into the right eye at bedtime., Disp: , Rfl:  .  tamoxifen (NOLVADEX) 20 MG tablet, Take 1 tablet (20 mg total) by mouth daily., Disp: 30 tablet, Rfl: 3 .  isosorbide-hydrALAZINE (BIDIL) 20-37.5 MG tablet, Take 1 tablet by mouth 3 (three) times daily., Disp: 90 tablet, Rfl: 2  No orders of the defined types were placed in this encounter.   There are no Patient Instructions on file for this visit.  --Continue cardiac medications as reconciled in final medication list. --Return in about 6 weeks (around 11/29/2019) for BP follow up.. Or sooner if needed. --Continue follow-up with your primary care physician regarding the management of your other chronic comorbid conditions.  Patient's questions and concerns were addressed to her satisfaction. She voices understanding of the instructions provided during this encounter.   This note was created using a voice recognition software as a  result there may be grammatical errors inadvertently enclosed that do not reflect the nature of this encounter. Every attempt is made to correct such errors.  Rex Kras, [place], [facility]  Pager: [phone] Office: [phone]

## 2019-10-20 ENCOUNTER — Telehealth: Payer: Self-pay

## 2019-10-20 ENCOUNTER — Other Ambulatory Visit: Payer: Self-pay | Admitting: Cardiology

## 2019-10-20 DIAGNOSIS — I10 Essential (primary) hypertension: Secondary | ICD-10-CM

## 2019-10-20 MED ORDER — ISOSORBIDE DINITRATE 20 MG PO TABS: 20.0000 mg | ORAL_TABLET | Freq: Three times a day (TID) | ORAL | 0 refills | Status: DC

## 2019-10-20 MED ORDER — ISOSORBIDE DINITRATE 20 MG PO TABS: 20.0000 mg | ORAL_TABLET | Freq: Three times a day (TID) | ORAL | 1 refills | Status: DC

## 2019-10-20 MED ORDER — HYDRALAZINE HCL 25 MG PO TABS: 25.0000 mg | ORAL_TABLET | Freq: Three times a day (TID) | ORAL | 0 refills | Status: DC

## 2019-10-20 MED ORDER — HYDRALAZINE HCL 25 MG PO TABS: ORAL_TABLET | ORAL | 1 refills | Status: DC

## 2019-10-20 NOTE — Progress Notes (Signed)
Discontinue BiDil, per patient it is cost prohibitive.  Start hydralazine 25 mg p.o. 3 times daily. Start Isordil 20 mg p.o. 3 times daily. Prescription sent to her pharmacy.  We will reevaluate at her next office visit.   Medications Discontinued During This Encounter  Medication Reason  . isosorbide-hydrALAZINE (BIDIL) 20-37.5 MG tablet Cost of medication   Meds ordered this encounter  Medications  . hydrALAZINE (APRESOLINE) 25 MG tablet    Sig: Take 1 tablet (25 mg total) by mouth 3 (three) times daily.    Dispense:  90 tablet    Refill:  0  . isosorbide dinitrate (ISORDIL) 20 MG tablet    Sig: Take 1 tablet (20 mg total) by mouth 3 (three) times daily.    Dispense:  90 tablet    Refill:  0    Mechele [facility]  Pager: [phone] Office: [phone]

## 2019-10-20 NOTE — Telephone Encounter (Signed)
Alternative has been sent to her pharmacy on [address]

## 2019-10-20 NOTE — Telephone Encounter (Signed)
Received a call from patient in regards to medication changes. Patient states her BIDIL is too expensive, so she would like to know what other options she could possibly take. Please advise. Thanks!

## 2019-11-08 ENCOUNTER — Other Ambulatory Visit: Payer: Self-pay | Admitting: *Deleted

## 2019-11-08 DIAGNOSIS — Z170 Estrogen receptor positive status [ER+]: Secondary | ICD-10-CM

## 2019-11-08 NOTE — Progress Notes (Signed)
[place]  Telephone:([phone]) [phone] Fax:(336) [phone]    ID: Denise Becker OB: Feb 28, 1951  MR#: [id number]  HWE#:[id number]  Patient Care Team: Lin Landsman, MD as PCP - General (Family Medicine) Lahoma Crocker, MD as Consulting Physician (Obstetrics and Gynecology) Lin Landsman, MD as Consulting Physician (Family Medicine) Carol Ada, MD as Consulting Physician (Gastroenterology) Ronnell Makarewicz, Virgie Dad, MD as Consulting Physician (Oncology) Fanny Skates, MD as Consulting Physician (General Surgery) Thea Silversmith, MD as Referring Physician (Radiation Oncology) OTHER MD:    CHIEF COMPLAINT: Right breast cancer  CURRENT TREATMENT: Tamoxifen   INTERVAL HISTORY: Denise Becker returns today for follow-up of her estrogen receptor positive breast cancer.   She continues on tamoxifen. She has some occasional hot flashes, but this is unchanged from menopause.    Her most recent mammogram on 10/14/2019 showed the breast density to be category B.  There was no evidence of recurrent disease.  She had a head CT without contrast 09/06/2019 when she presented with vertigo and altered mental status.  There were no abnormal findings.  REVIEW OF SYSTEMS: Denise Becker tells me she slipped and fell and she is now using a walker for long walks and a cane for short walks.  She says her sense of balance is very poor.  She received the Pfizer vaccine and tolerated it well.  She saw a neurologist as well as a cardiologist and "all the tests came back fine".  A detailed review of systems today was otherwise stable   BREAST CANCER HISTORY: From the initial intake note 03/16/2013:  Denise Becker palpated a change in her right breast sometime in August or September 2014. She was aware that this might be significant, but waited on meds until she saw her gynecologist for a routine visit. Dr. Delsa Sale immediately he set her up for right mammography and ultrasonography performed  03/03/2013. Mammography found an area of density in the upper outer quadrant of the right breast measuring 5 cm in the with right nipple retraction. There was no evidence of skin thickening. On exam there was a firm palpable mass in the superior right breast extending from 1:00 to 9:00. The right nipple was completely retracted. There was no skin thickening. The right axilla was benign by palpation. Ultrasound showed a prominent irregular hypoechoic mass larger than the ultrasound screen. Ultrasound of the right axilla showed 2 adjacent small lymph nodes with diffusely thickened cortices, the largest measuring 1.1 cm.  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 There was no HER-2 amplification with a ratio of 1.46 by C[facility] and an average copy number of 2.85.  On 03/11/2013 the patient underwent bilateral breast MRI. This showed an irregular enhancing mass in the central to upper right breast with nipple retraction measuring in total 6.8 cm. There were several adjacent enhancing nodules as well. There was no involvement of the pectoralis muscle. There were no definite morphologically abnormal right axillary lymph nodes there was no definite internal mammary or lymphadenopathy. Left breast was unremarkable.  The patient's subsequent history is as detailed below    PAST MEDICAL HISTORY: Past Medical History:  Diagnosis Date  . Anxiety    anxiousabout impending surgery- loss of breast  . Arthritis    "joints hurt q now  and then" (10/25/2013)  . Breast cancer of upper-outer quadrant of right female breast ([place]) 03/10/2013  . Depression    history of depression after son died in the 2000's  . GERD  (gastroesophageal reflux disease)    "just chemo related"  . H/O hiatal hernia   . Hypertension   . Radiation 12/08/13-01/17/14   Right chest wall 50.4 Gy  . Spontaneous dislocation of shoulder [phone]   "right; a few times over the years"    PAST SURGICAL HISTORY: Past Surgical History:  Procedure Laterality Date  . ABDOMINAL HYSTERECTOMY  1990's  . BREAST BIOPSY Right 02/2013  . CESAREAN SECTION  1980  . MASTECTOMY COMPLETE / SIMPLE W/ SENTINEL NODE BIOPSY Right 10/25/2013  . MASTECTOMY W/ SENTINEL NODE BIOPSY Right 10/25/2013   Procedure: RIGHT TOTAL MASTECTOMY WITH SENTINEL LYMPH NODE BIOPSY;  Surgeon: Adin Hector, MD;  Location: [facility];  Service: General;  Laterality: Right;  . MULTIPLE TOOTH EXTRACTIONS Bilateral 2011-2014 X 6  . PORT A CATH REVISION Right 04/29/2013   Procedure: PORT A CATH REVISION;  Surgeon: Adin Hector, MD;  Location: [facility];  Service: General;  Laterality: Right;  . PORT-A-CATH REMOVAL  10/25/2013  . PORT-A-CATH REMOVAL N/A 10/25/2013   Procedure: REMOVAL PORT-A-CATH;  Surgeon: Adin Hector, MD;  Location: [facility];  Service: General;  Laterality: N/A;  . PORTACATH PLACEMENT Right 04/15/2013   Procedure: INSERTION PORT-A-CATH;  Surgeon: Adin Hector, MD;  Location: [facility];  Service: General;  Laterality: Right;  . TONSILLECTOMY    . WISDOM TOOTH EXTRACTION      FAMILY HISTORY Family History  Problem Relation Age of Onset  . Hypertension Mother   . Bowel Disease Mother   . Lung disease Father   . Cancer Father   . Heart disease Maternal Grandmother   . Heart disease Maternal Grandfather   . Heart disease Maternal Uncle   . Heart attack Maternal Uncle   the patient's mother,Denise Becker, had a lung mass but apparently died from unrelated causes (she was followed by Dr. Earlie Server here). She was 70 years old. The patient has little information about her father. The patient had 2 brothers, no  sisters. There is no history of breast or ovarian cancer in the family to her knowledge.    GYNECOLOGIC HISTORY:   (Reviewed 08/23/2013) Menarche age 55, first live birth age 12, the patient is GX P1. She went through the change of life approximately 1999. She did not take hormone replacement.   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 She was on disability throughout her treatment, later retired. She is single. The patient's son Denise Becker died at the age of 71 with acute leukemia. The patient has no grandchildren.    ADVANCED DIRECTIVES: Not in place, but the patient was given the appropriate documents to complete and notarize on her 03/16/2013 visit. She intends to name her cousin, Sharrie Rothman, as her healthcare power of attorney. She can be reached at [place]:   Social History   Tobacco Use  . Smoking status: Former Smoker    Packs/day: 0.25    Years: 3.00    Pack years: 0.75    Types: Cigarettes    Quit date: 04/12/2001    Years since quitting: 18.5  . Smokeless tobacco: Never Used  . Tobacco comment: "casual smoker; pack would go stale on me"  Vaping Use  . Vaping Use: Never used  Substance Use Topics  . Alcohol use: Yes    Comment: 10/25/2013 "might have a glass of wine a few times/year"  . Drug use: No     Colonoscopy: Not on file  PAP: UTD/Dr. Delsa Sale  Bone density:  Never  Lipid panel:  Dec 2014/Dr. Jackson-Moore   Allergies  Allergen Reactions  . Ciprofloxacin Diarrhea    Patient developed severe diarrhea on cipro  . Penicillins Rash    Current Outpatient Medications  Medication Sig Dispense Refill  . COMBIGAN 0.2-0.5 % ophthalmic solution     . dorzolamide (TRUSOPT) 2 % ophthalmic solution Place 1 drop into the right eye 2 (two) times daily.    . hydrALAZINE (APRESOLINE) 25 MG tablet TAKE 1 TABLET(25 MG) BY MOUTH THREE TIMES DAILY 270 tablet 1    . hydrochlorothiazide (HYDRODIURIL) 25 MG tablet Take 1 tablet (25 mg total) by mouth daily. 90 tablet 0  . isosorbide dinitrate (ISORDIL) 20 MG tablet Take 1 tablet (20 mg total) by mouth 3 (three) times daily. 270 tablet 1  . losartan (COZAAR) 100 MG tablet Take 1 tablet (100 mg total) by mouth daily. 7 tablet 0  . LUMIGAN 0.01 % SOLN     . RHOPRESSA 0.02 % SOLN Place 1 drop into the right eye at bedtime.    . tamoxifen (NOLVADEX) 20 MG tablet Take 1 tablet (20 mg total) by mouth daily. 30 tablet 3   No current facility-administered medications for this visit.   Objective:  African American woman who appears younger than stated age  38:   11/09/19 1406  BP: 116/81  Pulse: 81  Resp: 20  Temp: (!) 97.2 F (36.2 C)  SpO2: 99%   Filed Weights   11/09/19 1406  Weight: 212 lb 14.4 oz (96.6 kg)   Body mass index is 40.23 kg/m.  ECOG: 1  Sclerae unicteric, EOMs intact Wearing a mask No cervical or supraclavicular adenopathy Lungs no rales or rhonchi Heart regular rate and rhythm Abd soft, nontender, positive bowel sounds MSK no focal spinal tenderness, no upper extremity lymphedema Neuro: nonfocal, well oriented, appropriate affect Breasts: The right breast is status post mastectomy and radiation.  There is still hyperpigmentation.  There is no evidence of local recurrence.  The left breast is benign.  Both axillae are benign.   LAB RESULTS:   Lab Results  Component Value Date   WBC 8.7 11/09/2019   NEUTROABS 4.5 11/09/2019   HGB 13.9 11/09/2019   HCT 41.9 11/09/2019   MCV 85.3 11/09/2019   PLT 246 11/09/2019      Chemistry      Component Value Date/Time   NA 140 11/09/2019 1346   NA 139 10/10/2019 1507   NA 141 07/29/2016 1120   K 3.4 (L) 11/09/2019 1346   K 3.5 07/29/2016 1120   CL 105 11/09/2019 1346   CO2 26 11/09/2019 1346   CO2 27 07/29/2016 1120   BUN 16 11/09/2019 1346   BUN 12 10/10/2019 1507   BUN 13.1 07/29/2016 1120   CREATININE 1.01 (H)  11/09/2019 1346   CREATININE 1.0 07/29/2016 1120      Component Value Date/Time   CALCIUM 9.9 11/09/2019 1346   CALCIUM 9.8 07/29/2016 1120   ALKPHOS 57 11/09/2019 1346   ALKPHOS 65 07/29/2016 1120   AST 13 (L) 11/09/2019 1346   AST 13 07/29/2016 1120   ALT 14 11/09/2019 1346   ALT 17 07/29/2016 1120  BILITOT 0.4 11/09/2019 1346   BILITOT 0.37 07/29/2016 1120       STUDIES: MM 3D SCREEN BREAST UNI LEFT  Result Date: 10/18/2019 CLINICAL DATA:  Screening. EXAM: DIGITAL SCREENING UNILATERAL LEFT MAMMOGRAM WITH CAD AND TOMO COMPARISON:  Previous exam(s). ACR Breast Density Category b: There are scattered areas of fibroglandular density. FINDINGS: There are no findings suspicious for malignancy. Images were processed with CAD. IMPRESSION: No mammographic evidence of malignancy. A result letter of this screening mammogram will be mailed directly to the patient. RECOMMENDATION: Screening mammogram in one year. (Code:SM-B-01Y) BI-RADS CATEGORY  1: Negative. Electronically Signed   By: Marin Olp M.D.   On: 10/18/2019 09:53     ASSESSMENT: 69 y.o. [place] woman   (1)  status post right breast biopsy 03/04/2013 for a clinical T3 N1, stage IIIA invasive ductal carcinoma, grade 3, estrogen receptor 100% positive, progesterone receptor 100% positive, with an MIB-1 of 20% and no HER-2 amplification.  (2) right axillary lymph node biopsy 03/04/2013 was negative  (3) neoadjuvant chemotherapy with docetaxel, doxorubicin and cyclophosphamide x6 cycles completed 08/23/2013  (4) status post right total mastectomy and right axillary sentinel node biopsy 10/25/2013 for a residual ypT3 ypN0 invasive lobular carcinoma, again estrogen and progesterone receptor positive and HER-2 nonamplified; with negative margins  (5) adjuvant radiation 12/08/2013-01/17/2014:  Right chest wall/50.4 Pearline Cables @ 1.8 Gray per fraction x 28 fractions  (6) started tamoxifen 02/28/2014--decided against switching to  anastrozole  (7) large ventral hernia   PLAN: Denise Becker is now a little over 6 years out from definitive surgery for her breast cancer with no evidence of disease recurrence.  This is very favorable.  She continues on tamoxifen and the plan is to stay on the drug for a total of 10 years.  She is tolerating it well.  She does not like being alone at home, does not find that she is as active as she would like to be.  She is anxious.  I suggested she volunteer at her church and she does that a little bit.  We are going to start venlafaxine very low dose and see if that is helpful to her  Otherwise I encouraged her to increase her activity level as much as she can tolerate.  She will see [name] again in 1 year.  She knows to call for any other issue that may develop before then  Total encounter time 20 minutes.*  Shaymus Eveleth, Virgie Dad, MD  11/09/19 2:27 PM Medical Oncology and Hematology [facility] [place], [place] [address] Tel. [phone]    Fax. [phone]   I, Wilburn Mylar, am acting as scribe for Dr. Virgie Dad. Cambria Osten.  I, Lurline Del MD, have reviewed the above documentation for accuracy and completeness, and I agree with the above.    *Total Encounter Time as defined by the Centers for Medicare and Medicaid Services includes, in addition to the face-to-face time of a patient visit (documented in the note above) non-face-to-face time: obtaining and reviewing outside history, ordering and reviewing medications, tests or procedures, care coordination (communications with other health care professionals or caregivers) and documentation in the medical record.

## 2019-11-09 ENCOUNTER — Inpatient Hospital Stay: Payer: Medicare HMO

## 2019-11-09 ENCOUNTER — Other Ambulatory Visit: Payer: Self-pay

## 2019-11-09 ENCOUNTER — Inpatient Hospital Stay: Payer: Medicare HMO | Attending: Oncology | Admitting: Oncology

## 2019-11-09 VITALS — BP 116/81 | HR 81 | Temp 97.2°F | Resp 20 | Ht 61.0 in | Wt 212.9 lb

## 2019-11-09 DIAGNOSIS — K219 Gastro-esophageal reflux disease without esophagitis: Secondary | ICD-10-CM | POA: Diagnosis not present

## 2019-11-09 DIAGNOSIS — C50411 Malignant neoplasm of upper-outer quadrant of right female breast: Secondary | ICD-10-CM

## 2019-11-09 DIAGNOSIS — F419 Anxiety disorder, unspecified: Secondary | ICD-10-CM | POA: Insufficient documentation

## 2019-11-09 DIAGNOSIS — Z79810 Long term (current) use of selective estrogen receptor modulators (SERMs): Secondary | ICD-10-CM | POA: Diagnosis not present

## 2019-11-09 DIAGNOSIS — Z79899 Other long term (current) drug therapy: Secondary | ICD-10-CM | POA: Diagnosis not present

## 2019-11-09 DIAGNOSIS — Z87891 Personal history of nicotine dependence: Secondary | ICD-10-CM | POA: Insufficient documentation

## 2019-11-09 DIAGNOSIS — Z923 Personal history of irradiation: Secondary | ICD-10-CM | POA: Diagnosis not present

## 2019-11-09 DIAGNOSIS — I10 Essential (primary) hypertension: Secondary | ICD-10-CM | POA: Diagnosis not present

## 2019-11-09 DIAGNOSIS — F329 Major depressive disorder, single episode, unspecified: Secondary | ICD-10-CM | POA: Insufficient documentation

## 2019-11-09 DIAGNOSIS — Z170 Estrogen receptor positive status [ER+]: Secondary | ICD-10-CM

## 2019-11-09 DIAGNOSIS — C50111 Malignant neoplasm of central portion of right female breast: Secondary | ICD-10-CM

## 2019-11-09 LAB — CBC WITH DIFFERENTIAL (CANCER CENTER ONLY)
Abs Immature Granulocytes: 0.03 10*3/uL (ref 0.00–0.07)
Basophils Absolute: 0.1 10*3/uL (ref 0.0–0.1)
Basophils Relative: 1 %
Eosinophils Absolute: 0.2 10*3/uL (ref 0.0–0.5)
Eosinophils Relative: 2 %
HCT: 41.9 % (ref 36.0–46.0)
Hemoglobin: 13.9 g/dL (ref 12.0–15.0)
Immature Granulocytes: 0 %
Lymphocytes Relative: 36 %
Lymphs Abs: 3.1 10*3/uL (ref 0.7–4.0)
MCH: 28.3 pg (ref 26.0–34.0)
MCHC: 33.2 g/dL (ref 30.0–36.0)
MCV: 85.3 fL (ref 80.0–100.0)
Monocytes Absolute: 0.9 10*3/uL (ref 0.1–1.0)
Monocytes Relative: 10 %
Neutro Abs: 4.5 10*3/uL (ref 1.7–7.7)
Neutrophils Relative %: 51 %
Platelet Count: 246 10*3/uL (ref 150–400)
RBC: 4.91 MIL/uL (ref 3.87–5.11)
RDW: 15.2 % (ref 11.5–15.5)
WBC Count: 8.7 10*3/uL (ref 4.0–10.5)
nRBC: 0 % (ref 0.0–0.2)

## 2019-11-09 LAB — CMP (CANCER CENTER ONLY)
ALT: 14 U/L (ref 0–44)
AST: 13 U/L — ABNORMAL LOW (ref 15–41)
Albumin: 3.5 g/dL (ref 3.5–5.0)
Alkaline Phosphatase: 57 U/L (ref 38–126)
Anion gap: 9 (ref 5–15)
BUN: 16 mg/dL (ref 8–23)
CO2: 26 mmol/L (ref 22–32)
Calcium: 9.9 mg/dL (ref 8.9–10.3)
Chloride: 105 mmol/L (ref 98–111)
Creatinine: 1.01 mg/dL — ABNORMAL HIGH (ref 0.44–1.00)
GFR, Est AFR Am: >60 mL/min (ref >60)
GFR, Estimated: 57 mL/min — ABNORMAL LOW (ref >60)
Glucose, Bld: 118 mg/dL — ABNORMAL HIGH (ref 70–99)
Potassium: 3.4 mmol/L — ABNORMAL LOW (ref 3.5–5.1)
Sodium: 140 mmol/L (ref 135–145)
Total Bilirubin: 0.4 mg/dL (ref 0.3–1.2)
Total Protein: 8.6 g/dL — ABNORMAL HIGH (ref 6.5–8.1)

## 2019-11-09 MED ORDER — VENLAFAXINE HCL ER 37.5 MG PO CP24
37.5000 mg | ORAL_CAPSULE | Freq: Every day | ORAL | 4 refills | Status: DC
Start: 2019-11-09 — End: 2020-02-06

## 2019-11-09 MED ORDER — TAMOXIFEN CITRATE 20 MG PO TABS: 20.0000 mg | ORAL_TABLET | Freq: Every day | ORAL | 4 refills | Status: DC

## 2019-11-09 MED ORDER — TAMOXIFEN CITRATE 20 MG PO TABS: 20.0000 mg | ORAL_TABLET | Freq: Every day | ORAL | 3 refills | Status: DC

## 2019-11-10 ENCOUNTER — Telehealth: Payer: Self-pay | Admitting: Oncology

## 2019-11-10 NOTE — Telephone Encounter (Signed)
Scheduled appts per 8/11 los. Pt confirmed appt date and time.

## 2019-11-29 ENCOUNTER — Ambulatory Visit: Payer: Medicare HMO | Admitting: Cardiology

## 2019-12-19 ENCOUNTER — Ambulatory Visit: Payer: Medicare HMO | Admitting: Neurology

## 2019-12-20 ENCOUNTER — Other Ambulatory Visit: Payer: Self-pay | Admitting: Cardiology

## 2019-12-20 DIAGNOSIS — I10 Essential (primary) hypertension: Secondary | ICD-10-CM

## 2020-02-02 ENCOUNTER — Other Ambulatory Visit: Payer: Self-pay

## 2020-02-02 DIAGNOSIS — I10 Essential (primary) hypertension: Secondary | ICD-10-CM

## 2020-02-06 ENCOUNTER — Other Ambulatory Visit: Payer: Self-pay

## 2020-02-06 MED ORDER — TAMOXIFEN CITRATE 20 MG PO TABS: 20.0000 mg | ORAL_TABLET | Freq: Every day | ORAL | 0 refills | Status: DC

## 2020-02-06 MED ORDER — VENLAFAXINE HCL ER 37.5 MG PO CP24: 37.5000 mg | ORAL_CAPSULE | Freq: Every day | ORAL | 0 refills | Status: DC

## 2020-02-08 ENCOUNTER — Other Ambulatory Visit: Payer: Self-pay

## 2020-02-08 MED ORDER — TAMOXIFEN CITRATE 20 MG PO TABS: 20.0000 mg | ORAL_TABLET | Freq: Every day | ORAL | 0 refills | Status: DC

## 2020-02-08 MED ORDER — VENLAFAXINE HCL ER 37.5 MG PO CP24: 37.5000 mg | ORAL_CAPSULE | Freq: Every day | ORAL | 0 refills | Status: DC

## 2020-04-25 ENCOUNTER — Other Ambulatory Visit: Payer: Self-pay | Admitting: *Deleted

## 2020-04-25 MED ORDER — VENLAFAXINE HCL ER 37.5 MG PO CP24: 37.5000 mg | ORAL_CAPSULE | Freq: Every day | ORAL | 0 refills | Status: DC

## 2020-04-26 ENCOUNTER — Other Ambulatory Visit: Payer: Self-pay | Admitting: Cardiology

## 2020-04-26 DIAGNOSIS — I10 Essential (primary) hypertension: Secondary | ICD-10-CM

## 2020-04-30 ENCOUNTER — Other Ambulatory Visit: Payer: Self-pay | Admitting: *Deleted

## 2020-04-30 MED ORDER — TAMOXIFEN CITRATE 20 MG PO TABS: 20.0000 mg | ORAL_TABLET | Freq: Every day | ORAL | 0 refills | Status: DC

## 2020-04-30 NOTE — Telephone Encounter (Signed)
Refill sent per pharmacy notification

## 2020-05-02 ENCOUNTER — Other Ambulatory Visit: Payer: Self-pay | Admitting: *Deleted

## 2020-05-02 MED ORDER — TAMOXIFEN CITRATE 20 MG PO TABS: 20.0000 mg | ORAL_TABLET | Freq: Every day | ORAL | 0 refills | Status: DC

## 2020-06-06 ENCOUNTER — Encounter: Payer: Self-pay | Admitting: Cardiology

## 2020-06-06 ENCOUNTER — Ambulatory Visit: Payer: Medicare HMO | Admitting: Cardiology

## 2020-06-06 ENCOUNTER — Other Ambulatory Visit: Payer: Self-pay

## 2020-06-06 VITALS — BP 148/80 | HR 82 | Temp 98.0°F | Resp 16 | Ht 61.0 in | Wt 205.0 lb

## 2020-06-06 DIAGNOSIS — I10 Essential (primary) hypertension: Secondary | ICD-10-CM

## 2020-06-06 DIAGNOSIS — E6609 Other obesity due to excess calories: Secondary | ICD-10-CM

## 2020-06-06 DIAGNOSIS — Z87891 Personal history of nicotine dependence: Secondary | ICD-10-CM

## 2020-06-06 DIAGNOSIS — Z6838 Body mass index (BMI) 38.0-38.9, adult: Secondary | ICD-10-CM

## 2020-06-06 DIAGNOSIS — Z853 Personal history of malignant neoplasm of breast: Secondary | ICD-10-CM

## 2020-06-06 NOTE — Progress Notes (Signed)
Date:  06/06/2020   ID:  Denise Becker, DOB October 21, 1950, MRN [id number]  PCP:  Lin Landsman, MD  Cardiologist:  Rex Kras, DO, [facility] (established care 09/23/2019)  Date: 06/06/20 Last Office Visit: 10/18/2019   Chief Complaint  Patient presents with  . Hypertension  . Follow-up    HPI  Denise Becker is a 70 y.o. female who presents to the office with a chief complaint of " blood pressure management." Patient's past medical history and cardiovascular risk factors include: History of breast cancer status post mastectomy/radiation/chemotherapy, hypertension, postmenopausal female, advanced age, obesity due to excess calories.  Patient is being followed by our practice for management of her benign essential hypertension.  Patient was last seen in the office in July 2021 and was asked to follow-up shortly thereafter.  However patient states that she forgot to follow-up.  Patient does not check her blood pressures on a regular basis.  But based on her memory her systolic blood pressure ranges between 409-811 mmHg and diastolic blood pressures range between 60-88 mmHg.  Her majority of the time her blood pressure averages around 130/79.  Medications reconciled.  Unfortunately, she continues to consume a high salt diet which includes canned foods and frozen vegetables as well.  Patient denies any chest pain or anginal equivalent. No hospitalizations or urgent care visits since the last office encounter.  FUNCTIONAL STATUS: No structured exercise program or daily routine.    ALLERGIES: Allergies  Allergen Reactions  . Ciprofloxacin Diarrhea    Patient developed severe diarrhea on cipro  . Penicillins Rash    MEDICATION LIST PRIOR TO VISIT: Current Meds  Medication Sig  . amLODipine (NORVASC) 10 MG tablet Take 10 mg by mouth daily.  . COMBIGAN 0.2-0.5 % ophthalmic solution   . dorzolamide (TRUSOPT) 2 % ophthalmic solution Place 1 drop into the right eye 2 (two)  times daily.  . hydrALAZINE (APRESOLINE) 25 MG tablet TAKE 1 TABLET THREE TIMES DAILY  . isosorbide dinitrate (ISORDIL) 20 MG tablet Take 1 tablet (20 mg total) by mouth 3 (three) times daily.  Marland Kitchen losartan (COZAAR) 100 MG tablet Take 1 tablet (100 mg total) by mouth daily.  . RHOPRESSA 0.02 % SOLN Place 1 drop into the right eye at bedtime.     PAST MEDICAL HISTORY: Past Medical History:  Diagnosis Date  . Anxiety    anxiousabout impending surgery- loss of breast  . Arthritis    "joints hurt q now and then" (10/25/2013)  . Breast cancer of upper-outer quadrant of right female breast ([place]) 03/10/2013  . Depression    history of depression after son died in the 2000's  . GERD (gastroesophageal reflux disease)    "just chemo related"  . H/O hiatal hernia   . Hypertension   . Radiation 12/08/13-01/17/14   Right chest wall 50.4 Gy  . Spontaneous dislocation of shoulder [phone]   "right; a few times over the years"    PAST SURGICAL HISTORY: Past Surgical History:  Procedure Laterality Date  . ABDOMINAL HYSTERECTOMY  1990's  . BREAST BIOPSY Right 02/2013  . CESAREAN SECTION  1980  . MASTECTOMY COMPLETE / SIMPLE W/ SENTINEL NODE BIOPSY Right 10/25/2013  . MASTECTOMY W/ SENTINEL NODE BIOPSY Right 10/25/2013   Procedure: RIGHT TOTAL MASTECTOMY WITH SENTINEL LYMPH NODE BIOPSY;  Surgeon: Adin Hector, MD;  Location: [place];  Service: General;  Laterality: Right;  . MULTIPLE TOOTH EXTRACTIONS Bilateral 2011-2014 X 6  . PORT A CATH REVISION Right 04/29/2013  Procedure: PORT A CATH REVISION;  Surgeon: Adin Hector, MD;  Location: [place];  Service: General;  Laterality: Right;  . PORT-A-CATH REMOVAL  10/25/2013  . PORT-A-CATH REMOVAL N/A 10/25/2013   Procedure: REMOVAL PORT-A-CATH;  Surgeon: Adin Hector, MD;  Location: [place];  Service: General;  Laterality: N/A;  . PORTACATH PLACEMENT Right 04/15/2013   Procedure: INSERTION PORT-A-CATH;  Surgeon: Adin Hector, MD;  Location: [place];  Service: General;  Laterality: Right;  . TONSILLECTOMY    . WISDOM TOOTH EXTRACTION      FAMILY HISTORY: The patient family history includes Bowel Disease in her mother; Cancer in her father; Heart attack in her maternal uncle; Heart disease in her maternal grandfather, maternal grandmother, and maternal uncle; Hypertension in her mother; Lung disease in her father.  SOCIAL HISTORY:  The patient  reports that she quit smoking about 19 years ago. Her smoking use included cigarettes. She has a 0.75 pack-year smoking history. She has never used smokeless tobacco. She reports current alcohol use. She reports that she does not use drugs.  REVIEW OF SYSTEMS: Review of Systems  Constitutional: Negative for chills and fever.  HENT: Negative for hoarse voice and nosebleeds.   Eyes: Negative for discharge, double vision and pain.  Cardiovascular: Positive for leg swelling. Negative for chest pain, claudication, dyspnea on exertion, near-syncope, orthopnea, palpitations, paroxysmal nocturnal dyspnea and syncope.  Respiratory: Negative for hemoptysis and shortness of breath.   Musculoskeletal: Positive for arthritis. Negative for muscle cramps and myalgias.  Gastrointestinal: Negative for abdominal pain, constipation, diarrhea, hematemesis, hematochezia, melena, nausea and vomiting.  Neurological: Negative for dizziness and light-headedness.    PHYSICAL EXAM: Vitals with BMI 06/06/2020 06/06/2020 11/09/2019  Height - '5\' 1"'  '5\' 1"'   Weight - 205 lbs 212 lbs 14 oz  BMI - 57.84 69.62  Systolic 952 841 324  Diastolic 80 86 81  Pulse 82 87 81   CONSTITUTIONAL: Appears older than stated age, hemodynamically stable, no acute distress.   Walks with a walker SKIN: Skin is warm and dry. No rash noted. No cyanosis. No pallor. No jaundice HEAD: Normocephalic and atraumatic.  EYES: No scleral icterus MOUTH/THROAT: Moist oral membranes.  NECK: No JVD present. No  thyromegaly noted. No carotid bruits  LYMPHATIC: No visible cervical adenopathy.  CHEST Normal respiratory effort. No intercostal retractions.  Right mastectomy LUNGS: Clear to auscultation bilaterally.  No stridor. No wheezes. No rales.  CARDIOVASCULAR: Regular, positive S1-S2, no murmurs rubs or gallops appreciated ABDOMINAL: Obese, soft, nontender, nondistended, positive bowel sounds in all 4 quadrants.  No apparent ascites.  EXTREMITIES: Trace nonpitting peripheral edema.  HEMATOLOGIC: No significant bruising NEUROLOGIC: Oriented to person, place, and time. Nonfocal. Normal muscle tone.  PSYCHIATRIC: Normal mood and affect. Normal behavior. Cooperative  CARDIAC DATABASE: EKG: 06/06/2020: Normal sinus rhythm, 80 bpm, normal axis, nonspecific ST-T changes lateral leads.  Echocardiogram: 03/29/2013: LVEF 55-60%, normal wall motion, no regional wall motion abnormalities, average global longitudinal strain -18.6%.   09/29/2019: LVEF 55%, Mild LVH, Grade II diastolic dysfunction, mildly LAE.   Stress Testing: None  Heart Catheterization: None  Lower Extremity Venous Duplex 09/29/2019:  No evidence of deep vein thrombosis of the lower extremities with normal venous return.   LABORATORY DATA: CBC Latest Ref Rng & Units 11/09/2019 08/18/2019 08/18/2019  WBC 4.0 - 10.5 K/uL 8.7 - 7.3  Hemoglobin 12.0 - 15.0 g/dL 13.9 16.7(H) 15.1(H)  Hematocrit 36.0 - 46.0 % 41.9 49.0(H) 47.2(H)  Platelets 150 -  400 K/uL 246 - 269   BMP Recent Labs    08/18/19 1601 08/18/19 1616 10/10/19 1507 11/09/19 1346  NA 138 141 139 140  K 3.6 3.6 3.5 3.4*  CL 103 104 102 105  CO2 26  --  24 26  GLUCOSE 99 95 80 118*  BUN '19 22 12 16  ' CREATININE 1.15* 1.20* 0.97 1.01*  CALCIUM 9.4  --  9.5 9.9  GFRNONAA 49*  --  60 57*  GFRAA 56*  --  69 >60   External Labs: Collected: 08/23/2019 Creatinine 0.87 mg/dL. eGFR: 79 mL/min per 1.73 m Lipid profile: Total cholesterol 183, triglycerides 62, HDL 56, LDL  113, non-HDL 127 TSH: 2.46   IMPRESSION:    ICD-10-CM   1. Essential hypertension, benign  I10 EKG 12-Lead  2. Former smoker  Z87.891   3. Hx of breast cancer  Z85.3   4. Class 2 obesity due to excess calories without serious comorbidity with body mass index (BMI) of 38.0 to 38.9 in adult  E66.09    Z68.38      RECOMMENDATIONS: Denise Becker is a 70 y.o. female whose past medical history and cardiac risk factors include: History of breast cancer status post mastectomy/radiation/chemotherapy, hypertension, postmenopausal female, advanced age, obesity due to excess calories.  Benign essential hypertension: .  Office blood pressures not well controlled.  Medications reconciled.  Patient states that she will keep a log of her blood pressures and bring it in at the next office visit.  She does not want to uptitrate her pharmacological therapy at this time.  I have also encouraged her to decrease salt in her diet.  Lower extremity swelling: Stable.  Lower extremity venous duplex results reviewed with the patient.    Educated on importance of a low-salt diet.  Obesity, due to excess calories: Body mass index is 38.73 kg/m. . I reviewed with the patient the importance of diet, regular physical activity/exercise, weight loss.   . Patient is educated on increasing physical activity gradually as tolerated.  With the goal of moderate intensity exercise for 30 minutes a day 5 days a week.  Former smoker: Educated on the importance of continued smoking cessation.  FINAL MEDICATION LIST END OF ENCOUNTER: No orders of the defined types were placed in this encounter.   Medications Discontinued During This Encounter  Medication Reason  . hydrochlorothiazide (HYDRODIURIL) 25 MG tablet Error  . LUMIGAN 0.01 % SOLN Error  . tamoxifen (NOLVADEX) 20 MG tablet Error  . venlafaxine XR (EFFEXOR-XR) 37.5 MG 24 hr capsule Error    Current Outpatient Medications:  .  amLODipine  (NORVASC) 10 MG tablet, Take 10 mg by mouth daily., Disp: , Rfl:  .  COMBIGAN 0.2-0.5 % ophthalmic solution, , Disp: , Rfl:  .  dorzolamide (TRUSOPT) 2 % ophthalmic solution, Place 1 drop into the right eye 2 (two) times daily., Disp: , Rfl:  .  hydrALAZINE (APRESOLINE) 25 MG tablet, TAKE 1 TABLET THREE TIMES DAILY, Disp: 270 tablet, Rfl: 1 .  isosorbide dinitrate (ISORDIL) 20 MG tablet, Take 1 tablet (20 mg total) by mouth 3 (three) times daily., Disp: 270 tablet, Rfl: 1 .  losartan (COZAAR) 100 MG tablet, Take 1 tablet (100 mg total) by mouth daily., Disp: 7 tablet, Rfl: 0 .  RHOPRESSA 0.02 % SOLN, Place 1 drop into the right eye at bedtime., Disp: , Rfl:   Orders Placed This Encounter  Procedures  . EKG 12-Lead    There are no Patient  Instructions on file for this visit.  --Continue cardiac medications as reconciled in final medication list. --Return in about 4 weeks (around 07/04/2020) for Follow up, BP. Or sooner if needed. --Continue follow-up with your primary care physician regarding the management of your other chronic comorbid conditions.  Patient's questions and concerns were addressed to her satisfaction. She voices understanding of the instructions provided during this encounter.   This note was created using a voice recognition software as a result there may be grammatical errors inadvertently enclosed that do not reflect the nature of this encounter. Every attempt is made to correct such errors.  Rex Kras, [place], [facility]  Pager: [phone] Office: [phone]

## 2020-06-20 ENCOUNTER — Telehealth: Payer: Self-pay | Admitting: Pharmacist

## 2020-06-20 NOTE — Telephone Encounter (Signed)
CARE PLAN ENTRY  06/20/2020 Name: Denise Becker MRN: [id number] DOB: Jun 04, 1950  Denise Becker is enrolled in Remote Patient Monitoring/Principle Care Monitoring.  Date of Enrollment: 09/23/19 Supervising physician: Rex Kras Indication: HTN  Remote Readings: Compliant and Avg BP: 138/81, HR:70  Next scheduled OV: 07/10/20  Pharmacist Clinical Goal(s):  Marland Kitchen Over the next 90 days, patient will demonstrate Improved medication adherence as evidenced by medication fill history . Over the next 90 days, patient will demonstrate improved understanding of prescribed medications and rationale for usage as evidenced by patient teach back . Over the next 90 days, patient will experience decrease in ED visits. ED visits in last 6 months = 0 . Over the next 90 days, patient will not experience hospital admission. Hospital Admissions in last 6 months = 0  Interventions: . Provider and Inter-disciplinary care team collaboration (see longitudinal plan of care) . Comprehensive medication review performed. . Discussed plans with patient for ongoing care management follow up and provided patient with direct contact information for care management team . Collaboration with provider re: medication management  Patient Self Care Activities:  . Self administers medications as prescribed . Attends all scheduled provider appointments . Performs ADL's independently . Performs IADL's independently  Allergies  Allergen Reactions  . Ciprofloxacin Diarrhea    Patient developed severe diarrhea on cipro  . Penicillins Rash   Outpatient Encounter Medications as of 06/20/2020  Medication Sig  . amLODipine (NORVASC) 10 MG tablet Take 10 mg by mouth daily.  . COMBIGAN 0.2-0.5 % ophthalmic solution   . dorzolamide (TRUSOPT) 2 % ophthalmic solution Place 1 drop into the right eye 2 (two) times daily.  . hydrALAZINE (APRESOLINE) 25 MG tablet TAKE 1 TABLET THREE TIMES DAILY  . isosorbide  dinitrate (ISORDIL) 20 MG tablet Take 1 tablet (20 mg total) by mouth 3 (three) times daily.  Marland Kitchen losartan (COZAAR) 100 MG tablet Take 1 tablet (100 mg total) by mouth daily.  . RHOPRESSA 0.02 % SOLN Place 1 drop into the right eye at bedtime.   No facility-administered encounter medications on file as of 06/20/2020.    Hypertension   BP goal is:  <130/80  Office blood pressures are  BP Readings from Last 3 Encounters:  06/06/20 (!) 148/80  11/09/19 116/81  10/18/19 (!) 150/68    Patient is currently uncontrolled on the following medications: amlodipine 10 mg, hydralazine 25 mg TID, isordil 20 mg TID, losartan 100 mg  Patient checks BP at home 3-5x per week  Patient home BP readings are ranging: 110-162/57-103  Patient has tried  these meds in the past: Olmesartan,   We discussed diet and exercise extensively  Plan  Continue current medications and control with diet and exercise   May consider increasing hydralazine and/or isordil dose if BP continues to remain elevated.  ______________ Visit Information SDOH (Social Determinants of Health) assessments performed: Yes.  Ms. Broman was given information about Principle Care Management/Remote Patient Monitoring services today including:  1. RPM/PCM service includes personalized support from designated clinical staff supervised by her physician, including individualized plan of care and coordination with other care providers 2. 24/7 contact phone numbers for assistance for urgent and routine care needs. 3. Standard insurance, coinsurance, copays and deductibles apply for principle care management only during months in which we provide at least 30 minutes of these services. Most insurances cover these services at 100%, however patients may be responsible for any copay, coinsurance and/or deductible if applicable. This service may help  you avoid the need for more expensive face-to-face services. 4. Only one practitioner may furnish  and bill the service in a calendar month. 5. The patient may stop PCM/RPM services at any time (effective at the end of the month) by phone call to the office staff.  Patient agreed to services and verbal consent obtained.   Manuela Schwartz, Pharm.D. [facility] [phone] Ext: 120

## 2020-07-08 ENCOUNTER — Other Ambulatory Visit: Payer: Self-pay | Admitting: Oncology

## 2020-07-10 ENCOUNTER — Ambulatory Visit: Payer: Medicare HMO | Admitting: Cardiology

## 2020-08-29 ENCOUNTER — Ambulatory Visit: Payer: Medicare HMO | Admitting: Cardiology

## 2020-09-19 ENCOUNTER — Other Ambulatory Visit: Payer: Self-pay | Admitting: Cardiology

## 2020-09-19 ENCOUNTER — Other Ambulatory Visit: Payer: Self-pay | Admitting: Oncology

## 2020-09-19 DIAGNOSIS — I10 Essential (primary) hypertension: Secondary | ICD-10-CM

## 2020-09-20 ENCOUNTER — Other Ambulatory Visit: Payer: Self-pay | Admitting: Oncology

## 2020-10-31 ENCOUNTER — Other Ambulatory Visit: Payer: Self-pay | Admitting: Oncology

## 2020-10-31 DIAGNOSIS — Z1231 Encounter for screening mammogram for malignant neoplasm of breast: Secondary | ICD-10-CM

## 2020-11-08 ENCOUNTER — Inpatient Hospital Stay: Payer: Medicare HMO | Admitting: Oncology

## 2020-11-08 ENCOUNTER — Inpatient Hospital Stay: Payer: Medicare HMO

## 2020-12-20 ENCOUNTER — Ambulatory Visit: Payer: Medicare HMO | Admitting: Cardiology

## 2020-12-20 ENCOUNTER — Ambulatory Visit
Admission: RE | Admit: 2020-12-20 | Discharge: 2020-12-20 | Disposition: A | Discharge status: Home / Self Care | Payer: Medicare HMO | Source: Ambulatory Visit | Attending: Oncology | Admitting: Oncology

## 2020-12-20 ENCOUNTER — Other Ambulatory Visit: Payer: Self-pay

## 2020-12-20 DIAGNOSIS — Z1231 Encounter for screening mammogram for malignant neoplasm of breast: Secondary | ICD-10-CM

## 2020-12-20 HISTORY — DX: Malignant neoplasm of unspecified site of unspecified female breast: C50.919

## 2021-01-02 ENCOUNTER — Ambulatory Visit: Payer: Medicare HMO | Admitting: Cardiology

## 2021-01-10 ENCOUNTER — Other Ambulatory Visit: Payer: Medicare HMO

## 2021-01-10 ENCOUNTER — Ambulatory Visit: Payer: Medicare HMO | Admitting: Oncology

## 2021-01-31 ENCOUNTER — Ambulatory Visit: Payer: Medicare HMO | Admitting: Cardiology

## 2021-01-31 NOTE — Progress Notes (Deleted)
Date:  01/31/2021   ID:  Denise Becker, DOB [birth year], MRN [id number]  PCP:  Lin Landsman, MD  Cardiologist:  Rex Kras, DO, [facility] (established care 09/23/2019)  *** 06/06/2020  No chief complaint on file.   HPI  Denise Becker is a 70 y.o. female who presents to the office with a chief complaint of " *** blood pressure management." Patient's past medical history and cardiovascular risk factors include: History of breast cancer status post mastectomy/radiation/chemotherapy, hypertension, postmenopausal female, advanced age, obesity due to excess calories.  Patient is being followed by our practice for management of her benign essential hypertension.  Last in the office in March 2022 and now presents for approximately 50-monthfollow-up for blood pressure management.  She is currently enrolled into critical care management records reviewed and findings noted below.  Patient states that her home blood pressures are ***.  During prior office visits patient noted that her salt intake is quite high on a regular basis.  She was educated on the importance of reducing salt with regards to management of her benign essential hypertension.  Since last visit patient states that she is making a conscious effort with regards to reducing salt intake.   ***  FUNCTIONAL STATUS: No structured exercise program or daily routine.    ALLERGIES: Allergies  Allergen Reactions   Ciprofloxacin Diarrhea    Patient developed severe diarrhea on cipro   Penicillins Rash    MEDICATION LIST PRIOR TO VISIT: No outpatient medications have been marked as taking for the 01/31/21 encounter (Appointment) with TRex Kras DO.     PAST MEDICAL HISTORY: Past Medical History:  Diagnosis Date   Anxiety    anxiousabout impending surgery- loss of breast   Arthritis    "joints hurt q now and then" (10/25/2013)   Breast cancer ([facility]    Breast cancer of upper-outer quadrant of right female breast ([facility]  03/10/2013   Depression    history of depression after son died in the 2000's   GERD (gastroesophageal reflux disease)    "just chemo related"   H/O hiatal hernia    Hypertension    Radiation 12/08/13-01/17/14   Right chest wall 50.4 Gy   Spontaneous dislocation of shoulder [phone]  "right; a few times over the years"    PAST SURGICAL HISTORY: Past Surgical History:  Procedure Laterality Date   ABDOMINAL HYSTERECTOMY  1990's   BREAST BIOPSY Right 02/2013   CESAREAN SECTION  1980   MASTECTOMY COMPLETE / SIMPLE W/ SENTINEL NODE BIOPSY Right 10/25/2013   MASTECTOMY W/ SENTINEL NODE BIOPSY Right 10/25/2013   Procedure: RIGHT TOTAL MASTECTOMY WITH SENTINEL LYMPH NODE BIOPSY;  Surgeon: HAdin Hector MD;  [place] [facility]  Service: General;  Laterality: Right;   MULTIPLE TOOTH EXTRACTIONS Bilateral 2011-2014 X 6   PORT A CATH REVISION Right 04/29/2013   Procedure: PORT A CATH REVISION;  Surgeon: HAdin Hector MD;  [place] [facility]  Service: General;  Laterality: Right;   PORT-A-CATH REMOVAL  10/25/2013   PORT-A-CATH REMOVAL N/A 10/25/2013   Procedure: REMOVAL PORT-A-CATH;  Surgeon: HAdin Hector MD;  [place] [facility]  Service: General;  Laterality: N/A;   PORTACATH PLACEMENT Right 04/15/2013   Procedure: INSERTION PORT-A-CATH;  Surgeon: HAdin Hector MD;  Location[place] [facility]  Service: General;  Laterality: Right;   TONSILLECTOMY     WISDOM TOOTH EXTRACTION      FAMILY HISTORY: The patient family history includes Bowel Disease  in her mother; Cancer in her father; Heart attack in her maternal uncle; Heart disease in her maternal grandfather, maternal grandmother, and maternal uncle; Hypertension in her mother; Lung disease in her father.  SOCIAL HISTORY:  The patient  reports that she quit smoking about 19 years ago. Her smoking use included cigarettes. She has a 0.75 pack-year smoking history. She has never used smokeless tobacco. She  reports current alcohol use. She reports that she does not use drugs.  REVIEW OF SYSTEMS: Review of Systems  Constitutional: Negative for chills and fever.  HENT:  Negative for hoarse voice and nosebleeds.   Eyes:  Negative for discharge, double vision and pain.  Cardiovascular:  Positive for leg swelling. Negative for chest pain, claudication, dyspnea on exertion, near-syncope, orthopnea, palpitations, paroxysmal nocturnal dyspnea and syncope.  Respiratory:  Negative for hemoptysis and shortness of breath.   Musculoskeletal:  Positive for arthritis. Negative for muscle cramps and myalgias.  Gastrointestinal:  Negative for abdominal pain, constipation, diarrhea, hematemesis, hematochezia, melena, nausea and vomiting.  Neurological:  Negative for dizziness and light-headedness.   PHYSICAL EXAM: Vitals with BMI 06/06/2020 06/06/2020 11/09/2019  Height - '5\' 1"'  '5\' 1"'   Weight - 205 lbs 212 lbs 14 oz  BMI - 16.10 96.04  Systolic 540 981 191  Diastolic 80 86 81  Pulse 82 87 81   CONSTITUTIONAL: Appears older than stated age, hemodynamically stable, no acute distress.   Walks with a walker SKIN: Skin is warm and dry. No rash noted. No cyanosis. No pallor. No jaundice HEAD: Normocephalic and atraumatic.  EYES: No scleral icterus MOUTH/THROAT: Moist oral membranes.  NECK: No JVD present. No thyromegaly noted. No carotid bruits  LYMPHATIC: No visible cervical adenopathy.  CHEST Normal respiratory effort. No intercostal retractions.  Right mastectomy LUNGS: Clear to auscultation bilaterally.  No stridor. No wheezes. No rales.  CARDIOVASCULAR: Regular, positive S1-S2, no murmurs rubs or gallops appreciated ABDOMINAL: Obese, soft, nontender, nondistended, positive bowel sounds in all 4 quadrants.  No apparent ascites.  EXTREMITIES: Trace nonpitting peripheral edema.  HEMATOLOGIC: No significant bruising NEUROLOGIC: Oriented to person, place, and time. Nonfocal. Normal muscle tone.  PSYCHIATRIC:  Normal mood and affect. Normal behavior. Cooperative  CARDIAC DATABASE: EKG: 06/06/2020: Normal sinus rhythm, 80 bpm, normal axis, nonspecific ST-T changes lateral leads.  Echocardiogram: 03/29/2013: LVEF 55-60%, normal wall motion, no regional wall motion abnormalities, average global longitudinal strain -18.6%.   09/29/2019: LVEF 55%, Mild LVH, Grade II diastolic dysfunction, mildly LAE.   Stress Testing: None  Heart Catheterization: None  Lower Extremity Venous Duplex  09/29/2019:  No evidence of deep vein thrombosis of the lower extremities with normal venous return.   LABORATORY DATA: CBC Latest Ref Rng & Units 11/09/2019 08/18/2019 08/18/2019  WBC 4.0 - 10.5 K/uL 8.7 - 7.3  Hemoglobin 12.0 - 15.0 g/dL 13.9 16.7(H) 15.1(H)  Hematocrit 36.0 - 46.0 % 41.9 49.0(H) 47.2(H)  Platelets 150 - 400 K/uL 246 - 269   BMP No results for input(s): NA, K, CL, CO2, GLUCOSE, BUN, CREATININE, CALCIUM, GFRNONAA, GFRAA in the last 8760 hours.  External Labs: Collected: 08/23/2019 Creatinine 0.87 mg/dL. eGFR: 79 mL/min per 1.73 m Lipid profile: Total cholesterol 183, triglycerides 62, HDL 56, LDL 113, non-HDL 127 TSH: 2.46   IMPRESSION:  No diagnosis found.    RECOMMENDATIONS: Jiana Lemaire is a 70 y.o. female whose past medical history and cardiac risk factors include: History of breast cancer status post mastectomy/radiation/chemotherapy, hypertension, postmenopausal female, advanced age, obesity due to excess calories.   ***  Benign essential hypertension: . Office blood pressures not well controlled. Medications reconciled. Patient states that she will keep a log of her blood pressures and bring it in at the next office visit. She does not want to uptitrate her pharmacological therapy at this time. I have also encouraged her to decrease salt in her diet.  Lower extremity swelling: Stable. Lower extremity venous duplex results reviewed with the patient.   Educated on  importance of a low-salt diet.  Obesity, due to excess calories: There is no height or weight on file to calculate BMI. I reviewed with the patient the importance of diet, regular physical activity/exercise, weight loss.   Patient is educated on increasing physical activity gradually as tolerated.  With the goal of moderate intensity exercise for 30 minutes a day 5 days a week.  Former smoker: Educated on the importance of continued smoking cessation.  FINAL MEDICATION LIST END OF ENCOUNTER: No orders of the defined types were placed in this encounter.   There are no discontinued medications.   Current Outpatient Medications:    amLODipine (NORVASC) 10 MG tablet, Take 10 mg by mouth daily., Disp: , Rfl:    COMBIGAN 0.2-0.5 % ophthalmic solution, , Disp: , Rfl:    dorzolamide (TRUSOPT) 2 % ophthalmic solution, Place 1 drop into the right eye 2 (two) times daily., Disp: , Rfl:    hydrALAZINE (APRESOLINE) 25 MG tablet, TAKE 1 TABLET THREE TIMES DAILY, Disp: 270 tablet, Rfl: 1   isosorbide dinitrate (ISORDIL) 20 MG tablet, Take 1 tablet (20 mg total) by mouth 3 (three) times daily., Disp: 270 tablet, Rfl: 1   losartan (COZAAR) 100 MG tablet, Take 1 tablet (100 mg total) by mouth daily., Disp: 7 tablet, Rfl: 0   RHOPRESSA 0.02 % SOLN, Place 1 drop into the right eye at bedtime., Disp: , Rfl:    tamoxifen (NOLVADEX) 20 MG tablet, TAKE 1 TABLET EVERY DAY, Disp: 90 tablet, Rfl: 4   venlafaxine XR (EFFEXOR-XR) 37.5 MG 24 hr capsule, TAKE 1 CAPSULE EVERY DAY WITH BREAKFAST, Disp: 90 capsule, Rfl: 0  No orders of the defined types were placed in this encounter.   There are no Patient Instructions on file for this visit.  --Continue cardiac medications as reconciled in final medication list. --No follow-ups on file. Or sooner if needed. --Continue follow-up with your primary care physician regarding the management of your other chronic comorbid conditions.  Patient's questions and concerns were  addressed to her satisfaction. She voices understanding of the instructions provided during this encounter.   This note was created using a voice recognition software as a result there may be grammatical errors inadvertently enclosed that do not reflect the nature of this encounter. Every attempt is made to correct such errors.  Rex Kras, [place], [facility]  Pager: [phone] Office: [phone]

## 2021-03-12 ENCOUNTER — Other Ambulatory Visit: Payer: Self-pay

## 2021-03-12 DIAGNOSIS — C50411 Malignant neoplasm of upper-outer quadrant of right female breast: Secondary | ICD-10-CM

## 2021-03-12 DIAGNOSIS — Z170 Estrogen receptor positive status [ER+]: Secondary | ICD-10-CM

## 2021-03-12 NOTE — Progress Notes (Addendum)
[place]  [phone] [phone]    ID: Denise Becker OB: May 30, 1950  MR#: [id number]  DUK#:[id number]  Patient Care Team: Leeroy Cha, MD as PCP - General (Internal Medicine) Lahoma Crocker, MD as Consulting Physician (Obstetrics and Gynecology) OTHER MD:    CHIEF COMPLAINT: Right breast cancer (s/p mastectomy)  CURRENT TREATMENT: Tamoxifen   INTERVAL HISTORY: Marquisha returns today for follow-up of her estrogen receptor positive breast cancer.   She continues on tamoxifen.  She is tolerating this with no unusual side effects.  Hot flashes and vaginal wetness are not a major concern.  She is also on low-dose venlafaxine which is helping.  Since her last visit, she underwent left screening mammography with tomography at [place] on 12/20/2020 showing: breast density category B; no evidence of malignancy.   REVIEW OF SYSTEMS: Asjia uses a cane when walking at home and a walker when walking outside.  She is very anxious walking in open spaces as she puts it.  She does have a walker which is a bit cumbersome.  She tells me she has a variety of GI problems and she wonders if she should see a GI doctor.  She also needs a colonoscopy.  She has not had a bone density in some time and she was asking when that would be due.  Otherwise a detailed review of systems today was stable.   COVID 19 VACCINATION STATUS: [place] x2   BREAST CANCER HISTORY: From the initial intake note 03/16/2013:  Hoyle Sauer palpated a change in her right breast sometime in August or September 2014. She was aware that this might be significant, but waited on meds until she saw her gynecologist for a routine visit. Dr. Delsa Sale immediately he set her up for right mammography and ultrasonography performed 03/03/2013. Mammography found an area of density in the upper outer quadrant of the right breast measuring 5 cm in the with right nipple  retraction. There was no evidence of skin thickening. On exam there was a firm palpable mass in the superior right breast extending from 1:00 to 9:00. The right nipple was completely retracted. There was no skin thickening. The right axilla was benign by palpation. Ultrasound showed a prominent irregular hypoechoic mass larger than the ultrasound screen. Ultrasound of the right axilla showed 2 adjacent small lymph nodes with diffusely thickened cortices, the largest measuring 1.1 cm.  On 03/04/2013 the patient underwent biopsy of the right breast mass and low right axillary lymph node, with the pathology ([id number]) showing, in the breast, and invasive ductal carcinoma, grade 3, 100% estrogen receptor positive, and her percent progesterone receptor positive, both with strong staining intensity, and a proliferation marker of 20%. There was no HER-2 amplification with a ratio of 1.46 by CISH and an average copy number of 2.85.  On 03/11/2013 the patient underwent bilateral breast MRI. This showed an irregular enhancing mass in the central to upper right breast with nipple retraction measuring in total 6.8 cm. There were several adjacent enhancing nodules as well. There was no involvement of the pectoralis muscle. There were no definite morphologically abnormal right axillary lymph nodes there was no definite internal mammary or lymphadenopathy. Left breast was unremarkable.  The patient's subsequent history is as detailed below    PAST MEDICAL HISTORY: Past Medical History:  Diagnosis Date   Anxiety    anxiousabout impending surgery- loss of breast   Arthritis    "joints hurt q now and then" (10/25/2013)   Breast  cancer [facility])    Breast cancer of upper-outer quadrant of right female breast ([place]) 03/10/2013   Depression    history of depression after son died in the 2000's   GERD (gastroesophageal reflux disease)    "just chemo related"   H/O hiatal hernia    Hypertension    Radiation  12/08/13-01/17/14   Right chest wall 50.4 Gy   Spontaneous dislocation of shoulder [phone]   "right; a few times over the years"    PAST SURGICAL HISTORY: Past Surgical History:  Procedure Laterality Date   ABDOMINAL HYSTERECTOMY  1990's   BREAST BIOPSY Right 02/2013   CESAREAN SECTION  1980   MASTECTOMY COMPLETE / SIMPLE W/ SENTINEL NODE BIOPSY Right 10/25/2013   MASTECTOMY W/ SENTINEL NODE BIOPSY Right 10/25/2013   Procedure: RIGHT TOTAL MASTECTOMY WITH SENTINEL LYMPH NODE BIOPSY;  Surgeon: Adin Hector, MD;  Location: [facility];  Service: General;  Laterality: Right;   MULTIPLE TOOTH EXTRACTIONS Bilateral 2011-2014 X 6   PORT A CATH REVISION Right 04/29/2013   Procedure: PORT A CATH REVISION;  Surgeon: Adin Hector, MD;  Location: [facility];  Service: General;  Laterality: Right;   PORT-A-CATH REMOVAL  10/25/2013   PORT-A-CATH REMOVAL N/A 10/25/2013   Procedure: REMOVAL PORT-A-CATH;  Surgeon: Adin Hector, MD;  Location: [place];  Service: General;  Laterality: N/A;   PORTACATH PLACEMENT Right 04/15/2013   Procedure: INSERTION PORT-A-CATH;  Surgeon: Adin Hector, MD;  Location: [facility];  Service: General;  Laterality: Right;   TONSILLECTOMY     WISDOM TOOTH EXTRACTION      FAMILY HISTORY Family History  Problem Relation Age of Onset   Hypertension Mother    Bowel Disease Mother    Lung disease Father    Cancer Father    Heart disease Maternal Grandmother    Heart disease Maternal Grandfather    Heart disease Maternal Uncle    Heart attack Maternal Uncle   the patient's mother,Flowrene Millings, had a lung mass but apparently died from unrelated causes (she was followed by Dr. Earlie Server here). She was 71 years old. The patient has little information about her father. The patient had 2 brothers, no sisters. There is no history of breast or ovarian cancer in the family to her knowledge.    GYNECOLOGIC HISTORY:   (Reviewed  08/23/2013) Menarche age 59, first live birth age 51, the patient is GX P1. She went through the change of life approximately 1999. She did not take hormone replacement.   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 She was on disability throughout her treatment, later retired. She is single. The patient's son Helen Hashimoto died at the age of 87 with acute leukemia. The patient has no grandchildren.    ADVANCED DIRECTIVES: Not in place, but the patient was given the appropriate documents to complete and notarize on her 03/16/2013 visit. She intends to name her cousin, Sharrie Rothman, as her healthcare power of attorney. She can be reached at [place]:   Social History   Tobacco Use   Smoking status: Former    Packs/day: 0.25    Years: 3.00    Pack years: 0.75    Types: Cigarettes    Quit date: 04/12/2001    Years since quitting: 19.9   Smokeless tobacco: Never   Tobacco comments:    "casual smoker; pack would go stale on me"  Vaping Use   Vaping Use: Never used  Substance Use Topics   Alcohol use: Yes    Comment: 10/25/2013 "might have a glass of wine a few times/year"   Drug use: No     Colonoscopy: Not on file  PAP: UTD/Dr. Delsa Sale  Bone density:  Never  Lipid panel:  Dec 2014/Dr. Jackson-Moore   Allergies  Allergen Reactions   Ciprofloxacin Diarrhea    Patient developed severe diarrhea on cipro   Penicillins Rash    Current Outpatient Medications  Medication Sig Dispense Refill   amLODipine (NORVASC) 10 MG tablet Take 10 mg by mouth daily.     COMBIGAN 0.2-0.5 % ophthalmic solution      dorzolamide (TRUSOPT) 2 % ophthalmic solution Place 1 drop into the right eye 2 (two) times daily.     hydrALAZINE (APRESOLINE) 25 MG tablet TAKE 1 TABLET THREE TIMES DAILY 270 tablet 1   isosorbide dinitrate (ISORDIL) 20 MG tablet Take 1 tablet (20 mg total) by mouth 3 (three)  times daily. 270 tablet 1   losartan (COZAAR) 100 MG tablet Take 1 tablet (100 mg total) by mouth daily. 7 tablet 0   RHOPRESSA 0.02 % SOLN Place 1 drop into the right eye at bedtime.     tamoxifen (NOLVADEX) 20 MG tablet Take 1 tablet (20 mg total) by mouth daily. 90 tablet 4   venlafaxine XR (EFFEXOR-XR) 37.5 MG 24 hr capsule TAKE 1 CAPSULE EVERY DAY WITH BREAKFAST 90 capsule 4   No current facility-administered medications for this visit.   Objective:  African American woman using a walker  Vitals:   03/13/21 1030  BP: (!) 155/81  Pulse: 68  Resp: 18  Temp: 97.8 F (36.6 C)  SpO2: 98%    Filed Weights   03/13/21 1030  Weight: 193 lb 9.6 oz (87.8 kg)    Body mass index is 36.58 kg/m.  ECOG: 1  Sclerae unicteric, EOMs intact Wearing a mask No cervical or supraclavicular adenopathy Lungs no rales or rhonchi Heart regular rate and rhythm Abd soft, nontender, positive bowel sounds MSK no focal spinal tenderness, no upper extremity lymphedema Neuro: nonfocal, well oriented, appropriate affect Breasts: The right breast is status postmastectomy and radiation.  There is no evidence of local recurrence.  The left breast and both axillae are benign.   LAB RESULTS:   Lab Results  Component Value Date   WBC 7.6 03/13/2021   NEUTROABS 3.5 03/13/2021   HGB 14.5 03/13/2021   HCT 43.4 03/13/2021   MCV 82.5 03/13/2021   PLT 264 03/13/2021      Chemistry      Component Value Date/Time   NA 140 03/13/2021 1004   NA 139 10/10/2019 1507   NA 141 07/29/2016 1120   K 3.3 (L) 03/13/2021 1004   K 3.5 07/29/2016 1120   CL 108 03/13/2021 1004   CO2 25 03/13/2021 1004   CO2 27 07/29/2016 1120   BUN 12 03/13/2021 1004   BUN 12 10/10/2019 1507   BUN 13.1 07/29/2016 1120   CREATININE 0.91 03/13/2021 1004   CREATININE 1.0 07/29/2016 1120      Component Value Date/Time   CALCIUM 9.4 03/13/2021 1004   CALCIUM 9.8 07/29/2016 1120   ALKPHOS 71 03/13/2021 1004   ALKPHOS 65  07/29/2016 1120   AST 14 (L) 03/13/2021 1004   AST 13 07/29/2016 1120   ALT 13 03/13/2021 1004   ALT 17 07/29/2016 1120   BILITOT 0.3 03/13/2021 1004   BILITOT  0.37 07/29/2016 1120      STUDIES: No results found.   ASSESSMENT: 70 y.o. [place] woman   (1)  status post right breast biopsy 03/04/2013 for a clinical T3 N1, stage IIIA invasive ductal carcinoma, grade 3, estrogen receptor 100% positive, progesterone receptor 100% positive, with an MIB-1 of 20% and no HER-2 amplification.  (2) right axillary lymph node biopsy 03/04/2013 was negative  (3) neoadjuvant chemotherapy with docetaxel, doxorubicin and cyclophosphamide x6 cycles completed 08/23/2013  (4) status post right total mastectomy and right axillary sentinel node biopsy 10/25/2013 for a residual ypT3 ypN0 invasive lobular carcinoma, again estrogen and progesterone receptor positive and HER-2 nonamplified; with negative margins  (5) adjuvant radiation 12/08/2013-01/17/2014:     Right chest wall/50.4 Pearline Cables @ 1.8 Gray per fraction x 28 fractions  (6) started tamoxifen 02/28/2014--decided against switching to anastrozole  (7) large ventral hernia   PLAN: Jisela is now a little over 7 years out from definitive surgery for her breast cancer with no evidence of disease recurrence.  This is very favorable.  She is tolerating tamoxifen well and the plan is to continue that for 10 years.  I think she would do better with a Rollator then with a rolling wheelchair and I wrote her a prescription for that.  I also wrote her a prescription for bras and prostheses given her right mastectomy.  She has a high globulin albumin ratio, higher than the last time.  I am going to add a little bit of additional lab work today to make sure we are not dealing with monoclonal gammopathy.  She is having some GI issues and needs a colonoscopy so I have placed a referral to [facility].  She will have mammography and a bone density  September 2023, and she will see [name] shortly after that for routine follow-up.  Total encounter time 35 minutes.*   Kiyan Burmester, Virgie Dad, MD  03/13/21 11:07 AM Medical Oncology and Hematology [facility] [place], [place] [address] Tel. [phone]    Fax. [phone]   I, Wilburn Mylar, am acting as scribe for Dr. Virgie Dad. Eloy Fehl.  I, Lurline Del MD, have reviewed the above documentation for accuracy and completeness, and I agree with the above.   *Total Encounter Time as defined by the Centers for Medicare and Medicaid Services includes, in addition to the face-to-face time of a patient visit (documented in the note above) non-face-to-face time: obtaining and reviewing outside history, ordering and reviewing medications, tests or procedures, care coordination (communications with other health care professionals or caregivers) and documentation in the medical record.

## 2021-03-13 ENCOUNTER — Inpatient Hospital Stay: Payer: Medicare HMO | Admitting: Oncology

## 2021-03-13 ENCOUNTER — Other Ambulatory Visit: Payer: Self-pay

## 2021-03-13 ENCOUNTER — Inpatient Hospital Stay: Payer: Medicare HMO | Attending: Oncology

## 2021-03-13 ENCOUNTER — Inpatient Hospital Stay: Payer: Medicare HMO

## 2021-03-13 VITALS — BP 155/81 | HR 68 | Temp 97.8°F | Resp 18 | Ht 61.0 in | Wt 193.6 lb

## 2021-03-13 DIAGNOSIS — Z9221 Personal history of antineoplastic chemotherapy: Secondary | ICD-10-CM | POA: Insufficient documentation

## 2021-03-13 DIAGNOSIS — I10 Essential (primary) hypertension: Secondary | ICD-10-CM | POA: Diagnosis not present

## 2021-03-13 DIAGNOSIS — Z853 Personal history of malignant neoplasm of breast: Secondary | ICD-10-CM | POA: Insufficient documentation

## 2021-03-13 DIAGNOSIS — Z9011 Acquired absence of right breast and nipple: Secondary | ICD-10-CM | POA: Diagnosis not present

## 2021-03-13 DIAGNOSIS — Z170 Estrogen receptor positive status [ER+]: Secondary | ICD-10-CM

## 2021-03-13 DIAGNOSIS — C50411 Malignant neoplasm of upper-outer quadrant of right female breast: Secondary | ICD-10-CM | POA: Diagnosis not present

## 2021-03-13 DIAGNOSIS — R771 Abnormality of globulin: Secondary | ICD-10-CM | POA: Insufficient documentation

## 2021-03-13 DIAGNOSIS — Z923 Personal history of irradiation: Secondary | ICD-10-CM | POA: Diagnosis not present

## 2021-03-13 DIAGNOSIS — Z87891 Personal history of nicotine dependence: Secondary | ICD-10-CM | POA: Insufficient documentation

## 2021-03-13 DIAGNOSIS — Z79899 Other long term (current) drug therapy: Secondary | ICD-10-CM | POA: Insufficient documentation

## 2021-03-13 LAB — CBC WITH DIFFERENTIAL (CANCER CENTER ONLY)
Abs Immature Granulocytes: 0.02 10*3/uL (ref 0.00–0.07)
Basophils Absolute: 0.1 10*3/uL (ref 0.0–0.1)
Basophils Relative: 1 %
Eosinophils Absolute: 0.2 10*3/uL (ref 0.0–0.5)
Eosinophils Relative: 3 %
HCT: 43.4 % (ref 36.0–46.0)
Hemoglobin: 14.5 g/dL (ref 12.0–15.0)
Immature Granulocytes: 0 %
Lymphocytes Relative: 41 %
Lymphs Abs: 3.1 10*3/uL (ref 0.7–4.0)
MCH: 27.6 pg (ref 26.0–34.0)
MCHC: 33.4 g/dL (ref 30.0–36.0)
MCV: 82.5 fL (ref 80.0–100.0)
Monocytes Absolute: 0.7 10*3/uL (ref 0.1–1.0)
Monocytes Relative: 10 %
Neutro Abs: 3.5 10*3/uL (ref 1.7–7.7)
Neutrophils Relative %: 45 %
Platelet Count: 264 10*3/uL (ref 150–400)
RBC: 5.26 MIL/uL — ABNORMAL HIGH (ref 3.87–5.11)
RDW: 15 % (ref 11.5–15.5)
WBC Count: 7.6 10*3/uL (ref 4.0–10.5)
nRBC: 0 % (ref 0.0–0.2)

## 2021-03-13 LAB — CMP (CANCER CENTER ONLY)
ALT: 13 U/L (ref 0–44)
AST: 14 U/L — ABNORMAL LOW (ref 15–41)
Albumin: 3.7 g/dL (ref 3.5–5.0)
Alkaline Phosphatase: 71 U/L (ref 38–126)
Anion gap: 7 (ref 5–15)
BUN: 12 mg/dL (ref 8–23)
CO2: 25 mmol/L (ref 22–32)
Calcium: 9.4 mg/dL (ref 8.9–10.3)
Chloride: 108 mmol/L (ref 98–111)
Creatinine: 0.91 mg/dL (ref 0.44–1.00)
GFR, Estimated: >60 mL/min (ref >60)
Glucose, Bld: 91 mg/dL (ref 70–99)
Potassium: 3.3 mmol/L — ABNORMAL LOW (ref 3.5–5.1)
Sodium: 140 mmol/L (ref 135–145)
Total Bilirubin: 0.3 mg/dL (ref 0.3–1.2)
Total Protein: 9.1 g/dL — ABNORMAL HIGH (ref 6.5–8.1)

## 2021-03-13 MED ORDER — TAMOXIFEN CITRATE 20 MG PO TABS: 20.0000 mg | ORAL_TABLET | Freq: Every day | ORAL | 4 refills | Status: DC

## 2021-03-13 MED ORDER — VENLAFAXINE HCL ER 37.5 MG PO CP24: ORAL_CAPSULE | ORAL | 4 refills | Status: DC

## 2021-03-14 ENCOUNTER — Telehealth: Payer: Self-pay | Admitting: Oncology

## 2021-03-14 LAB — KAPPA/LAMBDA LIGHT CHAINS
Kappa free light chain: 16.6 mg/L (ref 3.3–19.4)
Kappa, lambda light chain ratio: 0.03 — ABNORMAL LOW (ref 0.26–1.65)
Lambda free light chains: 641.1 mg/L — ABNORMAL HIGH (ref 5.7–26.3)

## 2021-03-14 NOTE — Telephone Encounter (Signed)
Scheduled appointment per 12/14 los. Left message.

## 2021-03-20 LAB — MULTIPLE MYELOMA PANEL, SERUM
Albumin SerPl Elph-Mcnc: 3.6 g/dL (ref 2.9–4.4)
Albumin/Glob SerPl: 0.8 (ref 0.7–1.7)
Alpha 1: 0.2 g/dL (ref 0.0–0.4)
Alpha2 Glob SerPl Elph-Mcnc: 0.8 g/dL (ref 0.4–1.0)
B-Globulin SerPl Elph-Mcnc: 1.1 g/dL (ref 0.7–1.3)
Gamma Glob SerPl Elph-Mcnc: 2.4 g/dL — ABNORMAL HIGH (ref 0.4–1.8)
Globulin, Total: 4.6 g/dL — ABNORMAL HIGH (ref 2.2–3.9)
IgA: 192 mg/dL (ref 64–422)
IgG (Immunoglobin G), Serum: 3443 mg/dL — ABNORMAL HIGH (ref 586–1602)
IgM (Immunoglobulin M), Srm: 69 mg/dL (ref 26–217)
M Protein SerPl Elph-Mcnc: 2.1 g/dL — ABNORMAL HIGH
Total Protein ELP: 8.2 g/dL (ref 6.0–8.5)

## 2021-03-21 ENCOUNTER — Other Ambulatory Visit: Payer: Self-pay | Admitting: Oncology

## 2021-03-21 DIAGNOSIS — D472 Monoclonal gammopathy: Secondary | ICD-10-CM | POA: Insufficient documentation

## 2021-03-21 NOTE — Progress Notes (Signed)
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  I reassured her that this is not a cancer but it could be a precancer and certainly needs monitoring.  I explained to her that the doctor who does most of that in our group is Dr. Lorenso Courier and that this is nothing to do with breast cancer.  She is agreeable to meet with Dr. Lorenso Courier late January and I am going to obtain a new set of labs prior to that visit.  He may wish to proceed to bone survey or additional evaluation at his discretion after that initial visit.

## 2021-03-22 ENCOUNTER — Telehealth: Payer: Self-pay | Admitting: Oncology

## 2021-03-22 NOTE — Telephone Encounter (Signed)
Scheduled per sch msg. Called and left msg  

## 2021-04-22 ENCOUNTER — Other Ambulatory Visit: Payer: Self-pay | Admitting: Hematology and Oncology

## 2021-04-22 ENCOUNTER — Inpatient Hospital Stay: Payer: Medicare HMO | Attending: Oncology

## 2021-04-22 ENCOUNTER — Other Ambulatory Visit: Payer: Self-pay

## 2021-04-22 DIAGNOSIS — R771 Abnormality of globulin: Secondary | ICD-10-CM | POA: Diagnosis present

## 2021-04-22 DIAGNOSIS — Z853 Personal history of malignant neoplasm of breast: Secondary | ICD-10-CM | POA: Insufficient documentation

## 2021-04-22 DIAGNOSIS — D472 Monoclonal gammopathy: Secondary | ICD-10-CM

## 2021-04-22 LAB — COMPREHENSIVE METABOLIC PANEL
ALT: 14 U/L (ref 0–44)
AST: 13 U/L — ABNORMAL LOW (ref 15–41)
Albumin: 3.8 g/dL (ref 3.5–5.0)
Alkaline Phosphatase: 70 U/L (ref 38–126)
Anion gap: 6 (ref 5–15)
BUN: 14 mg/dL (ref 8–23)
CO2: 26 mmol/L (ref 22–32)
Calcium: 9.6 mg/dL (ref 8.9–10.3)
Chloride: 108 mmol/L (ref 98–111)
Creatinine, Ser: 0.87 mg/dL (ref 0.44–1.00)
GFR, Estimated: >60 mL/min (ref >60)
Glucose, Bld: 87 mg/dL (ref 70–99)
Potassium: 3.5 mmol/L (ref 3.5–5.1)
Sodium: 140 mmol/L (ref 135–145)
Total Bilirubin: 0.3 mg/dL (ref 0.3–1.2)
Total Protein: 8.8 g/dL — ABNORMAL HIGH (ref 6.5–8.1)

## 2021-04-22 LAB — CBC WITH DIFFERENTIAL/PLATELET
Abs Immature Granulocytes: 0.01 10*3/uL (ref 0.00–0.07)
Basophils Absolute: 0.1 10*3/uL (ref 0.0–0.1)
Basophils Relative: 1 %
Eosinophils Absolute: 0.3 10*3/uL (ref 0.0–0.5)
Eosinophils Relative: 3 %
HCT: 42 % (ref 36.0–46.0)
Hemoglobin: 13.7 g/dL (ref 12.0–15.0)
Immature Granulocytes: 0 %
Lymphocytes Relative: 42 %
Lymphs Abs: 3.8 10*3/uL (ref 0.7–4.0)
MCH: 27 pg (ref 26.0–34.0)
MCHC: 32.6 g/dL (ref 30.0–36.0)
MCV: 82.7 fL (ref 80.0–100.0)
Monocytes Absolute: 0.8 10*3/uL (ref 0.1–1.0)
Monocytes Relative: 9 %
Neutro Abs: 4 10*3/uL (ref 1.7–7.7)
Neutrophils Relative %: 45 %
Platelets: 318 10*3/uL (ref 150–400)
RBC: 5.08 MIL/uL (ref 3.87–5.11)
RDW: 15.4 % (ref 11.5–15.5)
WBC: 9 10*3/uL (ref 4.0–10.5)
nRBC: 0 % (ref 0.0–0.2)

## 2021-04-22 LAB — LACTATE DEHYDROGENASE: LDH: 137 U/L (ref 98–192)

## 2021-04-23 LAB — KAPPA/LAMBDA LIGHT CHAINS
Kappa free light chain: 21.5 mg/L — ABNORMAL HIGH (ref 3.3–19.4)
Kappa, lambda light chain ratio: 0.03 — ABNORMAL LOW (ref 0.26–1.65)
Lambda free light chains: 675.8 mg/L — ABNORMAL HIGH (ref 5.7–26.3)

## 2021-04-23 LAB — BETA 2 MICROGLOBULIN, SERUM: Beta-2 Microglobulin: 2.1 mg/L (ref 0.6–2.4)

## 2021-04-29 ENCOUNTER — Inpatient Hospital Stay: Payer: Medicare HMO | Admitting: Hematology and Oncology

## 2021-05-14 ENCOUNTER — Telehealth: Payer: Self-pay | Admitting: Hematology and Oncology

## 2021-05-14 NOTE — Telephone Encounter (Signed)
R/s pt's appt with Dr. Lorenso Courier per pt request. Pt is aware of new appt date and time.

## 2021-05-15 ENCOUNTER — Telehealth: Payer: Self-pay | Admitting: Hematology and Oncology

## 2021-05-15 NOTE — Telephone Encounter (Signed)
Sch per 2/14 inbasket, patient aware

## 2021-05-20 NOTE — Progress Notes (Signed)
[place] [phone]   [phone]  INITIAL CONSULT NOTE  Patient Care Team: Leeroy Cha, MD as PCP - General (Internal Medicine) Lahoma Crocker, MD as Consulting Physician (Obstetrics and Gynecology)  Hematological/Oncological History #Right breast cancer -03/03/2013: Right breast biopsy: T3 N1, stage IIIA invasive ductal carcinoma, grade 3, estrogen receptor 100% positive, progesterone receptor 100% positive, with an MIB-1 of 20% and no HER-2 amplification -03/04/2014: Right axillary lymph node biopsy was negatibe -08/23/2013: Received 6 cycles of neoadjuvant chemotherapy with docetaxel, doxorubicin and cyclophosphamide. -10/25/2013: Underwent right total mastectomy and right axillary sentinel node biopsy. Pathology revealed ypT3 ypN0 invasive lobular carcinoma, again estrogen and progesterone receptor positive and HER-2 nonamplified; with negative margins -12/08/2013-01/17/2014: Adjuvant radiation to right chest wall. 50.4 Gy in 28 fractions.  -03/01/2014: Started tamoxifen with plans to continue for 10 years.   #Monoclonal gammopathy -03/13/2021: SPEP showed M protein measuring 2.1 g/dL, serum IgG measuring 3,442 mg/dL. Immunofixation showed IgG monocloncal protein with lamda light chain specificity. Lambda free light chains elevated at 641.1 with kappa/lamda ratio at 0.03.  -04/22/2021:Kappa free light chains elevated at 21.5 mg/L, Lambda free light chains elevated at 675.6 mg/L with kappa/lamda ratio at 0.03.  -05/21/2021: Establish care with Dede Query PA-C  CHIEF COMPLAINTS/PURPOSE OF CONSULTATION:  Monoclonal gammopathy  HISTORY OF PRESENTING ILLNESS:  Denise Becker 71 y.o. female to the clinic to evaluate for monoclonal gammopathy.  Patient is unaccompanied for this visit.  Patient was last seen by Dr. Jana Hakim on 03/13/2021 for her history of right-sided breast cancer.  In the interim, she has undergone additional  laboratory evaluation for evidence of monoclonal gammopathy.  On exam today, this [place] reports that her energy levels are fairly stable.  She has occasional episodes of fatigue but she continues to complete her daily activities on her own.  Has chronic lower extremity heaviness and neuropathy that has been present for the last 2 years.  She says this does affect her balance and she uses a walker to help with ambulation.  Patient takes over-the-counter Aleve with the muscle relaxant that helps with her symptoms.  She reports her appetite is good and she denies any recent weight changes.  She denies any nausea, vomiting or abdominal pain.  Patient reports occasional episodes of constipation that resolves on its own.  Denies easy bruising or signs of active bleeding patient denies any back, hip or other bone pain.She reports hyperpigmentation of her hands with occasional pruritis. She denies trying any new lotions, perfumes or medications. She denies fevers, chills, night sweats, shortness of breath, chest pain or cough. She has no other complaints. Rest of the 10 point ROS is below.   MEDICAL HISTORY:  Past Medical History:  Diagnosis Date   Anxiety    anxiousabout impending surgery- loss of breast   Arthritis    "joints hurt q now and then" (10/25/2013)   Breast cancer ([place])    Breast cancer of upper-outer quadrant of right female breast ([place]) 03/10/2013   Depression    history of depression after son died in the 2000's   GERD (gastroesophageal reflux disease)    "just chemo related"   H/O hiatal hernia    Hypertension    Radiation 12/08/13-01/17/14   Right chest wall 50.4 Gy   Spontaneous dislocation of shoulder [phone]   "right; a few times over the years"    SURGICAL HISTORY: Past Surgical History:  Procedure Laterality Date   ABDOMINAL HYSTERECTOMY  1990's   BREAST BIOPSY  Right 02/2013   CESAREAN SECTION  1980   MASTECTOMY COMPLETE / SIMPLE W/ SENTINEL NODE BIOPSY Right  10/25/2013   MASTECTOMY W/ SENTINEL NODE BIOPSY Right 10/25/2013   Procedure: RIGHT TOTAL MASTECTOMY WITH SENTINEL LYMPH NODE BIOPSY;  Surgeon: Adin Hector, MD;  Location: [facility] [place];  Service: General;  Laterality: Right;   MULTIPLE TOOTH EXTRACTIONS Bilateral 2011-2014 X 6   PORT A CATH REVISION Right 04/29/2013   Procedure: PORT A CATH REVISION;  Surgeon: Adin Hector, MD;  Location: [place];  Service: General;  Laterality: Right;   PORT-A-CATH REMOVAL  10/25/2013   PORT-A-CATH REMOVAL N/A 10/25/2013   Procedure: REMOVAL PORT-A-CATH;  Surgeon: Adin Hector, MD;  Location: [place];  Service: General;  Laterality: N/A;   PORTACATH PLACEMENT Right 04/15/2013   Procedure: INSERTION PORT-A-CATH;  Surgeon: Adin Hector, MD;  Location: [place];  Service: General;  Laterality: Right;   TONSILLECTOMY     WISDOM TOOTH EXTRACTION      SOCIAL HISTORY: Social History   Socioeconomic History   Marital status: Single    Spouse name: Not on file   Number of children: 1   Years of education: Not on file   Highest education level: Not on file  Occupational History   Not on file  Tobacco Use   Smoking status: Former    Packs/day: 0.25    Years: 3.00    Pack years: 0.75    Types: Cigarettes    Quit date: 04/12/2001    Years since quitting: 20.1   Smokeless tobacco: Never   Tobacco comments:    "casual smoker; pack would go stale on me"  Vaping Use   Vaping Use: Never used  Substance and Sexual Activity   Alcohol use: Yes    Comment: 10/25/2013 "might have a glass of wine a few times/year"   Drug use: No   Sexual activity: Never    Birth control/protection: Surgical  Other Topics Concern   Not on file  Social History Narrative   Not on file   Social Determinants of Health   Financial Resource Strain: Not on file  Food Insecurity: Not on file  Transportation Needs: Not on file  Physical Activity: Not on file  Stress: Not on file  Social  Connections: Not on file  Intimate Partner Violence: Not on file    FAMILY HISTORY: Family History  Problem Relation Age of Onset   Hypertension Mother    Bowel Disease Mother    Lung disease Father    Cancer Father    Heart disease Maternal Grandmother    Heart disease Maternal Grandfather    Heart disease Maternal Uncle    Heart attack Maternal Uncle     ALLERGIES:  is allergic to effexor [venlafaxine], ciprofloxacin, and penicillins.  MEDICATIONS:  Current Outpatient Medications  Medication Sig Dispense Refill   amLODipine (NORVASC) 10 MG tablet Take 10 mg by mouth daily.     COMBIGAN 0.2-0.5 % ophthalmic solution      dorzolamide (TRUSOPT) 2 % ophthalmic solution Place 1 drop into the right eye 2 (two) times daily.     hydrALAZINE (APRESOLINE) 25 MG tablet TAKE 1 TABLET THREE TIMES DAILY 270 tablet 1   losartan (COZAAR) 100 MG tablet Take 1 tablet (100 mg total) by mouth daily. 7 tablet 0   RHOPRESSA 0.02 % SOLN Place 1 drop into the right eye at bedtime.     tamoxifen (NOLVADEX) 20 MG tablet Take  1 tablet (20 mg total) by mouth daily. 90 tablet 4   isosorbide dinitrate (ISORDIL) 20 MG tablet Take 1 tablet (20 mg total) by mouth 3 (three) times daily. 270 tablet 1   venlafaxine XR (EFFEXOR-XR) 37.5 MG 24 hr capsule TAKE 1 CAPSULE EVERY DAY WITH BREAKFAST (Patient not taking: Reported on 05/21/2021) 90 capsule 4   No current facility-administered medications for this visit.    REVIEW OF SYSTEMS:   Constitutional: ( - ) fevers, ( - )  chills , ( - ) night sweats Eyes: ( - ) blurriness of vision, ( - ) double vision, ( - ) watery eyes Ears, nose, mouth, throat, and face: ( - ) mucositis, ( - ) sore throat Respiratory: ( - ) cough, ( - ) dyspnea, ( - ) wheezes Cardiovascular: ( - ) palpitation, ( - ) chest discomfort, ( - ) lower extremity swelling Gastrointestinal:  ( - ) nausea, ( - ) heartburn, ( - ) change in bowel habits Skin: ( - ) abnormal skin rashes Lymphatics: ( -  ) new lymphadenopathy, ( - ) easy bruising Neurological: ( - ) numbness, ( - ) tingling, ( - ) new weaknesses Behavioral/Psych: ( - ) mood change, ( - ) new changes  All other systems were reviewed with the patient and are negative.  PHYSICAL EXAMINATION: ECOG PERFORMANCE STATUS: 1 - Symptomatic but completely ambulatory  Vitals:   05/21/21 1302  BP: 121/73  Pulse: 73  Resp: 18  Temp: (!) 97.5 F (36.4 C)  SpO2: 98%   Filed Weights   05/21/21 1302  Weight: 199 lb 14.4 oz (90.7 kg)    GENERAL: well appearing female in NAD  SKIN: skin color, texture, turgor are normal, no rashes or significant lesions EYES: conjunctiva are pink and non-injected, sclera clear OROPHARYNX: no exudate, no erythema; lips, buccal mucosa, and tongue normal  NECK: supple, non-tender LYMPH:  no palpable lymphadenopathy in the cervical or supraclavicular lymph nodes.  LUNGS: clear to auscultation and percussion with normal breathing effort HEART: regular rate & rhythm and no murmurs and no lower extremity edema ABDOMEN: soft, non-tender, non-distended, normal bowel sounds Musculoskeletal: no cyanosis of digits and no clubbing  PSYCH: alert & oriented x 3, fluent speech NEURO: no focal motor/sensory deficits  LABORATORY DATA:  I have reviewed the data as listed CBC Latest Ref Rng & Units 05/21/2021 04/22/2021 03/13/2021  WBC 4.0 - 10.5 K/uL 7.6 9.0 7.6  Hemoglobin 12.0 - 15.0 g/dL 14.7 13.7 14.5  Hematocrit 36.0 - 46.0 % 44.9 42.0 43.4  Platelets 150 - 400 K/uL 327 318 264    CMP Latest Ref Rng & Units 04/22/2021 03/13/2021 11/09/2019  Glucose 70 - 99 mg/dL 87 91 118(H)  BUN 8 - 23 mg/dL _0 Creatinine 0.44 - 1.00 mg/dL 0.87 0.91 1.01(H)  Sodium 135 - 145 mmol/L 140 140 140  Potassium 3.5 - 5.1 mmol/L 3.5 3.3(L) 3.4(L)  Chloride 98 - 111 mmol/L 108 108 105  CO2 22 - 32 mmol/L _1 Calcium 8.9 - 10.3 mg/dL 9.6 9.4 9.9  Total Protein 6.5 - 8.1 g/dL 8.8(H) 9.1(H) 8.6(H)  Total Bilirubin  0.3 - 1.2 mg/dL 0.3 0.3 0.4  Alkaline Phos 38 - 126 U/L 70 71 57  AST 15 - 41 U/L 13(L) 14(L) 13(L)  ALT 0 - 44 U/L _2 RADIOGRAPHIC STUDIES: I have personally reviewed the radiological images as listed and agreed with the findings in the report.  No results found.  ASSESSMENT & PLAN Denise Becker is a 71 y.o. who presents to the clinic for further evaluation for monoclonal gammopathy.  Based on SPEP from 03/13/2021, there is evidence of M protein measuring 2.1 g/dL. Serum IgG levels was 3,443 mg/dL. Immunofixation showed IgG  monoclonal protein with lambda light chain specificity.Serum free lambda light chains was elevated at 641.1 mg/L and the kappa/lambda ratio was 0.03.   The recommendation is to repeat serologic workup today and check CBC, CMP, multiple myeloma panel, kappa/lambda light chain. Need to check UPEP with 24 hour urine specimen. Bone Survey will be obtained to evaluate for lytic lesions. Additionally, I recommend to proceed with a bone marrow biopsy due to M protein > 1.5 g/dL and abnormal free light chain ratio. Patient will plan to follow up with Dr. Lorenso Courier after bone marrow biopsy to review results and discuss recommendations.     #Monoclonal gammopathy: --Labs today to check CBC, CMP, multiple myeloma panel, kappa/lambda light chain, UPEP.  --Need to obtain DG bone met surgery to evaluate for lytic lesions.  --Will request bone marrow biopsy --RTC with Dr. Lorenso Courier after bone marrow biopsy to review results and discuss recommendations.   # H/O Right breast cancer --Currently on tamoxifen that she started on 03/01/2014 with plans to continue for 10 years.  --Most recent screening mammogram was on 12/20/2020 with no evidence of malignancy. Next mammogram scheduled for 12/23/2021.   Orders Placed This Encounter  Procedures   DG Bone Survey Met    Standing Status:   Future    Standing Expiration Date:   05/20/2022    Order Specific Question:   Reason for  Exam (SYMPTOM  OR DIAGNOSIS REQUIRED)    Answer:   evaluate for lytic lesions. M protein detected    Order Specific Question:   Preferred imaging location?    Answer:   [facility]   CBC with Differential (Cancer Center Only)    Standing Status:   Future    Number of Occurrences:   1    Standing Expiration Date:   05/20/2022   CMP ([facility] only)    Standing Status:   Future    Number of Occurrences:   1    Standing Expiration Date:   05/20/2022   Multiple Myeloma Panel (SPEP&IFE w/QIG)    Standing Status:   Future    Number of Occurrences:   1    Standing Expiration Date:   05/20/2022   Kappa/lambda light chains    Standing Status:   Future    Number of Occurrences:   1    Standing Expiration Date:   05/20/2022   24-Hr Ur UPEP/UIFE/Light Chains/TP    Standing Status:   Future    Standing Expiration Date:   05/20/2022    All questions were answered. The patient knows to call the clinic with any problems, questions or concerns.  I have spent a total of 35 minutes minutes of face-to-face and non-face-to-face time, preparing to see the patient, obtaining and/or reviewing separately obtained history, performing a medically appropriate examination, counseling and educating the patient, ordering tests/procedures, documenting clinical information in the electronic health record,  and care coordination.   Dede Query, PA-C Department of Hematology/Oncology [place] at [facility] Phone: [phone]

## 2021-05-21 ENCOUNTER — Other Ambulatory Visit: Payer: Self-pay

## 2021-05-21 ENCOUNTER — Inpatient Hospital Stay: Payer: Medicare HMO | Attending: Oncology

## 2021-05-21 ENCOUNTER — Inpatient Hospital Stay: Payer: Medicare HMO | Admitting: Physician Assistant

## 2021-05-21 VITALS — BP 121/73 | HR 73 | Temp 97.5°F | Resp 18 | Wt 199.9 lb

## 2021-05-21 DIAGNOSIS — D472 Monoclonal gammopathy: Secondary | ICD-10-CM

## 2021-05-21 DIAGNOSIS — C50411 Malignant neoplasm of upper-outer quadrant of right female breast: Secondary | ICD-10-CM | POA: Insufficient documentation

## 2021-05-21 DIAGNOSIS — Z170 Estrogen receptor positive status [ER+]: Secondary | ICD-10-CM | POA: Insufficient documentation

## 2021-05-21 DIAGNOSIS — Z79810 Long term (current) use of selective estrogen receptor modulators (SERMs): Secondary | ICD-10-CM | POA: Diagnosis not present

## 2021-05-21 DIAGNOSIS — Z79899 Other long term (current) drug therapy: Secondary | ICD-10-CM | POA: Insufficient documentation

## 2021-05-21 LAB — CMP (CANCER CENTER ONLY)
ALT: 21 U/L (ref 0–44)
AST: 13 U/L — ABNORMAL LOW (ref 15–41)
Albumin: 4 g/dL (ref 3.5–5.0)
Alkaline Phosphatase: 91 U/L (ref 38–126)
Anion gap: 4 — ABNORMAL LOW (ref 5–15)
BUN: 17 mg/dL (ref 8–23)
CO2: 28 mmol/L (ref 22–32)
Calcium: 10.3 mg/dL (ref 8.9–10.3)
Chloride: 106 mmol/L (ref 98–111)
Creatinine: 0.95 mg/dL (ref 0.44–1.00)
GFR, Estimated: >60 mL/min (ref >60)
Glucose, Bld: 88 mg/dL (ref 70–99)
Potassium: 3.5 mmol/L (ref 3.5–5.1)
Sodium: 138 mmol/L (ref 135–145)
Total Bilirubin: 0.5 mg/dL (ref 0.3–1.2)
Total Protein: 8.7 g/dL — ABNORMAL HIGH (ref 6.5–8.1)

## 2021-05-21 LAB — CBC WITH DIFFERENTIAL (CANCER CENTER ONLY)
Abs Immature Granulocytes: 0.01 10*3/uL (ref 0.00–0.07)
Basophils Absolute: 0.1 10*3/uL (ref 0.0–0.1)
Basophils Relative: 1 %
Eosinophils Absolute: 0.4 10*3/uL (ref 0.0–0.5)
Eosinophils Relative: 5 %
HCT: 44.9 % (ref 36.0–46.0)
Hemoglobin: 14.7 g/dL (ref 12.0–15.0)
Immature Granulocytes: 0 %
Lymphocytes Relative: 29 %
Lymphs Abs: 2.2 10*3/uL (ref 0.7–4.0)
MCH: 27.1 pg (ref 26.0–34.0)
MCHC: 32.7 g/dL (ref 30.0–36.0)
MCV: 82.8 fL (ref 80.0–100.0)
Monocytes Absolute: 0.8 10*3/uL (ref 0.1–1.0)
Monocytes Relative: 11 %
Neutro Abs: 4.1 10*3/uL (ref 1.7–7.7)
Neutrophils Relative %: 54 %
Platelet Count: 327 10*3/uL (ref 150–400)
RBC: 5.42 MIL/uL — ABNORMAL HIGH (ref 3.87–5.11)
RDW: 16.9 % — ABNORMAL HIGH (ref 11.5–15.5)
WBC Count: 7.6 10*3/uL (ref 4.0–10.5)
nRBC: 0 % (ref 0.0–0.2)

## 2021-05-22 ENCOUNTER — Ambulatory Visit: Payer: Medicare HMO | Admitting: Podiatry

## 2021-05-22 LAB — KAPPA/LAMBDA LIGHT CHAINS
Kappa free light chain: 20.3 mg/L — ABNORMAL HIGH (ref 3.3–19.4)
Kappa, lambda light chain ratio: 0.03 — ABNORMAL LOW (ref 0.26–1.65)
Lambda free light chains: 611.6 mg/L — ABNORMAL HIGH (ref 5.7–26.3)

## 2021-05-23 LAB — MULTIPLE MYELOMA PANEL, SERUM
Albumin SerPl Elph-Mcnc: 3.8 g/dL (ref 2.9–4.4)
Albumin/Glob SerPl: 0.9 (ref 0.7–1.7)
Alpha 1: 0.3 g/dL (ref 0.0–0.4)
Alpha2 Glob SerPl Elph-Mcnc: 0.9 g/dL (ref 0.4–1.0)
B-Globulin SerPl Elph-Mcnc: 1 g/dL (ref 0.7–1.3)
Gamma Glob SerPl Elph-Mcnc: 2.5 g/dL — ABNORMAL HIGH (ref 0.4–1.8)
Globulin, Total: 4.7 g/dL — ABNORMAL HIGH (ref 2.2–3.9)
IgA: 178 mg/dL (ref 64–422)
IgG (Immunoglobin G), Serum: 3018 mg/dL — ABNORMAL HIGH (ref 586–1602)
IgM (Immunoglobulin M), Srm: 70 mg/dL (ref 26–217)
M Protein SerPl Elph-Mcnc: 2.1 g/dL — ABNORMAL HIGH
Total Protein ELP: 8.5 g/dL (ref 6.0–8.5)

## 2021-05-24 ENCOUNTER — Other Ambulatory Visit: Payer: Self-pay | Admitting: Hematology and Oncology

## 2021-05-24 ENCOUNTER — Other Ambulatory Visit: Payer: Self-pay | Admitting: Oncology

## 2021-05-24 ENCOUNTER — Other Ambulatory Visit: Payer: Self-pay

## 2021-05-24 ENCOUNTER — Telehealth: Payer: Self-pay | Admitting: *Deleted

## 2021-05-24 ENCOUNTER — Ambulatory Visit (HOSPITAL_COMMUNITY)
Admission: RE | Admit: 2021-05-24 | Discharge: 2021-05-24 | Disposition: A | Discharge status: Home / Self Care | Payer: Medicare HMO | Source: Ambulatory Visit | Attending: Physician Assistant | Admitting: Physician Assistant

## 2021-05-24 DIAGNOSIS — D472 Monoclonal gammopathy: Secondary | ICD-10-CM | POA: Insufficient documentation

## 2021-05-24 DIAGNOSIS — Z170 Estrogen receptor positive status [ER+]: Secondary | ICD-10-CM

## 2021-05-24 DIAGNOSIS — C50411 Malignant neoplasm of upper-outer quadrant of right female breast: Secondary | ICD-10-CM

## 2021-05-24 NOTE — Telephone Encounter (Signed)
Denise Becker wants to know if she needs to have a Bone Density when she goes for her mammogram in September. If so, please order.

## 2021-05-27 ENCOUNTER — Other Ambulatory Visit: Payer: Self-pay | Admitting: Hematology and Oncology

## 2021-05-27 ENCOUNTER — Other Ambulatory Visit: Payer: Self-pay | Admitting: *Deleted

## 2021-05-27 ENCOUNTER — Telehealth: Payer: Self-pay | Admitting: *Deleted

## 2021-05-27 DIAGNOSIS — C50411 Malignant neoplasm of upper-outer quadrant of right female breast: Secondary | ICD-10-CM

## 2021-05-27 NOTE — Progress Notes (Signed)
Bone density ordered.

## 2021-05-27 NOTE — Telephone Encounter (Signed)
Received vm message from pt stating that she recieved  a call from the cancer center, but she did not know who called her.

## 2021-05-28 ENCOUNTER — Telehealth: Payer: Self-pay

## 2021-05-28 NOTE — Telephone Encounter (Signed)
can you notify patient that bone scan was negative for bone lesions We will follow up once bone marrow biopsy results are available.  Pt advised and verbalized understanding

## 2021-06-03 ENCOUNTER — Other Ambulatory Visit: Payer: Medicare HMO

## 2021-06-03 ENCOUNTER — Other Ambulatory Visit: Payer: Self-pay

## 2021-06-03 ENCOUNTER — Ambulatory Visit (INDEPENDENT_AMBULATORY_CARE_PROVIDER_SITE_OTHER): Payer: Medicare HMO | Admitting: Podiatry

## 2021-06-03 ENCOUNTER — Encounter: Payer: Self-pay | Admitting: Podiatry

## 2021-06-03 DIAGNOSIS — L84 Corns and callosities: Secondary | ICD-10-CM | POA: Diagnosis not present

## 2021-06-03 DIAGNOSIS — R2681 Unsteadiness on feet: Secondary | ICD-10-CM | POA: Diagnosis not present

## 2021-06-04 NOTE — Progress Notes (Signed)
Subjective:  ? ?Patient ID: [name], female   DOB: 71 y.o.   MRN: [id number]  ? ?HPI ?Patient presents after having had stroke and has significant balance issues bilateral currently using a walker with history of falls.  She is wanted to get balance bracing but has had other medical issues to deal with and now wants this done and also has lesions on both feet that get painful ? ? ?ROS ? ? ?   ?Objective:  ?Physical Exam  ?Significant gait instability bilateral with history of stroke muscle weakness and using a walker with falls that have occurred and would like to be able to try to be more active along with lesion formation subfifth metatarsal bilateral ? ?   ?Assessment:  ?Chronic gait instability bilateral with patient that may benefit from balance bracing to help with chronic falls along with possibly becoming more ambulatory and lesions bilateral ? ?   ?Plan:  ?H&P reviewed conditions debrided lesions no iatrogenic bleeding discussed gait instability and balance brace a no she will have this done by pedorthist.  I am very hopeful that this will be of benefit to her and she is motivated to wear these and to try to reduce the gait instability that she has ?   ? ? ?

## 2021-06-05 ENCOUNTER — Ambulatory Visit: Payer: Medicare HMO | Admitting: Hematology and Oncology

## 2021-06-05 ENCOUNTER — Other Ambulatory Visit: Payer: Medicare HMO

## 2021-06-07 ENCOUNTER — Other Ambulatory Visit: Payer: Self-pay | Admitting: Internal Medicine

## 2021-06-09 NOTE — H&P (Addendum)
Chief Complaint: Patient was seen in consultation today for bone marrow biopsy and aspiration at the request of Denise Becker  Referring Physician(s): Briant Cedar  Supervising Physician: Tommie Raymond  Patient Status: [facility] - Out-pt  History of Present Illness: Denise Becker is a 71 y.o. female with PMH significant for breast cancer, anxiety, depression, GERD, HTN and chemoradiation.  Patient presented to her oncologist December 2022 reporting and low energy levels and fatigue with lower extremity heaviness and neuropathy.  Patient was found to have a monoclonal protein and also a kappa lambda ratio abnormality.  Patient was referred to Denise Kaufmann, PA with hematology who referred patient to IR for bone marrow biopsy and aspiration.  Past Medical History:  Diagnosis Date   Anxiety    anxiousabout impending surgery- loss of breast   Arthritis    "joints hurt q now and then" (10/25/2013)   Breast cancer ([facility])    Breast cancer of upper-outer quadrant of right female breast ([facility]) 03/10/2013   Depression    history of depression after son died in the 2000's   GERD (gastroesophageal reflux disease)    "just chemo related"   H/O hiatal hernia    Hypertension    Radiation 12/08/13-01/17/14   Right chest wall 50.4 Gy   Spontaneous dislocation of shoulder [phone]   "right; a few times over the years"    Past Surgical History:  Procedure Laterality Date   ABDOMINAL HYSTERECTOMY  1990's   BREAST BIOPSY Right 02/2013   CESAREAN SECTION  1980   MASTECTOMY COMPLETE / SIMPLE W/ SENTINEL NODE BIOPSY Right 10/25/2013   MASTECTOMY W/ SENTINEL NODE BIOPSY Right 10/25/2013   Procedure: RIGHT TOTAL MASTECTOMY WITH SENTINEL LYMPH NODE BIOPSY;  Surgeon: Ernestene Mention, MD;  Location: [facility];  Service: General;  Laterality: Right;   MULTIPLE TOOTH EXTRACTIONS Bilateral 2011-2014 X 6   PORT A CATH REVISION Right 04/29/2013   Procedure: PORT A CATH REVISION;  Surgeon: Ernestene Mention, MD;  Location: [facility];  Service: General;  Laterality: Right;   PORT-A-CATH REMOVAL  10/25/2013   PORT-A-CATH REMOVAL N/A 10/25/2013   Procedure: REMOVAL PORT-A-CATH;  Surgeon: Ernestene Mention, MD;  Location: [facility];  Service: General;  Laterality: N/A;   PORTACATH PLACEMENT Right 04/15/2013   Procedure: INSERTION PORT-A-CATH;  Surgeon: Ernestene Mention, MD;  Location: [facility];  Service: General;  Laterality: Right;   TONSILLECTOMY     WISDOM TOOTH EXTRACTION      Allergies: Effexor [venlafaxine], Ciprofloxacin, and Penicillins  Medications: Prior to Admission medications   Medication Sig Start Date End Date Taking? Authorizing Provider  amLODipine (NORVASC) 10 MG tablet Take 10 mg by mouth daily.    [provider]  COMBIGAN 0.2-0.5 % ophthalmic solution  12/02/18   [provider]  dorzolamide (TRUSOPT) 2 % ophthalmic solution Place 1 drop into the right eye 2 (two) times daily. 08/12/19   [provider]  hydrALAZINE (APRESOLINE) 25 MG tablet TAKE 1 TABLET THREE TIMES DAILY 09/19/20   Denise Becker  isosorbide dinitrate (ISORDIL) 20 MG tablet Take 1 tablet (20 mg total) by mouth 3 (three) times daily. 10/20/19 11/19/19  Denise Becker  losartan (COZAAR) 100 MG tablet Take 1 tablet (100 mg total) by mouth daily. 08/18/19   Little, Denise Finland, MD  RHOPRESSA 0.02 % SOLN Place 1 drop into the right eye at bedtime. 08/15/19   [provider]  tamoxifen (NOLVADEX) 20 MG tablet  Take 1 tablet (20 mg total) by mouth daily. 03/13/21   [name], Denise Hue, MD     Family History  Problem Relation Age of Onset   Hypertension Mother    Bowel Disease Mother    Lung disease Father    Cancer Father    Heart disease Maternal Grandmother    Heart disease Maternal Grandfather    Heart disease Maternal Uncle    Heart attack Maternal Uncle     Social History   Socioeconomic History   Marital status: Single    Spouse  name: Not on file   Number of children: 1   Years of education: Not on file   Highest education level: Not on file  Occupational History   Not on file  Tobacco Use   Smoking status: Former    Packs/day: 0.25    Years: 3.00    Pack years: 0.75    Types: Cigarettes    Quit date: 04/12/2001    Years since quitting: 20.2   Smokeless tobacco: Never   Tobacco comments:    "casual smoker; pack would go stale on me"  Vaping Use   Vaping Use: Never used  Substance and Sexual Activity   Alcohol use: Yes    Comment: 10/25/2013 "might have a glass of wine a few times/year"   Drug use: No   Sexual activity: Never    Birth control/protection: Surgical  Other Topics Concern   Not on file  Social History Narrative   Not on file   Social Determinants of Health   Financial Resource Strain: Not on file  Food Insecurity: Not on file  Transportation Needs: Not on file  Physical Activity: Not on file  Stress: Not on file  Social Connections: Not on file    Review of Systems: A 12 point ROS discussed and pertinent positives are indicated in the HPI above.  All other systems are negative.  Review of Systems  All other systems reviewed and are negative.  Vital Signs: There were no vitals taken for this visit.  Physical Exam Constitutional:      Appearance: Normal appearance.  HENT:     Head: Normocephalic and atraumatic.  Eyes:     Extraocular Movements: Extraocular movements intact.     Pupils: Pupils are equal, round, and reactive to light.  Cardiovascular:     Rate and Rhythm: Normal rate and regular rhythm.     Pulses: Normal pulses.     Heart sounds: Normal heart sounds.  Pulmonary:     Effort: Pulmonary effort is normal. No respiratory distress.     Breath sounds: Normal breath sounds.  Abdominal:     General: Abdomen is flat. Bowel sounds are normal.     Palpations: Abdomen is soft.  Musculoskeletal:     Right lower leg: No edema.     Left lower leg: No edema.   Skin:    General: Skin is warm and dry.  Neurological:     Mental Status: She is alert and oriented to person, place, and time.  Psychiatric:        Mood and Affect: Mood normal.        Behavior: Behavior normal.        Thought Content: Thought content normal.        Judgment: Judgment normal.    Imaging: No results found.  Labs:  CBC: Recent Labs    03/13/21 1004 04/22/21 1227 05/21/21 1427  WBC 7.6 9.0 7.6  HGB 14.5 13.7  14.7  HCT 43.4 42.0 44.9  PLT 264 318 327    COAGS: No results for input(s): INR, APTT in the last 8760 hours.  BMP: Recent Labs    03/13/21 1004 04/22/21 1227 05/21/21 1427  NA 140 140 138  K 3.3* 3.5 3.5  CL 108 108 106  CO2 25 26 28   GLUCOSE 91 87 88  BUN 12 14 17   CALCIUM 9.4 9.6 10.3  CREATININE 0.91 0.87 0.95  GFRNONAA >60 >60 >60    LIVER FUNCTION TESTS: Recent Labs    03/13/21 1004 04/22/21 1227 05/21/21 1427  BILITOT 0.3 0.3 0.5  AST 14* 13* 13*  ALT 13 14 21   ALKPHOS 71 70 91  PROT 9.1* 8.8* 8.7*  ALBUMIN 3.7 3.8 4.0    TUMOR MARKERS: No results for input(s): AFPTM, CEA, CA199, CHROMGRNA in the last 8760 hours.  Assessment and Plan: History of breast cancer, anxiety, depression, GERD, HTN and chemoradiation.  Patient presented to her oncologist December 2022 reporting and low energy levels and fatigue with lower extremity heaviness and neuropathy.  Patient was found to have a monoclonal protein and also a kappa lambda ratio abnormality.  Patient was referred to Denise Kaufmann, PA with hematology who referred patient to IR for bone marrow biopsy and aspiration.   Pt A&O, calm and pleasant.  She is NPO per order.   Today's labs pending.   Risks and benefits of bone marrow biopsy and aspiration with moderate sedation was discussed with the patient and/or patient's family including, but not limited to bleeding, infection, damage to adjacent structures or low yield requiring additional tests.  All of the questions were  answered and there is agreement to proceed.  Consent signed and in chart.   Thank you for this interesting consult.  I greatly enjoyed meeting IBTIHAJ GASCO and look forward to participating in their care.  A copy of this report was sent to the requesting provider on this date.  Electronically Signed: Shon Hough, NP 07/02/2021, 8:23 AM   I spent a total of 20 minutes in face to face in clinical consultation, greater than 50% of which was counseling/coordinating care for bone marrow biopsy and aspiration with moderate sedation.

## 2021-06-10 ENCOUNTER — Telehealth: Payer: Self-pay

## 2021-06-10 ENCOUNTER — Ambulatory Visit (HOSPITAL_COMMUNITY): Payer: Medicare HMO

## 2021-06-10 ENCOUNTER — Ambulatory Visit (HOSPITAL_COMMUNITY)
Admission: RE | Admit: 2021-06-10 | Discharge: 2021-06-10 | Disposition: A | Discharge status: Home / Self Care | Payer: Medicare HMO | Source: Ambulatory Visit | Attending: Physician Assistant | Admitting: Physician Assistant

## 2021-06-10 NOTE — Telephone Encounter (Signed)
T/C from pt stating she had to cancel her BMBX for today due to illness.  I gave her the number to scheduling for her to call and reschedule at her convenience. ?

## 2021-06-12 NOTE — Progress Notes (Deleted)
? ? ? ?06/12/2021 ?Denise Becker ?[id number] ?Mar 04, 1951 ? ? ?ASSESSMENT AND PLAN:  ?*** ?There are no diagnoses linked to this encounter. ? ? ?Patient Care Team: ?Leeroy Cha, MD as PCP - General (Internal Medicine) ?Lahoma Crocker, MD as Consulting Physician (Obstetrics and Gynecology) ? ?HISTORY OF PRESENT ILLNESS: ?71 y.o. 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  ? ?External labs and notes reviewed this visit: ?*** ? ?Current Medications:  ? ? ?Current Outpatient Medications (Cardiovascular):  ?  amLODipine (NORVASC) 10 MG tablet, Take 10 mg by mouth daily. ?  hydrALAZINE (APRESOLINE) 25 MG tablet, TAKE 1 TABLET THREE TIMES DAILY ?  isosorbide dinitrate (ISORDIL) 20 MG tablet, Take 1 tablet (20 mg total) by mouth 3 (three) times daily. ?  losartan (COZAAR) 100 MG tablet, Take 1 tablet (100 mg total) by mouth daily. ? ? ? ? ?Current Outpatient Medications (Other):  ?  COMBIGAN 0.2-0.5 % ophthalmic solution,  ?  dorzolamide (TRUSOPT) 2 % ophthalmic solution, Place 1 drop into the right eye 2 (two) times daily. ?  RHOPRESSA 0.02 % SOLN, Place 1 drop into the right eye at bedtime. ?  tamoxifen (NOLVADEX) 20 MG tablet, Take 1 tablet (20 mg total) by mouth daily. ? ?Medical History:  ?Past Medical History:  ?Diagnosis Date  ? Anxiety   ? anxiousabout impending surgery- loss of breast  ? Arthritis   ? "joints hurt q now and then" (10/25/2013)  ? Breast cancer ([place])   ? Breast cancer of upper-outer quadrant of right female breast ([place]) 03/10/2013  ? Depression   ? history of depression after son died in the 2000's  ? GERD (gastroesophageal reflux disease)   ? "just chemo related"  ? H/O hiatal hernia   ? Hypertension   ?  Radiation 12/08/13-01/17/14  ? Right chest wall 50.4 Gy  ? Spontaneous dislocation of shoulder [phone]  ? "right; a few times over the years"  ? ?Allergies:  ?Allergies  ?Allergen Reactions  ? Effexor [Venlafaxine] Other (See Comments)  ?  Made her nervous  ? Ciprofloxacin Diarrhea  ?  Patient developed severe diarrhea on cipro  ? Penicillins Rash  ?  ? ?Surgical History:  ?She  has a past surgical history that includes Multiple tooth extractions (Bilateral, 2011-2014 X 6); Tonsillectomy; Cesarean section (1980); Portacath placement (Right, 04/15/2013); Port a cath revision (Right, 04/29/2013); Mastectomy complete / simple w/ sentinel node biopsy (Right, 10/25/2013); Port-a-cath removal (10/25/2013); Wisdom tooth extraction; Abdominal hysterectomy (1990's); Breast biopsy (Right, 02/2013); Mastectomy w/ sentinel node biopsy (Right, 10/25/2013); and Port-a-cath removal (N/A, 10/25/2013). ?Family History:  ?Her family history includes Bowel Disease in her mother; Cancer in her father; Heart attack in her maternal uncle; Heart disease in her maternal grandfather, maternal grandmother, and maternal uncle; Hypertension in her mother; Lung disease in her father. ?Social History:  ? reports that she quit smoking about 20 years ago. Her smoking use included cigarettes. She has a 0.75 pack-year smoking history. She has never used smokeless tobacco. She reports current alcohol use. She reports that she does not use drugs. ? ?REVIEW OF SYSTEMS  : All other systems reviewed and negative except where noted in the History of Present Illness. ? ? ?PHYSICAL EXAM: ?There were no vitals taken for this visit. ?General:  Pleasant, well developed female in no acute distress ?Head:  Normocephalic and atraumatic. ?Eyes: {sclerae:26738},conjunctive {conjuctiva:26739}  ?Heart:  {HEART EXAM HEM/ONC:21750} ?Pulm: Clear anteriorly; no wheezing ?Abdomen:  {BlankSingle:19197::"Distended","Ridged","Soft"}, {BlankSingle:19197::"Flat","Obese"} AB,  skin exam {ABDOMEN SKIN EXAM:22649}, {BlankSingle:19197::"Absent","Hyperactive, tinkling","Hypoactive","Sluggish","Normal"} bowel sounds. {Desc; pc desc - abdomen tenderness:5168} tenderness {anatomy; site abdomen:5010}. {BlankMultiple:19196::"Without guarding","With guarding","Without rebound","With rebound"}, {Exam; abdomen organomegaly:15152}. ?Extremities:  {With/Without:304960234} edema. ?Msk:  Symmetrical without gross deformities. Peripheral pulses intact.  ?Neurologic:  Alert and  oriented x4;  grossly normal neurologically. ?Skin:   Dry and intact without significant lesions or rashes. ?Psychiatric: Demonstrates good judgement and reason without abnormal affect or behaviors. ? ? ?Vladimir Crofts, PA-C ?8:38 AM ? ? ?

## 2021-06-17 ENCOUNTER — Ambulatory Visit: Payer: Medicare HMO | Admitting: Physician Assistant

## 2021-07-01 ENCOUNTER — Other Ambulatory Visit: Payer: Self-pay | Admitting: Radiology

## 2021-07-02 ENCOUNTER — Ambulatory Visit (HOSPITAL_COMMUNITY)
Admission: RE | Admit: 2021-07-02 | Discharge: 2021-07-02 | Disposition: A | Discharge status: Home / Self Care | Payer: Medicare HMO | Source: Ambulatory Visit | Attending: Physician Assistant | Admitting: Physician Assistant

## 2021-07-02 ENCOUNTER — Encounter (HOSPITAL_COMMUNITY): Payer: Self-pay

## 2021-07-02 DIAGNOSIS — D72822 Plasmacytosis: Secondary | ICD-10-CM | POA: Insufficient documentation

## 2021-07-02 DIAGNOSIS — D751 Secondary polycythemia: Secondary | ICD-10-CM | POA: Diagnosis not present

## 2021-07-02 DIAGNOSIS — D472 Monoclonal gammopathy: Secondary | ICD-10-CM | POA: Diagnosis present

## 2021-07-02 LAB — CBC WITH DIFFERENTIAL/PLATELET
Abs Immature Granulocytes: 0.01 10*3/uL (ref 0.00–0.07)
Basophils Absolute: 0.1 10*3/uL (ref 0.0–0.1)
Basophils Relative: 1 %
Eosinophils Absolute: 0.2 10*3/uL (ref 0.0–0.5)
Eosinophils Relative: 3 %
HCT: 45.4 % (ref 36.0–46.0)
Hemoglobin: 14.9 g/dL (ref 12.0–15.0)
Immature Granulocytes: 0 %
Lymphocytes Relative: 31 %
Lymphs Abs: 2.5 10*3/uL (ref 0.7–4.0)
MCH: 28 pg (ref 26.0–34.0)
MCHC: 32.8 g/dL (ref 30.0–36.0)
MCV: 85.3 fL (ref 80.0–100.0)
Monocytes Absolute: 0.8 10*3/uL (ref 0.1–1.0)
Monocytes Relative: 10 %
Neutro Abs: 4.5 10*3/uL (ref 1.7–7.7)
Neutrophils Relative %: 55 %
Platelets: 309 10*3/uL (ref 150–400)
RBC: 5.32 MIL/uL — ABNORMAL HIGH (ref 3.87–5.11)
RDW: 15.9 % — ABNORMAL HIGH (ref 11.5–15.5)
WBC: 8.1 10*3/uL (ref 4.0–10.5)
nRBC: 0 % (ref 0.0–0.2)

## 2021-07-02 MED ORDER — MIDAZOLAM HCL 2 MG/2ML IJ SOLN
INTRAMUSCULAR | Status: AC | PRN
  Administered 2021-07-02: 1 mg via INTRAVENOUS

## 2021-07-02 MED ORDER — FENTANYL CITRATE (PF) 100 MCG/2ML IJ SOLN
INTRAMUSCULAR | Status: AC
  Filled 2021-07-02: qty 2

## 2021-07-02 MED ORDER — FLUMAZENIL 0.5 MG/5ML IV SOLN
INTRAVENOUS | Status: AC
  Filled 2021-07-02: qty 5

## 2021-07-02 MED ORDER — FENTANYL CITRATE (PF) 100 MCG/2ML IJ SOLN
INTRAMUSCULAR | Status: AC | PRN
  Administered 2021-07-02: 50 ug via INTRAVENOUS

## 2021-07-02 MED ORDER — SODIUM CHLORIDE 0.9 % IV SOLN: INTRAVENOUS | Status: DC

## 2021-07-02 MED ORDER — MIDAZOLAM HCL 2 MG/2ML IJ SOLN
INTRAMUSCULAR | Status: AC
  Filled 2021-07-02: qty 4

## 2021-07-02 MED ORDER — NALOXONE HCL 0.4 MG/ML IJ SOLN
INTRAMUSCULAR | Status: AC
  Filled 2021-07-02: qty 1

## 2021-07-02 MED ORDER — LIDOCAINE HCL (PF) 1 % IJ SOLN
INTRAMUSCULAR | Status: AC | PRN
  Administered 2021-07-02: 10 mL via INTRADERMAL

## 2021-07-02 NOTE — Discharge Instructions (Signed)
Urgent needs - Interventional Radiology on call MD [phone] ? ?Wound - May remove dressing and shower tomorrow.  Keep site clean and dry.  Replace with bandaid as needed.  Do not submerge in tub or water until site healing well.  ?

## 2021-07-02 NOTE — Procedures (Signed)
Interventional Radiology Procedure Note  Procedure: CT guided bone marrow aspiration and biopsy  Complications: None  EBL: < 10 mL  Findings: Aspirate and core biopsy performed of bone marrow in right iliac bone.  Plan: Bedrest supine x 1 hrs  Arlett Goold T. Marvelle Caudill, M.D Pager:  [phone]   

## 2021-07-02 NOTE — Addendum Note (Signed)
Encounter addended by: Tyson Alias, NP on: 07/02/2021 8:27 AM ? Actions taken: SmartForm saved, Clinical Note Signed, Allergies reviewed, Flowsheet accepted

## 2021-07-03 NOTE — Progress Notes (Signed)
? ? ? ?07/04/2021 ?SYMPHONI HELBLING ?[id number] ?1950/08/10 ? ? ?ASSESSMENT AND PLAN:  ? ? ?Esophageal dysphagia ?-     Ambulatory referral to Gastroenterology ?Patient with some reflux, gives history of intermittent vomiting with dysphagia, does have history of chest wall radiation in 2015 with breast cancer.  We will schedule for endoscopy to evaluate further.  Also has history of hiatal hernia ?Will schedule EGD to evaluate for possible H. pylori, eosinophilic esophagitis, peptic ulcer disease, or strictures. ?I discussed risks of EGD with patient today, including risk of sedation, bleeding or perforation.  ?Patient provides understanding and gave verbal consent to proceed. ? ?Abdominal discomfort ?-     DG Abd 1 View; Future ?-     Ambulatory referral to Gastroenterology ?-Very fleeting, question if patient actually has some constipation, evaluate with x-ray. ?We have discussed the risks of bleeding, infection, perforation, medication reactions, and remote risk of death associated with colonoscopy. All questions were answered and the patient acknowledges these risk and wishes to proceed. ? ?Borborygmus ?-     DG Abd 1 View; Future ?-     Ambulatory referral to Gastroenterology ?Evaluate for constipation with x-ray, can add on fiber. ?If EGD and colon negative, can consider Xifaxan. ?Foods to avoid or decrease discussed with the patient. ? ?Other orders ?-     omeprazole (PRILOSEC) 20 MG capsule; Take 1 capsule (20 mg total) by mouth daily before breakfast. 30 mins before food ideally ?-     Na Sulfate-K Sulfate-Mg Sulf 17.5-3.13-1.6 GM/177ML SOLN; Take 1 kit by mouth once for 1 dose. ? ? ? ? ?Patient Care Team: ?Leeroy Cha, MD as PCP - General (Internal Medicine) ?Lahoma Crocker, MD as Consulting Physician (Obstetrics and Gynecology) ? ?HISTORY OF PRESENT ILLNESS: ?71 y.o. female referred by Leeroy Cha,*, with a past medical history of anxiety, right-sided breast cancer status post  radiation therapy to right chest wall 2015, MGUS, (echo 09/2019 EF 03%, grade 2 diastolic) GERD with history of hiatal hernia, hypertension and others listed below presents for evaluation of AB pain ? ?She has never had colonoscopy in the past.  ?Denies family history of GI malignancy. Mother , ([place], [place]) did pass at [place] with "stomach" problems, was a patient here.  ?Pending bone marrow biopsy Tuesday.  ?She has a lot of loud growling noises.  ?She has GERD occ with food getting stuck in esophagus, only gets release until she vomits, last time that happened was in Jan.  ?Very rare AB pain, around umbilicus, will last mins, feels with gas.  ?Will have BM daily, occ straining pending on her diet.  ?Denies melena, hematochezia.  ? ?Walks with walker but able to get on medication ? ?External labs and notes reviewed this visit: ?CBC  07/02/2021  ?HGB 14.9 MCV 85.3 without evidence of anemia ?WBC 8.1 Platelets 309 ?Kidney function 05/21/2021  ?BUN 17 Cr 0.95  ?GFR >60  ?Potassium 3.5   ?LFTs 05/21/2021  ?AST 13 ALT 21 ?Alkphos 91 TBili 0.5 ? ?08/26/2013 CT abdomen pelvis with contrast shows large midline ventral hernia containing small bowel and colon, small bilateral inguinal hernias containing fat, normal liver, gallbladder, adrenal glands, normal spleen pancreas and stomach.  Small bowel and colon otherwise unremarkable.  Status post hysterectomy. ? ?Current Medications:  ? ? ?Current Outpatient Medications (Cardiovascular):  ?  amLODipine (NORVASC) 10 MG tablet, Take 10 mg by mouth daily. ?  hydrALAZINE (APRESOLINE) 25 MG tablet, TAKE 1 TABLET THREE TIMES DAILY ?  losartan (COZAAR) 100  MG tablet, Take 1 tablet (100 mg total) by mouth daily. ? ? ? ? ?Current Outpatient Medications (Other):  ?  COMBIGAN 0.2-0.5 % ophthalmic solution,  ?  dorzolamide (TRUSOPT) 2 % ophthalmic solution, Place 1 drop into the right eye 2 (two) times daily. ?  Na Sulfate-K Sulfate-Mg Sulf 17.5-3.13-1.6 GM/177ML SOLN, Take 1 kit by  mouth once for 1 dose. ?  omeprazole (PRILOSEC) 20 MG capsule, Take 1 capsule (20 mg total) by mouth daily before breakfast. 30 mins before food ideally ?  RHOPRESSA 0.02 % SOLN, Place 1 drop into the right eye at bedtime. ?  tamoxifen (NOLVADEX) 20 MG tablet, Take 1 tablet (20 mg total) by mouth daily. ? ?Medical History:  ?Past Medical History:  ?Diagnosis Date  ? Anxiety   ? anxiousabout impending surgery- loss of breast  ? Arthritis   ? "joints hurt q now and then" (10/25/2013)  ? Breast cancer ([place])   ? Breast cancer of upper-outer quadrant of right female breast ([place]) 03/10/2013  ? Depression   ? history of depression after son died in the 2000's  ? GERD (gastroesophageal reflux disease)   ? "just chemo related"  ? H/O hiatal hernia   ? Hypertension   ? Radiation 12/08/13-01/17/14  ? Right chest wall 50.4 Gy  ? Spontaneous dislocation of shoulder [phone]  ? "right; a few times over the years"  ? ?Allergies:  ?Allergies  ?Allergen Reactions  ? Effexor [Venlafaxine] Other (See Comments)  ?  Made her nervous  ? Ciprofloxacin Diarrhea  ?  Patient developed severe diarrhea on cipro  ? Penicillins Rash  ?  ? ?Surgical History:  ?She  has a past surgical history that includes Multiple tooth extractions (Bilateral, 2011-2014 X 6); Tonsillectomy; Cesarean section (1980); Portacath placement (Right, 04/15/2013); Port a cath revision (Right, 04/29/2013); Mastectomy complete / simple w/ sentinel node biopsy (Right, 10/25/2013); Port-a-cath removal (10/25/2013); Wisdom tooth extraction; Abdominal hysterectomy (1990's); Breast biopsy (Right, 02/2013); Mastectomy w/ sentinel node biopsy (Right, 10/25/2013); and Port-a-cath removal (N/A, 10/25/2013). ?Family History:  ?Her family history includes Bowel Disease in her mother; Cancer in her father; Heart attack in her maternal uncle; Heart disease in her maternal grandfather, maternal grandmother, and maternal uncle; Hypertension in her mother; Lung disease in her father. ?Social  History:  ? reports that she quit smoking about 20 years ago. Her smoking use included cigarettes. She has a 0.75 pack-year smoking history. She has never used smokeless tobacco. She reports current alcohol use. She reports that she does not use drugs. ? ?REVIEW OF SYSTEMS  : All other systems reviewed and negative except where noted in the History of Present Illness. ? ? ?PHYSICAL EXAM: ?BP (!) 162/84   Pulse 78   Ht _0  (1.549 m)   Wt 196 lb (88.9 kg)   BMI 37.03 kg/m?  ?General:   Pleasant, well developed female in no acute distress ?Head:  Normocephalic and atraumatic. ?Eyes: sclerae anicteric,conjunctive pink  ?Heart:  regular rate and rhythm ?Pulm: Clear anteriorly; no wheezing ?Abdomen:   Soft, Obese AB, Active bowel sounds. No tenderness . Without guarding and Without rebound, No organomegaly appreciated. ?Extremities:  Without edema. ?Msk:  Symmetrical without gross deformities, walks long distances with a walker but is able to get on the bed with assistance. . Peripheral pulses intact.  ?Neurologic:  Alert and  oriented x4;  No focal deficits.  ?Skin:   Dry and intact without significant lesions or rashes. ?Psychiatric: Cooperative. Normal mood and affect. ? ? ? ?  Vladimir Crofts, PA-C ?12:45 PM ? ? ?

## 2021-07-04 ENCOUNTER — Encounter: Payer: Self-pay | Admitting: Physician Assistant

## 2021-07-04 ENCOUNTER — Ambulatory Visit (INDEPENDENT_AMBULATORY_CARE_PROVIDER_SITE_OTHER)
Admission: RE | Admit: 2021-07-04 | Discharge: 2021-07-04 | Disposition: A | Discharge status: Home / Self Care | Payer: Medicare HMO | Source: Ambulatory Visit | Attending: Physician Assistant | Admitting: Physician Assistant

## 2021-07-04 ENCOUNTER — Ambulatory Visit: Payer: Medicare HMO | Admitting: Physician Assistant

## 2021-07-04 VITALS — BP 162/84 | HR 78 | Ht 61.0 in | Wt 196.0 lb

## 2021-07-04 DIAGNOSIS — R198 Other specified symptoms and signs involving the digestive system and abdomen: Secondary | ICD-10-CM

## 2021-07-04 DIAGNOSIS — R109 Unspecified abdominal pain: Secondary | ICD-10-CM

## 2021-07-04 DIAGNOSIS — R1319 Other dysphagia: Secondary | ICD-10-CM

## 2021-07-04 MED ORDER — NA SULFATE-K SULFATE-MG SULF 17.5-3.13-1.6 GM/177ML PO SOLN: 1.0000 | Freq: Once | ORAL | 0 refills | Status: AC

## 2021-07-04 MED ORDER — OMEPRAZOLE 20 MG PO CPDR: 20.0000 mg | DELAYED_RELEASE_CAPSULE | Freq: Every day | ORAL | 1 refills | Status: DC

## 2021-07-04 NOTE — Patient Instructions (Addendum)
If you are age 72 or older, your body mass index should be between 23-30. Your Body mass index is 37.03 kg/m?Marland Kitchen If this is out of the aforementioned range listed, please consider follow up with your Primary Care Provider. ? ?If you are age 71 or younger, your body mass index should be between 19-25. Your Body mass index is 37.03 kg/m?Marland Kitchen If this is out of the aformentioned range listed, please consider follow up with your Primary Care Provider.  ? ?________________________________________________________ ? ?The [facility] providers would like to encourage you to use [facility] to communicate with providers for non-urgent requests or questions.  Due to long hold times on the telephone, sending your provider a message by [facility] may be a faster and more efficient way to get a response.  Please allow 48 business hours for a response.  Please remember that this is for non-urgent requests.  ?_______________________________________________________ ? ? ?Please go to the basement to have an xray ? ?You have been scheduled for an endoscopy and colonoscopy. Please follow the written instructions given to you at your visit today. ?Please pick up your prep supplies at the pharmacy within the next 1-3 days. ?If you use inhalers (even only as needed), please bring them with you on the day of your procedure. ? ? ?Please take your proton pump inhibitor medication 30 minutes to 1 hour before meals- this makes it more effective.  ?Will send in pantoprazole 20 mg once daily.  ? ?FIBER SUPPLEMENT ?You can do metamucil or fibercon once or twice a day but if this causes gas/bloating please switch to Benefiber or Citracel.  ?Fiber is good for constipation/diarrhea/irritable bowel syndrome.  ?It can also help with weight loss and can help lower your bad cholesterol.  ?Please do 1 TBSP in the morning in water, coffee, or tea. It can take up to a month before you can see a difference with your bowel movements.  ?It is cheapest from [employer], [employer],  [employer].  ? ?Avoid spicy and acidic foods ?Avoid fatty foods ?Limit your intake of coffee, tea, alcohol, and carbonated drinks ?Work to maintain a healthy weight ?Keep the head of the bed elevated at least 3 inches with blocks or a wedge pillow if you are having any nighttime symptoms ?Stay upright for 2 hours after eating ?Avoid meals and snacks three to four hours before bedtime ? ?Gastroesophageal Reflux Disease, Adult ?Gastroesophageal reflux (GER) happens when acid from the stomach flows up into the tube that connects the mouth and the stomach (esophagus). Normally, food travels down the esophagus and stays in the stomach to be digested. However, when a person has GER, food and stomach acid sometimes move back up into the esophagus. If this becomes a more serious problem, the person may be diagnosed with a disease called gastroesophageal reflux disease (GERD). GERD occurs when the reflux: ?Happens often. ?Causes frequent or severe symptoms. ?Causes problems such as damage to the esophagus. ?When stomach acid comes in contact with the esophagus, the acid may cause inflammation in the esophagus. Over time, GERD may create small holes (ulcers) in the lining of the esophagus. ?What are the causes? ?This condition is caused by a problem with the muscle between the esophagus and the stomach (lower esophageal sphincter, or LES). Normally, the LES muscle closes after food passes through the esophagus to the stomach. When the LES is weakened or abnormal, it does not close properly, and that allows food and stomach acid to go back up into the esophagus. ?The  LES can be weakened by certain dietary substances, medicines, and medical conditions, including: ?Tobacco use. ?Pregnancy. ?Having a hiatal hernia. ?Alcohol use. ?Certain foods and beverages, such as coffee, chocolate, onions, and peppermint. ?What increases the risk? ?You are more likely to develop this condition if you: ?Have an increased body weight. ?Have a  connective tissue disorder. ?Take NSAIDs, such as ibuprofen. ?What are the signs or symptoms? ?Symptoms of this condition include: ?Heartburn. ?Difficult or painful swallowing and the feeling of having a lump in the throat. ?A bitter taste in the mouth. ?Bad breath and having a large amount of saliva. ?Having an upset or bloated stomach and belching. ?Chest pain. Different conditions can cause chest pain. Make sure you see your health care provider if you experience chest pain. ?Shortness of breath or wheezing. ?Ongoing (chronic) cough or a nighttime cough. ?Wearing away of tooth enamel. ?Weight loss. ?How is this diagnosed? ?This condition may be diagnosed based on a medical history and a physical exam. To determine if you have mild or severe GERD, your health care provider may also monitor how you respond to treatment. You may also have tests, including: ?A test to examine your stomach and esophagus with a small camera (endoscopy). ?A test that measures the acidity level in your esophagus. ?A test that measures how much pressure is on your esophagus. ?A barium swallow or modified barium swallow test to show the shape, size, and functioning of your esophagus. ?How is this treated? ?Treatment for this condition may vary depending on how severe your symptoms are. Your health care provider may recommend: ?Changes to your diet. ?Medicine. ?Surgery. ?The goal of treatment is to help relieve your symptoms and to prevent complications. ?Follow these instructions at home: ?Eating and drinking ? ?Follow a diet as recommended by your health care provider. This may involve avoiding foods and drinks such as: ?Coffee and tea, with or without caffeine. ?Drinks that contain alcohol. ?Energy drinks and sports drinks. ?Carbonated drinks or sodas. ?Chocolate and cocoa. ?Peppermint and mint flavorings. ?Garlic and onions. ?Horseradish. ?Spicy and acidic foods, including peppers, chili powder, curry powder, vinegar, hot sauces, and  barbecue sauce. ?Citrus fruit juices and citrus fruits, such as oranges, lemons, and limes. ?Tomato-based foods, such as red sauce, chili, salsa, and pizza with red sauce. ?Fried and fatty foods, such as donuts, french fries, potato chips, and high-fat dressings. ?High-fat meats, such as hot dogs and fatty cuts of red and white meats, such as rib eye steak, sausage, ham, and bacon. ?High-fat dairy items, such as whole milk, butter, and cream cheese. ?Eat small, frequent meals instead of large meals. ?Avoid drinking large amounts of liquid with your meals. ?Avoid eating meals during the 2-3 hours before bedtime. ?Avoid lying down right after you eat. ?Do not exercise right after you eat. ?Lifestyle ? ?Do not use any products that contain nicotine or tobacco. These products include cigarettes, chewing tobacco, and vaping devices, such as e-cigarettes. If you need help quitting, ask your health care provider. ?Try to reduce your stress by using methods such as yoga or meditation. If you need help reducing stress, ask your health care provider. ?If you are overweight, reduce your weight to an amount that is healthy for you. Ask your health care provider for guidance about a safe weight loss goal. ?General instructions ?Pay attention to any changes in your symptoms. ?Take over-the-counter and prescription medicines only as told by your health care provider. Do not take aspirin, ibuprofen, or other NSAIDs unless your  health care provider told you to take these medicines. ?Wear loose-fitting clothing. Do not wear anything tight around your waist that causes pressure on your abdomen. ?Raise (elevate) the head of your bed about 6 inches (15 cm). You can use a wedge to do this. ?Avoid bending over if this makes your symptoms worse. ?Keep all follow-up visits. This is important. ?Contact a health care provider if: ?You have: ?New symptoms. ?Unexplained weight loss. ?Difficulty swallowing or it hurts to swallow. ?Wheezing or a  persistent cough. ?A hoarse voice. ?Your symptoms do not improve with treatment. ?Get help right away if: ?You have sudden pain in your arms, neck, jaw, teeth, or back. ?You suddenly feel sweaty, dizzy, or l

## 2021-07-05 ENCOUNTER — Telehealth: Payer: Self-pay | Admitting: Physician Assistant

## 2021-07-05 LAB — SURGICAL PATHOLOGY

## 2021-07-05 NOTE — Telephone Encounter (Signed)
I called 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 Patient has no CRAB features. Recommend to closely monitor and repeat labs in 3 months. Patient will follow with Dr. Chryl Heck who manages her care for history of breast cancer while on tamoxifen.  ? ?Patient expressed understanding of the plan provided.  ?

## 2021-07-07 NOTE — Progress Notes (Signed)
I agree with the assessment and plan as outlined by Ms. Collier. 

## 2021-07-09 ENCOUNTER — Other Ambulatory Visit: Payer: Self-pay

## 2021-07-09 MED ORDER — OMEPRAZOLE 20 MG PO CPDR: 20.0000 mg | DELAYED_RELEASE_CAPSULE | Freq: Every day | ORAL | 0 refills | Status: DC

## 2021-07-11 ENCOUNTER — Telehealth: Payer: Self-pay | Admitting: Physician Assistant

## 2021-07-11 NOTE — Telephone Encounter (Signed)
Patient is calling because she has questions regarding results. Please advise. ?

## 2021-07-11 NOTE — Telephone Encounter (Signed)
Patient returned call & informed about results and recommendations. Denise Becker would like to schedule a follow up prior to her endo colon to discuss with Estill Bamberg, PA further in detail about the procedure. Denise Becker is scheduled for 08/08/21 at 10:00 am.  ?

## 2021-07-15 ENCOUNTER — Encounter (HOSPITAL_COMMUNITY): Payer: Self-pay | Admitting: Hematology and Oncology

## 2021-08-06 ENCOUNTER — Encounter: Payer: Self-pay | Admitting: Internal Medicine

## 2021-08-08 ENCOUNTER — Ambulatory Visit: Payer: Medicare HMO | Admitting: Physician Assistant

## 2021-08-13 ENCOUNTER — Encounter: Payer: Medicare HMO | Admitting: Internal Medicine

## 2021-08-22 ENCOUNTER — Telehealth: Payer: Self-pay | Admitting: *Deleted

## 2021-08-22 ENCOUNTER — Encounter: Payer: Self-pay | Admitting: *Deleted

## 2021-08-22 NOTE — Telephone Encounter (Signed)
"  Denise Becker with [facility] [phone]).  Hospital provided your name and number to call for authorization to perform dental extraction for [name] here now.  Faxed request previously with no response.  Attempted call transfer.  Raynaldo Opitz of message left for collaborative with above information.  This nurse no longer triages office calls.  Raynaldo Opitz to request current assigned provider with future return calls.    Denise Becker asked, "Isn't Dr. Lorenso Courier the correct provider" and requested "contact information for PA who saw patient last" when this nurse answered question with most recent F/U and future F/U appointments because original provider status inactive effective January 2023.

## 2021-10-03 ENCOUNTER — Other Ambulatory Visit: Payer: Self-pay | Admitting: *Deleted

## 2021-10-03 DIAGNOSIS — D472 Monoclonal gammopathy: Secondary | ICD-10-CM

## 2021-10-04 ENCOUNTER — Other Ambulatory Visit: Payer: Self-pay

## 2021-10-04 ENCOUNTER — Encounter: Payer: Self-pay | Admitting: Hematology and Oncology

## 2021-10-04 ENCOUNTER — Inpatient Hospital Stay: Payer: Medicare HMO | Attending: Hematology and Oncology

## 2021-10-04 ENCOUNTER — Inpatient Hospital Stay (HOSPITAL_BASED_OUTPATIENT_CLINIC_OR_DEPARTMENT_OTHER): Payer: Medicare HMO | Admitting: Hematology and Oncology

## 2021-10-04 VITALS — BP 119/70 | HR 62 | Temp 97.7°F | Wt 203.6 lb

## 2021-10-04 DIAGNOSIS — Z87891 Personal history of nicotine dependence: Secondary | ICD-10-CM | POA: Insufficient documentation

## 2021-10-04 DIAGNOSIS — C50411 Malignant neoplasm of upper-outer quadrant of right female breast: Secondary | ICD-10-CM | POA: Insufficient documentation

## 2021-10-04 DIAGNOSIS — Z79810 Long term (current) use of selective estrogen receptor modulators (SERMs): Secondary | ICD-10-CM | POA: Diagnosis not present

## 2021-10-04 DIAGNOSIS — Z9011 Acquired absence of right breast and nipple: Secondary | ICD-10-CM | POA: Insufficient documentation

## 2021-10-04 DIAGNOSIS — Z170 Estrogen receptor positive status [ER+]: Secondary | ICD-10-CM | POA: Insufficient documentation

## 2021-10-04 DIAGNOSIS — Z923 Personal history of irradiation: Secondary | ICD-10-CM | POA: Insufficient documentation

## 2021-10-04 DIAGNOSIS — Z9221 Personal history of antineoplastic chemotherapy: Secondary | ICD-10-CM | POA: Insufficient documentation

## 2021-10-04 DIAGNOSIS — D472 Monoclonal gammopathy: Secondary | ICD-10-CM | POA: Insufficient documentation

## 2021-10-04 LAB — CBC WITH DIFFERENTIAL (CANCER CENTER ONLY)
Abs Immature Granulocytes: 0.02 10*3/uL (ref 0.00–0.07)
Basophils Absolute: 0.1 10*3/uL (ref 0.0–0.1)
Basophils Relative: 1 %
Eosinophils Absolute: 0.2 10*3/uL (ref 0.0–0.5)
Eosinophils Relative: 3 %
HCT: 42.5 % (ref 36.0–46.0)
Hemoglobin: 14.1 g/dL (ref 12.0–15.0)
Immature Granulocytes: 0 %
Lymphocytes Relative: 31 %
Lymphs Abs: 2.3 10*3/uL (ref 0.7–4.0)
MCH: 27.5 pg (ref 26.0–34.0)
MCHC: 33.2 g/dL (ref 30.0–36.0)
MCV: 83 fL (ref 80.0–100.0)
Monocytes Absolute: 0.8 10*3/uL (ref 0.1–1.0)
Monocytes Relative: 10 %
Neutro Abs: 4.2 10*3/uL (ref 1.7–7.7)
Neutrophils Relative %: 55 %
Platelet Count: 318 10*3/uL (ref 150–400)
RBC: 5.12 MIL/uL — ABNORMAL HIGH (ref 3.87–5.11)
RDW: 15.6 % — ABNORMAL HIGH (ref 11.5–15.5)
WBC Count: 7.5 10*3/uL (ref 4.0–10.5)
nRBC: 0 % (ref 0.0–0.2)

## 2021-10-04 LAB — CMP (CANCER CENTER ONLY)
ALT: 11 U/L (ref 0–44)
AST: 11 U/L — ABNORMAL LOW (ref 15–41)
Albumin: 3.8 g/dL (ref 3.5–5.0)
Alkaline Phosphatase: 67 U/L (ref 38–126)
Anion gap: 4 — ABNORMAL LOW (ref 5–15)
BUN: 14 mg/dL (ref 8–23)
CO2: 26 mmol/L (ref 22–32)
Calcium: 9.6 mg/dL (ref 8.9–10.3)
Chloride: 107 mmol/L (ref 98–111)
Creatinine: 0.83 mg/dL (ref 0.44–1.00)
GFR, Estimated: >60 mL/min (ref >60)
Glucose, Bld: 82 mg/dL (ref 70–99)
Potassium: 3.6 mmol/L (ref 3.5–5.1)
Sodium: 137 mmol/L (ref 135–145)
Total Bilirubin: 0.4 mg/dL (ref 0.3–1.2)
Total Protein: 8.9 g/dL — ABNORMAL HIGH (ref 6.5–8.1)

## 2021-10-04 NOTE — Progress Notes (Signed)
[place] [phone]   [phone]  INITIAL CONSULT NOTE  Patient Care Team: Leeroy Cha, MD as PCP - General (Internal Medicine) Lahoma Crocker, MD as Consulting Physician (Obstetrics and Gynecology)  Hematological/Oncological History #Right breast cancer -03/03/2013: Right breast biopsy: T3 N1, stage IIIA invasive ductal carcinoma, grade 3, estrogen receptor 100% positive, progesterone receptor 100% positive, with an MIB-1 of 20% and no HER-2 amplification -03/04/2014: Right axillary lymph node biopsy was negatibe -08/23/2013: Received 6 cycles of neoadjuvant chemotherapy with docetaxel, doxorubicin and cyclophosphamide. -10/25/2013: Underwent right total mastectomy and right axillary sentinel node biopsy. Pathology revealed ypT3 ypN0 invasive lobular carcinoma, again estrogen and progesterone receptor positive and HER-2 nonamplified; with negative margins -12/08/2013-01/17/2014: Adjuvant radiation to right chest wall. 50.4 Gy in 28 fractions.  -03/01/2014: Started tamoxifen with plans to continue for 10 years.   #Monoclonal gammopathy -03/13/2021: SPEP showed M protein measuring 2.1 g/dL, serum IgG measuring 3,442 mg/dL. Immunofixation showed IgG monocloncal protein with lamda light chain specificity. Lambda free light chains elevated at 641.1 with kappa/lamda ratio at 0.03.  -04/22/2021:Kappa free light chains elevated at 21.5 mg/L, Lambda free light chains elevated at 675.6 mg/L with kappa/lamda ratio at 0.03.  -05/21/2021: Establish care with Dede Query PA-C --Bone survey done on May 24, 2021 showed no evidence of lytic lesions.  Moderate multifocal degenerative changes --Bone marrow aspiration and biopsy done on July 02, 2021 showed normocellular bone marrow for age with plasmacytosis plasma cells 13% plasma cells apparently display lambda light chain excess and very limited staining most suggestive of plasma cell neoplasm  especially in presence of monoclonal protein.  Cytogenetics normal, FISH normal  CHIEF COMPLAINTS/PURPOSE OF CONSULTATION:  Monoclonal gammopathy  HISTORY OF PRESENTING ILLNESS:   Denise Becker 71 y.o. female to the clinic to evaluate for monoclonal gammopathy.  Patient is unaccompanied for this visit.  Patient was last seen by Dr. Jana Hakim on 03/13/2021 for her history of right-sided breast cancer.  She was seen by Murray Hodgkins and also for monoclonal gammopathy.  Since last visit,she continues on tamoxifen and is able to tolerate it very well.   She denies any changes in her breast.   She however has noticed some swelling in her right lower extremity greater than left. She says this has been the way for some time. She denies any chest pain, shortness of breath.  Rest of the pertinent 10 point ROS reviewed and negative  MEDICAL HISTORY:  Past Medical History:  Diagnosis Date   Anxiety    anxiousabout impending surgery- loss of breast   Arthritis    "joints hurt q now and then" (10/25/2013)   Breast cancer ([place])    Breast cancer of upper-outer quadrant of right female breast ([place]) 03/10/2013   Depression    history of depression after son died in the 2000's   GERD (gastroesophageal reflux disease)    "just chemo related"   H/O hiatal hernia    Hypertension    Radiation 12/08/13-01/17/14   Right chest wall 50.4 Gy   Spontaneous dislocation of shoulder [phone]   "right; a few times over the years"    SURGICAL HISTORY: Past Surgical History:  Procedure Laterality Date   ABDOMINAL HYSTERECTOMY  1990's   BREAST BIOPSY Right 02/2013   CESAREAN SECTION  1980   MASTECTOMY COMPLETE / SIMPLE W/ SENTINEL NODE BIOPSY Right 10/25/2013   MASTECTOMY W/ SENTINEL NODE BIOPSY Right 10/25/2013   Procedure: RIGHT TOTAL MASTECTOMY WITH SENTINEL LYMPH NODE BIOPSY;  Surgeon: Edsel Petrin  Dalbert Batman, MD;  Location: [facility];  Service: General;  Laterality: Right;   MULTIPLE TOOTH EXTRACTIONS Bilateral  2011-2014 X 6   PORT A CATH REVISION Right 04/29/2013   Procedure: PORT A CATH REVISION;  Surgeon: Adin Hector, MD;  Location: [facility];  Service: General;  Laterality: Right;   PORT-A-CATH REMOVAL  10/25/2013   PORT-A-CATH REMOVAL N/A 10/25/2013   Procedure: REMOVAL PORT-A-CATH;  Surgeon: Adin Hector, MD;  Location: [facility];  Service: General;  Laterality: N/A;   PORTACATH PLACEMENT Right 04/15/2013   Procedure: INSERTION PORT-A-CATH;  Surgeon: Adin Hector, MD;  Location: [facility];  Service: General;  Laterality: Right;   TONSILLECTOMY     WISDOM TOOTH EXTRACTION      SOCIAL HISTORY: Social History   Socioeconomic History   Marital status: Single    Spouse name: Not on file   Number of children: 1   Years of education: Not on file   Highest education level: Not on file  Occupational History   Not on file  Tobacco Use   Smoking status: Former    Packs/day: 0.25    Years: 3.00    Total pack years: 0.75    Types: Cigarettes    Quit date: 04/12/2001    Years since quitting: 20.4   Smokeless tobacco: Never   Tobacco comments:    "casual smoker; pack would go stale on me"  Vaping Use   Vaping Use: Never used  Substance and Sexual Activity   Alcohol use: Yes    Comment: 10/25/2013 "might have a glass of wine a few times/year"   Drug use: No   Sexual activity: Never    Birth control/protection: Surgical  Other Topics Concern   Not on file  Social History Narrative   Not on file   Social Determinants of Health   Financial Resource Strain: Not on file  Food Insecurity: Not on file  Transportation Needs: Not on file  Physical Activity: Not on file  Stress: Not on file  Social Connections: Not on file  Intimate Partner Violence: Not on file    FAMILY HISTORY: Family History  Problem Relation Age of Onset   Hypertension Mother    Bowel Disease Mother    Lung disease Father    Cancer Father    Heart disease Maternal  Grandmother    Heart disease Maternal Grandfather    Heart disease Maternal Uncle    Heart attack Maternal Uncle     ALLERGIES:  is allergic to effexor [venlafaxine], ciprofloxacin, and penicillins.  MEDICATIONS:  Current Outpatient Medications  Medication Sig Dispense Refill   amLODipine (NORVASC) 10 MG tablet Take 10 mg by mouth daily.     COMBIGAN 0.2-0.5 % ophthalmic solution      dorzolamide (TRUSOPT) 2 % ophthalmic solution Place 1 drop into the right eye 2 (two) times daily.     hydrALAZINE (APRESOLINE) 25 MG tablet TAKE 1 TABLET THREE TIMES DAILY 270 tablet 1   losartan (COZAAR) 100 MG tablet Take 1 tablet (100 mg total) by mouth daily. 7 tablet 0   omeprazole (PRILOSEC) 20 MG capsule Take 1 capsule (20 mg total) by mouth daily before breakfast. 30 mins before food ideally 90 capsule 0   RHOPRESSA 0.02 % SOLN Place 1 drop into the right eye at bedtime.     tamoxifen (NOLVADEX) 20 MG tablet Take 1 tablet (20 mg total) by mouth daily. 90 tablet 4   No current facility-administered medications  for this visit.    REVIEW OF SYSTEMS:   Constitutional: ( - ) fevers, ( - )  chills , ( - ) night sweats Eyes: ( - ) blurriness of vision, ( - ) double vision, ( - ) watery eyes Ears, nose, mouth, throat, and face: ( - ) mucositis, ( - ) sore throat Respiratory: ( - ) cough, ( - ) dyspnea, ( - ) wheezes Cardiovascular: ( - ) palpitation, ( - ) chest discomfort, ( - ) lower extremity swelling Gastrointestinal:  ( - ) nausea, ( - ) heartburn, ( - ) change in bowel habits Skin: ( - ) abnormal skin rashes Lymphatics: ( - ) new lymphadenopathy, ( - ) easy bruising Neurological: ( - ) numbness, ( - ) tingling, ( - ) new weaknesses Behavioral/Psych: ( - ) mood change, ( - ) new changes  All other systems were reviewed with the patient and are negative.  PHYSICAL EXAMINATION: ECOG PERFORMANCE STATUS: 1 - Symptomatic but completely ambulatory  Vitals:   10/04/21 1157  BP: 119/70  Pulse: 62   Temp: 97.7 F (36.5 C)  SpO2: 99%    Filed Weights   10/04/21 1157  Weight: 203 lb 9.6 oz (92.4 kg)    Physical Exam Constitutional:      Appearance: Normal appearance.  Cardiovascular:     Rate and Rhythm: Normal rate and regular rhythm.     Pulses: Normal pulses.     Heart sounds: Normal heart sounds.  Pulmonary:     Effort: Pulmonary effort is normal.     Breath sounds: Normal breath sounds.  Chest:     Comments: Status post right mastectomy.  Left breast normal to inspection and palpation Musculoskeletal:        General: Swelling (Right leg swelling greater than left leg swelling) present.     Cervical back: Normal range of motion and neck supple. No rigidity.  Lymphadenopathy:     Cervical: No cervical adenopathy.  Neurological:     Mental Status: She is alert.      LABORATORY DATA:  I have reviewed the data as listed    Latest Ref Rng & Units 10/04/2021   11:48 AM 07/02/2021    8:10 AM 05/21/2021    2:27 PM  CBC  WBC 4.0 - 10.5 K/uL 7.5  8.1  7.6   Hemoglobin 12.0 - 15.0 g/dL 14.1  14.9  14.7   Hematocrit 36.0 - 46.0 % 42.5  45.4  44.9   Platelets 150 - 400 K/uL 318  309  327        Latest Ref Rng & Units 10/04/2021   11:48 AM 05/21/2021    2:27 PM 04/22/2021   12:27 PM  CMP  Glucose 70 - 99 mg/dL 82  88  87   BUN 8 - 23 mg/dL '14  17  14   ' Creatinine 0.44 - 1.00 mg/dL 0.83  0.95  0.87   Sodium 135 - 145 mmol/L 137  138  140   Potassium 3.5 - 5.1 mmol/L 3.6  3.5  3.5   Chloride 98 - 111 mmol/L 107  106  108   CO2 22 - 32 mmol/L '26  28  26   ' Calcium 8.9 - 10.3 mg/dL 9.6  10.3  9.6   Total Protein 6.5 - 8.1 g/dL 8.9  8.7  8.8   Total Bilirubin 0.3 - 1.2 mg/dL 0.4  0.5  0.3   Alkaline Phos 38 - 126 U/L 67  91  70   AST 15 - 41 U/L '11  13  13   ' ALT 0 - 44 U/L '11  21  14    ' RADIOGRAPHIC STUDIES: I have personally reviewed the radiological images as listed and agreed with the findings in the report. No results found.  ASSESSMENT & PLAN CLARANN HELVEY is a 71 y.o. who presents to the clinic for further evaluation for monoclonal gammopathy.  Based on SPEP from 03/13/2021, there is evidence of M protein measuring 2.1 g/dL. Serum IgG levels was 3,443 mg/dL. Immunofixation showed IgG  monoclonal protein with lambda light chain specificity.Serum free lambda light chains was elevated at 641.1 mg/L and the kappa/lambda ratio was 0.03.   The recommendation is to repeat serologic workup today and check CBC, CMP, multiple myeloma panel, kappa/lambda light chain. Need to check UPEP with 24 hour urine specimen. Bone Survey will be obtained to evaluate for lytic lesions. Additionally, I recommend to proceed with a bone marrow biopsy due to M protein > 1.5 g/dL and abnormal free light chain ratio. Patient will plan to follow up with Dr. Lorenso Courier after bone marrow biopsy to review results and discuss recommendations.   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  Bone survey negative. --Bone marrow biopsy findings reviewed. --CBC with no evidence of anemia, creatinine impairment, hypercalcemia --We will repeat labs in 3 months, she will see Murray Hodgkins with repeat labs and she will return to clinic to see me in 6 months.  # H/O Right breast cancer --Currently on tamoxifen that she started on 03/01/2014 with plans to continue for 10 years.  --Most recent screening mammogram was on 12/20/2020 with no evidence of malignancy. Next mammogram scheduled for 12/23/2021.  I reordered this with the correct order since this was originally ordered as bilateral but since she had right mastectomy, I have ordered unilateral mammogram.  Orders Placed This Encounter  Procedures   MM 3D SCREEN BREAST UNI LEFT    Standing Status:   Future    Standing Expiration Date:   10/05/2022    Order Specific Question:   Reason for Exam (SYMPTOM  OR DIAGNOSIS REQUIRED)    Answer:   History of right breast cancer     Order Specific Question:   Preferred imaging location?    Answer:   [facility]    All questions were answered. The patient knows to call the clinic with any problems, questions or concerns.  I have spent a total of 30 minutes minutes of face-to-face and non-face-to-face time, preparing to see the patient, obtaining and/or reviewing separately obtained history, performing a medically appropriate examination, counseling and educating the patient, ordering tests/procedures, documenting clinical information in the electronic health record,  and care coordination.

## 2021-10-07 LAB — KAPPA/LAMBDA LIGHT CHAINS
Kappa free light chain: 22.3 mg/L — ABNORMAL HIGH (ref 3.3–19.4)
Kappa, lambda light chain ratio: 0.03 — ABNORMAL LOW (ref 0.26–1.65)
Lambda free light chains: 661.6 mg/L — ABNORMAL HIGH (ref 5.7–26.3)

## 2021-10-08 LAB — MULTIPLE MYELOMA PANEL, SERUM
Albumin SerPl Elph-Mcnc: 3.7 g/dL (ref 2.9–4.4)
Albumin/Glob SerPl: 0.9 (ref 0.7–1.7)
Alpha 1: 0.2 g/dL (ref 0.0–0.4)
Alpha2 Glob SerPl Elph-Mcnc: 0.8 g/dL (ref 0.4–1.0)
B-Globulin SerPl Elph-Mcnc: 1.1 g/dL (ref 0.7–1.3)
Gamma Glob SerPl Elph-Mcnc: 2.4 g/dL — ABNORMAL HIGH (ref 0.4–1.8)
Globulin, Total: 4.6 g/dL — ABNORMAL HIGH (ref 2.2–3.9)
IgA: 162 mg/dL (ref 64–422)
IgG (Immunoglobin G), Serum: 3131 mg/dL — ABNORMAL HIGH (ref 586–1602)
IgM (Immunoglobulin M), Srm: 73 mg/dL (ref 26–217)
M Protein SerPl Elph-Mcnc: 2.1 g/dL — ABNORMAL HIGH
Total Protein ELP: 8.3 g/dL (ref 6.0–8.5)

## 2021-10-15 ENCOUNTER — Ambulatory Visit (HOSPITAL_COMMUNITY)
Admission: RE | Admit: 2021-10-15 | Discharge: 2021-10-15 | Disposition: A | Discharge status: Home / Self Care | Payer: Medicare HMO | Source: Ambulatory Visit | Attending: Hematology and Oncology | Admitting: Hematology and Oncology

## 2021-10-15 DIAGNOSIS — Z170 Estrogen receptor positive status [ER+]: Secondary | ICD-10-CM | POA: Insufficient documentation

## 2021-10-15 DIAGNOSIS — C50411 Malignant neoplasm of upper-outer quadrant of right female breast: Secondary | ICD-10-CM | POA: Insufficient documentation

## 2021-11-05 ENCOUNTER — Ambulatory Visit
Admission: RE | Admit: 2021-11-05 | Discharge: 2021-11-05 | Disposition: A | Discharge status: Home / Self Care | Payer: Medicare HMO | Source: Ambulatory Visit | Attending: Hematology and Oncology | Admitting: Hematology and Oncology

## 2021-11-05 DIAGNOSIS — C50411 Malignant neoplasm of upper-outer quadrant of right female breast: Secondary | ICD-10-CM

## 2021-11-06 ENCOUNTER — Telehealth: Payer: Self-pay | Admitting: Physician Assistant

## 2021-11-06 NOTE — Telephone Encounter (Signed)
Patient called requested to speak with Vicie Mutters PA-C regarding possible EGD\colon procedures.

## 2021-11-06 NOTE — Telephone Encounter (Signed)
Patient called in with questions in regards to scheduling procedure & insurance related questions. She has been advised that it is best to complete both egd & colon at the same time given safety reasons as well as prep requirements. Previous Emergency planning/management officer has been sent to her again to give her an overview of what she may expect to pay. Pt verbalized all understanding & prefers to call back to reschedule when ready.

## 2021-11-21 ENCOUNTER — Emergency Department (HOSPITAL_BASED_OUTPATIENT_CLINIC_OR_DEPARTMENT_OTHER): Payer: Medicare HMO

## 2021-11-21 ENCOUNTER — Encounter (HOSPITAL_COMMUNITY): Payer: Self-pay

## 2021-11-21 ENCOUNTER — Other Ambulatory Visit: Payer: Self-pay

## 2021-11-21 ENCOUNTER — Encounter (HOSPITAL_BASED_OUTPATIENT_CLINIC_OR_DEPARTMENT_OTHER): Payer: Self-pay

## 2021-11-21 ENCOUNTER — Inpatient Hospital Stay (HOSPITAL_BASED_OUTPATIENT_CLINIC_OR_DEPARTMENT_OTHER)
Admission: EM | Admit: 2021-11-21 | Discharge: 2021-11-27 | DRG: 309 | Disposition: A | Discharge status: Home / Self Care | Payer: Medicare HMO | Attending: Internal Medicine | Admitting: Internal Medicine

## 2021-11-21 DIAGNOSIS — Z888 Allergy status to other drugs, medicaments and biological substances status: Secondary | ICD-10-CM

## 2021-11-21 DIAGNOSIS — D472 Monoclonal gammopathy: Secondary | ICD-10-CM | POA: Diagnosis present

## 2021-11-21 DIAGNOSIS — N3000 Acute cystitis without hematuria: Secondary | ICD-10-CM

## 2021-11-21 DIAGNOSIS — Z809 Family history of malignant neoplasm, unspecified: Secondary | ICD-10-CM

## 2021-11-21 DIAGNOSIS — M542 Cervicalgia: Secondary | ICD-10-CM

## 2021-11-21 DIAGNOSIS — M5011 Cervical disc disorder with radiculopathy,  high cervical region: Secondary | ICD-10-CM | POA: Diagnosis present

## 2021-11-21 DIAGNOSIS — C50411 Malignant neoplasm of upper-outer quadrant of right female breast: Secondary | ICD-10-CM | POA: Diagnosis present

## 2021-11-21 DIAGNOSIS — Z9011 Acquired absence of right breast and nipple: Secondary | ICD-10-CM

## 2021-11-21 DIAGNOSIS — Z87891 Personal history of nicotine dependence: Secondary | ICD-10-CM

## 2021-11-21 DIAGNOSIS — Z79899 Other long term (current) drug therapy: Secondary | ICD-10-CM

## 2021-11-21 DIAGNOSIS — B951 Streptococcus, group B, as the cause of diseases classified elsewhere: Secondary | ICD-10-CM | POA: Diagnosis present

## 2021-11-21 DIAGNOSIS — Z8249 Family history of ischemic heart disease and other diseases of the circulatory system: Secondary | ICD-10-CM

## 2021-11-21 DIAGNOSIS — Z79810 Long term (current) use of selective estrogen receptor modulators (SERMs): Secondary | ICD-10-CM

## 2021-11-21 DIAGNOSIS — N179 Acute kidney failure, unspecified: Secondary | ICD-10-CM | POA: Diagnosis present

## 2021-11-21 DIAGNOSIS — Z6838 Body mass index (BMI) 38.0-38.9, adult: Secondary | ICD-10-CM

## 2021-11-21 DIAGNOSIS — M5412 Radiculopathy, cervical region: Secondary | ICD-10-CM | POA: Insufficient documentation

## 2021-11-21 DIAGNOSIS — I480 Paroxysmal atrial fibrillation: Secondary | ICD-10-CM | POA: Diagnosis not present

## 2021-11-21 DIAGNOSIS — C9000 Multiple myeloma not having achieved remission: Secondary | ICD-10-CM | POA: Diagnosis present

## 2021-11-21 DIAGNOSIS — Z881 Allergy status to other antibiotic agents status: Secondary | ICD-10-CM

## 2021-11-21 DIAGNOSIS — R7881 Bacteremia: Secondary | ICD-10-CM | POA: Diagnosis present

## 2021-11-21 DIAGNOSIS — K219 Gastro-esophageal reflux disease without esophagitis: Secondary | ICD-10-CM | POA: Diagnosis present

## 2021-11-21 DIAGNOSIS — I959 Hypotension, unspecified: Secondary | ICD-10-CM | POA: Diagnosis present

## 2021-11-21 DIAGNOSIS — Z880 Allergy status to penicillin: Secondary | ICD-10-CM

## 2021-11-21 DIAGNOSIS — K439 Ventral hernia without obstruction or gangrene: Secondary | ICD-10-CM | POA: Diagnosis present

## 2021-11-21 DIAGNOSIS — Z170 Estrogen receptor positive status [ER+]: Secondary | ICD-10-CM

## 2021-11-21 DIAGNOSIS — E441 Mild protein-calorie malnutrition: Secondary | ICD-10-CM | POA: Diagnosis present

## 2021-11-21 DIAGNOSIS — Z9221 Personal history of antineoplastic chemotherapy: Secondary | ICD-10-CM

## 2021-11-21 DIAGNOSIS — Z923 Personal history of irradiation: Secondary | ICD-10-CM

## 2021-11-21 DIAGNOSIS — I10 Essential (primary) hypertension: Secondary | ICD-10-CM | POA: Diagnosis present

## 2021-11-21 DIAGNOSIS — E669 Obesity, unspecified: Secondary | ICD-10-CM | POA: Diagnosis present

## 2021-11-21 LAB — URINALYSIS, ROUTINE W REFLEX MICROSCOPIC
Bilirubin Urine: NEGATIVE
Glucose, UA: NEGATIVE mg/dL
Ketones, ur: NEGATIVE mg/dL
Leukocytes,Ua: NEGATIVE
Nitrite: NEGATIVE
Protein, ur: 30 mg/dL — AB
Specific Gravity, Urine: 1.024 (ref 1.005–1.030)
pH: 5 (ref 5.0–8.0)

## 2021-11-21 LAB — CBC WITH DIFFERENTIAL/PLATELET
Abs Immature Granulocytes: 0 10*3/uL (ref 0.00–0.07)
Band Neutrophils: 30 %
Basophils Absolute: 0 10*3/uL (ref 0.0–0.1)
Basophils Relative: 0 %
Eosinophils Absolute: 0 10*3/uL (ref 0.0–0.5)
Eosinophils Relative: 0 %
HCT: 36 % (ref 36.0–46.0)
Hemoglobin: 11.9 g/dL — ABNORMAL LOW (ref 12.0–15.0)
Lymphocytes Relative: 0 %
Lymphs Abs: 0 10*3/uL — ABNORMAL LOW (ref 0.7–4.0)
MCH: 28.1 pg (ref 26.0–34.0)
MCHC: 33.1 g/dL (ref 30.0–36.0)
MCV: 85.1 fL (ref 80.0–100.0)
Monocytes Absolute: 1.1 10*3/uL — ABNORMAL HIGH (ref 0.1–1.0)
Monocytes Relative: 4 %
Neutro Abs: 25.3 10*3/uL — ABNORMAL HIGH (ref 1.7–7.7)
Neutrophils Relative %: 66 %
Platelets: 164 10*3/uL (ref 150–400)
RBC: 4.23 MIL/uL (ref 3.87–5.11)
RDW: 16.3 % — ABNORMAL HIGH (ref 11.5–15.5)
Smear Review: DECREASED
WBC Morphology: INCREASED
WBC: 26.4 10*3/uL — ABNORMAL HIGH (ref 4.0–10.5)
nRBC: 0 % (ref 0.0–0.2)

## 2021-11-21 LAB — COMPREHENSIVE METABOLIC PANEL
ALT: 15 U/L (ref 0–44)
AST: 17 U/L (ref 15–41)
Albumin: 3.4 g/dL — ABNORMAL LOW (ref 3.5–5.0)
Alkaline Phosphatase: 57 U/L (ref 38–126)
Anion gap: 8 (ref 5–15)
BUN: 44 mg/dL — ABNORMAL HIGH (ref 8–23)
CO2: 25 mmol/L (ref 22–32)
Calcium: 9 mg/dL (ref 8.9–10.3)
Chloride: 100 mmol/L (ref 98–111)
Creatinine, Ser: 1.99 mg/dL — ABNORMAL HIGH (ref 0.44–1.00)
GFR, Estimated: 26 mL/min — ABNORMAL LOW (ref >60)
Glucose, Bld: 89 mg/dL (ref 70–99)
Potassium: 3.7 mmol/L (ref 3.5–5.1)
Sodium: 133 mmol/L — ABNORMAL LOW (ref 135–145)
Total Bilirubin: 0.5 mg/dL (ref 0.3–1.2)
Total Protein: 8.2 g/dL — ABNORMAL HIGH (ref 6.5–8.1)

## 2021-11-21 LAB — LACTIC ACID, PLASMA: Lactic Acid, Venous: 1.3 mmol/L (ref 0.5–1.9)

## 2021-11-21 MED ORDER — HYDROCODONE-ACETAMINOPHEN 5-325 MG PO TABS
1.0000 | ORAL_TABLET | Freq: Once | ORAL | Status: AC
  Administered 2021-11-21: 1 via ORAL
  Filled 2021-11-21: qty 1

## 2021-11-21 MED ORDER — SODIUM CHLORIDE 0.9 % IV SOLN
1.0000 g | Freq: Once | INTRAVENOUS | Status: AC
  Administered 2021-11-21: 1 g via INTRAVENOUS
  Filled 2021-11-21: qty 10

## 2021-11-21 MED ORDER — SODIUM CHLORIDE 0.9 % IV SOLN: INTRAVENOUS | Status: DC

## 2021-11-21 MED ORDER — SODIUM CHLORIDE 0.9 % IV BOLUS
500.0000 mL | Freq: Once | INTRAVENOUS | Status: AC
  Administered 2021-11-21: 500 mL via INTRAVENOUS

## 2021-11-21 MED ORDER — ONDANSETRON 4 MG PO TBDP
4.0000 mg | ORAL_TABLET | Freq: Once | ORAL | Status: AC
Start: 2021-11-21 — End: 2021-11-21
  Administered 2021-11-21: 4 mg via ORAL
  Filled 2021-11-21: qty 1

## 2021-11-21 NOTE — ED Triage Notes (Signed)
Patient here POV from Home.  Endorses Neck Pain and Headache that began this AM that she awoke with. Seen by PCP today and was instructed to Seek ED Evaluation as they had completed Blood Specimen Analysis and was told she had Moderate Leukocytosis. Sent for Possible Imaging.   No Known Fevers. No N/V/D.   NAD Noted during Triage. A&Ox4. GCS 15. Ambulatory with Gilford Rile.

## 2021-11-21 NOTE — ED Provider Notes (Signed)
[place] EMERGENCY DEPT Provider Note   CSN: [id number] Arrival date & time: 11/21/21  1817     History  Chief Complaint  Patient presents with   Neck Pain    Denise Becker is a 71 y.o. female.  Patient sent in by primary care doctor because had an elevated white blood cell count.  Patient with a complaint this morning she woke up with left lateral neck pain very stiff difficult turn head left or right.  And that pain does shoot up into her head.  No true fevers but patient stated yesterday she felt as if maybe she has some chills and maybe a little increased warmth.  Denies any cough upper respiratory symptoms any chest pain any abdominal pain nausea vomiting or diarrhea or any dysuria.  Patient states she has been urinating fine.  Past medical history is significant for being followed by the hematology oncology service at [facility] patient has a history of right breast cancer from 2014 but more recently and December 2022 was diagnosed with multiple myeloma formally in January GABA free light chains and lambda free light chains were elevated.  Patient had a bone survey done on July 24 with no evidence of a lytic lesions.  Bone marrow aspiration and biopsy was done April 4 showed normal cellular bone for age and plasma ceases plasma cells 13%.  Patient last seen by hematology oncology July 7 they a little worried about some leg swelling and she had Doppler studies done which were negative for DVT.  Is currently taking tamoxifen which she has been on long-term for the breast cancer.  For the multiple myeloma.  She is going to be followed.  Best I can tell she was not started on any therapies for that.  Medical history otherwise significant for hypertension gastroesophageal reflux disease patient former smoker quit in 2003.         Home Medications Prior to Admission medications   Medication Sig Start Date End Date Taking? Authorizing Provider  amLODipine (NORVASC) 10 MG  tablet Take 10 mg by mouth daily.    [provider]  COMBIGAN 0.2-0.5 % ophthalmic solution  12/02/18   [provider]  dorzolamide (TRUSOPT) 2 % ophthalmic solution Place 1 drop into the right eye 2 (two) times daily. 08/12/19   [provider]  hydrALAZINE (APRESOLINE) 25 MG tablet TAKE 1 TABLET THREE TIMES DAILY 09/19/20   [name], [name], DO  losartan (COZAAR) 100 MG tablet Take 1 tablet (100 mg total) by mouth daily. 08/18/19   Little, Wenda Overland, MD  omeprazole (PRILOSEC) 20 MG capsule Take 1 capsule (20 mg total) by mouth daily before breakfast. 30 mins before food ideally 07/09/21   Vladimir Crofts, PA-C  RHOPRESSA 0.02 % SOLN Place 1 drop into the right eye at bedtime. 08/15/19   [provider]  tamoxifen (NOLVADEX) 20 MG tablet Take 1 tablet (20 mg total) by mouth daily. 03/13/21   [name], Virgie Dad, MD      Allergies    Effexor [venlafaxine], Ciprofloxacin, and Penicillins    Review of Systems   Review of Systems  Constitutional:  Positive for chills. Negative for fever.  HENT:  Negative for ear pain and sore throat.   Eyes:  Negative for pain and visual disturbance.  Respiratory:  Negative for cough and shortness of breath.   Cardiovascular:  Negative for chest pain and palpitations.  Gastrointestinal:  Negative for abdominal pain and vomiting.  Genitourinary:  Negative for dysuria  and hematuria.  Musculoskeletal:  Positive for neck pain. Negative for arthralgias and back pain.  Skin:  Negative for color change and rash.  Neurological:  Positive for headaches. Negative for seizures and syncope.  All other systems reviewed and are negative.   Physical Exam Updated Vital Signs BP (!) 117/53   Pulse 94   Temp 97.8 F (36.6 C)   Resp 18   Ht 1.549 m ($Remove'5\' 1"'TpdmXwR$ )   Wt 92.4 kg   SpO2 94%   BMI 38.49 kg/m  Physical Exam Vitals and nursing note reviewed.  Constitutional:      General: She is not in acute distress.    Appearance: Normal  appearance. She is well-developed. She is not ill-appearing.  HENT:     Head: Normocephalic and atraumatic.     Mouth/Throat:     Mouth: Mucous membranes are moist.  Eyes:     Extraocular Movements: Extraocular movements intact.     Conjunctiva/sclera: Conjunctivae normal.     Pupils: Pupils are equal, round, and reactive to light.  Neck:     Comments: Mild tenderness to palpation to the left sternocleidomastoid muscle.  Does have some restricted range of motion left to right no posterior neck pain.  Patient states pain does radiate up into the side of the head on that side but definitely coming from the neck.  Seems to be consistent with a torticollis picture. Cardiovascular:     Rate and Rhythm: Normal rate and regular rhythm.     Heart sounds: No murmur heard. Pulmonary:     Effort: Pulmonary effort is normal. No respiratory distress.     Breath sounds: Normal breath sounds. No wheezing or rales.  Abdominal:     General: There is no distension.     Palpations: Abdomen is soft.     Tenderness: There is no abdominal tenderness.  Musculoskeletal:        General: No swelling.     Cervical back: Normal range of motion and neck supple.     Right lower leg: No edema.     Left lower leg: No edema.  Skin:    General: Skin is warm and dry.     Capillary Refill: Capillary refill takes less than 2 seconds.  Neurological:     General: No focal deficit present.     Mental Status: She is alert and oriented to person, place, and time.     Cranial Nerves: No cranial nerve deficit.     Sensory: No sensory deficit.  Psychiatric:        Mood and Affect: Mood normal.     ED Results / Procedures / Treatments   Labs (all labs ordered are listed, but only abnormal results are displayed) Labs Reviewed  COMPREHENSIVE METABOLIC PANEL - Abnormal; Notable for the following components:      Result Value   Sodium 133 (*)    BUN 44 (*)    Creatinine, Ser 1.99 (*)    Total Protein 8.2 (*)     Albumin 3.4 (*)    GFR, Estimated 26 (*)    All other components within normal limits  CBC WITH DIFFERENTIAL/PLATELET - Abnormal; Notable for the following components:   WBC 26.4 (*)    Hemoglobin 11.9 (*)    RDW 16.3 (*)    Neutro Abs 25.3 (*)    Lymphs Abs 0.0 (*)    Monocytes Absolute 1.1 (*)    All other components within normal limits  URINALYSIS, ROUTINE W REFLEX  MICROSCOPIC - Abnormal; Notable for the following components:   APPearance HAZY (*)    Hgb urine dipstick MODERATE (*)    Protein, ur 30 (*)    Bacteria, UA MANY (*)    All other components within normal limits  CULTURE, BLOOD (ROUTINE X 2)  CULTURE, BLOOD (ROUTINE X 2)  URINE CULTURE  LACTIC ACID, PLASMA  CBC WITH DIFFERENTIAL/PLATELET  LACTIC ACID, PLASMA    EKG None  Radiology CT CHEST ABDOMEN PELVIS WO CONTRAST  Result Date: 11/21/2021 CLINICAL DATA:  Abdominal pain, acute, nonlocalized. Elevated white count EXAM: CT CHEST, ABDOMEN AND PELVIS WITHOUT CONTRAST TECHNIQUE: Multidetector CT imaging of the chest, abdomen and pelvis was performed following the standard protocol without IV contrast. RADIATION DOSE REDUCTION: This exam was performed according to the departmental dose-optimization program which includes automated exposure control, adjustment of the mA and/or kV according to patient size and/or use of iterative reconstruction technique. COMPARISON:  08/26/2013 FINDINGS: CT CHEST FINDINGS Cardiovascular: Heart is normal size. Aorta normal caliber. Scattered aortic calcifications. Mediastinum/Nodes: No mediastinal, hilar, or axillary adenopathy. Trachea and esophagus are unremarkable. Thyroid unremarkable. Lungs/Pleura: Bibasilar opacities, likely atelectasis. No effusions or confluent opacities. Musculoskeletal: Prior right mastectomy. Chest wall soft tissues unremarkable. No acute bony abnormality. CT ABDOMEN PELVIS FINDINGS Hepatobiliary: No focal hepatic abnormality. Gallbladder unremarkable. Pancreas: No  focal abnormality or ductal dilatation. Spleen: No focal abnormality.  Normal size. Adrenals/Urinary Tract: Adrenal glands unremarkable. 2.3 cm low-density lesion in the midpole of the left kidney, likely cyst. No follow-up imaging recommended. No stones or hydronephrosis. Urinary bladder decompressed, grossly unremarkable. Stomach/Bowel: Sigmoid diverticulosis. No active diverticulitis. Stomach and small bowel decompressed, unremarkable. Large ventral hernia containing much of the small bowel as well as the right colon and transverse colon. A portion of the distal stomach is also within the hernia. No bowel obstruction. Vascular/Lymphatic: Scattered aortic atherosclerosis. No evidence of aneurysm or adenopathy. Reproductive: Prior hysterectomy.  No adnexal masses. Other: No free fluid or free air. Musculoskeletal: No acute bony abnormality. IMPRESSION: No acute findings in the chest, abdomen or pelvis. Bibasilar atelectasis. Large midline ventral hernia containing distal stomach, much of the small bowel, right colon and transverse colon. No bowel obstruction. Aortic atherosclerosis. Sigmoid diverticulosis. Electronically Signed   By: Rolm Baptise M.D.   On: 11/21/2021 22:20   CT Soft Tissue Neck Wo Contrast  Result Date: 11/21/2021 CLINICAL DATA:  Soft tissue swelling EXAM: CT NECK WITHOUT CONTRAST TECHNIQUE: Multidetector CT imaging of the neck was performed following the standard protocol without intravenous contrast. RADIATION DOSE REDUCTION: This exam was performed according to the departmental dose-optimization program which includes automated exposure control, adjustment of the mA and/or kV according to patient size and/or use of iterative reconstruction technique. COMPARISON:  None Available. FINDINGS: PHARYNX AND LARYNX: The nasopharynx, oropharynx and larynx are normal. Visible portions of the oral cavity, tongue base and floor of mouth are normal. Normal epiglottis, vallecula and pyriform sinuses. The  larynx is normal. No retropharyngeal abscess, effusion or lymphadenopathy. SALIVARY GLANDS: Normal parotid, submandibular and sublingual glands. THYROID: Normal. LYMPH NODES: No enlarged or abnormal density lymph nodes. VASCULAR: Negative LIMITED INTRACRANIAL: Normal. VISUALIZED ORBITS: Normal. MASTOIDS AND VISUALIZED PARANASAL SINUSES: No fluid levels or advanced mucosal thickening. No mastoid effusion. SKELETON: No bony spinal canal stenosis. No lytic or blastic lesions. UPPER CHEST: Clear. OTHER: None. IMPRESSION: Normal CT of the neck. Electronically Signed   By: Ulyses Jarred M.D.   On: 11/21/2021 22:20    Procedures Procedures    Medications  Ordered in ED Medications  0.9 %  sodium chloride infusion ( Intravenous New Bag/Given 11/21/21 2201)  HYDROcodone-acetaminophen (NORCO/VICODIN) 5-325 MG per tablet 1 tablet (1 tablet Oral Given 11/21/21 1948)  ondansetron (ZOFRAN-ODT) disintegrating tablet 4 mg (4 mg Oral Given 11/21/21 1950)  sodium chloride 0.9 % bolus 500 mL ( Intravenous Stopped 11/21/21 2231)  cefTRIAXone (ROCEPHIN) 1 g in sodium chloride 0.9 % 100 mL IVPB (1 g Intravenous New Bag/Given 11/21/21 2338)    ED Course/ Medical Decision Making/ A&P                           Medical Decision Making Amount and/or Complexity of Data Reviewed Labs: ordered. Radiology: ordered.  Risk Prescription drug management. Decision regarding hospitalization.   Work-up we were unable to check the labs from her primary care doctor.  So we had to repeat them here.  Patient CBC had a white blood cell count of 26.4 thousand with a hemoglobin 11.9.  Platelets normal at 164.  Differential on this was 25.3% absolute neutrophils mono absolutes were up a little bit at 1.1.  Patient's lactic acid was normal at 1.3.  No fevers here urine hazy RBCs 21-50 no significant white blood cells and many bacteria.  So possible urinary tract infection.  Urine culture sent.  Blood culture sent.  Patient's vital signs not  consistent with septic parameters.  And patient really nontoxic.  Based on this did go ahead and CT soft tissue neck abdomen pelvis and chest.  Rising finding of acute kidney injury.  Patient's complete metabolic panel had a sodium of 133 glucose was normal at 89 CO2 normal at 25 BUN 44 creatinine 1.99 for a GFR of 26 just in the earlier part of the year her GFR was greater than 60.  LFTs otherwise normal.  Calcium was normal 2.  Protein was elevated at 8.2.  Based on the questionable urinary tract infection patient started on Rocephin does have penicillin allergy was done okay with cephalosporins in the past.  Given some IV fluids for the acute kidney injury patient still urinating.  Patient states that she has been drinking fine and urinating fine.  So hopefully there is no intrinsic kidney injury.  Patient needs admission for this we will discuss with the hospitalist for admission.   Final Clinical Impression(s) / ED Diagnoses Final diagnoses:  AKI (acute kidney injury) ([place])  Acute cystitis without hematuria  Multiple myeloma not having achieved remission [facility])    Rx / DC Orders ED Discharge Orders     None         Fredia Sorrow, MD 11/22/21 [phone]

## 2021-11-22 ENCOUNTER — Encounter (HOSPITAL_COMMUNITY): Payer: Self-pay | Admitting: Internal Medicine

## 2021-11-22 ENCOUNTER — Observation Stay (HOSPITAL_COMMUNITY): Payer: Medicare HMO

## 2021-11-22 DIAGNOSIS — M5412 Radiculopathy, cervical region: Secondary | ICD-10-CM | POA: Insufficient documentation

## 2021-11-22 DIAGNOSIS — E669 Obesity, unspecified: Secondary | ICD-10-CM | POA: Diagnosis present

## 2021-11-22 DIAGNOSIS — I959 Hypotension, unspecified: Secondary | ICD-10-CM | POA: Diagnosis present

## 2021-11-22 DIAGNOSIS — N179 Acute kidney failure, unspecified: Secondary | ICD-10-CM | POA: Diagnosis not present

## 2021-11-22 DIAGNOSIS — E441 Mild protein-calorie malnutrition: Secondary | ICD-10-CM | POA: Diagnosis present

## 2021-11-22 DIAGNOSIS — M542 Cervicalgia: Secondary | ICD-10-CM

## 2021-11-22 LAB — CBC WITH DIFFERENTIAL/PLATELET
Abs Immature Granulocytes: 0.6 10*3/uL — ABNORMAL HIGH (ref 0.00–0.07)
Band Neutrophils: 35 %
Basophils Absolute: 0 10*3/uL (ref 0.0–0.1)
Basophils Relative: 0 %
Eosinophils Absolute: 0 10*3/uL (ref 0.0–0.5)
Eosinophils Relative: 0 %
HCT: 38.2 % (ref 36.0–46.0)
Hemoglobin: 12.7 g/dL (ref 12.0–15.0)
Lymphocytes Relative: 5 %
Lymphs Abs: 1 10*3/uL (ref 0.7–4.0)
MCH: 28.2 pg (ref 26.0–34.0)
MCHC: 33.2 g/dL (ref 30.0–36.0)
MCV: 84.7 fL (ref 80.0–100.0)
Metamyelocytes Relative: 3 %
Monocytes Absolute: 0.6 10*3/uL (ref 0.1–1.0)
Monocytes Relative: 3 %
Neutro Abs: 18.2 10*3/uL — ABNORMAL HIGH (ref 1.7–7.7)
Neutrophils Relative %: 54 %
Platelets: 196 10*3/uL (ref 150–400)
RBC: 4.51 MIL/uL (ref 3.87–5.11)
RDW: 16.7 % — ABNORMAL HIGH (ref 11.5–15.5)
WBC: 20.4 10*3/uL — ABNORMAL HIGH (ref 4.0–10.5)
nRBC: 0 % (ref 0.0–0.2)

## 2021-11-22 LAB — LACTIC ACID, PLASMA: Lactic Acid, Venous: 1.5 mmol/L (ref 0.5–1.9)

## 2021-11-22 LAB — BLOOD CULTURE ID PANEL (REFLEXED) - BCID2

## 2021-11-22 MED ORDER — DORZOLAMIDE HCL 2 % OP SOLN
1.0000 [drp] | Freq: Two times a day (BID) | OPHTHALMIC | Status: DC
Start: 2021-11-22 — End: 2021-11-22

## 2021-11-22 MED ORDER — CEFAZOLIN SODIUM-DEXTROSE 2-4 GM/100ML-% IV SOLN
2.0000 g | Freq: Two times a day (BID) | INTRAVENOUS | Status: DC
  Administered 2021-11-22 – 2021-11-23 (×2): 2 g via INTRAVENOUS
  Filled 2021-11-22 (×2): qty 100

## 2021-11-22 MED ORDER — ONDANSETRON HCL 4 MG PO TABS: 4.0000 mg | ORAL_TABLET | Freq: Four times a day (QID) | ORAL | Status: DC | PRN

## 2021-11-22 MED ORDER — TIZANIDINE HCL 4 MG PO TABS
4.0000 mg | ORAL_TABLET | Freq: Four times a day (QID) | ORAL | Status: DC | PRN
  Administered 2021-11-22 (×2): 4 mg via ORAL
  Filled 2021-11-22 (×2): qty 2

## 2021-11-22 MED ORDER — ALPRAZOLAM 0.25 MG PO TABS
0.2500 mg | ORAL_TABLET | Freq: Once | ORAL | Status: AC | PRN
  Administered 2021-11-22: 0.25 mg via ORAL
  Filled 2021-11-22: qty 1

## 2021-11-22 MED ORDER — METHOCARBAMOL 500 MG PO TABS
750.0000 mg | ORAL_TABLET | Freq: Four times a day (QID) | ORAL | Status: DC | PRN
Start: 2021-11-22 — End: 2021-11-27
  Administered 2021-11-22 – 2021-11-27 (×5): 750 mg via ORAL
  Filled 2021-11-22 (×5): qty 2

## 2021-11-22 MED ORDER — TAMOXIFEN CITRATE 10 MG PO TABS
20.0000 mg | ORAL_TABLET | Freq: Every day | ORAL | Status: DC
  Administered 2021-11-23 – 2021-11-27 (×5): 20 mg via ORAL
  Filled 2021-11-22 (×5): qty 2

## 2021-11-22 MED ORDER — NETARSUDIL DIMESYLATE 0.02 % OP SOLN: 1.0000 [drp] | Freq: Every day | OPHTHALMIC | Status: DC

## 2021-11-22 MED ORDER — ACETAMINOPHEN 325 MG PO TABS
650.0000 mg | ORAL_TABLET | Freq: Four times a day (QID) | ORAL | Status: DC | PRN
  Administered 2021-11-22 – 2021-11-25 (×3): 650 mg via ORAL
  Filled 2021-11-22 (×3): qty 2

## 2021-11-22 MED ORDER — PANTOPRAZOLE SODIUM 40 MG PO TBEC
40.0000 mg | DELAYED_RELEASE_TABLET | Freq: Every day | ORAL | Status: DC
  Administered 2021-11-22 – 2021-11-27 (×6): 40 mg via ORAL
  Filled 2021-11-22 (×6): qty 1

## 2021-11-22 MED ORDER — LACTATED RINGERS IV BOLUS
1000.0000 mL | Freq: Once | INTRAVENOUS | Status: AC
  Administered 2021-11-22: 1000 mL via INTRAVENOUS

## 2021-11-22 MED ORDER — SODIUM CHLORIDE 0.9 % IV SOLN
1.0000 g | INTRAVENOUS | Status: DC
  Filled 2021-11-22: qty 10

## 2021-11-22 MED ORDER — ONDANSETRON HCL 4 MG/2ML IJ SOLN
4.0000 mg | Freq: Four times a day (QID) | INTRAMUSCULAR | Status: DC | PRN
  Administered 2021-11-26: 4 mg via INTRAVENOUS
  Filled 2021-11-22: qty 2

## 2021-11-22 MED ORDER — ACETAMINOPHEN 650 MG RE SUPP: 650.0000 mg | Freq: Four times a day (QID) | RECTAL | Status: DC | PRN

## 2021-11-22 MED ORDER — ENOXAPARIN SODIUM 30 MG/0.3ML IJ SOSY
30.0000 mg | PREFILLED_SYRINGE | INTRAMUSCULAR | Status: DC
  Administered 2021-11-22: 30 mg via SUBCUTANEOUS
  Filled 2021-11-22: qty 0.3

## 2021-11-22 NOTE — Plan of Care (Signed)

## 2021-11-22 NOTE — Progress Notes (Signed)
PHARMACY - PHYSICIAN COMMUNICATION CRITICAL VALUE ALERT - BLOOD CULTURE IDENTIFICATION (BCID)  Denise Becker is an 71 y.o. female who presented to [facility] on 11/21/2021 with a chief complaint of abnormal lab test  Assessment:  group B strep 4/4, no resistance  Name of physician (or Provider) Contacted: Hal Hope  Current antibiotics: CTX 1gm IV q24h  Changes to prescribed antibiotics recommended:  D/c CTX start ancef 2gm IV q12h (renally dosed)  Results for orders placed or performed during the hospital encounter of 11/21/21  Blood Culture ID Panel (Reflexed) (Collected: 11/21/2021  9:38 PM)  Result Value Ref Range   Enterococcus faecalis NOT DETECTED NOT DETECTED   Enterococcus Faecium NOT DETECTED NOT DETECTED   Listeria monocytogenes NOT DETECTED NOT DETECTED   Staphylococcus species NOT DETECTED NOT DETECTED   Staphylococcus aureus (BCID) NOT DETECTED NOT DETECTED   Staphylococcus epidermidis NOT DETECTED NOT DETECTED   Staphylococcus lugdunensis NOT DETECTED NOT DETECTED   Streptococcus species DETECTED (A) NOT DETECTED   Streptococcus agalactiae DETECTED (A) NOT DETECTED   Streptococcus pneumoniae NOT DETECTED NOT DETECTED   Streptococcus pyogenes NOT DETECTED NOT DETECTED   A.calcoaceticus-baumannii NOT DETECTED NOT DETECTED   Bacteroides fragilis NOT DETECTED NOT DETECTED   Enterobacterales NOT DETECTED NOT DETECTED   Enterobacter cloacae complex NOT DETECTED NOT DETECTED   Escherichia coli NOT DETECTED NOT DETECTED   Klebsiella aerogenes NOT DETECTED NOT DETECTED   Klebsiella oxytoca NOT DETECTED NOT DETECTED   Klebsiella pneumoniae NOT DETECTED NOT DETECTED   Proteus species NOT DETECTED NOT DETECTED   Salmonella species NOT DETECTED NOT DETECTED   Serratia marcescens NOT DETECTED NOT DETECTED   Haemophilus influenzae NOT DETECTED NOT DETECTED   Neisseria meningitidis NOT DETECTED NOT DETECTED   Pseudomonas aeruginosa NOT DETECTED NOT DETECTED    Stenotrophomonas maltophilia NOT DETECTED NOT DETECTED   Candida albicans NOT DETECTED NOT DETECTED   Candida auris NOT DETECTED NOT DETECTED   Candida glabrata NOT DETECTED NOT DETECTED   Candida krusei NOT DETECTED NOT DETECTED   Candida parapsilosis NOT DETECTED NOT DETECTED   Candida tropicalis NOT DETECTED NOT DETECTED   Cryptococcus neoformans/gattii NOT DETECTED NOT DETECTED   Dolly Rias RPh 11/22/2021, 10:32 PM

## 2021-11-22 NOTE — H&P (Signed)
History and Physical    Patient: Denise Becker [id number]:[id number] DOB: 1950/05/20 DOA: 11/21/2021 DOS: the patient was seen and examined on 11/22/2021 PCP: Leeroy Cha, MD  Patient coming from: Home  Chief Complaint:  Chief Complaint  Patient presents with   Neck Pain   HPI: Denise Becker is a 71 y.o. female with medical history significant of anxiety, depression, osteoarthritis, right breast cancer, history of chemo and radiotherapy, GERD, hiatal hernia, hypertension who presented to the emergency department at the request of her doctor due to abnormal blood tests.  He has also been having cervical spine pain since he woke up in the morning.  She also describes occasional numbness of some of her left hand and fingers that has been happening for the past 2 months.  No history of neck trauma.  No fever, chills or night sweats. No sore throat, rhinorrhea, dyspnea, wheezing or hemoptysis.  No chest pain, palpitations, diaphoresis, PND, orthopnea or pitting edema of the lower extremities.  No appetite changes, abdominal pain, diarrhea, constipation, melena or hematochezia.  No flank pain, dysuria, frequency or hematuria.  No polyuria, polydipsia, polyphagia or blurred vision.  ED course: Initial vital signs were temperature 97.8 F, pulse 93, respiration 15, BP 108/71 mmHg and O2 sat 98% on room air.  The patient received hydrocodone 5 mg/acetaminophen 325 mg, ondansetron 4 mg ODT x1, ceftriaxone 1 g IVPB was started on normal saline at 100 mL/h.  Lab work: Her urinalysis was hazy with many bacteria and 6-10 WBC.  CBC showed a white count of 26.4 with 66% neutrophils, hemoglobin 11.9 g/dL platelets 164.  CMP showed sodium 133 mmol/L, BUN was 44, creatinine 1.99 mg/dL.  Total protein is 8.2 and albumin 3.4 g/dL.  The rest of the CMP values were normal.  Lactic acid was unremarkable.  Imaging: CT neck soft tissue was normal.  CT chest, abdomen and pelvis showed bibasilar atelectasis,  large midline ventral hernia containing distal stomach, which showed a small bowel right colon and transverse colon.  No bowel obstruction.  There is aortic atherosclerosis.  There is sigmoid diverticulosis.   Review of Systems: As mentioned in the history of present illness. All other systems reviewed and are negative.  Past Medical History:  Diagnosis Date   Anxiety    anxiousabout impending surgery- loss of breast   Arthritis    "joints hurt q now and then" (10/25/2013)   Breast cancer ([place])    Breast cancer of upper-outer quadrant of right female breast ([place]) 03/10/2013   Depression    history of depression after son died in the 2000's   GERD (gastroesophageal reflux disease)    "just chemo related"   H/O hiatal hernia    Hypertension    Radiation 12/08/13-01/17/14   Right chest wall 50.4 Gy   Spontaneous dislocation of shoulder [id number]   "right; a few times over the years"   Past Surgical History:  Procedure Laterality Date   ABDOMINAL HYSTERECTOMY  1990's   BREAST BIOPSY Right 02/2013   CESAREAN SECTION  1980   MASTECTOMY COMPLETE / SIMPLE W/ SENTINEL NODE BIOPSY Right 10/25/2013   MASTECTOMY W/ SENTINEL NODE BIOPSY Right 10/25/2013   Procedure: RIGHT TOTAL MASTECTOMY WITH SENTINEL LYMPH NODE BIOPSY;  Surgeon: Adin Hector, MD;  Location: [facility];  Service: General;  Laterality: Right;   MULTIPLE TOOTH EXTRACTIONS Bilateral 2011-2014 X 6   PORT A CATH REVISION Right 04/29/2013   Procedure: PORT A CATH REVISION;  Surgeon: Adin Hector, MD;  Location: [facility];  Service: General;  Laterality: Right;   PORT-A-CATH REMOVAL  10/25/2013   PORT-A-CATH REMOVAL N/A 10/25/2013   Procedure: REMOVAL PORT-A-CATH;  Surgeon: Adin Hector, MD;  Location: [facility];  Service: General;  Laterality: N/A;   PORTACATH PLACEMENT Right 04/15/2013   Procedure: INSERTION PORT-A-CATH;  Surgeon: Adin Hector, MD;  Location: [facility];  Service: General;   Laterality: Right;   TONSILLECTOMY     WISDOM TOOTH EXTRACTION     Social History:  reports that she quit smoking about 20 years ago. Her smoking use included cigarettes. She has a 0.75 pack-year smoking history. She has never used smokeless tobacco. She reports current alcohol use. She reports that she does not use drugs.  Allergies  Allergen Reactions   Effexor [Venlafaxine] Other (See Comments)    Made her nervous   Ciprofloxacin Diarrhea    Patient developed severe diarrhea on cipro   Penicillins Rash    Did it involve swelling of the face/tongue/throat, SOB, or low BP? No Did it involve sudden or severe rash/hives, skin peeling, or any reaction on the inside of your mouth or nose? Yes Did you need to seek medical attention at a hospital or doctor's office? No When did it last happen? Several Years Ago       If all above answers are "NO", may proceed with cephalosporin use.      Family History  Problem Relation Age of Onset   Hypertension Mother    Bowel Disease Mother    Lung disease Father    Cancer Father    Heart disease Maternal Grandmother    Heart disease Maternal Grandfather    Heart disease Maternal Uncle    Heart attack Maternal Uncle     Prior to Admission medications   Medication Sig Start Date End Date Taking? Authorizing Provider  amLODipine (NORVASC) 10 MG tablet Take 10 mg by mouth daily.   Yes [provider]  hydrALAZINE (APRESOLINE) 25 MG tablet TAKE 1 TABLET THREE TIMES DAILY 09/19/20  Yes [name], [name], DO  losartan (COZAAR) 100 MG tablet Take 1 tablet (100 mg total) by mouth daily. 08/18/19  Yes Little, Wenda Overland, MD  Multiple Vitamins-Minerals (MULTIVITAMIN ADULT) CHEW Chew 2 tablets by mouth 2 (two) times a week.   Yes [provider]  omeprazole (PRILOSEC) 20 MG capsule Take 1 capsule (20 mg total) by mouth daily before breakfast. 30 mins before food ideally Patient taking differently: Take 20 mg by mouth daily as needed  (indigestion). 07/09/21  Yes Vladimir Crofts, PA-C  tamoxifen (NOLVADEX) 20 MG tablet Take 1 tablet (20 mg total) by mouth daily. 03/13/21  Yes [name], Virgie Dad, MD  tiZANidine (ZANAFLEX) 4 MG tablet Take 4 mg by mouth 3 (three) times daily. 11/21/21  Yes [provider]    Physical Exam: Vitals:   11/22/21 1100 11/22/21 1328 11/22/21 1552 11/22/21 1558  BP: (!) 120/90 93/66 (!) 84/52 (!) 89/59  Pulse:  100 88   Resp: '15 20 18   '$ Temp:  98.4 F (36.9 C) 98.3 F (36.8 C)   TempSrc:  Oral Oral   SpO2:  93% 99%   Weight:      Height:       Physical Exam Vitals and nursing note reviewed.  Constitutional:      General: She is awake. She is not in acute distress.    Appearance: Normal appearance. She is obese. She is ill-appearing. She is  not toxic-appearing.  HENT:     Head: Normocephalic.     Nose: No rhinorrhea.     Mouth/Throat:     Mouth: Mucous membranes are dry.  Eyes:     General: No scleral icterus.    Pupils: Pupils are equal, round, and reactive to light.  Neck:     Vascular: No JVD.     Meningeal: Brudzinski's sign and Kernig's sign absent.     Comments: Left-sided neck tenderness. Cardiovascular:     Rate and Rhythm: Normal rate and regular rhythm.     Heart sounds: S1 normal and S2 normal.  Pulmonary:     Effort: Pulmonary effort is normal.     Breath sounds: Normal breath sounds.  Abdominal:     General: Bowel sounds are normal. There is no distension.     Palpations: Abdomen is soft.     Tenderness: There is no abdominal tenderness. There is no right CVA tenderness, left CVA tenderness or guarding.     Hernia: A hernia is present. Hernia is present in the ventral area.  Musculoskeletal:     Cervical back: Neck supple. Pain with movement and muscular tenderness present.     Right lower leg: No edema.     Left lower leg: No edema.  Lymphadenopathy:     Cervical: No cervical adenopathy.  Skin:    General: Skin is warm and dry.  Neurological:      General: No focal deficit present.     Mental Status: She is alert and oriented to person, place, and time.  Psychiatric:        Mood and Affect: Mood normal.        Behavior: Behavior normal. Behavior is cooperative.    Data Reviewed:  Results are pending, will review when available.  Assessment and Plan: Principal Problem:   Acute kidney injury ([place]) Observation/telemetry. Continue IV fluids. Hold ARB/ACE. Hold diuretic. Avoid hypotension. Avoid nephrotoxins. Monitor intake and output. Monitor renal function electrolytes.  Active Problems:   Neck pain + neuropathic pain and numbness of the left hand. Check MRI of the cervical spine. Methocarbamol and acetaminophen as needed.    Hypotension LR bolus ordered.    Malignant neoplasm of upper-outer quadrant  of right breast in female, estrogen receptor positive (HCC) Continue tamoxifen 20 mg p.o. daily. Follow-up with oncology as scheduled.    Essential hypertension, benign Hold antihypertensives. Monitor blood pressure.    Abdominal wall hernia No tenderness at the moment. Imaging does not show any complications.    Mild protein malnutrition (HCC) Protein supplementation.    Class 2 obesity Lifestyle modifications. Consider nutritional services evaluation as an outpatient    Advance Care Planning:   Code Status: Prior   Consults:   Family Communication:   Severity of Illness: The appropriate patient status for this patient is OBSERVATION. Observation status is judged to be reasonable and necessary in order to provide the required intensity of service to ensure the patient's safety. The patient's presenting symptoms, physical exam findings, and initial radiographic and laboratory data in the context of their medical condition is felt to place them at decreased risk for further clinical deterioration. Furthermore, it is anticipated that the patient will be medically stable for discharge from the hospital  within 2 midnights of admission.   Author: Reubin Milan, MD 11/22/2021 4:08 PM  For on call review [email or web address].   This document was prepared using Dragon voice recognition software and may contain some unintended transcription errors.

## 2021-11-22 NOTE — Progress Notes (Signed)
IV team consult for PIV. Assessed bilateral arms with [name] after receiving permission from MD Olevia Bowens to put PIV in restricted right arm. There are no appropriate veins for PIV at this time. Asking for 2nd assess from night shift IV Team.

## 2021-11-23 ENCOUNTER — Observation Stay (HOSPITAL_COMMUNITY): Payer: Medicare HMO

## 2021-11-23 DIAGNOSIS — Z6838 Body mass index (BMI) 38.0-38.9, adult: Secondary | ICD-10-CM | POA: Diagnosis not present

## 2021-11-23 DIAGNOSIS — I959 Hypotension, unspecified: Secondary | ICD-10-CM | POA: Diagnosis present

## 2021-11-23 DIAGNOSIS — N3000 Acute cystitis without hematuria: Secondary | ICD-10-CM | POA: Diagnosis present

## 2021-11-23 DIAGNOSIS — M5011 Cervical disc disorder with radiculopathy,  high cervical region: Secondary | ICD-10-CM | POA: Diagnosis present

## 2021-11-23 DIAGNOSIS — E441 Mild protein-calorie malnutrition: Secondary | ICD-10-CM | POA: Diagnosis present

## 2021-11-23 DIAGNOSIS — Z87891 Personal history of nicotine dependence: Secondary | ICD-10-CM | POA: Diagnosis not present

## 2021-11-23 DIAGNOSIS — Z9221 Personal history of antineoplastic chemotherapy: Secondary | ICD-10-CM | POA: Diagnosis not present

## 2021-11-23 DIAGNOSIS — N179 Acute kidney failure, unspecified: Secondary | ICD-10-CM | POA: Diagnosis present

## 2021-11-23 DIAGNOSIS — I10 Essential (primary) hypertension: Secondary | ICD-10-CM | POA: Diagnosis present

## 2021-11-23 DIAGNOSIS — B951 Streptococcus, group B, as the cause of diseases classified elsewhere: Secondary | ICD-10-CM | POA: Diagnosis present

## 2021-11-23 DIAGNOSIS — M5412 Radiculopathy, cervical region: Secondary | ICD-10-CM | POA: Diagnosis not present

## 2021-11-23 DIAGNOSIS — F418 Other specified anxiety disorders: Secondary | ICD-10-CM | POA: Diagnosis not present

## 2021-11-23 DIAGNOSIS — Z8249 Family history of ischemic heart disease and other diseases of the circulatory system: Secondary | ICD-10-CM | POA: Diagnosis not present

## 2021-11-23 DIAGNOSIS — I4891 Unspecified atrial fibrillation: Secondary | ICD-10-CM | POA: Diagnosis not present

## 2021-11-23 DIAGNOSIS — R7881 Bacteremia: Secondary | ICD-10-CM | POA: Diagnosis present

## 2021-11-23 DIAGNOSIS — C50411 Malignant neoplasm of upper-outer quadrant of right female breast: Secondary | ICD-10-CM | POA: Diagnosis present

## 2021-11-23 DIAGNOSIS — Z9011 Acquired absence of right breast and nipple: Secondary | ICD-10-CM | POA: Diagnosis not present

## 2021-11-23 DIAGNOSIS — Z809 Family history of malignant neoplasm, unspecified: Secondary | ICD-10-CM | POA: Diagnosis not present

## 2021-11-23 DIAGNOSIS — Z881 Allergy status to other antibiotic agents status: Secondary | ICD-10-CM | POA: Diagnosis not present

## 2021-11-23 DIAGNOSIS — Z923 Personal history of irradiation: Secondary | ICD-10-CM | POA: Diagnosis not present

## 2021-11-23 DIAGNOSIS — Z79899 Other long term (current) drug therapy: Secondary | ICD-10-CM | POA: Diagnosis not present

## 2021-11-23 DIAGNOSIS — Z880 Allergy status to penicillin: Secondary | ICD-10-CM | POA: Diagnosis not present

## 2021-11-23 DIAGNOSIS — K439 Ventral hernia without obstruction or gangrene: Secondary | ICD-10-CM | POA: Diagnosis present

## 2021-11-23 DIAGNOSIS — Z888 Allergy status to other drugs, medicaments and biological substances status: Secondary | ICD-10-CM | POA: Diagnosis not present

## 2021-11-23 DIAGNOSIS — C9000 Multiple myeloma not having achieved remission: Secondary | ICD-10-CM | POA: Diagnosis present

## 2021-11-23 DIAGNOSIS — K219 Gastro-esophageal reflux disease without esophagitis: Secondary | ICD-10-CM | POA: Diagnosis present

## 2021-11-23 DIAGNOSIS — I480 Paroxysmal atrial fibrillation: Secondary | ICD-10-CM | POA: Diagnosis present

## 2021-11-23 LAB — URINE CULTURE

## 2021-11-23 LAB — CBC
HCT: 39 % (ref 36.0–46.0)
Hemoglobin: 12.4 g/dL (ref 12.0–15.0)
MCH: 27.6 pg (ref 26.0–34.0)
MCHC: 31.8 g/dL (ref 30.0–36.0)
MCV: 86.7 fL (ref 80.0–100.0)
Platelets: 196 10*3/uL (ref 150–400)
RBC: 4.5 MIL/uL (ref 3.87–5.11)
RDW: 16.4 % — ABNORMAL HIGH (ref 11.5–15.5)
WBC: 15.2 10*3/uL — ABNORMAL HIGH (ref 4.0–10.5)
nRBC: 0 % (ref 0.0–0.2)

## 2021-11-23 LAB — BASIC METABOLIC PANEL
Anion gap: 5 (ref 5–15)
BUN: 25 mg/dL — ABNORMAL HIGH (ref 8–23)
CO2: 24 mmol/L (ref 22–32)
Calcium: 8.7 mg/dL — ABNORMAL LOW (ref 8.9–10.3)
Chloride: 109 mmol/L (ref 98–111)
Creatinine, Ser: 1.05 mg/dL — ABNORMAL HIGH (ref 0.44–1.00)
GFR, Estimated: 57 mL/min — ABNORMAL LOW (ref >60)
Glucose, Bld: 99 mg/dL (ref 70–99)
Potassium: 3.8 mmol/L (ref 3.5–5.1)
Sodium: 138 mmol/L (ref 135–145)

## 2021-11-23 LAB — GRAM STAIN

## 2021-11-23 LAB — CSF CELL COUNT WITH DIFFERENTIAL
RBC Count, CSF: 1 /mm3 — ABNORMAL HIGH
Tube #: 4
WBC, CSF: 2 /mm3 (ref 0–5)

## 2021-11-23 LAB — GLUCOSE, CSF: Glucose, CSF: 74 mg/dL — ABNORMAL HIGH (ref 40–70)

## 2021-11-23 LAB — PROTEIN, CSF: Total  Protein, CSF: 23 mg/dL (ref 15–45)

## 2021-11-23 MED ORDER — CEFAZOLIN SODIUM-DEXTROSE 2-4 GM/100ML-% IV SOLN
2.0000 g | Freq: Three times a day (TID) | INTRAVENOUS | Status: DC
  Filled 2021-11-23: qty 100

## 2021-11-23 MED ORDER — HYDROMORPHONE HCL 1 MG/ML IJ SOLN
1.0000 mg | INTRAMUSCULAR | Status: DC | PRN
  Administered 2021-11-24: 1 mg via INTRAVENOUS
  Filled 2021-11-23: qty 1

## 2021-11-23 MED ORDER — OXYCODONE HCL 5 MG PO TABS
5.0000 mg | ORAL_TABLET | Freq: Four times a day (QID) | ORAL | Status: DC | PRN
  Administered 2021-11-23 – 2021-11-27 (×8): 5 mg via ORAL
  Filled 2021-11-23 (×8): qty 1

## 2021-11-23 MED ORDER — ENOXAPARIN SODIUM 40 MG/0.4ML IJ SOSY
40.0000 mg | PREFILLED_SYRINGE | INTRAMUSCULAR | Status: DC
  Administered 2021-11-23 – 2021-11-25 (×3): 40 mg via SUBCUTANEOUS
  Filled 2021-11-23 (×3): qty 0.4

## 2021-11-23 MED ORDER — DM-GUAIFENESIN ER 30-600 MG PO TB12
1.0000 | ORAL_TABLET | Freq: Two times a day (BID) | ORAL | Status: AC
Start: 2021-11-24 — End: 2021-11-24
  Administered 2021-11-24 (×2): 1 via ORAL
  Filled 2021-11-23 (×2): qty 1

## 2021-11-23 MED ORDER — SODIUM CHLORIDE 0.9 % IV SOLN
2.0000 g | Freq: Two times a day (BID) | INTRAVENOUS | Status: DC
  Administered 2021-11-23 – 2021-11-24 (×3): 2 g via INTRAVENOUS
  Filled 2021-11-23 (×3): qty 20

## 2021-11-23 MED ORDER — MELATONIN 3 MG PO TABS
3.0000 mg | ORAL_TABLET | Freq: Once | ORAL | Status: AC
  Administered 2021-11-23: 3 mg via ORAL
  Filled 2021-11-23: qty 1

## 2021-11-23 NOTE — Hospital Course (Signed)
Denise Becker is a 71 y.o. F with Br CA s/p neoadjuvant TAC, mastectomy in 2015, adjuvant radiation on tamoxifen, MGUS, anxiety and HTN who presented with neck pain to her PCP and was found to have leukocytosis and sent to the ER where she was found to have AKI and admitted.    8/25: Admitted on antibiotics, fluids, overnight Bcx growing GBS 8/26: Underwent LP, acellular, doubt meningitis 8/27: Improving WBC

## 2021-11-23 NOTE — Progress Notes (Signed)
PHARMACY NOTE:  ANTIMICROBIAL RENAL DOSAGE ADJUSTMENT  Current antimicrobial regimen includes a mismatch between antimicrobial dosage and estimated renal function.  As per policy approved by the Pharmacy & Therapeutics and Medical Executive Committees, the antimicrobial dosage will be adjusted accordingly.  Current antimicrobial dosage:  ancef 2 gm IV q12  Indication: Bacteremia  Renal Function:  Estimated Creatinine Clearance: 50.9 mL/min (A) (by C-G formula based on SCr of 1.05 mg/dL (H)). '[]'$      On intermittent HD, scheduled: '[]'$      On CRRT    Antimicrobial dosage has been changed to:  ancef 2 gm IV q8h   Thank you for allowing pharmacy to be a part of this patient's care.  Eudelia Bunch, Pharm.D 11/23/2021 11:00 AM

## 2021-11-23 NOTE — Assessment & Plan Note (Signed)
Admitted for leukocytosis.  UA with bacteria.  CT chest abdomen pelvis rules out pneumonia, shows no other intraabdominal pathology.  MRI neck shows no abscess.  GBS bacteremia found.  LP ruled out meningitis. - Continue cefazolin - Consult ID - Obtain TEE - Follow repeat blood cultures yesterday

## 2021-11-23 NOTE — Assessment & Plan Note (Signed)
MRI cervical spine shows left disc herniation at C3-4, consistent with the left trapezius pain she has. - Hold NSAIDs given AKI - With infection under control, could consider steroid burst at discharge - Outpatient PT referral

## 2021-11-23 NOTE — Procedures (Addendum)
PROCEDURE SUMMARY:  Successful image-guided lumbar puncture at level of L5-S1.  Opening pressure 29 cm water, closing pressure 15 cm water.  Yielded 22 mL of clear CSF.  No immediate complications.  EBL = trace. Patient tolerated well.   Specimen was sent for labs.  Please see imaging section of Epic for full dictation.  Post procedure order placed - strict bedrest with HOB flat for 2 hours - after 2 hours, HOB may elevated to 2-03- degree  for 15 min at a time to eat/ drink - recommend staying flat for 24 hours  - tylenol for HA,refrain from narcotics use due to risk of rebound HA  - Please contact IR radiologist on call for concerns and questions, no IR APP available till tomorrow.    Armando Gang Jhoan Schmieder PA-C 11/23/2021 3:04 PM

## 2021-11-23 NOTE — Assessment & Plan Note (Addendum)
BMI 38 in setting of hypertension

## 2021-11-23 NOTE — Progress Notes (Signed)
  Progress Note   Patient: Denise Becker ERX:[id number] DOB: Jun 06, 1950 DOA: 11/21/2021     0 DOS: the patient was seen and examined on 11/23/2021 at 11:44am      Brief hospital course: Denise Becker is a 71 y.o. F with Br CA s/p neoadjuvant TAC, mastectomy in 2015, adjuvant radiation on tamoxifen, MGUS, anxiety and HTN who presented with neck pain to her PCP and was found to have leukocytosis and sent to the ER where she was found to have AKI and admitted.      Assessment and Plan: * Bacteremia Admitted for leukocytosis.  UA with bacteria.  CT chest abdomen pelvis rules out pneumonia, shows no other intraabdominal pathology.  MRI neck shows no abscess.  GBS bacteremia found overnight.  Concern for meningitis given age, severe neck pain radiating to the head. - Obtain LP with gram stain, cell counts, protein and glucose; add post-antibiotics culture as well - Transition back to Rocephin q12 dosing    Acute kidney injury ([place]) Cr 1.99 on admission, resolved to baseline 1.0 mg/dL with fluids  Possible cervical radiculopathy Has new acute onset left neck pain, radiating to the head.  She tells me this is new, not a reaggravation of chronic pain, and so I am inclined to think the disc herniation at C3-4 (which would cause left neck and trapezius pain like she has) is incidental and her neck pain is from her GBS.  Morbid obesity (HCC) BMI 38 in setting of hypertension          Subjective: Severe neck pain.  No photophobia, no fever, no confusion.  Neck stiff.  Mild cough.  No urinary symptoms at all.     Physical Exam: Vitals:   11/22/21 2123 11/23/21 0102 11/23/21 0433 11/23/21 1339  BP: 108/81 (!) 90/55 131/84 121/71  Pulse: 85 78 80 88  Resp: '18 18 18   '$ Temp: 99.6 F (37.6 C)  98.2 F (36.8 C) 99.4 F (37.4 C)  TempSrc: Oral  Oral Oral  SpO2: 94% 94% 97% 96%  Weight:      Height:       Obese adult female, lying in bed, appears weak and tired.  "Up on her  right side, hyperextend her neck, unable to move RRR, no murmurs, no peripheral edema Respirations shallow, no rales or wheezes appreciated Abdomen soft no tenderness palpation or guarding Attention normal, affect blunted due to pain, judgment and insight appear normal, unable to do strength testing due to patient unable to move Patient is unable to move her neck even slightest, and has severe pain, radiating up into her head, she has mild swelling of the left arm, she is uncomfortable, her speech is fluent, she is oriented and interactive.     Data Reviewed: Discussed with infectious disease and interventional radiology Blood cell count 26 down to 15 K Urinalysis with bacteria CT of the chest abdomen and pelvis is unremarkable Creatinine and basic metabolic panel is down to 7.09 from 1.99 Blood cultures growing group B strep MRI of the cervical spine shows some C3-4 impingement from herniated disc and some bilateral neuroforaminal stenosis at C5-6     Family Communication: God-Daughter at the bedside    Disposition: Status is: Inpatient         Author: Edwin Dada, MD 11/23/2021 4:35 PM  For on call review [email or web address].

## 2021-11-23 NOTE — Assessment & Plan Note (Signed)
Cr 1.99 on admission, resolved to baseline 1.0 mg/dL with fluids

## 2021-11-24 DIAGNOSIS — M5412 Radiculopathy, cervical region: Secondary | ICD-10-CM | POA: Diagnosis not present

## 2021-11-24 DIAGNOSIS — R7881 Bacteremia: Secondary | ICD-10-CM | POA: Diagnosis not present

## 2021-11-24 DIAGNOSIS — N179 Acute kidney failure, unspecified: Secondary | ICD-10-CM | POA: Diagnosis not present

## 2021-11-24 DIAGNOSIS — I10 Essential (primary) hypertension: Secondary | ICD-10-CM | POA: Diagnosis not present

## 2021-11-24 LAB — COMPREHENSIVE METABOLIC PANEL
ALT: 28 U/L (ref 0–44)
AST: 25 U/L (ref 15–41)
Albumin: 2.6 g/dL — ABNORMAL LOW (ref 3.5–5.0)
Alkaline Phosphatase: 89 U/L (ref 38–126)
Anion gap: 6 (ref 5–15)
BUN: 12 mg/dL (ref 8–23)
CO2: 25 mmol/L (ref 22–32)
Calcium: 8.6 mg/dL — ABNORMAL LOW (ref 8.9–10.3)
Chloride: 109 mmol/L (ref 98–111)
Creatinine, Ser: 0.77 mg/dL (ref 0.44–1.00)
GFR, Estimated: >60 mL/min (ref >60)
Glucose, Bld: 103 mg/dL — ABNORMAL HIGH (ref 70–99)
Potassium: 3.5 mmol/L (ref 3.5–5.1)
Sodium: 140 mmol/L (ref 135–145)
Total Bilirubin: 0.4 mg/dL (ref 0.3–1.2)
Total Protein: 7.5 g/dL (ref 6.5–8.1)

## 2021-11-24 LAB — CULTURE, BLOOD (ROUTINE X 2)

## 2021-11-24 LAB — CBC
HCT: 37.9 % (ref 36.0–46.0)
Hemoglobin: 12.3 g/dL (ref 12.0–15.0)
MCH: 28 pg (ref 26.0–34.0)
MCHC: 32.5 g/dL (ref 30.0–36.0)
MCV: 86.3 fL (ref 80.0–100.0)
Platelets: 200 10*3/uL (ref 150–400)
RBC: 4.39 MIL/uL (ref 3.87–5.11)
RDW: 16.5 % — ABNORMAL HIGH (ref 11.5–15.5)
WBC: 14 10*3/uL — ABNORMAL HIGH (ref 4.0–10.5)
nRBC: 0 % (ref 0.0–0.2)

## 2021-11-24 MED ORDER — CEFAZOLIN SODIUM-DEXTROSE 2-4 GM/100ML-% IV SOLN
2.0000 g | Freq: Three times a day (TID) | INTRAVENOUS | Status: DC
  Administered 2021-11-24 – 2021-11-26 (×5): 2 g via INTRAVENOUS
  Filled 2021-11-24 (×6): qty 100

## 2021-11-24 MED ORDER — NUTRISOURCE FIBER PO PACK
1.0000 | PACK | Freq: Two times a day (BID) | ORAL | Status: DC
  Administered 2021-11-24 – 2021-11-27 (×4): 1
  Filled 2021-11-24 (×8): qty 1

## 2021-11-24 MED ORDER — POLYETHYLENE GLYCOL 3350 17 G PO PACK
17.0000 g | PACK | Freq: Two times a day (BID) | ORAL | Status: DC | PRN
  Administered 2021-11-24: 17 g via ORAL
  Filled 2021-11-24: qty 1

## 2021-11-24 MED ORDER — HYDRALAZINE HCL 25 MG PO TABS
25.0000 mg | ORAL_TABLET | Freq: Three times a day (TID) | ORAL | Status: DC
  Administered 2021-11-24 – 2021-11-27 (×11): 25 mg via ORAL
  Filled 2021-11-24 (×11): qty 1

## 2021-11-24 NOTE — Progress Notes (Signed)
  Progress Note   Patient: Denise Becker [id number]:[id number] DOB: 12-22-1950 DOA: 11/21/2021     1 DOS: the patient was seen and examined on 11/24/2021 at at 9:08AM      Brief hospital course: Denise Becker is a 71 y.o. F with Br CA s/p neoadjuvant TAC, mastectomy in 2015, adjuvant radiation on tamoxifen, MGUS, anxiety and HTN who presented with neck pain to her PCP and was found to have leukocytosis and sent to the ER where she was found to have AKI and admitted.    8/25: Admitted on antibiotics, fluids, overnight Bcx growing GBS 8/26: Underwent LP, acellular, doubt meningitis 8/27: Improving WBC     Assessment and Plan: * GBS bacteremia - Continue antibiotics, transition back to cefazolin  - Repeat blood cultures    Acute kidney injury ([place]) Cr 1.99 on admission, resolved to baseline 1.0 mg/dL with fluids  Cervical radiculopathy MR shows disc herniation at C3-4 (which would cause left neck and trapezius pain like she has)   -Continue oxycodone - PT eval  Morbid obesity (HCC) BMI 38 in setting of hypertension  Essential hypertension BP heading up -R esume home hydralazine - Hold ARB, amlodipine for now  Breast cancer - Continue tamoxifen          Subjective: Still has a lot of neck pain, but improved with oxycodone.     Physical Exam: Vitals:   11/23/21 1751 11/23/21 2121 11/24/21 0508 11/24/21 1141  BP:  (!) 146/84 (!) 153/71 (!) 161/84  Pulse:  88 88 76  Resp: '20 20 18 18  '$ Temp:  98.3 F (36.8 C) 99.3 F (37.4 C) 98 F (36.7 C)  TempSrc:  Oral Oral Oral  SpO2:  94% 96% 97%  Weight:      Height:       Adult female, lying in bed, no acute distress RRR, no murmurs, no peripheral edema Respirations normal, no abnormal lung sounds Abdomen slightly distended, but at baseline, soft, no tenderness palpation or guarding Attention normal, affect normal, judgment and insight appear normal, still guarding the neck        Data Reviewed: Lumbar  puncture shows cell counts 1 RBCs and 2 WBCs in tube 4, protein normal, glucose slightly elevated, Gram stain negative Hypoxia count down to 14 Competency metabolic panel normal except for mildly low hypoalbuminemia      Family Communication:      Disposition: Status is: Inpatient         Author: Edwin Dada, MD 11/24/2021 3:58 PM  For on call review [email or web address].

## 2021-11-25 DIAGNOSIS — M5412 Radiculopathy, cervical region: Secondary | ICD-10-CM | POA: Diagnosis not present

## 2021-11-25 DIAGNOSIS — B951 Streptococcus, group B, as the cause of diseases classified elsewhere: Secondary | ICD-10-CM | POA: Diagnosis not present

## 2021-11-25 DIAGNOSIS — R7881 Bacteremia: Secondary | ICD-10-CM | POA: Diagnosis not present

## 2021-11-25 DIAGNOSIS — N179 Acute kidney failure, unspecified: Secondary | ICD-10-CM | POA: Diagnosis not present

## 2021-11-25 DIAGNOSIS — I10 Essential (primary) hypertension: Secondary | ICD-10-CM | POA: Diagnosis not present

## 2021-11-25 LAB — CBC
HCT: 38.3 % (ref 36.0–46.0)
Hemoglobin: 12.7 g/dL (ref 12.0–15.0)
MCH: 28 pg (ref 26.0–34.0)
MCHC: 33.2 g/dL (ref 30.0–36.0)
MCV: 84.5 fL (ref 80.0–100.0)
Platelets: 220 10*3/uL (ref 150–400)
RBC: 4.53 MIL/uL (ref 3.87–5.11)
RDW: 16 % — ABNORMAL HIGH (ref 11.5–15.5)
WBC: 13.1 10*3/uL — ABNORMAL HIGH (ref 4.0–10.5)
nRBC: 0 % (ref 0.0–0.2)

## 2021-11-25 LAB — BASIC METABOLIC PANEL
Anion gap: 7 (ref 5–15)
BUN: 11 mg/dL (ref 8–23)
CO2: 28 mmol/L (ref 22–32)
Calcium: 9.1 mg/dL (ref 8.9–10.3)
Chloride: 104 mmol/L (ref 98–111)
Creatinine, Ser: 0.78 mg/dL (ref 0.44–1.00)
GFR, Estimated: >60 mL/min (ref >60)
Glucose, Bld: 103 mg/dL — ABNORMAL HIGH (ref 70–99)
Potassium: 3.5 mmol/L (ref 3.5–5.1)
Sodium: 139 mmol/L (ref 135–145)

## 2021-11-25 MED ORDER — LOSARTAN POTASSIUM 50 MG PO TABS
100.0000 mg | ORAL_TABLET | Freq: Every day | ORAL | Status: DC
  Administered 2021-11-25 – 2021-11-27 (×3): 100 mg via ORAL
  Filled 2021-11-25 (×3): qty 2

## 2021-11-25 MED ORDER — IPRATROPIUM-ALBUTEROL 0.5-2.5 (3) MG/3ML IN SOLN
3.0000 mL | RESPIRATORY_TRACT | Status: DC | PRN
  Administered 2021-11-25: 3 mL via RESPIRATORY_TRACT
  Filled 2021-11-25: qty 3

## 2021-11-25 MED ORDER — ALBUTEROL SULFATE (2.5 MG/3ML) 0.083% IN NEBU
2.5000 mg | INHALATION_SOLUTION | Freq: Once | RESPIRATORY_TRACT | Status: AC
Start: 2021-11-25 — End: 2021-11-25
  Administered 2021-11-25: 2.5 mg via RESPIRATORY_TRACT
  Filled 2021-11-25: qty 3

## 2021-11-25 MED ORDER — AMLODIPINE BESYLATE 10 MG PO TABS
10.0000 mg | ORAL_TABLET | Freq: Every day | ORAL | Status: DC
Start: 2021-11-25 — End: 2021-11-27
  Administered 2021-11-25 – 2021-11-27 (×3): 10 mg via ORAL
  Filled 2021-11-25 (×3): qty 1

## 2021-11-25 NOTE — Assessment & Plan Note (Signed)
Blood pressure slightly elevated - Continue home hydralazine, ARB, amlodipine - Start new metoprolol

## 2021-11-25 NOTE — Progress Notes (Signed)
Pt began wheezing with expirations after transferring from bed to chair, dyspneic, c/o feeling "tight." O2 Sats stable at 98% RA. RT notified, Duoneb administered as ordered x1 with much improvement. Pt able to speak much easier. Will continue to monitor closely.

## 2021-11-25 NOTE — Assessment & Plan Note (Signed)
-   Continue tamoxifen 

## 2021-11-25 NOTE — Consult Note (Signed)
[facility]       Reason for Consult:GBS bacteremia    Referring Physician: Dr. Loleta Books  Principal Problem:   Bacteremia Active Problems:   Malignant neoplasm of upper-outer quadrant of right breast in female, estrogen receptor positive ([place])   Essential hypertension, benign   Acute kidney injury ([place])   Hypotension   Mild protein malnutrition (HCC)   Morbid obesity (HCC)   Possible cervical radiculopathy    amLODipine  10 mg Oral Daily   enoxaparin (LOVENOX) injection  40 mg Subcutaneous Q24H   fiber  1 packet Per Tube BID   hydrALAZINE  25 mg Oral Q8H   losartan  100 mg Oral Daily   pantoprazole  40 mg Oral Daily   tamoxifen  20 mg Oral Daily    Recommendations: Repeat blood cultures (sent) I recommend a TEE  Assessment: GBS bacteremia of unknown etiology.  Can be associated with infective endocarditis so would check a TEE.    Antibiotics: Cefazolin day 4  HPI: Denise Becker is a 71 y.o. female with a history of breast CA s/p chemotherapy and radiotherapy, MGUS comes in with leukocytosis noted by PCP and sent in to the ED.  No initial significant findings but now with positive blood cultures.  She has had no cellulitis, no significant other findings.  She did have some recent dental work.  Leukocytosis of 20 but has remained afebrile.  Blood cultures positive in 4/4 bottles.     Review of Systems:  Constitutional: negative for fevers and chills All other systems reviewed and are negative    Past Medical History:  Diagnosis Date   Anxiety    anxiousabout impending surgery- loss of breast   Arthritis    "joints hurt q now and then" (10/25/2013)   Breast cancer ([place])    Breast cancer of upper-outer quadrant of right female breast ([place]) 03/10/2013   Depression    history of depression after son died in the 2000's   GERD (gastroesophageal reflux disease)    "just chemo related"   H/O hiatal hernia    Hypertension     Radiation 12/08/13-01/17/14   Right chest wall 50.4 Gy   Spontaneous dislocation of shoulder [phone]   "right; a few times over the years"    Social History   Tobacco Use   Smoking status: Former    Packs/day: 0.25    Years: 3.00    Total pack years: 0.75    Types: Cigarettes    Quit date: 04/12/2001    Years since quitting: 20.6   Smokeless tobacco: Never   Tobacco comments:    "casual smoker; pack would go stale on me"  Vaping Use   Vaping Use: Never used  Substance Use Topics   Alcohol use: Yes    Comment: 10/25/2013 "might have a glass of wine a few times/year"   Drug use: No    Family History  Problem Relation Age of Onset   Hypertension Mother    Bowel Disease Mother    Lung disease Father    Cancer Father    Heart disease Maternal Grandmother    Heart disease Maternal Grandfather    Heart disease Maternal Uncle    Heart attack Maternal Uncle     Allergies  Allergen Reactions   Effexor [Venlafaxine] Other (See Comments)    Made her nervous   Ciprofloxacin Diarrhea    Patient developed severe diarrhea on cipro   Penicillins Rash  Did it involve swelling of the face/tongue/throat, SOB, or low BP? No Did it involve sudden or severe rash/hives, skin peeling, or any reaction on the inside of your mouth or nose? Yes Did you need to seek medical attention at a hospital or doctor's office? No When did it last happen? Several Years Ago       If all above answers are "NO", may proceed with cephalosporin use.      Physical Exam: Constitutional: in no apparent distress  Vitals:   11/25/21 0452 11/25/21 1336  BP: (!) 160/82 (!) 144/76  Pulse: 81 89  Resp: 18 20  Temp: 98.2 F (36.8 C) 98.8 F (37.1 C)  SpO2: 100% 98%   EYES: anicteric Respiratory: normal respiratory effort Musculoskeletal: no edema Skin: no rash  Lab Results  Component Value Date   WBC 13.1 (H) 11/25/2021   HGB 12.7 11/25/2021   HCT 38.3 11/25/2021   MCV 84.5 11/25/2021   PLT  220 11/25/2021    Lab Results  Component Value Date   CREATININE 0.78 11/25/2021   BUN 11 11/25/2021   NA 139 11/25/2021   K 3.5 11/25/2021   CL 104 11/25/2021   CO2 28 11/25/2021    Lab Results  Component Value Date   ALT 28 11/24/2021   AST 25 11/24/2021   ALKPHOS 89 11/24/2021     Microbiology: Recent Results (from the past 240 hour(s))  Culture, blood (Routine X 2) w Reflex to ID Panel     Status: Abnormal   Collection Time: 11/21/21  9:00 PM   Specimen: BLOOD  Result Value Ref Range Status   Specimen Description   Final    BLOOD Performed at [facility], [address], [place], [place] [address]    Special Requests   Final    NONE Performed at [facility], [address] [place], [place] [address]    Culture  Setup Time   Final    GRAM POSITIVE COCCI IN CHAINS IN BOTH AEROBIC AND ANAEROBIC BOTTLES CRITICAL RESULT CALLED TO, READ BACK BY AND VERIFIED WITH:  C/ PHARMD E. JACKSON 11/22/21 2219 A. LAFRANCE     Culture (A)  Final    GROUP B STREP(S.AGALACTIAE)ISOLATED SUSCEPTIBILITIES PERFORMED ON PREVIOUS CULTURE WITHIN THE LAST 5 DAYS. Performed at [facility], [place] [address], [place], [place] [address]    Report Status 11/24/2021 FINAL  Final  Culture, blood (Routine X 2) w Reflex to ID Panel     Status: Abnormal   Collection Time: 11/21/21  9:38 PM   Specimen: BLOOD  Result Value Ref Range Status   Specimen Description BLOOD LEFT ANTECUBITAL  Final   Special Requests   Final    BOTTLES DRAWN AEROBIC AND ANAEROBIC Blood Culture results may not be optimal due to an inadequate volume of blood received in culture bottles   Culture  Setup Time   Final    GRAM POSITIVE COCCI IN CHAINS IN BOTH AEROBIC AND ANAEROBIC BOTTLES CRITICAL RESULT CALLED TO, READ BACK BY AND VERIFIED WITH:  C/ PHARMD E. JACKSON 11/22/21 2219 A. LAFRANCE  Performed at [facility], [place] [address] [place], [place] [address]    Culture  GROUP B STREP(S.AGALACTIAE)ISOLATED (A)  Final   Report Status 11/24/2021 FINAL  Final   Organism ID, Bacteria GROUP B STREP(S.AGALACTIAE)ISOLATED  Final      Susceptibility   Group b strep(s.agalactiae)isolated - MIC*    CLINDAMYCIN <=0.25 SENSITIVE Sensitive  AMPICILLIN <=0.25 SENSITIVE Sensitive     ERYTHROMYCIN <=0.12 SENSITIVE Sensitive     VANCOMYCIN 0.5 SENSITIVE Sensitive     CEFTRIAXONE <=0.12 SENSITIVE Sensitive     LEVOFLOXACIN 1 SENSITIVE Sensitive     * GROUP B STREP(S.AGALACTIAE)ISOLATED  Blood Culture ID Panel (Reflexed)     Status: Abnormal   Collection Time: 11/21/21  9:38 PM  Result Value Ref Range Status   Enterococcus faecalis NOT DETECTED NOT DETECTED Final   Enterococcus Faecium NOT DETECTED NOT DETECTED Final   Listeria monocytogenes NOT DETECTED NOT DETECTED Final   Staphylococcus species NOT DETECTED NOT DETECTED Final   Staphylococcus aureus (BCID) NOT DETECTED NOT DETECTED Final   Staphylococcus epidermidis NOT DETECTED NOT DETECTED Final   Staphylococcus lugdunensis NOT DETECTED NOT DETECTED Final   Streptococcus species DETECTED (A) NOT DETECTED Final    Comment: CRITICAL RESULT CALLED TO, READ BACK BY AND VERIFIED WITH:  C/ PHARMD E. JACKSON 11/22/21 2219 A. LAFRANCE     Streptococcus agalactiae DETECTED (A) NOT DETECTED Final    Comment: CRITICAL RESULT CALLED TO, READ BACK BY AND VERIFIED WITH:  C/ PHARMD E. JACKSON 11/22/21 2219 A. LAFRANCE     Streptococcus pneumoniae NOT DETECTED NOT DETECTED Final   Streptococcus pyogenes NOT DETECTED NOT DETECTED Final   A.calcoaceticus-baumannii NOT DETECTED NOT DETECTED Final   Bacteroides fragilis NOT DETECTED NOT DETECTED Final   Enterobacterales NOT DETECTED NOT DETECTED Final   Enterobacter cloacae complex NOT DETECTED NOT DETECTED Final   Escherichia coli NOT DETECTED NOT DETECTED Final   Klebsiella aerogenes NOT DETECTED NOT DETECTED Final   Klebsiella oxytoca NOT DETECTED NOT DETECTED Final    Klebsiella pneumoniae NOT DETECTED NOT DETECTED Final   Proteus species NOT DETECTED NOT DETECTED Final   Salmonella species NOT DETECTED NOT DETECTED Final   Serratia marcescens NOT DETECTED NOT DETECTED Final   Haemophilus influenzae NOT DETECTED NOT DETECTED Final   Neisseria meningitidis NOT DETECTED NOT DETECTED Final   Pseudomonas aeruginosa NOT DETECTED NOT DETECTED Final   Stenotrophomonas maltophilia NOT DETECTED NOT DETECTED Final   Candida albicans NOT DETECTED NOT DETECTED Final   Candida auris NOT DETECTED NOT DETECTED Final   Candida glabrata NOT DETECTED NOT DETECTED Final   Candida krusei NOT DETECTED NOT DETECTED Final   Candida parapsilosis NOT DETECTED NOT DETECTED Final   Candida tropicalis NOT DETECTED NOT DETECTED Final   Cryptococcus neoformans/gattii NOT DETECTED NOT DETECTED Final    Comment: Performed at [facility], [address], [place], [place] [address]  Urine Culture     Status: Abnormal   Collection Time: 11/21/21 11:34 PM   Specimen: Urine, Clean Catch  Result Value Ref Range Status   Specimen Description   Final    URINE, CLEAN CATCH Performed at [facility], [address], [place], [place] [address]    Special Requests   Final    NONE Performed at [facility], [address], [place], [place] [address]    Culture MULTIPLE SPECIES PRESENT, SUGGEST RECOLLECTION (A)  Final   Report Status 11/23/2021 FINAL  Final  Gram stain     Status: None   Collection Time: 11/23/21  3:00 PM   Specimen: PATH Cytology CSF; Cerebrospinal Fluid  Result Value Ref Range Status   Specimen Description CSF  Final   Special Requests CSF  Final   Gram Stain   Final    NO WBC SEEN NO ORGANISMS SEEN Gram Stain Report Called to,Read Back By  and Verified With: Ileene Patrick RN AT 8182 11/23/21 BY TIBBITTS, K Performed at [facility] [address], [place], [place] [address]    Report Status 11/23/2021  FINAL  Final  CSF culture w Gram Stain     Status: None (Preliminary result)   Collection Time: 11/23/21  3:00 PM   Specimen: PATH Cytology CSF; Cerebrospinal Fluid  Result Value Ref Range Status   Specimen Description   Final    CSF Performed at [facility] [address] [place], [place] [address]    Special Requests   Final    CSF Performed at [facility] [address] [place], [place] [address]    Culture   Final    NO GROWTH 2 DAYS Performed at [facility] [address], [place], [place] [address]    Report Status PENDING  Incomplete  Culture, blood (Routine X 2) w Reflex to ID Panel     Status: None (Preliminary result)   Collection Time: 11/24/21  7:59 AM   Specimen: BLOOD  Result Value Ref Range Status   Specimen Description   Final    BLOOD BLOOD LEFT FOREARM Performed at [facility] [address], [place], [place] [address]    Special Requests   Final    IN PEDIATRIC BOTTLE Blood Culture results may not be optimal due to an inadequate volume of blood received in culture bottles Performed at [facility] [address], [place], [place] [address]    Culture   Final    NO GROWTH < 24 HOURS Performed at [facility] [address], [place], [place] [address]    Report Status PENDING  Incomplete  Culture, blood (Routine X 2) w Reflex to ID Panel     Status: None (Preliminary result)   Collection Time: 11/24/21  7:59 AM   Specimen: BLOOD  Result Value Ref Range Status   Specimen Description   Final    BLOOD LEFT ANTECUBITAL Performed at [facility] [address] [place], [place] [address]    Special Requests   Final    BOTTLES DRAWN AEROBIC ONLY Blood Culture results may not be optimal due to an inadequate volume of blood received in culture bottles Performed at [facility] [address], [place], [place] [address]     Culture   Final    NO GROWTH < 24 HOURS Performed at [facility] [address] [place], [place] [address]    Report Status PENDING  Incomplete    Thayer Headings, [facility] [place] [email or web address] 11/25/2021, 2:54 PM

## 2021-11-25 NOTE — Assessment & Plan Note (Signed)
Resolved

## 2021-11-25 NOTE — Plan of Care (Signed)
Discussed with patient plan of care for the evening, pain management and NPO status after midnight with some teach back displayed.  Problem: Education: Goal: Knowledge of General Education information will improve Description: Including pain rating scale, medication(s)/side effects and non-pharmacologic comfort measures Outcome: Progressing   Problem: Health Behavior/Discharge Planning: Goal: Ability to manage health-related needs will improve Outcome: Progressing

## 2021-11-25 NOTE — TOC Initial Note (Signed)
Transition of Care [facility]) - Initial/Assessment Note    Patient Details  Name: Denise Becker MRN: [id number] Date of Birth: April 16, 1950  Transition of Care [facility]) CM/SW Contact:    Roseanne Kaufman, RN Phone Number: 11/25/2021, 3:46 PM  Clinical Narrative:     No current TOC consult on file Pharmacy: mail order [facility] or [facility] [address]. Prior to admission: DME: walker, cane and no home health services in place Transport at discharge: family  TOC will continue to follow.               Expected Discharge Plan: Home/Self Care Barriers to Discharge: Continued Medical Work up   Patient Goals and CMS Choice        Expected Discharge Plan and Services Expected Discharge Plan: Home/Self Care In-house Referral: NA Discharge Planning Services: CM Consult   Living arrangements for the past 2 months: Single Family Home                 DME Arranged: N/A DME Agency: NA       HH Arranged: NA HH Agency: NA        Prior Living Arrangements/Services Living arrangements for the past 2 months: [place] Lives with:: Relatives Patient language and need for interpreter reviewed:: Yes Do you feel safe going back to the place where you live?: Yes      Need for Family Participation in Patient Care: Yes (Comment) Care giver support system in place?: Yes (comment) Current home services: DME (cane and walker) Criminal Activity/Legal Involvement Pertinent to Current Situation/Hospitalization: No - Comment as needed  Activities of Daily Living Home Assistive Devices/Equipment: Cane (specify quad or straight), Walker (specify type) (Brought front wheel walker from home) ADL Screening (condition at time of admission) Patient's cognitive ability adequate to safely complete daily activities?: Yes Is the patient deaf or have difficulty hearing?: No Does the patient have difficulty seeing, even when wearing glasses/contacts?: Yes Does the patient have  difficulty concentrating, remembering, or making decisions?: No Patient able to express need for assistance with ADLs?: Yes Does the patient have difficulty dressing or bathing?: No Independently performs ADLs?: No Does the patient have difficulty walking or climbing stairs?: Yes Weakness of Legs: Both Weakness of Arms/Hands: Both  Permission Sought/Granted Permission sought to share information with : Case Manager Permission granted to share information with : Yes, Verbal Permission Granted  Share Information with NAME: Case manager           Emotional Assessment Appearance:: Appears stated age Attitude/Demeanor/Rapport: Gracious Affect (typically observed): Accepting   Alcohol / Substance Use: Other (comment) (quit smoking cigarettes 20 years ago) Psych Involvement: No (comment)  Admission diagnosis:  Acute cystitis without hematuria [N30.00] Multiple myeloma not having achieved remission ([place]) [C90.00] AKI (acute kidney injury) ([place]) [N17.9] Acute kidney injury ([place]) [N17.9] Bacteremia [R78.81] Patient Active Problem List   Diagnosis Date Noted   Bacteremia 11/23/2021   Acute kidney injury ([place]) 11/22/2021   Hypotension 11/22/2021   Mild protein malnutrition ([place]) 11/22/2021   Morbid obesity ([place]) 11/22/2021   Possible cervical radiculopathy 11/22/2021   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   MGUS (monoclonal gammopathy of unknown significance) 03/21/2021   Venous stasis 12/22/2013   Bilateral lower leg cellulitis 11/11/2013   Abdominal wall hernia 08/23/2013   Edema of both legs 07/12/2013   Essential hypertension, benign 03/16/2013   Adjustment disorder with mixed anxiety and depressed mood 03/16/2013   Malignant neoplasm of upper-outer quadrant of right breast in female,  estrogen receptor positive ([place]) 03/10/2013   PCP:  Leeroy Cha, MD Pharmacy:   [facility] [place], [place] - [address] AT [place] [address] Phone: [phone] Fax: [phone]  [facility] [place], [place] [address] Phone: [phone] Fax: [phone]  [facility] - [place], [place] [address] Phone: [phone] Fax: [place], [address] AT [facility] OF [address] [place] [address] Phone: [phone] Fax: [phone]     Social Determinants of Health (SDOH) Interventions    Readmission Risk Interventions     No data to display

## 2021-11-25 NOTE — Progress Notes (Signed)
   [facility] has been requested to perform a transesophageal echocardiogram on Denise Becker for bacteremia.  After careful review of history and examination, the risks and benefits of transesophageal echocardiogram have been explained including risks of esophageal damage, perforation (1:10,000 risk), bleeding, pharyngeal hematoma as well as other potential complications associated with anesthesia including aspiration, arrhythmia, respiratory failure and death. Alternatives to treatment were discussed, questions were answered. Patient is willing to proceed. This is scheduled for tomorrow at noon with Dr. Phineas Inches.  Charlie Pitter, PA-C 11/25/2021 10:40 AM

## 2021-11-25 NOTE — Progress Notes (Signed)
  Progress Note   Patient: Denise Becker WUX:[id number] DOB: 1951/03/11 DOA: 11/21/2021     2 DOS: the patient was seen and examined on 11/25/2021 at 10:46 AM      Brief hospital course: Denise Becker is a 71 y.o. F with Br CA s/p neoadjuvant TAC, mastectomy in 2015, adjuvant radiation on tamoxifen, MGUS, anxiety and HTN who presented with neck pain to her PCP and was found to have leukocytosis and sent to the ER where she was found to have AKI and admitted.    8/25: Admitted on antibiotics, fluids, overnight Bcx growing GBS 8/26: Underwent LP, acellular, doubt meningitis 8/27: Blood cultures repeated 8/28: ID consulted     Assessment and Plan: * Bacteremia due to group B Streptococcus Admitted for leukocytosis.  UA with bacteria.  CT chest abdomen pelvis rules out pneumonia, shows no other intraabdominal pathology.  MRI neck shows no abscess.  GBS bacteremia found.  LP ruled out meningitis. - Continue cefazolin - Consult ID - Obtain TEE - Follow repeat blood cultures yesterday    Acute kidney injury ([place]) Cr 1.99 on admission, resolved to baseline 1.0 mg/dL with fluids  Possible cervical radiculopathy MRI cervical spine shows left disc herniation at C3-4, consistent with a left trapezius pain as she has a. - PT - Oxycodone - Hold steroids and NSAIDs given infection and kidney injury  Morbid obesity ([place]) BMI 38 in setting of hypertension  Hypotension Resolved  Essential hypertension, benign Blood pressure elevated - Continue hydralazine - Resume home ARB, amlodipine  Malignant neoplasm of upper-outer quadrant of right breast in female, estrogen receptor positive (Denise Becker) - Continue tamoxifen          Subjective: Patient has moderate left neck pain, no fever, no chest pain.  She does have some cough and wheezing.     Physical Exam: Vitals:   11/24/21 2248 11/25/21 0452 11/25/21 1336 11/25/21 1525  BP: (!) 176/87 (!) 160/82 (!) 144/76   Pulse: 83  81 89   Resp: (!) '21 18 20   '$ Temp: 98.5 F (36.9 C) 98.2 F (36.8 C) 98.8 F (37.1 C)   TempSrc: Oral Oral Oral   SpO2: 95% 100% 98% 98%  Weight:      Height:       Adult female, sitting up in recliner, stiff neck RRR, no murmurs, no peripheral edema Respiratory rate normal, lungs clear without rales or wheezes Abdomen soft without tenderness palpation or guarding, no ascites or distention Attention normal, affect normal, judgment and insight appear normal  Data Reviewed: Discussed with infectious disease Basic metabolic panel normal Hemogram shows white blood cell count down to 13, otherwise normal  Family Communication: Friend at the bedside    Disposition: Status is: Inpatient         Author: Edwin Dada, MD 11/25/2021 7:20 PM  For on call review [email or web address]

## 2021-11-26 ENCOUNTER — Inpatient Hospital Stay (HOSPITAL_COMMUNITY): Payer: Medicare HMO | Admitting: Anesthesiology

## 2021-11-26 ENCOUNTER — Telehealth (HOSPITAL_COMMUNITY): Payer: Self-pay | Admitting: Pharmacy Technician

## 2021-11-26 ENCOUNTER — Inpatient Hospital Stay (HOSPITAL_COMMUNITY): Payer: Medicare HMO

## 2021-11-26 ENCOUNTER — Other Ambulatory Visit (HOSPITAL_COMMUNITY): Payer: Self-pay

## 2021-11-26 ENCOUNTER — Encounter (HOSPITAL_COMMUNITY): Payer: Self-pay | Admitting: Family Medicine

## 2021-11-26 ENCOUNTER — Encounter (HOSPITAL_COMMUNITY)
Admission: EM | Disposition: A | Discharge status: Home / Self Care | Payer: Self-pay | Source: Home / Self Care | Attending: Family Medicine

## 2021-11-26 DIAGNOSIS — Z87891 Personal history of nicotine dependence: Secondary | ICD-10-CM

## 2021-11-26 DIAGNOSIS — R7881 Bacteremia: Secondary | ICD-10-CM | POA: Diagnosis not present

## 2021-11-26 DIAGNOSIS — I4891 Unspecified atrial fibrillation: Secondary | ICD-10-CM | POA: Diagnosis not present

## 2021-11-26 DIAGNOSIS — F418 Other specified anxiety disorders: Secondary | ICD-10-CM | POA: Diagnosis not present

## 2021-11-26 DIAGNOSIS — I10 Essential (primary) hypertension: Secondary | ICD-10-CM

## 2021-11-26 DIAGNOSIS — I480 Paroxysmal atrial fibrillation: Secondary | ICD-10-CM

## 2021-11-26 DIAGNOSIS — B951 Streptococcus, group B, as the cause of diseases classified elsewhere: Secondary | ICD-10-CM | POA: Diagnosis not present

## 2021-11-26 HISTORY — PX: CARDIOVERSION: SHX1299

## 2021-11-26 HISTORY — PX: TEE WITHOUT CARDIOVERSION: SHX5443

## 2021-11-26 LAB — CBC
HCT: 40.1 % (ref 36.0–46.0)
Hemoglobin: 13.1 g/dL (ref 12.0–15.0)
MCH: 27.6 pg (ref 26.0–34.0)
MCHC: 32.7 g/dL (ref 30.0–36.0)
MCV: 84.6 fL (ref 80.0–100.0)
Platelets: 248 10*3/uL (ref 150–400)
RBC: 4.74 MIL/uL (ref 3.87–5.11)
RDW: 15.9 % — ABNORMAL HIGH (ref 11.5–15.5)
WBC: 11.8 10*3/uL — ABNORMAL HIGH (ref 4.0–10.5)
nRBC: 0 % (ref 0.0–0.2)

## 2021-11-26 LAB — PROTIME-INR
INR: 1.1 (ref 0.8–1.2)
Prothrombin Time: 13.9 seconds (ref 11.4–15.2)

## 2021-11-26 SURGERY — ECHOCARDIOGRAM, TRANSESOPHAGEAL
Anesthesia: Monitor Anesthesia Care

## 2021-11-26 MED ORDER — METOPROLOL TARTRATE 25 MG PO TABS
25.0000 mg | ORAL_TABLET | Freq: Two times a day (BID) | ORAL | Status: DC
  Administered 2021-11-26 – 2021-11-27 (×2): 25 mg via ORAL
  Filled 2021-11-26 (×2): qty 1

## 2021-11-26 MED ORDER — APIXABAN 5 MG PO TABS
5.0000 mg | ORAL_TABLET | Freq: Two times a day (BID) | ORAL | Status: DC
  Filled 2021-11-26 (×2): qty 1

## 2021-11-26 MED ORDER — HEPARIN (PORCINE) 25000 UT/250ML-% IV SOLN
1000.0000 [IU]/h | INTRAVENOUS | Status: DC
  Filled 2021-11-26: qty 250

## 2021-11-26 MED ORDER — SODIUM CHLORIDE 0.9 % IV SOLN: INTRAVENOUS | Status: DC

## 2021-11-26 MED ORDER — CEFADROXIL 500 MG PO CAPS
1000.0000 mg | ORAL_CAPSULE | Freq: Two times a day (BID) | ORAL | Status: DC
Start: 2021-11-26 — End: 2021-11-27
  Administered 2021-11-26 – 2021-11-27 (×3): 1000 mg via ORAL
  Filled 2021-11-26 (×4): qty 2

## 2021-11-26 MED ORDER — PHENYLEPHRINE 80 MCG/ML (10ML) SYRINGE FOR IV PUSH (FOR BLOOD PRESSURE SUPPORT)
PREFILLED_SYRINGE | INTRAVENOUS | Status: DC | PRN
  Administered 2021-11-26: 80 ug via INTRAVENOUS

## 2021-11-26 MED ORDER — APIXABAN 5 MG PO TABS
5.0000 mg | ORAL_TABLET | Freq: Two times a day (BID) | ORAL | Status: DC
  Administered 2021-11-26 – 2021-11-27 (×3): 5 mg via ORAL
  Filled 2021-11-26 (×5): qty 1

## 2021-11-26 MED ORDER — PROPOFOL 500 MG/50ML IV EMUL
INTRAVENOUS | Status: DC | PRN
  Administered 2021-11-26: 125 ug/kg/min via INTRAVENOUS

## 2021-11-26 MED ORDER — PHENOL 1.4 % MT LIQD
1.0000 | OROMUCOSAL | Status: DC | PRN
  Administered 2021-11-26: 1 via OROMUCOSAL
  Filled 2021-11-26: qty 177

## 2021-11-26 MED ORDER — PROPOFOL 10 MG/ML IV BOLUS
INTRAVENOUS | Status: DC | PRN
  Administered 2021-11-26: 30 mg via INTRAVENOUS

## 2021-11-26 NOTE — Discharge Instructions (Signed)
Please review all of you discharge paperwork on the day of discharge and be sure you have all of your prescribed medications.  Please request your Primary MD to go over all Hospital Tests and Procedure/Radiological results at the follow up Please get all Hospital records sent to your primary MD by signing hospital release before you go home.   In some cases, there will be blood work, cultures and biopsy results pending at the time of your discharge. Please request that your primary care M.D. goes through all the records of your hospital data and follows up on these results.  Please take all your medications with you for your next visit with your Primary MD   Please request your Primary MD to go over all hospital tests and procedure/radiological results at the follow up, please ask your Primary MD to get all Hospital records sent to his/her office.   You must read complete instructions/literature along with all the possible adverse reactions/side effects for all the Medicines you take and that have been prescribed to you. Take any new Medicines after you have completely understood and accpet all the possible adverse reactions/side effects.    Do not drive or operate heavy machinery when taking Pain medications.    Do not take more than prescribed Pain, Sleep and Anxiety Medications  If you have smoked or chewed Tobacco  in the last 2 yrs please stop smoking, stop any regular Alcohol  and or any Recreational drug use.   Wear Seat belts while driving.   If you had Pneumonia or Lung problems at the Hospital: Please get a 2 view Chest X ray done in 6-8 weeks after hospital discharge or sooner if instructed by your Primary MD.   If you have Congestive Heart Failure: Please call your Cardiologist or Primary MD anytime you have any of the following symptoms:  1) 3 pound weight gain in 24 hours or 5 pounds in 1 week  2) shortness of breath, with or without a dry hacking cough  3) swelling in the  hands, feet or stomach  4) if you have to sleep on extra pillows at night in order to breathe 5) Follow cardiac low salt diet and 1.5 lit/day fluid restriction.   If you have Diabetes Accuchecks 4 times/day- once on AM empty stomach and then before each meal. Log in all results and show them to your primary doctor at your next visit. If any glucose reading is under 60 or above 400 call your primary MD immediately.   If you have Seizure/Convulsions/Epilepsy: Please do not drive, operate heavy machinery, participate in activities at heights or participate in high speed sports until you have seen by Primary MD or a Neurologist and advised to do so again. Per [facility] statutes, patients with seizures are not allowed to drive until they have been seizure-free for six months.  Use caution when using heavy equipment or power tools. Avoid working on ladders or at heights. Take showers instead of baths. Ensure the water temperature is not too high on the home water heater. Do not go swimming alone. Do not lock yourself in a room alone (i.e. bathroom). When caring for infants or small children, sit down when holding, feeding, or changing them to minimize risk of injury to the child in the event you have a seizure. Maintain good sleep hygiene. Avoid alcohol.    If you had Gastrointestinal Bleeding: Please ask your Primary MD to check a complete blood count within one week  of discharge or at your next visit. Your endoscopic/colonoscopic biopsies that are pending at the time of discharge, will also need to followed by your Primary MD.  Please note You were cared for by a hospitalist during your hospital stay. If you have any questions about your discharge medications or the care you received while you were in the hospital after you are discharged, you can call the unit and asked to speak with the hospitalist on call if the hospitalist that took care of you is not available. Once you are discharged,  your primary care physician will handle any further medical issues. Please note that NO REFILLS for any discharge medications will be authorized once you are discharged, as it is imperative that you return to your primary care physician (or establish a relationship with a primary care physician if you do not have one) for your aftercare needs so that they can reassess your need for medications and monitor your lab values.   You can reach the hospitalist office at phone [phone] or fax [phone]   If you do not have a primary care physician, you can call [phone] for a physician referral.     Information on my medicine - XARELTO (Rivaroxaban)  This medication education was reviewed with me or my healthcare representative as part of my discharge preparation.  The pharmacist that spoke with me during my hospital stay was: Gretta Arab PharmD.   Why was Xarelto prescribed for you? Xarelto was prescribed for you to reduce the risk of a blood clot forming that can cause a stroke if you have a medical condition called atrial fibrillation (a type of irregular heartbeat).  What do you need to know about xarelto ? Take your Xarelto ONCE DAILY at the same time every day with your evening meal. If you have difficulty swallowing the tablet whole, you may crush it and mix in applesauce just prior to taking your dose.  Take Xarelto exactly as prescribed by your doctor and DO NOT stop taking Xarelto without talking to the doctor who prescribed the medication.  Stopping without other stroke prevention medication to take the place of Xarelto may increase your risk of developing a clot that causes a stroke.  Refill your prescription before you run out.  After discharge, you should have regular check-up appointments with your healthcare provider that is prescribing your Xarelto.  In the future your dose may need to be changed if your kidney function or weight changes by a significant  amount.  What do you do if you miss a dose? If you are taking Xarelto ONCE DAILY and you miss a dose, take it as soon as you remember on the same day then continue your regularly scheduled once daily regimen the next day. Do not take two doses of Xarelto at the same time or on the same day.   Important Safety Information A possible side effect of Xarelto is bleeding. You should call your healthcare provider right away if you experience any of the following: Bleeding from an injury or your nose that does not stop. Unusual colored urine (red or dark brown) or unusual colored stools (red or black). Unusual bruising for unknown reasons. A serious fall or if you hit your head (even if there is no bleeding).  Some medicines may interact with Xarelto and might increase your risk of bleeding while on Xarelto. To help avoid this, consult your healthcare provider or pharmacist prior to using any new prescription or non-prescription medications, including herbals,  vitamins, non-steroidal anti-inflammatory drugs (NSAIDs) and supplements.  This website has more information on Xarelto: [email or web address]

## 2021-11-26 NOTE — Assessment & Plan Note (Signed)
Incidental and unexpected finding 8/29.  On arrival for TEE, had new tachycardia.  ECG showed Atrial fibrillation.  CHA2DS2-Vasc 3 (Age, gender, HTN).  Underwent TEE-DCCV and remains in sinus now. - Start Eliquis - Start metoprolol - Follow up with Dr. Terri Skains at discharge for new onset Afib

## 2021-11-26 NOTE — Anesthesia Procedure Notes (Signed)
Procedure Name: MAC Date/Time: 11/26/2021 11:00 AM  Performed by: Dorann Lodge, CRNAPre-anesthesia Checklist: Patient identified, Emergency Drugs available, Suction available and Patient being monitored Patient Re-evaluated:Patient Re-evaluated prior to induction Oxygen Delivery Method: Nasal cannula Airway Equipment and Method: Bite block Dental Injury: Teeth and Oropharynx as per pre-operative assessment

## 2021-11-26 NOTE — Progress Notes (Signed)
Progress Note   Patient: Denise Becker ALP:[id number] DOB: February 16, 1951 DOA: 11/21/2021     3 DOS: the patient was seen and examined on 11/26/2021 at 15:35      Brief hospital course: Mrs. Lesmeister is a 71 y.o. F with Br CA s/p neoadjuvant TAC, mastectomy in 2015, adjuvant radiation on tamoxifen, MGUS, anxiety and HTN who presented with neck pain to her PCP and was found to have leukocytosis and sent to the ER where she was found to have AKI and admitted.    8/25: Admitted on antibiotics, fluids, overnight Bcx growing GBS 8/26: Underwent LP, acellular, doubt meningitis 8/27: Blood cultures repeated 8/28: ID consulted 8/29: Underwent TEE, found to have new onset Afib     Assessment and Plan: * Bacteremia due to group B Streptococcus Admitted for leukocytosis.  UA with bacteria.  CT chest abdomen pelvis ruled out pneumonia, intraabdominal pathology.  MRI neck ruled out ESA.  LP ruled out meningitis.  TEE today ruled out endocarditis.  Isolated GBS bacteremia, no source.  Repeat cultures negative.   - Continue cefazolin IV - Plan for d/c on cefadroxil x7 days and PCP follow up - Consult ID    Paroxysmal atrial fibrillation ([place]) Incidental and unexpected finding 8/29.  On arrival for TEE, had new tachycardia.  ECG showed Atrial fibrillation.  CHA2DS2-Vasc 3 (Age, gender, HTN).  Underwent TEE-DCCV and remains in sinus now. - Start Eliquis - Start metoprolol - Follow up with Dr. Terri Skains at discharge for new onset Afib  Cervical radiculopathy MRI cervical spine shows left disc herniation at C3-4, consistent with the left trapezius pain she has. - Hold NSAIDs given AKI - With infection under control, could consider steroid burst at discharge - Outpatient PT referral  Morbid obesity ([place]) BMI 38 in setting of hypertension  Hypotension Resolved  Acute kidney injury ([place]) Cr 1.99 on admission, resolved to baseline 1.0 mg/dL with fluids  Essential hypertension,  benign Blood pressure slightly elevated - Continue home hydralazine, ARB, amlodipine - Start new metoprolol  Malignant neoplasm of upper-outer quadrant of right breast in female, estrogen receptor positive ([place]) - Continue tamoxifen          Subjective: patient overwhelmed by new Afib, severe pain in neck.  No fever, no vomiting, no cough.  No more wheezing today.       Physical Exam: BP (!) 145/96 (BP Location: Right Arm)   Pulse 97   Temp 99.6 F (37.6 C) (Oral)   Resp (!) 22   Ht '5\' 1"'$  (1.549 m)   Wt 92.4 kg   SpO2 98%   BMI 38.49 kg/m   Obese adult female, sitting up in bed, immobile, unable to move her neck, mostly keeps her eyes closed RRR, no murmurs, no peripheral edema Respiratory rate normal, lungs clear without rales or wheezes Abdomen soft no tenderness palpation or guarding, no ascites or distention The patient is unable to move her neck due to pain, speech is fluent, extraocular movements intact, she is unable to move her arms or legs with me due to pain, she is oriented to person, place, time, and situation    Data Reviewed: Discussed with cardiology and ID Hemogram shows white blood cell count down to 11.8 CSF culture negative Blood culture from 827, no growth to date Transesophageal echocardiogram report reviewed EKG personally reviewed, shows no atrial fibrillation, no ST changes       Disposition: Status is: Inpatient The patient was admitted for hypotension and leukocytosis and found to  have group B strep bacteremia  Extensive work-up found no source, infectious disease were consulted, recommend oral antibiotics  She was incidentally found to have new onset atrial fibrillation started on Eliquis and metoprolol  At present the patient is in severe pain from her cervical radiculopathy, and I believe it is unsafe to discharge tonight because of the risk of readmission, or inability care for self at home.  Patient is arranging for  support, Likely discharge home tomorrow        Author: Edwin Dada, MD 11/26/2021 6:56 PM  For on call review [email or web address].

## 2021-11-26 NOTE — Anesthesia Preprocedure Evaluation (Addendum)
Anesthesia Evaluation  Patient identified by MRN, date of birth, ID band Patient awake    Reviewed: Allergy & Precautions, H&P , NPO status , Patient's Chart, lab work & pertinent test results  Airway Mallampati: II  TM Distance: >3 FB Neck ROM: Full    Dental  (+) Teeth Intact, Poor Dentition, Loose, Missing,    Pulmonary former smoker,    breath sounds clear to auscultation       Cardiovascular hypertension, Pt. on medications  Rhythm:Regular Rate:Normal     Neuro/Psych PSYCHIATRIC DISORDERS Anxiety Depression  Neuromuscular disease    GI/Hepatic hiatal hernia, GERD  Medicated,  Endo/Other  Morbid obesity  Renal/GU      Musculoskeletal   Abdominal   Peds  Hematology   Anesthesia Other Findings   Reproductive/Obstetrics                            Anesthesia Physical  Anesthesia Plan  ASA: 3  Anesthesia Plan: MAC   Post-op Pain Management: Minimal or no pain anticipated   Induction: Intravenous  PONV Risk Score and Plan: Propofol infusion  Airway Management Planned: Simple Face Mask and Natural Airway  Additional Equipment: None  Intra-op Plan:   Post-operative Plan:   Informed Consent: I have reviewed the patients History and Physical, chart, labs and discussed the procedure including the risks, benefits and alternatives for the proposed anesthesia with the patient or authorized representative who has indicated his/her understanding and acceptance.     Dental advisory given  Plan Discussed with: CRNA, Anesthesiologist and Surgeon  Anesthesia Plan Comments:         Anesthesia Quick Evaluation

## 2021-11-26 NOTE — CV Procedure (Signed)
  INDICATIONS: Bacteremia  PROCEDURE:   Informed consent was obtained prior to the procedure. The risks, benefits and alternatives for the procedure were discussed and the patient comprehended these risks.  Risks include, but are not limited to, cough, sore throat, vomiting, nausea, somnolence, esophageal and stomach trauma or perforation, bleeding, low blood pressure, aspiration, pneumonia, infection, trauma to the teeth and death.    After a procedural time-out, the oropharynx was anesthetized with 20% benzocaine spray.   During this procedure the patient was administered propofol (233 mg) to achieve and maintain moderate conscious sedation.  The patient's heart rate, blood pressure, and oxygen saturationweare monitored continuously during the procedure. The period of conscious sedation was 20  minutes, of which I was present face-to-face 100% of this time.  The transesophageal probe was inserted in the esophagus and stomach without difficulty and multiple views were obtained.  The patient was kept under observation until the patient left the procedure room.  The patient left the procedure room in stable condition.   Agitated microbubble saline contrast was not administered.  COMPLICATIONS:    There were no immediate complications.  FINDINGS:  Normal LV function No significant valve dx No LAA thrombus Negative study for vegetations   RECOMMENDATIONS:    Start eliquis 5 mg BID Start metoprolol succinate XL 25 mg daily  Send referral to cardiology , Dr. Phineas Inches for outpatient follow up  Time Spent Directly with the Patient:  20 minutes   Janina Mayo 11/26/2021, 11:33 AM    Procedure: Electrical Cardioversion Indications:  Atrial Fibrillation  Procedure Details:  Consent: Risks of procedure as well as the alternatives and risks of each were explained to the (patient/caregiver).  Consent for procedure obtained.  Time Out: Verified patient identification, verified  procedure, site/side was marked, verified correct patient position, special equipment/implants available, medications/allergies/relevent history reviewed, required imaging and test results available. PERFORMED.  Patient placed on cardiac monitor, pulse oximetry, supplemental oxygen as necessary.  Sedation given:  233 mg propofol Pacer pads placed anterior and posterior chest.  Cardioverted 2 time(s).  Cardioversion with synchronized biphasic 200J shock.  Evaluation: Findings: Post procedure EKG shows:  Sinus tachycardia, PVCs Complications: None Patient did tolerate procedure well.  Time Spent Directly with the Patient:  20 minutes   Janina Mayo 11/26/2021, 11:33 AM

## 2021-11-26 NOTE — TOC Benefit Eligibility Note (Signed)
Patient Advocate Encounter  Insurance verification completed.    The patient is currently admitted and upon discharge could be taking Eliquis 5 mg.  The current 30 day co-pay is $45.00.   The patient is insured through [employer] Medicare Part D     Edvin Albus, CPhT Pharmacy Patient Advocate Specialist  Pharmacy Patient Advocate Team Direct Number: [phone]  Fax: [phone]        

## 2021-11-26 NOTE — Interval H&P Note (Signed)
History and Physical Interval Note:  11/26/2021 10:34 AM  Denise Becker  has presented today for surgery, with the diagnosis of BACTOREMIA.  The various methods of treatment have been discussed with the patient and family. After consideration of risks, benefits and other options for treatment, the patient has consented to  Procedure(s): TRANSESOPHAGEAL ECHOCARDIOGRAM (TEE) (N/A) as a surgical intervention.  The patient's history has been reviewed, patient examined, no change in status, stable for surgery.  I have reviewed the patient's chart and labs.  Questions were answered to the patient's satisfaction.     Janina Mayo

## 2021-11-26 NOTE — Transfer of Care (Signed)
Immediate Anesthesia Transfer of Care Note  Patient: Denise Becker  Procedure(s) Performed: TRANSESOPHAGEAL ECHOCARDIOGRAM (TEE) CARDIOVERSION  Patient Location: Endoscopy Unit  Anesthesia Type:MAC  Level of Consciousness: awake and drowsy  Airway & Oxygen Therapy: Patient Spontanous Breathing  Post-op Assessment: Report given to RN and Post -op Vital signs reviewed and stable  Post vital signs: Reviewed and stable  Last Vitals:  Vitals Value Taken Time  BP    Temp    Pulse    Resp    SpO2      Last Pain:  Vitals:   11/26/21 1016  TempSrc: Oral  PainSc: 6       Patients Stated Pain Goal: 2 (53/96/72 8979)  Complications: No notable events documented.

## 2021-11-26 NOTE — Evaluation (Signed)
Physical Therapy Evaluation Patient Details Name: Denise Becker MRN: [id number] DOB: [birth year] Today's Date: 11/26/2021  History of Present Illness  71 y.o. F with Br CA s/p neoadjuvant TAC, mastectomy in 2015, adjuvant radiation on tamoxifen, MGUS, anxiety and HTN who presented with neck pain to her PCP and was found to have leukocytosis.  Pt admitted 11/21/21 for AKI and Bacteremia due to group B Streptococcus  Clinical Impression  Patient evaluated by Physical Therapy with no further acute PT needs identified. All education has been completed and the patient has no further questions.  PT consulted for left neck pain.  Pt resting in left lateral neck flexion and rotation on arrival to room.  Pt with difficulty even achieving midline neck position due to pain.  Encouraged pt to gentle AROM within pain tolerance at home.  Pt requesting massage for left neck area and present with tight upper left trapezius so provided with gentle massage to area.  Pt able to briefly maintain midline neck position after massage and thankful as well.  Pt educated on benefits of OPPT for symptom management and treatment of Left foraminal disc protrusion at C3-4 with moderate left neural  foraminal stenosis (according to cervical MRI).  Pt reports she has been OOB and using RW today and declined mobility however did sit upright in bed for neck/cervical assessment.  Pt reports she has RW at home and also has transportation to [facility].  Pt states she will call PCP for OPPT recommendation and referral.  PT is signing off. Thank you for this referral.        Recommendations for follow up therapy are one component of a multi-disciplinary discharge planning process, led by the attending physician.  Recommendations may be updated based on patient status, additional functional criteria and insurance authorization.  Follow Up Recommendations Outpatient PT      Assistance Recommended at Discharge PRN  Patient can return home  with the following       Equipment Recommendations None recommended by PT  Recommendations for Other Services       Functional Status Assessment Patient has had a recent decline in their functional status and demonstrates the ability to make significant improvements in function in a reasonable and predictable amount of time.     Precautions / Restrictions        Mobility  Bed Mobility                    Transfers                        Ambulation/Gait                  Stairs            Wheelchair Mobility    Modified Rankin (Stroke Patients Only)       Balance                                             Pertinent Vitals/Pain Pain Assessment Pain Assessment: Faces Faces Pain Scale: Hurts whole lot Pain Location: left neck and upper trapezius Pain Descriptors / Indicators: Sore, Shooting, Tightness Pain Intervention(s): Monitored during session    Home Living Family/patient expects to be discharged to:: Private residence  Additional Comments: pt reports she has assist and RW at home.  Pt also states she has transportation to [facility].    Prior Function Prior Level of Function : Independent/Modified Independent                     Hand Dominance        Extremity/Trunk Assessment             Cervical / Trunk Assessment Cervical / Trunk Assessment: Other exceptions Cervical / Trunk Exceptions: Cervical Spine MRI: Left foraminal disc protrusion at C3-4 with moderate left neural  foraminal stenosis.  Pt with neck resting in left lateral flexion and rotation upon entering room.  Pt unable to achieve midline and only able to hold close to midline position briefly due to pain.  Pt encouraged to try gentle AROM of neck within pain tolerance approx 30 minutes after taking her pain and muscle relaxer medication once home.  Pt cautioned not to go into pain.  Also encouraged midline  position as able in front of mirror.  Pt provided with gentle upper right trapezius massage for approx 8 minutes and then able to achieve midline neck position however still uncomfortable to maintain and returns to left lateral flexion and rotation.  Discussed using pillow for supporting neck position especially with sleep.  Communication   Communication: No difficulties  Cognition                                                General Comments      Exercises     Assessment/Plan    PT Assessment All further PT needs can be met in the next venue of care  PT Problem List Decreased strength;Decreased range of motion       PT Treatment Interventions      PT Goals (Current goals can be found in the Care Plan section)  Acute Rehab PT Goals PT Goal Formulation: All assessment and education complete, DC therapy    Frequency       Co-evaluation               AM-PAC PT "6 Clicks" Mobility  Outcome Measure Help needed turning from your back to your side while in a flat bed without using bedrails?: None Help needed moving from lying on your back to sitting on the side of a flat bed without using bedrails?: A Little Help needed moving to and from a bed to a chair (including a wheelchair)?: A Little Help needed standing up from a chair using your arms (e.g., wheelchair or bedside chair)?: A Little Help needed to walk in hospital room?: A Little Help needed climbing 3-5 steps with a railing? : A Little 6 Click Score: 19    End of Session   Activity Tolerance: Patient tolerated treatment well Patient left: in bed;with call bell/phone within reach   PT Visit Diagnosis: Other symptoms and signs involving the nervous system (R29.898)    Time: 6283-6629 PT Time Calculation (min) (ACUTE ONLY): 22 min   Charges:   PT Evaluation $PT Eval Low Complexity: 1 Low         Kati PT, DPT Physical Therapist Acute Rehabilitation Services Preferred contact  method: Secure Chat Weekend Pager Only: [phone] Office: [place] 11/26/2021, 4:08 PM

## 2021-11-26 NOTE — Progress Notes (Signed)
    [facility]   Reason for visit: Follow up on GBS bacteremia  Interval History: TEE negative for vegetation  Physical Exam: Constitutional:  Vitals:   11/26/21 1150 11/26/21 1322  BP: 139/69 (!) 145/96  Pulse: 97 97  Resp: 20 (!) 22  Temp:  99.6 F (37.6 C)  SpO2: 94% 98%   patient appears in NAD  Impression: bacteremia.  Negative work up.  Repeat blood cultures negative  Plan: 1.  I recommend cefadroxil 1000 mg (two 500 mg tabs) twice a day for 7 more days.

## 2021-11-26 NOTE — Progress Notes (Signed)
PT Cancellation Note  Patient Details Name: Denise Becker MRN: [id number] DOB: [birth year]   Cancelled Treatment:    Reason Eval/Treat Not Completed: Patient at procedure or test/unavailable   Kati L Payson 11/26/2021, 10:07 AM Arlyce Dice, DPT Physical Therapist Acute Rehabilitation Services Preferred contact method: Secure Chat Weekend Pager Only: [phone] Office: [phone]

## 2021-11-26 NOTE — Telephone Encounter (Signed)
Pharmacy Patient Advocate Encounter  Insurance verification completed.    The patient is insured through [employer] Medicare Part D   The patient is currently admitted and ran test claims for the following: Eliquis .  Copays and coinsurance results were relayed to Inpatient clinical team.      

## 2021-11-27 DIAGNOSIS — C50411 Malignant neoplasm of upper-outer quadrant of right female breast: Secondary | ICD-10-CM | POA: Diagnosis not present

## 2021-11-27 DIAGNOSIS — N179 Acute kidney failure, unspecified: Secondary | ICD-10-CM | POA: Diagnosis not present

## 2021-11-27 DIAGNOSIS — R7881 Bacteremia: Secondary | ICD-10-CM | POA: Diagnosis not present

## 2021-11-27 DIAGNOSIS — I10 Essential (primary) hypertension: Secondary | ICD-10-CM | POA: Diagnosis not present

## 2021-11-27 DIAGNOSIS — Z170 Estrogen receptor positive status [ER+]: Secondary | ICD-10-CM

## 2021-11-27 DIAGNOSIS — C9000 Multiple myeloma not having achieved remission: Secondary | ICD-10-CM

## 2021-11-27 LAB — CSF CULTURE W GRAM STAIN: Culture: NO GROWTH

## 2021-11-27 MED ORDER — METOPROLOL TARTRATE 25 MG PO TABS: 25.0000 mg | ORAL_TABLET | Freq: Two times a day (BID) | ORAL | 1 refills | Status: DC

## 2021-11-27 MED ORDER — APIXABAN 5 MG PO TABS: 5.0000 mg | ORAL_TABLET | Freq: Two times a day (BID) | ORAL | 0 refills | Status: DC

## 2021-11-27 MED ORDER — CEFADROXIL 500 MG PO CAPS: 1000.0000 mg | ORAL_CAPSULE | Freq: Two times a day (BID) | ORAL | 0 refills | Status: AC

## 2021-11-27 MED ORDER — APIXABAN (ELIQUIS) VTE STARTER PACK (10MG AND 5MG): ORAL_TABLET | ORAL | 0 refills | Status: DC

## 2021-11-27 NOTE — Anesthesia Postprocedure Evaluation (Signed)
Anesthesia Post Note  Patient: Denise Becker  Procedure(s) Performed: TRANSESOPHAGEAL ECHOCARDIOGRAM (TEE) CARDIOVERSION     Patient location during evaluation: PACU Anesthesia Type: MAC Level of consciousness: awake and alert Pain management: pain level controlled Vital Signs Assessment: post-procedure vital signs reviewed and stable Respiratory status: spontaneous breathing, nonlabored ventilation, respiratory function stable and patient connected to nasal cannula oxygen Cardiovascular status: stable and blood pressure returned to baseline Postop Assessment: no apparent nausea or vomiting Anesthetic complications: no   No notable events documented.  Last Vitals:  Vitals:   11/26/21 2041 11/27/21 0458  BP: 126/66 (!) 144/59  Pulse: (!) 107 75  Resp: 17 18  Temp: 37.4 C 36.8 C  SpO2: 93% 97%    Last Pain:  Vitals:   11/27/21 0458  TempSrc: Oral  PainSc:                  [name]

## 2021-11-27 NOTE — Plan of Care (Signed)

## 2021-11-27 NOTE — Progress Notes (Signed)
Discharge instructions given. Pt verbalized understanding of all information, including new medications, dosages, times, & need to make upcoming doctor's appointments. Reviewed current pain in neck & need to find a neuro or spine surgeon. Pt verbalized having no other questions at this time. Pt is ready, ride on way, & tech notified ok for pt to be discharged via wheel chair.

## 2021-11-27 NOTE — Discharge Summary (Addendum)
Physician Discharge Summary  Denise Becker VQQ:[id number] DOB: 11/05/1950 DOA: 11/21/2021  PCP: Denise Cha, MD  Admit date: 11/21/2021 Discharge date: 11/27/2021 Discharging to: home Recommendations for Outpatient Follow-up:  Recommend outpatient referral back to her cardiologist as needed for new finding of atrial fibrillation Follow-up BP in office in 1 to 2 weeks  Consults:  Cardiology ID Procedures:  TEE cardioversion   Discharge Diagnoses:   Principal Problem:   Bacteremia due to group B Streptococcus Active Problems:   Paroxysmal atrial fibrillation (HCC)   Malignant neoplasm of upper-outer quadrant of right breast in female, estrogen receptor positive ([place])   Essential hypertension, benign   MGUS (monoclonal gammopathy of unknown significance)   Acute kidney injury ([place])   Morbid obesity [facility])     Hospital Course:  This is a 71 year old female with breast cancer, MGUS, anxiety and hypertension who presented to her PCP with a complaint of neck pain.  In the ED, she was found to have WBC count of 26.4.  Creatinine was elevated at 1.99.  Her baseline is around 0.8-0.9. She was later found to have bacteremia.  On 8/28 an ID consult was requested and a TEE was recommended. On arrival for the TEE, the patient was noted to have tachycardia.  An EKG revealed that she was in atrial fibrillation.  She underwent DC cardioversion during the TEE and converted back to sinus rhythm.  Principal Problem:   Bacteremia due to group B Streptococcus -No etiology found -Repeat blood cultures negative -Leukocytosis has nearly resolved-WBC 11.8 yesterday -Appreciate ID consult - The patient received Ancef in the hospital - ID recommended that the patient take cefadroxil for 7 more days.  Active Problems:   Paroxysmal atrial fibrillation ([place]) -New diagnosis -That is post DC cardioversion on 8/29 - She has been started on Eliquis and metoprolol - The patient's  cardiologist is Dr. Terri Becker (mainly manages her hypertension) and she can follow-up with him as outpatient for further treatment  Essential hypertension - At home, the patient takes amlodipine, hydralazine and losartan -At this point I will hold her hydralazine to prevent hypotension as we are starting metoprolol -BP will need to be followed as outpatient  Neck pain - MRI of the C-spine was performed on 8/25 findings noted below -Pain resolved for now  Malignant neoplasm of upper-outer quadrant of right breast in female, estrogen receptor positive ([place]) -Continue tamoxifen    Acute kidney injury ([place]) -Given IV fluids-BP improved back to baseline    Morbid obesity ([place])  Body mass index is 38.49 kg/m.        Discharge Instructions  Discharge Instructions     Diet - low sodium heart healthy   Complete by: As directed    Increase activity slowly   Complete by: As directed       Allergies as of 11/27/2021       Reactions   Effexor [venlafaxine] Other (See Comments)   Made her nervous   Ciprofloxacin Diarrhea   Patient developed severe diarrhea on cipro   Penicillins Rash   Did it involve swelling of the face/tongue/throat, SOB, or low BP? No Did it involve sudden or severe rash/hives, skin peeling, or any reaction on the inside of your mouth or nose? Yes Did you need to seek medical attention at a hospital or doctor's office? No When did it last happen? Several Years Ago       If all above answers are "NO", may proceed with cephalosporin use.  Medication List     STOP taking these medications    hydrALAZINE 25 MG tablet Commonly known as: APRESOLINE       TAKE these medications    amLODipine 10 MG tablet Commonly known as: NORVASC Take 10 mg by mouth daily.   apixaban 5 MG Tabs tablet Commonly known as: ELIQUIS Take 1 tablet (5 mg total) by mouth 2 (two) times daily.   cefadroxil 500 MG capsule Commonly known as: DURICEF Take 2 capsules  (1,000 mg total) by mouth 2 (two) times daily for 6 days.   losartan 100 MG tablet Commonly known as: COZAAR Take 1 tablet (100 mg total) by mouth daily.   metoprolol tartrate 25 MG tablet Commonly known as: LOPRESSOR Take 1 tablet (25 mg total) by mouth 2 (two) times daily.   Multivitamin Adult Chew Chew 2 tablets by mouth 2 (two) times a week.   omeprazole 20 MG capsule Commonly known as: PRILOSEC Take 1 capsule (20 mg total) by mouth daily before breakfast. 30 mins before food ideally What changed:  when to take this reasons to take this additional instructions   tamoxifen 20 MG tablet Commonly known as: NOLVADEX Take 1 tablet (20 mg total) by mouth daily.   tiZANidine 4 MG tablet Commonly known as: ZANAFLEX Take 4 mg by mouth 3 (three) times daily.        Follow-up Information     Becker, [name], DO Follow up.   Specialties: Cardiology, Vascular Surgery Why: Please make appt for follow up of A-fib Contact information: [place] [phone] [phone]         Denise Cha, MD Follow up in 1 week(s).   Specialty: Internal Medicine Contact information: [address] [place] [address] [phone]                    The results of significant diagnostics from this hospitalization (including imaging, microbiology, ancillary and laboratory) are listed below for reference.    ECHO TEE  Result Date: 11/26/2021    TRANSESOPHOGEAL ECHO REPORT   Patient Name:   Denise Becker Date of Exam: 11/26/2021 Medical Rec #:  [id number]          Height:       61.0 in Accession #:    [id number]         Weight:       203.7 lb Date of Birth:  1950-08-20         BSA:          1.904 m Patient Age:    71 years           BP:           149/97 mmHg Patient Gender: F                  HR:           135 bpm. Exam Location:  Inpatient Procedure: Transesophageal Echo, Color Doppler and Cardiac Doppler Indications:      Bateremia, A-fib  History:         Patient has prior history of Echocardiogram examinations, most                  recent 10/03/2019. Risk Factors:Hypertension.  Sonographer:     Raquel Sarna Senior RDCS Referring Phys:  [id number] Denise Becker Diagnosing Phys: [place] PROCEDURE: After discussion of the risks and benefits of a TEE, an informed  consent was obtained from the patient. The transesophogeal probe was passed without difficulty through the esophogus of the patient. Sedation performed by different physician. The patient was monitored while under deep sedation. Anesthestetic sedation was provided intravenously by Anesthesiology: '121mg'$  of Propofol. The patient developed no complications during the procedure. A successful direct current cardioversion was performed at 200 joules with 2 attempts. IMPRESSIONS  1. Left ventricular ejection fraction, by estimation, is 60 to 65%. The left ventricle has normal function.  2. Right ventricular systolic function is normal. The right ventricular size is normal.  3. No left atrial/left atrial appendage thrombus was detected. The LAA emptying velocity was 48 cm/s.  4. The mitral valve is normal in structure. No evidence of mitral valve regurgitation.  5. The aortic valve is normal in structure. Aortic valve regurgitation is not visualized. Conclusion(s)/Recommendation(s): No evidence of vegetation/infective endocarditis on this transesophageael echocardiogram. No LA/LAA thrombus identified. Negative bubble study for interatrial shunt. No intracardiac source of embolism detected on this on this transesophageal echocardiogram. FINDINGS  Left Ventricle: Left ventricular ejection fraction, by estimation, is 60 to 65%. The left ventricle has normal function. The left ventricular internal cavity size was normal in size. Right Ventricle: The right ventricular size is normal. Right ventricular systolic function is normal. Left Atrium: Left atrial size was normal in size. No left atrial/left  atrial appendage thrombus was detected. The LAA emptying velocity was 48 cm/s. Right Atrium: Right atrial size was normal in size. Pericardium: There is no evidence of pericardial effusion. Mitral Valve: The mitral valve is normal in structure. No evidence of mitral valve regurgitation. Tricuspid Valve: The tricuspid valve is normal in structure. Tricuspid valve regurgitation is not demonstrated. Aortic Valve: The aortic valve is normal in structure. Aortic valve regurgitation is not visualized. Pulmonic Valve: The pulmonic valve was normal in structure. Pulmonic valve regurgitation is not visualized. Aorta: The aortic root and ascending aorta are structurally normal, with no evidence of dilitation. IAS/Shunts: No atrial level shunt detected by color flow Doppler. Phineas Inches Electronically signed by Phineas Inches Signature Date/Time: 11/26/2021/1:18:40 PM    Final    DG FL GUIDED LUMBAR PUNCTURE  Result Date: 11/23/2021 CLINICAL DATA:  71 year old female with neck stiffness and bacteremia, concern for meningitis. Request for diagnostic fluoro guided lumbar puncture. EXAM: DIAGNOSTIC LUMBAR PUNCTURE UNDER FLUOROSCOPIC GUIDANCE COMPARISON:  None Available. FLUOROSCOPY: Radiation Exposure Index (as provided by the fluoroscopic device): 1.3 mGy Kerma PROCEDURE: Signed informed consent was obtained from the patient prior to the procedure, including potential complications of bleeding, infection, headache, allergy, and pain. Safety time-out was performed prior to the procedure confirming the correct patent and correct procedure. With the patient prone, the lower back was prepped with Betadine. 1% Lidocaine was used for local anesthesia. Lumbar puncture was performed at the L4-5 level using a 20 gauge needle with return of clear CSF with an opening pressure of 29 cm water. 22 ml of CSF were obtained for laboratory studies. Closing pressure was 14 cm of water. The patient tolerated the procedure well and there were no  apparent complications. Postprocedural orders were placed. IMPRESSION: Successful fluoroscopic guided lumbar puncture at L4-5. No immediate complication. The procedure was performed by Durenda Guthrie, PA-C under supervision of Earle Gell, MD. Electronically Signed   By: Marlaine Hind M.D.   On: 11/23/2021 16:40   CT HEAD WO CONTRAST (5MM)  Result Date: 11/23/2021 CLINICAL DATA:  Meningitis. CNS infection suspected. EXAM: CT HEAD WITHOUT CONTRAST TECHNIQUE: Contiguous axial images were obtained  from the base of the skull through the vertex without intravenous contrast. RADIATION DOSE REDUCTION: This exam was performed according to the departmental dose-optimization program which includes automated exposure control, adjustment of the mA and/or kV according to patient size and/or use of iterative reconstruction technique. COMPARISON:  CT head without contrast 09/06/2019 FINDINGS: Brain: No acute infarct, hemorrhage, or mass lesion is present. No significant white matter lesions are present. The ventricles are of normal size. No significant extraaxial fluid collection is present. The brainstem and cerebellum are within normal limits. Vascular: Atherosclerotic calcifications are present within the cavernous internal carotid arteries bilaterally. No hyperdense vessel present. Skull: Calvarium is intact. No focal lytic or blastic lesions are present. No significant extracranial soft tissue lesion is present. Sinuses/Orbits: The paranasal sinuses and mastoid air cells are clear. The globes and orbits are within normal limits. IMPRESSION: 1. Normal CT appearance of the brain. 2. Atherosclerosis. Electronically Signed   By: San Morelle M.D.   On: 11/23/2021 16:30   MR CERVICAL SPINE WO CONTRAST  Result Date: 11/22/2021 CLINICAL DATA:  Chronic neck pain EXAM: MRI CERVICAL SPINE WITHOUT CONTRAST TECHNIQUE: Multiplanar, multisequence MR imaging of the cervical spine was performed. No intravenous contrast was  administered. COMPARISON:  None Available. FINDINGS: Alignment: Physiologic. Vertebrae: No fracture, evidence of discitis, or bone lesion. Cord: Normal signal and morphology. Posterior Fossa, vertebral arteries, paraspinal tissues: Negative. Disc levels: C1-2: Unremarkable. C2-3: Normal disc space and facet joints. There is no spinal canal stenosis. No neural foraminal stenosis. C3-4: Left foraminal disc protrusion. There is no spinal canal stenosis. Moderate left neural foraminal stenosis. C4-5: Normal disc space and facet joints. There is no spinal canal stenosis. No neural foraminal stenosis. C5-6: Small disc bulge. There is no spinal canal stenosis. Mild bilateral neural foraminal stenosis. C6-7: Small disc bulge. There is no spinal canal stenosis. No neural foraminal stenosis. C7-T1: Normal disc space and facet joints. There is no spinal canal stenosis. No neural foraminal stenosis. IMPRESSION: 1. Left foraminal disc protrusion at C3-4 with moderate left neural foraminal stenosis. 2. Mild bilateral C5-6 neural foraminal stenosis. 3. No spinal canal stenosis. Electronically Signed   By: Ulyses Jarred M.D.   On: 11/22/2021 23:39   CT CHEST ABDOMEN PELVIS WO CONTRAST  Result Date: 11/21/2021 CLINICAL DATA:  Abdominal pain, acute, nonlocalized. Elevated white count EXAM: CT CHEST, ABDOMEN AND PELVIS WITHOUT CONTRAST TECHNIQUE: Multidetector CT imaging of the chest, abdomen and pelvis was performed following the standard protocol without IV contrast. RADIATION DOSE REDUCTION: This exam was performed according to the departmental dose-optimization program which includes automated exposure control, adjustment of the mA and/or kV according to patient size and/or use of iterative reconstruction technique. COMPARISON:  08/26/2013 FINDINGS: CT CHEST FINDINGS Cardiovascular: Heart is normal size. Aorta normal caliber. Scattered aortic calcifications. Mediastinum/Nodes: No mediastinal, hilar, or axillary adenopathy.  Trachea and esophagus are unremarkable. Thyroid unremarkable. Lungs/Pleura: Bibasilar opacities, likely atelectasis. No effusions or confluent opacities. Musculoskeletal: Prior right mastectomy. Chest wall soft tissues unremarkable. No acute bony abnormality. CT ABDOMEN PELVIS FINDINGS Hepatobiliary: No focal hepatic abnormality. Gallbladder unremarkable. Pancreas: No focal abnormality or ductal dilatation. Spleen: No focal abnormality.  Normal size. Adrenals/Urinary Tract: Adrenal glands unremarkable. 2.3 cm low-density lesion in the midpole of the left kidney, likely cyst. No follow-up imaging recommended. No stones or hydronephrosis. Urinary bladder decompressed, grossly unremarkable. Stomach/Bowel: Sigmoid diverticulosis. No active diverticulitis. Stomach and small bowel decompressed, unremarkable. Large ventral hernia containing much of the small bowel as well as the right colon  and transverse colon. A portion of the distal stomach is also within the hernia. No bowel obstruction. Vascular/Lymphatic: Scattered aortic atherosclerosis. No evidence of aneurysm or adenopathy. Reproductive: Prior hysterectomy.  No adnexal masses. Other: No free fluid or free air. Musculoskeletal: No acute bony abnormality. IMPRESSION: No acute findings in the chest, abdomen or pelvis. Bibasilar atelectasis. Large midline ventral hernia containing distal stomach, much of the small bowel, right colon and transverse colon. No bowel obstruction. Aortic atherosclerosis. Sigmoid diverticulosis. Electronically Signed   By: Rolm Baptise M.D.   On: 11/21/2021 22:20   CT Soft Tissue Neck Wo Contrast  Result Date: 11/21/2021 CLINICAL DATA:  Soft tissue swelling EXAM: CT NECK WITHOUT CONTRAST TECHNIQUE: Multidetector CT imaging of the neck was performed following the standard protocol without intravenous contrast. RADIATION DOSE REDUCTION: This exam was performed according to the departmental dose-optimization program which includes  automated exposure control, adjustment of the mA and/or kV according to patient size and/or use of iterative reconstruction technique. COMPARISON:  None Available. FINDINGS: PHARYNX AND LARYNX: The nasopharynx, oropharynx and larynx are normal. Visible portions of the oral cavity, tongue base and floor of mouth are normal. Normal epiglottis, vallecula and pyriform sinuses. The larynx is normal. No retropharyngeal abscess, effusion or lymphadenopathy. SALIVARY GLANDS: Normal parotid, submandibular and sublingual glands. THYROID: Normal. LYMPH NODES: No enlarged or abnormal density lymph nodes. VASCULAR: Negative LIMITED INTRACRANIAL: Normal. VISUALIZED ORBITS: Normal. MASTOIDS AND VISUALIZED PARANASAL SINUSES: No fluid levels or advanced mucosal thickening. No mastoid effusion. SKELETON: No bony spinal canal stenosis. No lytic or blastic lesions. UPPER CHEST: Clear. OTHER: None. IMPRESSION: Normal CT of the neck. Electronically Signed   By: Ulyses Jarred M.D.   On: 11/21/2021 22:20   DG Bone Density  Result Date: 11/05/2021 EXAM: DUAL X-RAY ABSORPTIOMETRY (DXA) FOR BONE MINERAL DENSITY IMPRESSION: Referring Physician:  Benay Pike Your patient completed a bone mineral density test using GE Lunar iDXA system (analysis version: 16). Technologist: [place] PATIENT: Name: Fatema, Rabe Patient ID: [id number] Birth Date: October 25, 1950 Height: 59.5 in. Sex: Female Measured: 11/05/2021 Weight: 203.0 lbs. Indications: Advanced Age, Bilateral Ovariectomy (65.51), Estrogen Deficient, Hysterectomy, Postmenopausal Fractures: NONE Treatments: None ASSESSMENT: The BMD measured at AP Spine L1-L4 (L3) is 0.930 g/cm2 with a T-score of -2.0. This patient is considered osteopenic/low bone mass according to [facility]) criteria. The quality of the exam is limited by patient's body habitus. L3 was excluded due to degenerative changes. Site Region Measured Date Measured Age YA BMD Significant CHANGE T-score AP Spine  L1-L4 (L3) 11/05/2021 71.6 -2.0 0.930 g/cm2 DualFemur Neck Left 11/05/2021 71.6 -1.1 0.887 g/cm2 DualFemur Total Mean 11/05/2021 71.6 -0.1 0.994 g/cm2 Left Forearm Radius 33% 11/05/2021 71.6 -0.6 0.826 g/cm2 World Health Organization [facility]) criteria for post-menopausal, Caucasian Women: Normal       T-score at or above -1 SD Osteopenia   T-score between -1 and -2.5 SD Osteoporosis T-score at or below -2.5 SD RECOMMENDATION: 1. All patients should optimize calcium and vitamin D intake. 2. Consider FDA-approved medical therapies in postmenopausal women and men aged 36 years and older, based on the following: a. A hip or vertebral (clinical or morphometric) fracture. b. T-score = -2.5 at the femoral neck or spine after appropriate evaluation to exclude secondary causes. c. Low bone mass (T-score between -1.0 and -2.5 at the femoral neck or spine) and a 10-year probability of a hip fracture = 3% or a 10-year probability of a major osteoporosis-related fracture = 20% based on the [place]-adapted [facility]  algorithm. d. Clinician judgment and/or patient preferences may indicate treatment for people with 10-year fracture probabilities above or below these levels. FOLLOW-UP: Patients with diagnosis of osteoporosis or at high risk for fracture should have regular bone mineral density tests.? Patients eligible for Medicare are allowed routine testing every 2 years.? The testing frequency can be increased to one year for patients who have rapidly progressing disease, are receiving or discontinuing medical therapy to restore bone mass, or have additional risk factors. I have reviewed this study and agree with the findings. [facility] FRAX* 10-year Probability of Fracture Based on femoral neck BMD: DualFemur (Left) Major Osteoporotic Fracture: 3.7% Hip Fracture:                0.4% Population:                  [place] ([place]) Risk Factors:                None *FRAX is a Materials engineer of the [employer] of [employer] for Metabolic Bone Disease, a [place] (WHO) [facility]. ASSESSMENT: The probability of a major osteoporotic fracture is 3.7 % within the next ten years. The probability of a hip fracture is 0.4 % within the next ten years. Electronically Signed   By: Elmer Picker M.D.   On: 11/05/2021 13:26   Labs:   Basic Metabolic Panel: Recent Labs  Lab 11/21/21 1952 11/23/21 0507 11/24/21 0416 11/25/21 0345  NA 133* 138 140 139  K 3.7 3.8 3.5 3.5  CL 100 109 109 104  CO2 '25 24 25 28  '$ GLUCOSE 89 99 103* 103*  BUN 44* 25* 12 11  CREATININE 1.99* 1.05* 0.77 0.78  CALCIUM 9.0 8.7* 8.6* 9.1     CBC: Recent Labs  Lab 11/21/21 2027 11/22/21 0654 11/23/21 0507 11/24/21 0416 11/25/21 0345 11/26/21 0502  WBC 26.4* 20.4* 15.2* 14.0* 13.1* 11.8*  NEUTROABS 25.3* 18.2*  --   --   --   --   HGB 11.9* 12.7 12.4 12.3 12.7 13.1  HCT 36.0 38.2 39.0 37.9 38.3 40.1  MCV 85.1 84.7 86.7 86.3 84.5 84.6  PLT 164 196 196 200 220 248         SIGNED:   Debbe Odea, MD  [facility] 11/27/2021, 1:27 PM

## 2021-11-27 NOTE — TOC Transition Note (Signed)
Transition of Care [facility]) - CM/SW Discharge Note   Patient Details  Name: Denise Becker MRN: [id number] Date of Birth: [birth year]  Transition of Care [facility] CM/SW Contact:  Roseanne Kaufman, RN Phone Number: 11/27/2021, 2:09 PM   Clinical Narrative:    Sent outpatient PT therapy referral.  No additional TOC needs    Final next level of care: Home/Self Care Barriers to Discharge: No Barriers Identified   Patient Goals and CMS Choice Patient states their goals for this hospitalization and ongoing recovery are:: feel better at home   Choice offered to / list presented to : NA  Discharge Placement                       Discharge Plan and Services In-house Referral: NA Discharge Planning Services: CM Consult Post Acute Care Choice: NA          DME Arranged: N/A DME Agency: NA       HH Arranged: NA HH Agency: NA        Social Determinants of Health (SDOH) Interventions     Readmission Risk Interventions     No data to display

## 2021-11-27 NOTE — Plan of Care (Signed)
  Problem: Education: Goal: Knowledge of General Education information will improve Description: Including pain rating scale, medication(s)/side effects and non-pharmacologic comfort measures 11/27/2021 1543 by Marlou Sa, RN Outcome: Completed/Met 11/27/2021 0809 by Marlou Sa, RN Outcome: Progressing   Problem: Health Behavior/Discharge Planning: Goal: Ability to manage health-related needs will improve 11/27/2021 1543 by Marlou Sa, RN Outcome: Completed/Met 11/27/2021 0809 by Marlou Sa, RN Outcome: Progressing   Problem: Clinical Measurements: Goal: Ability to maintain clinical measurements within normal limits will improve Outcome: Completed/Met Goal: Will remain free from infection Outcome: Completed/Met Goal: Diagnostic test results will improve 11/27/2021 1543 by Marlou Sa, RN Outcome: Completed/Met 11/27/2021 0809 by Marlou Sa, RN Outcome: Progressing Goal: Respiratory complications will improve Outcome: Completed/Met Goal: Cardiovascular complication will be avoided Outcome: Completed/Met   Problem: Activity: Goal: Risk for activity intolerance will decrease 11/27/2021 1543 by Marlou Sa, RN Outcome: Completed/Met 11/27/2021 0809 by Marlou Sa, RN Outcome: Progressing   Problem: Nutrition: Goal: Adequate nutrition will be maintained 11/27/2021 1543 by Marlou Sa, RN Outcome: Completed/Met 11/27/2021 0809 by Marlou Sa, RN Outcome: Progressing   Problem: Coping: Goal: Level of anxiety will decrease 11/27/2021 1543 by Marlou Sa, RN Outcome: Completed/Met 11/27/2021 0809 by Marlou Sa, RN Outcome: Progressing   Problem: Elimination: Goal: Will not experience complications related to bowel motility Outcome: Completed/Met Goal: Will not experience complications related to urinary retention Outcome: Completed/Met   Problem: Pain Managment: Goal: General experience of comfort will improve Outcome: Completed/Met   Problem: Safety: Goal:  Ability to remain free from injury will improve Outcome: Completed/Met   Problem: Skin Integrity: Goal: Risk for impaired skin integrity will decrease Outcome: Completed/Met

## 2021-11-29 LAB — CULTURE, BLOOD (ROUTINE X 2)
Culture: NO GROWTH
Culture: NO GROWTH

## 2021-11-30 ENCOUNTER — Encounter (HOSPITAL_COMMUNITY): Payer: Self-pay | Admitting: Internal Medicine

## 2021-12-10 DIAGNOSIS — D72829 Elevated white blood cell count, unspecified: Secondary | ICD-10-CM | POA: Diagnosis not present

## 2021-12-10 DIAGNOSIS — N289 Disorder of kidney and ureter, unspecified: Secondary | ICD-10-CM | POA: Diagnosis not present

## 2021-12-10 DIAGNOSIS — R519 Headache, unspecified: Secondary | ICD-10-CM | POA: Diagnosis not present

## 2021-12-10 DIAGNOSIS — I480 Paroxysmal atrial fibrillation: Secondary | ICD-10-CM | POA: Diagnosis not present

## 2021-12-10 DIAGNOSIS — M62838 Other muscle spasm: Secondary | ICD-10-CM | POA: Diagnosis not present

## 2021-12-16 ENCOUNTER — Encounter: Payer: Self-pay | Admitting: Cardiology

## 2021-12-16 ENCOUNTER — Ambulatory Visit: Payer: Medicare HMO | Admitting: Cardiology

## 2021-12-16 VITALS — BP 132/63 | HR 67 | Temp 98.1°F | Resp 16 | Ht 61.0 in | Wt 190.8 lb

## 2021-12-16 DIAGNOSIS — I480 Paroxysmal atrial fibrillation: Secondary | ICD-10-CM | POA: Diagnosis not present

## 2021-12-16 DIAGNOSIS — I10 Essential (primary) hypertension: Secondary | ICD-10-CM

## 2021-12-16 DIAGNOSIS — Z7901 Long term (current) use of anticoagulants: Secondary | ICD-10-CM

## 2021-12-16 NOTE — Progress Notes (Signed)
Date:  12/16/2021   ID:  Denise Becker, DOB 04-Nov-1950, MRN [id number]  PCP:  Leeroy Cha, MD  Cardiologist:  Rex Kras, DO, [facility] (established care 09/23/2019)  Date: 12/16/21 Last Office Visit: 06/06/2021   Chief Complaint  Patient presents with   Hospitalization Follow-up   post TEE    HPI  Denise Becker is a 71 y.o. female whose past medical history and cardiovascular risk factors include: History of breast cancer status post mastectomy/radiation/chemotherapy, MGUS, hypertension, paroxysmal atrial fibrillation status post TEE guided cardioversion (August 2023), postmenopausal female, advanced age, obesity due to excess calories.  Patient was referred to the practice in June 2021 for blood pressure management and failed to follow-up regularly as requested.  Since last office visit patient was hospitalized in August 2023 and diagnosed with bacteremia as well as A-fib with RVR.  She underwent TEE guided cardioversion.  This procedure was performed by [facility] heart care Dr. Harl Bowie patient required 200 J x 2 to convert to normal sinus tachycardia with PVCs.  She was started on Eliquis for thromboembolic prophylaxis and now presents for posthospitalization follow-up.  Recent hospitalization records reviewed with the patient.  Pathophysiology of atrial fibrillation discussed in detail.  Patient is compliant with Eliquis for thromboembolic prophylaxis given her recent direct-current cardioversion.  Discussed the risks, benefits, and alternatives to oral anticoagulation.  FUNCTIONAL STATUS: No structured exercise program or daily routine.    ALLERGIES: Allergies  Allergen Reactions   Effexor [Venlafaxine] Other (See Comments)    Made her nervous   Ciprofloxacin Diarrhea    Patient developed severe diarrhea on cipro   Penicillins Rash    Did it involve swelling of the face/tongue/throat, SOB, or low BP? No Did it involve sudden or severe rash/hives, skin peeling, or  any reaction on the inside of your mouth or nose? Yes Did you need to seek medical attention at a hospital or doctor's office? No When did it last happen? Several Years Ago       If all above answers are "NO", may proceed with cephalosporin use.      MEDICATION LIST PRIOR TO VISIT: Current Meds  Medication Sig   amLODipine (NORVASC) 10 MG tablet Take 10 mg by mouth daily.   apixaban (ELIQUIS) 5 MG TABS tablet Take 1 tablet (5 mg total) by mouth 2 (two) times daily.   losartan (COZAAR) 100 MG tablet Take 1 tablet (100 mg total) by mouth daily.   metoprolol tartrate (LOPRESSOR) 25 MG tablet Take 1 tablet (25 mg total) by mouth 2 (two) times daily.   Multiple Vitamins-Minerals (MULTIVITAMIN ADULT) CHEW Chew 2 tablets by mouth 2 (two) times a week.   omeprazole (PRILOSEC) 20 MG capsule Take 1 capsule (20 mg total) by mouth daily before breakfast. 30 mins before food ideally (Patient taking differently: Take 20 mg by mouth daily as needed (indigestion).)   tamoxifen (NOLVADEX) 20 MG tablet Take 1 tablet (20 mg total) by mouth daily.   tiZANidine (ZANAFLEX) 4 MG tablet Take 4 mg by mouth 3 (three) times daily.     PAST MEDICAL HISTORY: Past Medical History:  Diagnosis Date   Anxiety    anxiousabout impending surgery- loss of breast   Arthritis    "joints hurt q now and then" (10/25/2013)   Breast cancer ([place])    Breast cancer of upper-outer quadrant of right female breast ([place]) 03/10/2013   Depression    history of depression after son died in the 2000's   GERD (gastroesophageal  reflux disease)    "just chemo related"   H/O hiatal hernia    Hypertension    Radiation 12/08/13-01/17/14   Right chest wall 50.4 Gy   Spontaneous dislocation of shoulder [phone]   "right; a few times over the years"    PAST SURGICAL HISTORY: Past Surgical History:  Procedure Laterality Date   ABDOMINAL HYSTERECTOMY  1990's   BREAST BIOPSY Right 02/2013   CARDIOVERSION  11/26/2021   Procedure:  CARDIOVERSION;  Surgeon: Janina Mayo, MD;  Location: [place];  Service: Cardiovascular;;   [place] / SIMPLE W/ SENTINEL NODE BIOPSY Right 10/25/2013   MASTECTOMY W/ SENTINEL NODE BIOPSY Right 10/25/2013   Procedure: RIGHT TOTAL MASTECTOMY WITH SENTINEL LYMPH NODE BIOPSY;  Surgeon: Adin Hector, MD;  Location: [place];  Service: General;  Laterality: Right;   MULTIPLE TOOTH EXTRACTIONS Bilateral 2011-2014 X 6   PORT A CATH REVISION Right 04/29/2013   Procedure: PORT A CATH REVISION;  Surgeon: Adin Hector, MD;  Location: [place];  Service: General;  Laterality: Right;   PORT-A-CATH REMOVAL  10/25/2013   PORT-A-CATH REMOVAL N/A 10/25/2013   Procedure: REMOVAL PORT-A-CATH;  Surgeon: Adin Hector, MD;  Location: [place];  Service: General;  Laterality: N/A;   PORTACATH PLACEMENT Right 04/15/2013   Procedure: INSERTION PORT-A-CATH;  Surgeon: Adin Hector, MD;  Location: [place];  Service: General;  Laterality: Right;   TEE WITHOUT CARDIOVERSION N/A 11/26/2021   Procedure: TRANSESOPHAGEAL ECHOCARDIOGRAM (TEE);  Surgeon: Janina Mayo, MD;  Location: [facility] ENDOSCOPY;  Service: Cardiovascular;  Laterality: N/A;   TONSILLECTOMY     WISDOM TOOTH EXTRACTION      FAMILY HISTORY: The patient family history includes Bowel Disease in her mother; Cancer in her father; Heart attack in her maternal uncle; Heart disease in her maternal grandfather, maternal grandmother, and maternal uncle; Hypertension in her mother; Lung disease in her father.  SOCIAL HISTORY:  The patient  reports that she quit smoking about 20 years ago. Her smoking use included cigarettes. She has a 0.75 pack-year smoking history. She has never used smokeless tobacco. She reports current alcohol use. She reports that she does not use drugs.  REVIEW OF SYSTEMS: Review of Systems  Cardiovascular:  Negative for chest pain, claudication, dyspnea on exertion,  irregular heartbeat, leg swelling, near-syncope, orthopnea, palpitations, paroxysmal nocturnal dyspnea and syncope.  Respiratory:  Negative for shortness of breath.   Hematologic/Lymphatic: Negative for bleeding problem.  Musculoskeletal:  Negative for muscle cramps and myalgias.  Neurological:  Negative for dizziness and light-headedness.    PHYSICAL EXAM:    12/16/2021   12:08 PM 11/27/2021    3:25 PM 11/27/2021   12:19 PM  Vitals with BMI  Height _0     Weight 190 lbs 13 oz    BMI 97.02    Systolic 637 858 850  Diastolic 63 71 68  Pulse 67 79 89   CONSTITUTIONAL: Appears older than stated age, hemodynamically stable, no acute distress.   Walks with a walker SKIN: Skin is warm and dry. No rash noted. No cyanosis. No pallor. No jaundice HEAD: Normocephalic and atraumatic.  EYES: No scleral icterus MOUTH/THROAT: Moist oral membranes.  NECK: No JVD present. No thyromegaly noted. No carotid bruits  LYMPHATIC: No visible cervical adenopathy.  CHEST Normal respiratory effort. No intercostal retractions.  Right mastectomy LUNGS: Clear to auscultation bilaterally.  No stridor. No wheezes. No rales.  CARDIOVASCULAR: Regular, positive  S1-S2, no murmurs rubs or gallops appreciated ABDOMINAL: Obese, soft, nontender, nondistended, positive bowel sounds in all 4 quadrants.  No apparent ascites.  EXTREMITIES: Trace nonpitting peripheral edema.  HEMATOLOGIC: No significant bruising NEUROLOGIC: Oriented to person, place, and time. Nonfocal. Normal muscle tone.  PSYCHIATRIC: Normal mood and affect. Normal behavior. Cooperative  CARDIAC DATABASE: EKG: 06/06/2020: Normal sinus rhythm, 80 bpm, normal axis, nonspecific ST-T changes lateral leads. 12/16/2021: Sinus bradycardia 58 bpm without underlying ischemia injury pattern.  Echocardiogram: 03/29/2013: LVEF 55-60%, normal wall motion, no regional wall motion abnormalities, average global longitudinal strain -18.6%.    09/29/2019: LVEF 55%,  Mild LVH, Grade II diastolic dysfunction, mildly LAE.   11/26/2021 transesophageal echocardiograms plus direct-current cardioversion Dr. Phineas Inches:  1. Left ventricular ejection fraction, by estimation, is 60 to 65%. The left ventricle has normal function.   2. Right ventricular systolic function is normal. The right ventricular size is normal.   3. No left atrial/left atrial appendage thrombus was detected. The LAA  emptying velocity was 48 cm/s.   4. The mitral valve is normal in structure. No evidence of mitral valve regurgitation.   5. The aortic valve is normal in structure. Aortic valve regurgitation is not visualized.   Conclusion(s)/Recommendation(s): No evidence of vegetation/infective endocarditis on this transesophageael echocardiogram. No LA/LAA thrombus identified. Negative bubble study for interatrial shunt. No intracardiac source of embolism detected on this on  this transesophageal echocardiogram.   Stress Testing: None  Heart Catheterization: None  Lower Extremity Venous Duplex  09/29/2019:  No evidence of deep vein thrombosis of the lower extremities with normal venous return.   LABORATORY DATA:    Latest Ref Rng & Units 11/26/2021    5:02 AM 11/25/2021    3:45 AM 11/24/2021    4:16 AM  CBC  WBC 4.0 - 10.5 K/uL 11.8  13.1  14.0   Hemoglobin 12.0 - 15.0 g/dL 13.1  12.7  12.3   Hematocrit 36.0 - 46.0 % 40.1  38.3  37.9   Platelets 150 - 400 K/uL 248  220  200    BMP Recent Labs    11/23/21 0507 11/24/21 0416 11/25/21 0345  NA 138 140 139  K 3.8 3.5 3.5  CL 109 109 104  CO2 _0 GLUCOSE 99 103* 103*  BUN 25* 12 11  CREATININE 1.05* 0.77 0.78  CALCIUM 8.7* 8.6* 9.1  GFRNONAA 57* >60 >60   External Labs: Collected: 08/23/2019 Creatinine 0.87 mg/dL. eGFR: 79 mL/min per 1.73 m Lipid profile: Total cholesterol 183, triglycerides 62, HDL 56, LDL 113, non-HDL 127 TSH: 2.46   IMPRESSION:    ICD-10-CM   1. Paroxysmal atrial fibrillation (HCC)  I48.0      2. Long term (current) use of anticoagulants  Z79.01     3. Essential hypertension, benign  I10 EKG 12-Lead       RECOMMENDATIONS: SHRONDA BOEH is a 71 y.o. female whose past medical history and cardiac risk factors include: History of breast cancer status post mastectomy/radiation/chemotherapy, hypertension, postmenopausal female, advanced age, obesity due to excess calories.  Paroxysmal atrial fibrillation (HCC) Rate control: Lopressor. Rhythm control: N/A. Thromboembolic prophylaxis: Eliquis CHA2DS2-VASc SCORE is 3 which correlates to 3.2 % risk of stroke per year (hypertension, age, gender).  Patient was recently diagnosed with A-fib with RVR in the setting of group B strep bacteremia. Recommend oral anticoagulation for 3 months and could consider loop recorder for long-term monitoring. We will discuss at the next office visit.  Long term (  current) use of anticoagulants Indication: Paroxysmal atrial fibrillation status post cardioversion Discussed the risks, benefits, and alternatives to oral anticoagulation. Patient is asked to seek medical attention by going to the closest ER via EMS if she has any evidence of bleeding or sustains injury despite the mechanism of action to rule out internal bleeding.  Essential hypertension, benign Office blood pressures are well controlled. Medications reconciled. No changes warranted at this time.   FINAL MEDICATION LIST END OF ENCOUNTER: No orders of the defined types were placed in this encounter.   There are no discontinued medications.   Current Outpatient Medications:    amLODipine (NORVASC) 10 MG tablet, Take 10 mg by mouth daily., Disp: , Rfl:    apixaban (ELIQUIS) 5 MG TABS tablet, Take 1 tablet (5 mg total) by mouth 2 (two) times daily., Disp: 60 tablet, Rfl: 0   losartan (COZAAR) 100 MG tablet, Take 1 tablet (100 mg total) by mouth daily., Disp: 7 tablet, Rfl: 0   metoprolol tartrate (LOPRESSOR) 25 MG tablet, Take 1  tablet (25 mg total) by mouth 2 (two) times daily., Disp: 30 tablet, Rfl: 1   Multiple Vitamins-Minerals (MULTIVITAMIN ADULT) CHEW, Chew 2 tablets by mouth 2 (two) times a week., Disp: , Rfl:    omeprazole (PRILOSEC) 20 MG capsule, Take 1 capsule (20 mg total) by mouth daily before breakfast. 30 mins before food ideally (Patient taking differently: Take 20 mg by mouth daily as needed (indigestion).), Disp: 90 capsule, Rfl: 0   tamoxifen (NOLVADEX) 20 MG tablet, Take 1 tablet (20 mg total) by mouth daily., Disp: 90 tablet, Rfl: 4   tiZANidine (ZANAFLEX) 4 MG tablet, Take 4 mg by mouth 3 (three) times daily., Disp: , Rfl:   Orders Placed This Encounter  Procedures   EKG 12-Lead    There are no Patient Instructions on file for this visit.  --Continue cardiac medications as reconciled in final medication list. --Return in about 3 months (around 03/17/2022) for Follow up, A. fib. Or sooner if needed. --Continue follow-up with your primary care physician regarding the management of your other chronic comorbid conditions.  Patient's questions and concerns were addressed to her satisfaction. She voices understanding of the instructions provided during this encounter.   This note was created using a voice recognition software as a result there may be grammatical errors inadvertently enclosed that do not reflect the nature of this encounter. Every attempt is made to correct such errors.  Rex Kras, [place], [facility]  Pager: [phone] Office: [phone]

## 2021-12-23 ENCOUNTER — Ambulatory Visit: Payer: Medicare HMO

## 2021-12-24 ENCOUNTER — Other Ambulatory Visit: Payer: Self-pay

## 2021-12-24 MED ORDER — APIXABAN 5 MG PO TABS: 5.0000 mg | ORAL_TABLET | Freq: Two times a day (BID) | ORAL | 3 refills | Status: DC

## 2021-12-31 ENCOUNTER — Other Ambulatory Visit: Payer: Medicare HMO

## 2021-12-31 ENCOUNTER — Ambulatory Visit: Payer: Medicare HMO | Admitting: Hematology and Oncology

## 2022-01-07 ENCOUNTER — Other Ambulatory Visit: Payer: Self-pay

## 2022-01-07 DIAGNOSIS — M542 Cervicalgia: Secondary | ICD-10-CM | POA: Diagnosis not present

## 2022-01-07 DIAGNOSIS — Z170 Estrogen receptor positive status [ER+]: Secondary | ICD-10-CM

## 2022-01-08 ENCOUNTER — Inpatient Hospital Stay: Payer: Medicare HMO | Admitting: Physician Assistant

## 2022-01-08 ENCOUNTER — Inpatient Hospital Stay: Payer: Medicare HMO

## 2022-01-10 ENCOUNTER — Ambulatory Visit: Payer: Medicare HMO

## 2022-01-15 DIAGNOSIS — M4692 Unspecified inflammatory spondylopathy, cervical region: Secondary | ICD-10-CM | POA: Diagnosis not present

## 2022-01-15 DIAGNOSIS — D649 Anemia, unspecified: Secondary | ICD-10-CM | POA: Diagnosis not present

## 2022-01-15 DIAGNOSIS — I10 Essential (primary) hypertension: Secondary | ICD-10-CM | POA: Diagnosis not present

## 2022-01-15 DIAGNOSIS — I480 Paroxysmal atrial fibrillation: Secondary | ICD-10-CM | POA: Diagnosis not present

## 2022-01-15 DIAGNOSIS — Z23 Encounter for immunization: Secondary | ICD-10-CM | POA: Diagnosis not present

## 2022-01-20 ENCOUNTER — Other Ambulatory Visit: Payer: Self-pay | Admitting: Physician Assistant

## 2022-01-21 ENCOUNTER — Inpatient Hospital Stay: Payer: Medicare HMO | Attending: Physician Assistant

## 2022-01-21 ENCOUNTER — Inpatient Hospital Stay: Payer: Medicare HMO | Admitting: Physician Assistant

## 2022-01-29 ENCOUNTER — Other Ambulatory Visit: Payer: Self-pay

## 2022-01-29 MED ORDER — METOPROLOL TARTRATE 25 MG PO TABS: 25.0000 mg | ORAL_TABLET | Freq: Two times a day (BID) | ORAL | 1 refills | Status: DC

## 2022-02-06 DIAGNOSIS — M542 Cervicalgia: Secondary | ICD-10-CM | POA: Diagnosis not present

## 2022-02-06 DIAGNOSIS — M5451 Vertebrogenic low back pain: Secondary | ICD-10-CM | POA: Diagnosis not present

## 2022-02-19 ENCOUNTER — Ambulatory Visit
Admission: RE | Admit: 2022-02-19 | Discharge: 2022-02-19 | Disposition: A | Discharge status: Home / Self Care | Payer: Medicare HMO | Source: Ambulatory Visit | Attending: Hematology and Oncology | Admitting: Hematology and Oncology

## 2022-02-19 DIAGNOSIS — Z170 Estrogen receptor positive status [ER+]: Secondary | ICD-10-CM

## 2022-02-19 DIAGNOSIS — Z1231 Encounter for screening mammogram for malignant neoplasm of breast: Secondary | ICD-10-CM | POA: Diagnosis not present

## 2022-02-27 DIAGNOSIS — M5451 Vertebrogenic low back pain: Secondary | ICD-10-CM | POA: Diagnosis not present

## 2022-02-27 DIAGNOSIS — M542 Cervicalgia: Secondary | ICD-10-CM | POA: Diagnosis not present

## 2022-03-12 ENCOUNTER — Inpatient Hospital Stay: Payer: Medicare HMO | Attending: Physician Assistant

## 2022-03-12 ENCOUNTER — Inpatient Hospital Stay: Payer: Medicare HMO | Admitting: Physician Assistant

## 2022-03-12 VITALS — BP 149/69 | HR 123 | Temp 97.5°F | Resp 18 | Ht 61.0 in | Wt 203.1 lb

## 2022-03-12 DIAGNOSIS — Z853 Personal history of malignant neoplasm of breast: Secondary | ICD-10-CM | POA: Insufficient documentation

## 2022-03-12 DIAGNOSIS — Z170 Estrogen receptor positive status [ER+]: Secondary | ICD-10-CM | POA: Diagnosis not present

## 2022-03-12 DIAGNOSIS — D472 Monoclonal gammopathy: Secondary | ICD-10-CM | POA: Diagnosis not present

## 2022-03-12 DIAGNOSIS — F17210 Nicotine dependence, cigarettes, uncomplicated: Secondary | ICD-10-CM | POA: Diagnosis not present

## 2022-03-12 DIAGNOSIS — C50411 Malignant neoplasm of upper-outer quadrant of right female breast: Secondary | ICD-10-CM

## 2022-03-12 DIAGNOSIS — M79621 Pain in right upper arm: Secondary | ICD-10-CM

## 2022-03-12 DIAGNOSIS — Z79899 Other long term (current) drug therapy: Secondary | ICD-10-CM | POA: Diagnosis not present

## 2022-03-12 LAB — CBC WITH DIFFERENTIAL (CANCER CENTER ONLY)
Abs Immature Granulocytes: 0.01 10*3/uL (ref 0.00–0.07)
Basophils Absolute: 0 10*3/uL (ref 0.0–0.1)
Basophils Relative: 1 %
Eosinophils Absolute: 0.3 10*3/uL (ref 0.0–0.5)
Eosinophils Relative: 4 %
HCT: 39.7 % (ref 36.0–46.0)
Hemoglobin: 13.4 g/dL (ref 12.0–15.0)
Immature Granulocytes: 0 %
Lymphocytes Relative: 42 %
Lymphs Abs: 3.2 10*3/uL (ref 0.7–4.0)
MCH: 28.5 pg (ref 26.0–34.0)
MCHC: 33.8 g/dL (ref 30.0–36.0)
MCV: 84.3 fL (ref 80.0–100.0)
Monocytes Absolute: 0.8 10*3/uL (ref 0.1–1.0)
Monocytes Relative: 11 %
Neutro Abs: 3.1 10*3/uL (ref 1.7–7.7)
Neutrophils Relative %: 42 %
Platelet Count: 283 10*3/uL (ref 150–400)
RBC: 4.71 MIL/uL (ref 3.87–5.11)
RDW: 15.3 % (ref 11.5–15.5)
WBC Count: 7.4 10*3/uL (ref 4.0–10.5)
nRBC: 0 % (ref 0.0–0.2)

## 2022-03-12 LAB — CMP (CANCER CENTER ONLY)
ALT: 10 U/L (ref 0–44)
AST: 11 U/L — ABNORMAL LOW (ref 15–41)
Albumin: 3.7 g/dL (ref 3.5–5.0)
Alkaline Phosphatase: 49 U/L (ref 38–126)
Anion gap: 3 — ABNORMAL LOW (ref 5–15)
BUN: 13 mg/dL (ref 8–23)
CO2: 29 mmol/L (ref 22–32)
Calcium: 9.8 mg/dL (ref 8.9–10.3)
Chloride: 106 mmol/L (ref 98–111)
Creatinine: 0.72 mg/dL (ref 0.44–1.00)
GFR, Estimated: >60 mL/min (ref >60)
Glucose, Bld: 98 mg/dL (ref 70–99)
Potassium: 3.7 mmol/L (ref 3.5–5.1)
Sodium: 138 mmol/L (ref 135–145)
Total Bilirubin: 0.3 mg/dL (ref 0.3–1.2)
Total Protein: 8.3 g/dL — ABNORMAL HIGH (ref 6.5–8.1)

## 2022-03-13 ENCOUNTER — Telehealth: Payer: Self-pay | Admitting: Hematology and Oncology

## 2022-03-13 LAB — KAPPA/LAMBDA LIGHT CHAINS
Kappa free light chain: 18.1 mg/L (ref 3.3–19.4)
Kappa, lambda light chain ratio: 0.02 — ABNORMAL LOW (ref 0.26–1.65)
Lambda free light chains: 754.7 mg/L — ABNORMAL HIGH (ref 5.7–26.3)

## 2022-03-13 MED ORDER — OYSTER SHELL CALCIUM/D3 500-5 MG-MCG PO TABS: 1.0000 | ORAL_TABLET | Freq: Every day | ORAL | 6 refills | Status: AC

## 2022-03-13 NOTE — Progress Notes (Signed)
[place] [phone]   [phone]  PROGRESS NOTE  Patient Care Team: Leeroy Cha, MD as PCP - General (Internal Medicine) Lahoma Crocker, MD as Consulting Physician (Obstetrics and Gynecology)  Hematological/Oncological History #Right breast cancer -03/03/2013: Right breast biopsy: T3 N1, stage IIIA invasive ductal carcinoma, grade 3, estrogen receptor 100% positive, progesterone receptor 100% positive, with an MIB-1 of 20% and no HER-2 amplification -03/04/2014: Right axillary lymph node biopsy was negatibe -08/23/2013: Received 6 cycles of neoadjuvant chemotherapy with docetaxel, doxorubicin and cyclophosphamide. -10/25/2013: Underwent right total mastectomy and right axillary sentinel node biopsy. Pathology revealed ypT3 ypN0 invasive lobular carcinoma, again estrogen and progesterone receptor positive and HER-2 nonamplified; with negative margins -12/08/2013-01/17/2014: Adjuvant radiation to right chest wall. 50.4 Gy in 28 fractions.  -03/01/2014: Started tamoxifen with plans to continue for 10 years.   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 Immunofixation showed IgG monocloncal protein with lamda light chain specificity. Lambda free light chains elevated at 641.1 with kappa/lamda ratio at 0.03.  -04/22/2021:Kappa free light chains elevated at 21.5 mg/L, Lambda free light chains elevated at 675.6 mg/L with kappa/lamda ratio at 0.03.  -05/21/2021: Establish care with Dede Query PA-C --Bone survey done on May 24, 2021 showed no evidence of lytic lesions.  Moderate multifocal degenerative changes --Bone marrow aspiration and biopsy done on July 02, 2021 showed normocellular bone marrow for age with plasmacytosis plasma cells 13% plasma cells apparently display lambda light chain excess and very limited staining most suggestive of plasma cell neoplasm  especially in presence of monoclonal protein.  Cytogenetics normal, FISH normal  CHIEF COMPLAINTS/PURPOSE OF CONSULTATION:  History of right breast cancer Smoldering Multiple Myeloma  HISTORY OF PRESENTING ILLNESS:  Denise Becker 70 y.o. female to the clinic for a follow up for smoldering multiple myeloma and history of right breast cancer. She was last seen by Dr. Chryl Heck on 07/007/2023.  Patient is unaccompanied for this visit.   [name] reports she is tolerating tamoxifen without any prohibitive toxicities. Her energy levels are stable and she is able to complete all her ADLs on her own. Her appetite and weight are stable. She reports some tightness and discomfort in the right axillary region without any palpable masses for the last two months. She has not breast changes. She denies nausea, vomiting or abdominal pain. Her bowel habits are unchanged. She denies fevers, chills, night sweats, shortness of breath, chest pain or cough. She has no other complaints.   Rest of the pertinent 10 point ROS reviewed and negative  MEDICAL HISTORY:  Past Medical History:  Diagnosis Date   Anxiety    anxiousabout impending surgery- loss of breast   Arthritis    "joints hurt q now and then" (10/25/2013)   Breast cancer ([place])    Breast cancer of upper-outer quadrant of right female breast ([place]) 03/10/2013   Depression    history of depression after son died in the 2000's   GERD (gastroesophageal reflux disease)    "just chemo related"   H/O hiatal hernia    Hypertension    Radiation 12/08/13-01/17/14   Right chest wall 50.4 Gy   Spontaneous dislocation of shoulder [phone]   "right; a few times over the years"    SURGICAL HISTORY: Past Surgical History:  Procedure Laterality Date   ABDOMINAL HYSTERECTOMY  1990's   BREAST BIOPSY Right 02/2013   CARDIOVERSION  11/26/2021   Procedure: CARDIOVERSION;  Surgeon: Phineas Inches  E, MD;  Location: [facility];  Service: Cardiovascular;;    [place] / SIMPLE W/ SENTINEL NODE BIOPSY Right 10/25/2013   MASTECTOMY W/ SENTINEL NODE BIOPSY Right 10/25/2013   Procedure: RIGHT TOTAL MASTECTOMY WITH SENTINEL LYMPH NODE BIOPSY;  Surgeon: Adin Hector, MD;  Location: [facility];  Service: General;  Laterality: Right;   MULTIPLE TOOTH EXTRACTIONS Bilateral 2011-2014 X 6   PORT A CATH REVISION Right 04/29/2013   Procedure: PORT A CATH REVISION;  Surgeon: Adin Hector, MD;  Location: [facility];  Service: General;  Laterality: Right;   PORT-A-CATH REMOVAL  10/25/2013   PORT-A-CATH REMOVAL N/A 10/25/2013   Procedure: REMOVAL PORT-A-CATH;  Surgeon: Adin Hector, MD;  Location: [facility];  Service: General;  Laterality: N/A;   PORTACATH PLACEMENT Right 04/15/2013   Procedure: INSERTION PORT-A-CATH;  Surgeon: Adin Hector, MD;  Location: [facility];  Service: General;  Laterality: Right;   TEE WITHOUT CARDIOVERSION N/A 11/26/2021   Procedure: TRANSESOPHAGEAL ECHOCARDIOGRAM (TEE);  Surgeon: Janina [place], MD;  Location: [facility] ENDOSCOPY;  Service: Cardiovascular;  Laterality: N/A;   TONSILLECTOMY     WISDOM TOOTH EXTRACTION      SOCIAL HISTORY: Social History   Socioeconomic History   Marital status: Single    Spouse name: Not on file   Number of children: 1   Years of education: Not on file   Highest education level: Not on file  Occupational History   Not on file  Tobacco Use   Smoking status: Former    Packs/day: 0.25    Years: 3.00    Total pack years: 0.75    Types: Cigarettes    Quit date: 04/12/2001    Years since quitting: 20.9   Smokeless tobacco: Never   Tobacco comments:    "casual smoker; pack would go stale on me"  Vaping Use   Vaping Use: Never used  Substance and Sexual Activity   Alcohol use: Yes    Comment: 10/25/2013 "might have a glass of wine a few times/year"   Drug use: No   Sexual activity: Never    Birth control/protection: Surgical  Other  Topics Concern   Not on file  Social History Narrative   Not on file   Social Determinants of Health   Financial Resource Strain: Not on file  Food Insecurity: Not on file  Transportation Needs: Not on file  Physical Activity: Not on file  Stress: Not on file  Social Connections: Not on file  Intimate Partner Violence: Not on file    FAMILY HISTORY: Family History  Problem Relation Age of Onset   Hypertension Mother    Bowel Disease Mother    Lung disease Father    Cancer Father    Heart disease Maternal Grandmother    Heart disease Maternal Grandfather    Heart disease Maternal Uncle    Heart attack Maternal Uncle     ALLERGIES:  is allergic to effexor [venlafaxine], ciprofloxacin, and penicillins.  MEDICATIONS:  Current Outpatient Medications  Medication Sig Dispense Refill   amLODipine (NORVASC) 10 MG tablet Take 10 mg by mouth daily.     apixaban (ELIQUIS) 5 MG TABS tablet Take 1 tablet (5 mg total) by mouth 2 (two) times daily. 60 tablet 3   losartan (COZAAR) 100 MG tablet Take 1 tablet (100 mg total) by mouth daily. 7 tablet 0   metoprolol tartrate (LOPRESSOR) 25 MG tablet Take 1 tablet (25  mg total) by mouth 2 (two) times daily. 90 tablet 1   Multiple Vitamins-Minerals (MULTIVITAMIN ADULT) CHEW Chew 2 tablets by mouth 2 (two) times a week.     omeprazole (PRILOSEC) 20 MG capsule Take 1 capsule (20 mg total) by mouth daily before breakfast. 30 mins before food ideally (Patient taking differently: Take 20 mg by mouth daily as needed (indigestion).) 90 capsule 0   tamoxifen (NOLVADEX) 20 MG tablet Take 1 tablet (20 mg total) by mouth daily. 90 tablet 4   tiZANidine (ZANAFLEX) 4 MG tablet Take 4 mg by mouth 3 (three) times daily.     calcium-vitamin D (OSCAL WITH D) 500-5 MG-MCG tablet Take 1 tablet by mouth daily with breakfast. 30 tablet 6   No current facility-administered medications for this visit.    REVIEW OF SYSTEMS:   Constitutional: ( - ) fevers, ( - )   chills , ( - ) night sweats Eyes: ( - ) blurriness of vision, ( - ) double vision, ( - ) watery eyes Ears, nose, mouth, throat, and face: ( - ) mucositis, ( - ) sore throat Respiratory: ( - ) cough, ( - ) dyspnea, ( - ) wheezes Cardiovascular: ( - ) palpitation, ( - ) chest discomfort, ( - ) lower extremity swelling Gastrointestinal:  ( - ) nausea, ( - ) heartburn, ( - ) change in bowel habits Skin: ( - ) abnormal skin rashes Lymphatics: ( - ) new lymphadenopathy, ( - ) easy bruising Neurological: ( - ) numbness, ( - ) tingling, ( - ) new weaknesses Behavioral/Psych: ( - ) mood change, ( - ) new changes  All other systems were reviewed with the patient and are negative.  PHYSICAL EXAMINATION: ECOG PERFORMANCE STATUS: 1 - Symptomatic but completely ambulatory  Vitals:   03/12/22 1035  BP: (!) 149/69  Pulse: (!) 123  Resp: 18  Temp: (!) 97.5 F (36.4 C)  SpO2: 98%    Filed Weights   03/12/22 1035  Weight: 203 lb 1.6 oz (92.1 kg)    Constitutional: Oriented to person, place, and time and well-developed, well-nourished, and in no distress.  HENT:  Head: Normocephalic and atraumatic.    Cardiovascular: Normal rate, regular rhythm, normal heart sounds and intact distal pulses.   Pulmonary/Chest: Effort normal and breath sounds normal. No respiratory distress. No wheezes. No rales.  Musculoskeletal: Normal range of motion. Exhibits no edema.  Lymphadenopathy: No cervical adenopathy.  Neurological: Alert and oriented to person, place, and time. Exhibits normal muscle tone. Gait normal. Coordination normal.  Skin: Skin is warm and dry. No rash noted. Not diaphoretic. No erythema. No pallor.  Psychiatric: Mood, memory and judgment normal.  Breast: Status post right mastectomy. No palpable mass in right axillary region. Left breast and left axillary region was examined without any palpable masses and changes.    LABORATORY DATA:  I have reviewed the data as listed    Latest Ref Rng  & Units 03/12/2022    9:56 AM 11/26/2021    5:02 AM 11/25/2021    3:45 AM  CBC  WBC 4.0 - 10.5 K/uL 7.4  11.8  13.1   Hemoglobin 12.0 - 15.0 g/dL 13.4  13.1  12.7   Hematocrit 36.0 - 46.0 % 39.7  40.1  38.3   Platelets 150 - 400 K/uL 283  248  220        Latest Ref Rng & Units 03/12/2022    9:56 AM 11/25/2021    3:45 AM 11/24/2021  4:16 AM  CMP  Glucose 70 - 99 mg/dL 98  103  103   BUN 8 - 23 mg/dL _0 Creatinine 0.44 - 1.00 mg/dL 0.72  0.78  0.77   Sodium 135 - 145 mmol/L 138  139  140   Potassium 3.5 - 5.1 mmol/L 3.7  3.5  3.5   Chloride 98 - 111 mmol/L 106  104  109   CO2 22 - 32 mmol/L _1 Calcium 8.9 - 10.3 mg/dL 9.8  9.1  8.6   Total Protein 6.5 - 8.1 g/dL 8.3   7.5   Total Bilirubin 0.3 - 1.2 mg/dL 0.3   0.4   Alkaline Phos 38 - 126 U/L 49   89   AST 15 - 41 U/L 11   25   ALT 0 - 44 U/L 10   28    RADIOGRAPHIC STUDIES: I have personally reviewed the radiological images as listed and agreed with the findings in the report. MM 3D SCREEN BREAST UNI LEFT  Result Date: 02/24/2022 CLINICAL DATA:  Screening. EXAM: DIGITAL SCREENING UNILATERAL LEFT MAMMOGRAM WITH CAD AND TOMOSYNTHESIS TECHNIQUE: Left screening digital craniocaudal and mediolateral oblique mammograms were obtained. Left screening digital breast tomosynthesis was performed. The images were evaluated with computer-aided detection. COMPARISON:  Previous exam(s). ACR Breast Density Category c: The breast tissue is heterogeneously dense, which may obscure small masses. FINDINGS: The patient has had a right mastectomy. There are no findings suspicious for malignancy. IMPRESSION: No mammographic evidence of malignancy. A result letter of this screening mammogram will be mailed directly to the patient. RECOMMENDATION: Screening mammogram in one year.  (Code:SM-L-74M) BI-RADS CATEGORY  1: Negative. Electronically Signed   By: Kristopher Oppenheim M.D.   On: 02/24/2022 13:33    ASSESSMENT & PLAN KATALEENA HOLSAPPLE is a 71 y.o. who presents to the clinic for further evaluation for history of right breast cancer and smoldering multiple myeloma.   #Smoldering multiple myeloma: --Bone marrow biopsy from 07/02/2021 showed 13% plasma cells consistent with smoldering multiple myeloma. No evidence of end-organ damage --Most recent labs from 10/04/2021 revealed stable M protein measuring 2.1 g/dL. Lambda 661.6, Kappa 22.3, Ratio 0.03.  --Bone met survey from 05/24/2021 was negative for lytic lesions. Need to repeat around February 2024. --Need 24 hour UPEP done now --Labs today show no evidence of cytopenias. Creatinine 0.72, calcium 9.8. Pending SPEP/IFE and sFLC today.  --Continue to monitor  # H/O Right breast cancer --Currently on tamoxifen that she started on 03/01/2014 with plans to continue for 10 years.  --Most recent screening mammogram was on 02/19/2022 with no evidence of malignancy. Next one due November 2024. --Bone density from 11/05/2021 was consistent with osteopenic/low bone mass. Recommend vitamin D/calcium supplementation.  --Dr. Chryl Heck recommends Zometa every 6 months x 3 years once we obtain dental clearance. Patient is undergoing extensive dental work at this time. She would like to wait after dental work is complete to start Zometa infusions. We will follow up at next visit.   #Right axillary tightness/discomfort: --Present for the last two weeks --Suspect most surgical changes but will obtain Axillary [name] for further evaluation  Follow up: --Labs and f/u with Dr. Chryl Heck in 6 months  No orders of the defined types were placed in this encounter.   All questions were answered. The patient knows to call the clinic with any problems, questions or concerns.  I have spent a total of 30  minutes minutes of face-to-face and non-face-to-face time, preparing to see the patient, obtaining and/or reviewing separately obtained history, performing a medically appropriate examination, counseling and  educating the patient, ordering tests/procedures, documenting clinical information in the electronic health record,  and care coordination.   Dede Query PA-C Dept of Hematology and [place] at [facility] Phone: [phone]

## 2022-03-13 NOTE — Telephone Encounter (Signed)
Called patient to r/s January appointments. Patient notified of updated appointment information

## 2022-03-18 ENCOUNTER — Ambulatory Visit: Payer: Medicare HMO | Admitting: Cardiology

## 2022-03-18 LAB — MULTIPLE MYELOMA PANEL, SERUM
Albumin SerPl Elph-Mcnc: 3.6 g/dL (ref 2.9–4.4)
Albumin/Glob SerPl: 0.8 (ref 0.7–1.7)
Alpha 1: 0.3 g/dL (ref 0.0–0.4)
Alpha2 Glob SerPl Elph-Mcnc: 0.8 g/dL (ref 0.4–1.0)
B-Globulin SerPl Elph-Mcnc: 1.1 g/dL (ref 0.7–1.3)
Gamma Glob SerPl Elph-Mcnc: 2.6 g/dL — ABNORMAL HIGH (ref 0.4–1.8)
Globulin, Total: 4.7 g/dL — ABNORMAL HIGH (ref 2.2–3.9)
IgA: 164 mg/dL (ref 64–422)
IgG (Immunoglobin G), Serum: 3174 mg/dL — ABNORMAL HIGH (ref 586–1602)
IgM (Immunoglobulin M), Srm: 76 mg/dL (ref 26–217)
M Protein SerPl Elph-Mcnc: 2.4 g/dL — ABNORMAL HIGH
Total Protein ELP: 8.3 g/dL (ref 6.0–8.5)

## 2022-03-25 DIAGNOSIS — M542 Cervicalgia: Secondary | ICD-10-CM | POA: Diagnosis not present

## 2022-03-25 DIAGNOSIS — M5451 Vertebrogenic low back pain: Secondary | ICD-10-CM | POA: Diagnosis not present

## 2022-04-03 DIAGNOSIS — M5451 Vertebrogenic low back pain: Secondary | ICD-10-CM | POA: Diagnosis not present

## 2022-04-03 DIAGNOSIS — M542 Cervicalgia: Secondary | ICD-10-CM | POA: Diagnosis not present

## 2022-04-11 ENCOUNTER — Ambulatory Visit: Payer: Medicare HMO | Admitting: Hematology and Oncology

## 2022-04-11 ENCOUNTER — Other Ambulatory Visit: Payer: Medicare HMO

## 2022-04-15 DIAGNOSIS — M542 Cervicalgia: Secondary | ICD-10-CM | POA: Diagnosis not present

## 2022-04-15 DIAGNOSIS — M5451 Vertebrogenic low back pain: Secondary | ICD-10-CM | POA: Diagnosis not present

## 2022-04-22 ENCOUNTER — Other Ambulatory Visit: Payer: Medicare HMO

## 2022-04-22 DIAGNOSIS — F411 Generalized anxiety disorder: Secondary | ICD-10-CM | POA: Diagnosis not present

## 2022-04-25 ENCOUNTER — Ambulatory Visit: Payer: Medicare HMO | Admitting: Cardiology

## 2022-04-25 ENCOUNTER — Encounter: Payer: Self-pay | Admitting: Cardiology

## 2022-04-25 VITALS — BP 187/99 | HR 80 | Resp 16 | Ht 61.0 in | Wt 208.6 lb

## 2022-04-25 DIAGNOSIS — Z7901 Long term (current) use of anticoagulants: Secondary | ICD-10-CM

## 2022-04-25 DIAGNOSIS — I10 Essential (primary) hypertension: Secondary | ICD-10-CM

## 2022-04-25 DIAGNOSIS — I480 Paroxysmal atrial fibrillation: Secondary | ICD-10-CM

## 2022-04-25 DIAGNOSIS — D472 Monoclonal gammopathy: Secondary | ICD-10-CM | POA: Diagnosis not present

## 2022-04-25 MED ORDER — LOSARTAN POTASSIUM 100 MG PO TABS: 100.0000 mg | ORAL_TABLET | Freq: Every day | ORAL | 0 refills | Status: DC

## 2022-04-25 MED ORDER — AMLODIPINE BESYLATE 10 MG PO TABS: 10.0000 mg | ORAL_TABLET | Freq: Every day | ORAL | 0 refills | Status: DC

## 2022-04-25 NOTE — Progress Notes (Unsigned)
ID:  Denise Becker, DOB 1951/03/28, MRN [id number]  PCP:  Leeroy Cha, MD  Cardiologist:  Rex Kras, DO, [facility] (established care 09/23/2019)  Date: 04/25/22 Last Office Visit: 12/16/2021  Chief Complaint  Patient presents with   Atrial Fibrillation   Follow-up    3 months    HPI  Denise Becker is a 72 y.o. female whose past medical history and cardiovascular risk factors include: History of breast cancer status post mastectomy/radiation/chemotherapy, Smoldering Multiple Myeloma, hypertension, postmenopausal female, advanced age, obesity due to excess calories.  In August 2023 she was diagnosed with bacteremia and was in A-fib with RVR requiring TEE guided cardioversion.  And thereafter was started on AV nodal blocking agents as well as anticoagulation for thromboembolic prophylaxis.  She presents today for 26-monthfollow-up visit.  Patient doing well from a cardiovascular standpoint since last office visit.  Since she was diagnosed with atrial fibrillation during her episode of bacteremia at the last office visit we discussed undergoing loop recorder implantation for long-term monitoring of A-fib and if she has recurrence we could consider anticoagulation long-term.  Patient is inclined to undergo loop recorder implantation.  However, would like to hold off given the death of a close friend/pastor.  Clinically denies anginal discomfort or heart failure symptoms.   FUNCTIONAL STATUS: No structured exercise program or daily routine.    ALLERGIES: Allergies  Allergen Reactions   Effexor [Venlafaxine] Other (See Comments)    Made her nervous   Ciprofloxacin Diarrhea    Patient developed severe diarrhea on cipro   Penicillins Rash    Did it involve swelling of the face/tongue/throat, SOB, or low BP? No Did it involve sudden or severe rash/hives, skin peeling, or any reaction on the inside of your mouth or nose? Yes Did you need to seek medical attention at a  hospital or doctor's office? No When did it last happen? Several Years Ago       If all above answers are "NO", may proceed with cephalosporin use.      MEDICATION LIST PRIOR TO VISIT: Current Meds  Medication Sig   apixaban (ELIQUIS) 5 MG TABS tablet Take 1 tablet (5 mg total) by mouth 2 (two) times daily.   calcium-vitamin D (OSCAL WITH D) 500-5 MG-MCG tablet Take 1 tablet by mouth daily with breakfast.   metoprolol tartrate (LOPRESSOR) 25 MG tablet Take 1 tablet (25 mg total) by mouth 2 (two) times daily.   Multiple Vitamins-Minerals (MULTIVITAMIN ADULT) CHEW Chew 2 tablets by mouth 2 (two) times a week.   omeprazole (PRILOSEC) 20 MG capsule Take 1 capsule (20 mg total) by mouth daily before breakfast. 30 mins before food ideally (Patient taking differently: Take 20 mg by mouth daily as needed (indigestion).)   tamoxifen (NOLVADEX) 20 MG tablet Take 1 tablet (20 mg total) by mouth daily.   [DISCONTINUED] amLODipine (NORVASC) 10 MG tablet Take 10 mg by mouth daily.   [DISCONTINUED] losartan (COZAAR) 100 MG tablet Take 1 tablet (100 mg total) by mouth daily.     PAST MEDICAL HISTORY: Past Medical History:  Diagnosis Date   Anxiety    anxiousabout impending surgery- loss of breast   Arthritis    "joints hurt q now and then" (10/25/2013)   [place]    [place]   Depression    history of depression after son died in the 2000's   GERD (gastroesophageal reflux disease)    "just  chemo related"   H/O hiatal hernia    Hypertension    Radiation 12/08/13-01/17/14   Right chest wall 50.4 Gy   Spontaneous dislocation of shoulder [phone]   "right; a few times over the years"    PAST SURGICAL HISTORY: Past Surgical History:  Procedure Laterality Date   ABDOMINAL HYSTERECTOMY  1990's   BREAST BIOPSY Right 02/2013   CARDIOVERSION  11/26/2021   Procedure: CARDIOVERSION;  Surgeon: Janina Mayo, MD;   Location: [facility];  Service: Cardiovascular;;   [place] / SIMPLE W/ SENTINEL NODE BIOPSY Right 10/25/2013   MASTECTOMY W/ SENTINEL NODE BIOPSY Right 10/25/2013   Procedure: RIGHT TOTAL MASTECTOMY WITH SENTINEL LYMPH NODE BIOPSY;  Surgeon: Adin Hector, MD;  Location: [facility];  Service: General;  Laterality: Right;   MULTIPLE TOOTH EXTRACTIONS Bilateral 2011-2014 X 6   PORT A CATH REVISION Right 04/29/2013   Procedure: PORT A CATH REVISION;  Surgeon: Adin Hector, MD;  Location: [facility];  Service: General;  Laterality: Right;   PORT-A-CATH REMOVAL  10/25/2013   PORT-A-CATH REMOVAL N/A 10/25/2013   Procedure: REMOVAL PORT-A-CATH;  Surgeon: Adin Hector, MD;  Location: [facility];  Service: General;  Laterality: N/A;   PORTACATH PLACEMENT Right 04/15/2013   Procedure: INSERTION PORT-A-CATH;  Surgeon: Adin Hector, MD;  Location: [facility];  Service: General;  Laterality: Right;   TEE WITHOUT CARDIOVERSION N/A 11/26/2021   Procedure: TRANSESOPHAGEAL ECHOCARDIOGRAM (TEE);  Surgeon: Janina Mayo, MD;  Location[facility] ENDOSCOPY;  Service: Cardiovascular;  Laterality: N/A;   TONSILLECTOMY     WISDOM TOOTH EXTRACTION      FAMILY HISTORY: The patient family history includes Bowel Disease in her mother; Cancer in her father; Heart attack in her maternal uncle; Heart disease in her maternal grandfather, maternal grandmother, and maternal uncle; Hypertension in her mother; Lung disease in her father.  SOCIAL HISTORY:  The patient  reports that she quit smoking about 21 years ago. Her smoking use included cigarettes. She has a 0.75 pack-year smoking history. She has never used smokeless tobacco. She reports current alcohol use. She reports that she does not use drugs.  REVIEW OF SYSTEMS: Review of Systems  Cardiovascular:  Negative for chest pain, claudication, dyspnea on exertion, irregular heartbeat, leg swelling,  near-syncope, orthopnea, palpitations, paroxysmal nocturnal dyspnea and syncope.  Respiratory:  Negative for shortness of breath.   Hematologic/Lymphatic: Negative for bleeding problem.  Musculoskeletal:  Negative for muscle cramps and myalgias.  Neurological:  Negative for dizziness and light-headedness.    PHYSICAL EXAM:    04/25/2022   10:08 AM 04/25/2022    9:57 AM 03/12/2022   10:35 AM  Vitals with BMI  Height  '5\' 1"'$  '5\' 1"'$   Weight  208 lbs 10 oz 203 lbs 2 oz  BMI  27.06 23.7  Systolic 628 315 176  Diastolic 99 88 69  Pulse 80 84 123    Physical Exam  Constitutional: No distress.  Age appropriate, hemodynamically stable, ambulates with a cane.  Neck: No JVD present.  Cardiovascular: Normal rate, regular rhythm, S1 normal, S2 normal, intact distal pulses and normal pulses. Exam reveals no gallop, no S3 and no S4.  No murmur heard. Pulmonary/Chest: Effort normal and breath sounds normal. No stridor. She has no wheezes. She has no rales.  History of right mastectomy  Abdominal: Soft. Bowel sounds are normal. She exhibits no distension. There is no abdominal tenderness.  Musculoskeletal:  General: No edema.     Cervical back: Neck supple.  Neurological: She is alert and oriented to person, place, and time. She has intact cranial nerves (2-12).  Skin: Skin is warm and moist.   CARDIAC DATABASE: EKG: 04/25/2022: Sinus rhythm, 78 bpm, without underlying injury pattern.  No significant change compared to prior EKG 12/16/2021  Echocardiogram: 03/29/2013: LVEF 55-60%, normal wall motion, no regional wall motion abnormalities, average global longitudinal strain -18.6%.    09/29/2019: LVEF 55%, Mild LVH, Grade II diastolic dysfunction, mildly LAE.   11/26/2021 transesophageal echocardiograms plus direct-current cardioversion Dr. Phineas Inches:  1. Left ventricular ejection fraction, by estimation, is 60 to 65%. The left ventricle has normal function.   2. Right ventricular  systolic function is normal. The right ventricular size is normal.   3. No left atrial/left atrial appendage thrombus was detected. The LAA  emptying velocity was 48 cm/s.   4. The mitral valve is normal in structure. No evidence of mitral valve regurgitation.   5. The aortic valve is normal in structure. Aortic valve regurgitation is not visualized.   Conclusion(s)/Recommendation(s): No evidence of vegetation/infective endocarditis on this transesophageael echocardiogram. No LA/LAA thrombus identified. Negative bubble study for interatrial shunt. No intracardiac source of embolism detected on this on  this transesophageal echocardiogram.   Stress Testing: None  Heart Catheterization: None  Lower Extremity Venous Duplex  09/29/2019:  No evidence of deep vein thrombosis of the lower extremities with normal venous return.   LABORATORY DATA:    Latest Ref Rng & Units 03/12/2022    9:56 AM 11/26/2021    5:02 AM 11/25/2021    3:45 AM  CBC  WBC 4.0 - 10.5 K/uL 7.4  11.8  13.1   Hemoglobin 12.0 - 15.0 g/dL 13.4  13.1  12.7   Hematocrit 36.0 - 46.0 % 39.7  40.1  38.3   Platelets 150 - 400 K/uL 283  248  220    BMP Recent Labs    11/24/21 0416 11/25/21 0345 03/12/22 0956  NA 140 139 138  K 3.5 3.5 3.7  CL 109 104 106  CO2 '25 28 29  '$ GLUCOSE 103* 103* 98  BUN '12 11 13  '$ CREATININE 0.77 0.78 0.72  CALCIUM 8.6* 9.1 9.8  GFRNONAA >60 >60 >60   External Labs: Collected: 08/23/2019 Creatinine 0.87 mg/dL. eGFR: 79 mL/min per 1.73 m Lipid profile: Total cholesterol 183, triglycerides 62, HDL 56, LDL 113, non-HDL 127 TSH: 2.46   IMPRESSION:    ICD-10-CM   1. Paroxysmal atrial fibrillation (HCC)  I48.0 EKG 12-Lead    ECHOCARDIOGRAM COMPLETE    2. Long term (current) use of anticoagulants  Z79.01     3. Essential hypertension, benign  I10 ECHOCARDIOGRAM COMPLETE    amLODipine (NORVASC) 10 MG tablet    losartan (COZAAR) 100 MG tablet    4. 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 female whose past medical history and cardiac risk factors include: History of breast cancer status post mastectomy/radiation/chemotherapy, Smoldering Multiple Myeloma, hypertension, postmenopausal female, advanced age, obesity due to excess calories.  Paroxysmal atrial fibrillation (HCC) Rate control: Lopressor. Rhythm control: N/A. Thromboembolic prophylaxis: Eliquis CHA2DS2-VASc SCORE is 3 which correlates to 3.2 % risk of stroke per year (hypertension, age, gender).  Diagnosed with atrial fibrillation during her episode of bacteremia. Since her bacteremia she has been anticoagulated for at least 3 months. We discussed long-term anticoagulation versus considering  loop recorder. If she were to proceed with loop recorder implant I would continue anticoagulation for the first 90 days as long as there is no atrial fibrillation detected we could safely discontinue oral anticoagulation and continue monitoring for A-fib.  If she is noted to have recurrence of A-fib then anticoagulation will need to be rediscussed.  Patient is agreeable with the plan of care and will call us back with her decision.    Long term (current) use of anticoagulants Indication: Paroxysmal atrial fibrillation status post cardioversion Discussed the risks, benefits, and alternatives to oral anticoagulation. Labs from December 2023 reviewed hemoglobin is within acceptable limits. Does not endorse evidence of bleeding.  Essential hypertension, benign Office blood pressures are not well-controlled Ran out of home medications - for that last three days  Patient states that the medication should be arriving later today.  As a safety precaution we will refill her medications at a local pharmacy just in case her meds do not arrive today and she can potentially have it filled at a local pharmacy. Reemphasized importance of low-salt  diet  Independently reviewed labs from 03/12/2022. She currently follows with hematology given her history of smoldering multiple myeloma.  Protein electrophoresis does show M spike.  Proceed with an echocardiogram to evaluate for LVEF, structural heart disease, and strain imaging.  Cannot entirely rule out amyloid.  Will have to reach out to her hematology provider as well to help aid/workup.  FINAL MEDICATION LIST END OF ENCOUNTER: Meds ordered this encounter  Medications   amLODipine (NORVASC) 10 MG tablet    Sig: Take 1 tablet (10 mg total) by mouth daily.    Dispense:  30 tablet    Refill:  0   losartan (COZAAR) 100 MG tablet    Sig: Take 1 tablet (100 mg total) by mouth daily.    Dispense:  30 tablet    Refill:  0    Medications Discontinued During This Encounter  Medication Reason   tiZANidine (ZANAFLEX) 4 MG tablet    losartan (COZAAR) 100 MG tablet Reorder   amLODipine (NORVASC) 10 MG tablet Reorder     Current Outpatient Medications:    apixaban (ELIQUIS) 5 MG TABS tablet, Take 1 tablet (5 mg total) by mouth 2 (two) times daily., Disp: 60 tablet, Rfl: 3   calcium-vitamin D (OSCAL WITH D) 500-5 MG-MCG tablet, Take 1 tablet by mouth daily with breakfast., Disp: 30 tablet, Rfl: 6   metoprolol tartrate (LOPRESSOR) 25 MG tablet, Take 1 tablet (25 mg total) by mouth 2 (two) times daily., Disp: 90 tablet, Rfl: 1   Multiple Vitamins-Minerals (MULTIVITAMIN ADULT) CHEW, Chew 2 tablets by mouth 2 (two) times a week., Disp: , Rfl:    omeprazole (PRILOSEC) 20 MG capsule, Take 1 capsule (20 mg total) by mouth daily before breakfast. 30 mins before food ideally (Patient taking differently: Take 20 mg by mouth daily as needed (indigestion).), Disp: 90 capsule, Rfl: 0   tamoxifen (NOLVADEX) 20 MG tablet, Take 1 tablet (20 mg total) by mouth daily., Disp: 90 tablet, Rfl: 4   amLODipine (NORVASC) 10 MG tablet, Take 1 tablet (10 mg total) by mouth daily., Disp: 30 tablet, Rfl: 0   losartan  (COZAAR) 100 MG tablet, Take 1 tablet (100 mg total) by mouth daily., Disp: 30 tablet, Rfl: 0  Orders Placed This Encounter  Procedures   EKG 12-Lead   ECHOCARDIOGRAM COMPLETE    There are no Patient Instructions on file for this visit.  --Continue cardiac medications  as reconciled in final medication list. --Return in about 4 weeks (around 05/23/2022) for Atrial fibrillation management i.e. consider loop recorder implant and discuss test result. Or sooner if needed. --Continue follow-up with your primary care physician regarding the management of your other chronic comorbid conditions.  Patient's questions and concerns were addressed to her satisfaction. She voices understanding of the instructions provided during this encounter.   This note was created using a voice recognition software as a result there may be grammatical errors inadvertently enclosed that do not reflect the nature of this encounter. Every attempt is made to correct such errors.  Rex Kras, [place], [facility]  Pager: [phone] Office: [phone]

## 2022-04-27 DIAGNOSIS — F411 Generalized anxiety disorder: Secondary | ICD-10-CM | POA: Diagnosis not present

## 2022-04-29 ENCOUNTER — Ambulatory Visit
Admission: RE | Admit: 2022-04-29 | Discharge: 2022-04-29 | Disposition: A | Discharge status: Home / Self Care | Payer: Medicare HMO | Source: Ambulatory Visit | Attending: Physician Assistant | Admitting: Physician Assistant

## 2022-04-29 DIAGNOSIS — Z0389 Encounter for observation for other suspected diseases and conditions ruled out: Secondary | ICD-10-CM | POA: Diagnosis not present

## 2022-04-29 DIAGNOSIS — M542 Cervicalgia: Secondary | ICD-10-CM | POA: Diagnosis not present

## 2022-04-29 DIAGNOSIS — M5451 Vertebrogenic low back pain: Secondary | ICD-10-CM | POA: Diagnosis not present

## 2022-04-29 DIAGNOSIS — M79621 Pain in right upper arm: Secondary | ICD-10-CM

## 2022-05-01 ENCOUNTER — Telehealth: Payer: Self-pay | Admitting: Physician Assistant

## 2022-05-01 DIAGNOSIS — D472 Monoclonal gammopathy: Secondary | ICD-10-CM

## 2022-05-01 NOTE — Telephone Encounter (Signed)
I called Denise Becker to review the right axillary [name] results that was unremarkable.   I reviewed the previous labs from 03/12/2022 that did show mild increase in lambda free light chains but did not meet criteria for transformation of multiple myeloma.  We are waiting for patient to submit her 24 hour urine specimen. Additionally, she is due for her annual bone met survey this month. Pending 24 hour UPEP and bone met survey are unremarkable, we will continue with observation and plan to see her back in June 2024 for a follow up.

## 2022-05-04 DIAGNOSIS — F411 Generalized anxiety disorder: Secondary | ICD-10-CM | POA: Diagnosis not present

## 2022-05-05 ENCOUNTER — Telehealth: Payer: Self-pay

## 2022-05-05 NOTE — Telephone Encounter (Signed)
Called patient with apt for Diagnostic Bone Scan. Apt 05/09/22 @ 10 AM at [facility] radiology. Patient states that she is overwhelmed and would like to reschedule for sometime next week. Stressed the importance of getting Bone Scan and 24 hour urin completed in a timely manner. Provided number to centralized radiology scheduling for her to reschedule appointment.

## 2022-05-07 DIAGNOSIS — M542 Cervicalgia: Secondary | ICD-10-CM | POA: Diagnosis not present

## 2022-05-07 DIAGNOSIS — M5451 Vertebrogenic low back pain: Secondary | ICD-10-CM | POA: Diagnosis not present

## 2022-05-08 ENCOUNTER — Ambulatory Visit (HOSPITAL_COMMUNITY): Payer: Medicare HMO

## 2022-05-09 ENCOUNTER — Other Ambulatory Visit (HOSPITAL_COMMUNITY): Payer: Medicare HMO

## 2022-05-19 ENCOUNTER — Other Ambulatory Visit (HOSPITAL_COMMUNITY): Payer: Medicare HMO

## 2022-05-21 DIAGNOSIS — M542 Cervicalgia: Secondary | ICD-10-CM | POA: Diagnosis not present

## 2022-05-21 DIAGNOSIS — M5451 Vertebrogenic low back pain: Secondary | ICD-10-CM | POA: Diagnosis not present

## 2022-05-26 ENCOUNTER — Ambulatory Visit (HOSPITAL_COMMUNITY)
Admission: RE | Admit: 2022-05-26 | Discharge: 2022-05-26 | Disposition: A | Discharge status: Home / Self Care | Payer: Medicare HMO | Source: Ambulatory Visit | Attending: Physician Assistant | Admitting: Physician Assistant

## 2022-05-26 DIAGNOSIS — M8588 Other specified disorders of bone density and structure, other site: Secondary | ICD-10-CM | POA: Diagnosis not present

## 2022-05-26 DIAGNOSIS — D472 Monoclonal gammopathy: Secondary | ICD-10-CM | POA: Insufficient documentation

## 2022-05-29 ENCOUNTER — Ambulatory Visit: Payer: Medicare HMO | Admitting: Cardiology

## 2022-06-01 DIAGNOSIS — F411 Generalized anxiety disorder: Secondary | ICD-10-CM | POA: Diagnosis not present

## 2022-06-02 ENCOUNTER — Telehealth: Payer: Self-pay | Admitting: Physician Assistant

## 2022-06-02 NOTE — Telephone Encounter (Signed)
I called Denise Becker today to review the Bone Met Survey results from 05/26/2022. There is no evidence of lytic lesions. I encouraged patient to drop off her 24 hour UPEP that is needed. Otherwise, patient will see Dr. Chryl Heck in June 2024 for continued surveillance.

## 2022-06-04 DIAGNOSIS — I480 Paroxysmal atrial fibrillation: Secondary | ICD-10-CM | POA: Diagnosis not present

## 2022-06-04 DIAGNOSIS — D649 Anemia, unspecified: Secondary | ICD-10-CM | POA: Diagnosis not present

## 2022-06-04 DIAGNOSIS — C50911 Malignant neoplasm of unspecified site of right female breast: Secondary | ICD-10-CM | POA: Diagnosis not present

## 2022-06-04 DIAGNOSIS — I10 Essential (primary) hypertension: Secondary | ICD-10-CM | POA: Diagnosis not present

## 2022-06-04 DIAGNOSIS — D6869 Other thrombophilia: Secondary | ICD-10-CM | POA: Diagnosis not present

## 2022-06-04 DIAGNOSIS — M4692 Unspecified inflammatory spondylopathy, cervical region: Secondary | ICD-10-CM | POA: Diagnosis not present

## 2022-06-04 DIAGNOSIS — Z6841 Body Mass Index (BMI) 40.0 and over, adult: Secondary | ICD-10-CM | POA: Diagnosis not present

## 2022-06-19 ENCOUNTER — Ambulatory Visit (HOSPITAL_COMMUNITY)
Admission: RE | Admit: 2022-06-19 | Discharge: 2022-06-19 | Disposition: A | Discharge status: Home / Self Care | Payer: Medicare HMO | Source: Ambulatory Visit | Attending: Cardiology | Admitting: Cardiology

## 2022-06-19 DIAGNOSIS — I480 Paroxysmal atrial fibrillation: Secondary | ICD-10-CM | POA: Insufficient documentation

## 2022-06-19 DIAGNOSIS — D472 Monoclonal gammopathy: Secondary | ICD-10-CM | POA: Diagnosis not present

## 2022-06-19 DIAGNOSIS — I10 Essential (primary) hypertension: Secondary | ICD-10-CM | POA: Insufficient documentation

## 2022-06-19 NOTE — Progress Notes (Signed)
Echocardiogram 2D Echocardiogram has been performed.  Ronny Flurry 06/19/2022, 10:28 AM

## 2022-06-24 ENCOUNTER — Encounter: Payer: Self-pay | Admitting: Cardiology

## 2022-06-24 ENCOUNTER — Ambulatory Visit: Payer: Medicare HMO | Admitting: Cardiology

## 2022-06-24 VITALS — BP 141/71 | HR 60 | Resp 18 | Ht 61.0 in | Wt 208.6 lb

## 2022-06-24 DIAGNOSIS — D472 Monoclonal gammopathy: Secondary | ICD-10-CM

## 2022-06-24 DIAGNOSIS — Z7901 Long term (current) use of anticoagulants: Secondary | ICD-10-CM

## 2022-06-24 DIAGNOSIS — I480 Paroxysmal atrial fibrillation: Secondary | ICD-10-CM | POA: Diagnosis not present

## 2022-06-24 DIAGNOSIS — I10 Essential (primary) hypertension: Secondary | ICD-10-CM

## 2022-06-24 MED ORDER — HYDROCHLOROTHIAZIDE 25 MG PO TABS: 25.0000 mg | ORAL_TABLET | Freq: Every morning | ORAL | 0 refills | Status: DC

## 2022-06-24 NOTE — Progress Notes (Signed)
ID:  [name], DOB 1950-09-19, MRN [id number]  PCP:  Lorenda Ishihara, MD  Cardiologist:  Tessa Lerner, DO, [facility] (established care 09/23/2019)  Date: 06/24/22 Last Office Visit: 04/25/2022  Chief Complaint  Patient presents with   Atrial Fibrillation   Follow-up    4 weeks    HPI  Denise Becker is a 72 y.o. female whose past medical history and cardiovascular risk factors include: History of breast cancer status post mastectomy/radiation/chemotherapy, Smoldering Multiple Myeloma, hypertension, postmenopausal female, advanced age, obesity due to excess calories.  In August 2023 patient was diagnosed with bacteremia and was found to be in A-fib with RVR.  She underwent TEE guided cardioversion and since then has been on AV nodal blocking agents and anticoagulation for thromboembolic prophylaxis.  At the last office visit we discussed proceeding with loop recorder implant for longitudinal evaluation of A-fib burden.  If she decided to proceed with loop implant will continue anticoagulation for 90 days and if no significant A-fib burden is found she could hold off on anticoagulation and reduce the risk of bleeding long-term.  Patient was interested to proceed forward but chose not to as well as a close friend/pastor has passed away.  She still is contemplating.  Since last office visit she did undergo an echocardiogram and strain imaging is not convincing for cardiac amyloidosis but overall GLS is below normal limits.  FUNCTIONAL STATUS: No structured exercise program or daily routine.    ALLERGIES: Allergies  Allergen Reactions   Effexor [Venlafaxine] Other (See Comments)    Made her nervous   Ciprofloxacin Diarrhea    Patient developed severe diarrhea on cipro   Penicillins Rash    Did it involve swelling of the face/tongue/throat, SOB, or low BP? No Did it involve sudden or severe rash/hives, skin peeling, or any reaction on the inside of your mouth or nose?  Yes Did you need to seek medical attention at a hospital or doctor's office? No When did it last happen? Several Years Ago       If all above answers are "NO", may proceed with cephalosporin use.      MEDICATION LIST PRIOR TO VISIT: Current Meds  Medication Sig   apixaban (ELIQUIS) 5 MG TABS tablet Take 1 tablet (5 mg total) by mouth 2 (two) times daily.   calcium-vitamin D (OSCAL WITH D) 500-5 MG-MCG tablet Take 1 tablet by mouth daily with breakfast.   hydrochlorothiazide (HYDRODIURIL) 25 MG tablet Take 1 tablet (25 mg total) by mouth every morning.   losartan (COZAAR) 100 MG tablet Take 1 tablet (100 mg total) by mouth daily.   metoprolol tartrate (LOPRESSOR) 25 MG tablet Take 1 tablet (25 mg total) by mouth 2 (two) times daily.   Multiple Vitamins-Minerals (MULTIVITAMIN ADULT) CHEW Chew 2 tablets by mouth 2 (two) times a week.   tamoxifen (NOLVADEX) 20 MG tablet Take 1 tablet (20 mg total) by mouth daily.   [DISCONTINUED] amLODipine (NORVASC) 10 MG tablet Take 1 tablet (10 mg total) by mouth daily.     PAST MEDICAL HISTORY: Past Medical History:  Diagnosis Date   Anxiety    anxiousabout impending surgery- loss of breast   Arthritis    "joints hurt q now and then" (10/25/2013)   Breast cancer (HCC)    Breast cancer of upper-outer quadrant of right female breast (HCC) 03/10/2013   Depression    history of depression after son died in the 2000's   GERD (gastroesophageal reflux disease)    "  just chemo related"   H/O hiatal hernia    Hypertension    Radiation 12/08/13-01/17/14   Right chest wall 50.4 Gy   Spontaneous dislocation of shoulder [phone]   "right; a few times over the years"    PAST SURGICAL HISTORY: Past Surgical History:  Procedure Laterality Date   ABDOMINAL HYSTERECTOMY  1990's   BREAST BIOPSY Right 02/2013   CARDIOVERSION  11/26/2021   Procedure: CARDIOVERSION;  Surgeon: Maisie Fus, MD;  Location: [facility];  Service: Cardiovascular;;   CESAREAN  SECTION  1980   MASTECTOMY COMPLETE / SIMPLE W/ SENTINEL NODE BIOPSY Right 10/25/2013   MASTECTOMY W/ SENTINEL NODE BIOPSY Right 10/25/2013   Procedure: RIGHT TOTAL MASTECTOMY WITH SENTINEL LYMPH NODE BIOPSY;  Surgeon: Ernestene Mention, MD;  Location: [facility] OR;  Service: General;  Laterality: Right;   MULTIPLE TOOTH EXTRACTIONS Bilateral 2011-2014 X 6   PORT A CATH REVISION Right 04/29/2013   Procedure: PORT A CATH REVISION;  Surgeon: Ernestene Mention, MD;  Location: [facility];  Service: General;  Laterality: Right;   PORT-A-CATH REMOVAL  10/25/2013   PORT-A-CATH REMOVAL N/A 10/25/2013   Procedure: REMOVAL PORT-A-CATH;  Surgeon: Ernestene Mention, MD;  Location: [facility] OR;  Service: General;  Laterality: N/A;   PORTACATH PLACEMENT Right 04/15/2013   Procedure: INSERTION PORT-A-CATH;  Surgeon: Ernestene Mention, MD;  Location: [facility];  Service: General;  Laterality: Right;   TEE WITHOUT CARDIOVERSION N/A 11/26/2021   Procedure: TRANSESOPHAGEAL ECHOCARDIOGRAM (TEE);  Surgeon: Maisie Fus, MD;  Location: [facility];  Service: Cardiovascular;  Laterality: N/A;   TONSILLECTOMY     WISDOM TOOTH EXTRACTION      FAMILY HISTORY: The patient family history includes Acute lymphoblastic leukemia in her son; Bowel Disease in her mother; Cancer in her father; Heart attack in her maternal uncle; Heart disease in her maternal grandfather, maternal grandmother, and maternal uncle; Hypertension in her mother; Lung disease in her father.  SOCIAL HISTORY:  The patient  reports that she quit smoking about 21 years ago. Her smoking use included cigarettes. She has a 0.75 pack-year smoking history. She has never used smokeless tobacco. She reports current alcohol use. She reports that she does not use drugs.  REVIEW OF SYSTEMS: Review of Systems  Cardiovascular:  Negative for chest pain, claudication, dyspnea on exertion, irregular heartbeat, leg swelling, near-syncope, orthopnea,  palpitations, paroxysmal nocturnal dyspnea and syncope.  Respiratory:  Negative for shortness of breath.   Hematologic/Lymphatic: Negative for bleeding problem.  Musculoskeletal:  Negative for muscle cramps and myalgias.  Neurological:  Negative for dizziness and light-headedness.    PHYSICAL EXAM:    06/24/2022   11:26 AM 04/25/2022   10:08 AM 04/25/2022    9:57 AM  Vitals with BMI  Height 5\' 1"   5\' 1"   Weight 208 lbs 10 oz  208 lbs 10 oz  BMI 39.43  39.43  Systolic 141 187  Diastolic 71 99 88  Pulse 60 80 84    Physical Exam  Constitutional: No distress.  Age appropriate, hemodynamically stable, ambulates with a cane.  Neck: No JVD present.  Cardiovascular: Normal rate, regular rhythm, S1 normal, S2 normal, intact distal pulses and normal pulses. Exam reveals no gallop, no S3 and no S4.  No murmur heard. Pulmonary/Chest: Effort normal and breath sounds normal. No stridor. She has no wheezes. She has no rales.  History of right mastectomy  Abdominal: Soft. Bowel sounds are normal. She exhibits no distension. There  is no abdominal tenderness.  Musculoskeletal:        General: No edema.     Cervical back: Neck supple.  Neurological: She is alert and oriented to person, place, and time. She has intact cranial nerves (2-12).  Skin: Skin is warm and moist.   CARDIAC DATABASE: EKG: 06/24/2022: Sinus bradycardia, 57 bpm, nonspecific T wave abnormality.  Echocardiogram: 03/29/2013: LVEF 55-60%, normal wall motion, no regional wall motion abnormalities, average global longitudinal strain -18.6%.    09/29/2019: LVEF 55%, Mild LVH, Grade II diastolic dysfunction, mildly LAE.   06/19/2022:  1. Left ventricular ejection fraction, by estimation, is 55 to 60%. The left ventricle has normal function. The left ventricle has no regional wall motion abnormalities. There is mild left ventricular hypertrophy.  Left ventricular diastolic parameters were normal. The average left ventricular  global longitudinal strain is -10.6 %. The global longitudinal strain is abnormal.   2. Right ventricular systolic function is normal. The right ventricular  size is normal.   3. The mitral valve is normal in structure. No evidence of mitral valve regurgitation. No evidence of mitral stenosis.   4. The aortic valve was not well visualized. Aortic valve regurgitation is not visualized. No aortic stenosis is present.   5. Rhythm strip during this exam demonstrates normal sinus rhythm.  Comparison(s): Compared to transesophageal echocardiogram 11/26/2021 LVEF remains preserved otherwise no significant change.   Stress Testing: None  Heart Catheterization: None  Lower Extremity Venous Duplex  09/29/2019:  No evidence of deep vein thrombosis of the lower extremities with normal venous return.   LABORATORY DATA:    Latest Ref Rng & Units 03/12/2022    9:56 AM 11/26/2021    5:02 AM 11/25/2021    3:45 AM  CBC  WBC 4.0 - 10.5 K/uL 7.4  11.8  13.1   Hemoglobin 12.0 - 15.0 g/dL 78.5  88.5  02.7   Hematocrit 36.0 - 46.0 % 39.7  40.1  38.3   Platelets 150 - 400 K/uL 283  248  220    BMP Recent Labs    11/24/21 0416 11/25/21 0345 03/12/22 0956  NA 140 139 138  K 3.5 3.5 3.7  CL 109 104 106  CO2 25 28 29   GLUCOSE 103* 103* 98  BUN 12 11 13   CREATININE 0.77 0.78 0.72  CALCIUM 8.6* 9.1 9.8  GFRNONAA >60 >60 >60   External Labs: Collected: 08/23/2019 Creatinine 0.87 mg/dL. eGFR: 79 mL/min per 1.73 m Lipid profile: Total cholesterol 183, triglycerides 62, HDL 56, LDL 113, non-HDL 127 TSH:   IMPRESSION:    ICD-10-CM   1. Paroxysmal atrial fibrillation (HCC)  I48.0 EKG 12-Lead    2. Long term (current) use of anticoagulants  Z79.01     3. Essential hypertension, benign  I10 hydrochlorothiazide (HYDRODIURIL) 25 MG tablet    Basic metabolic panel    4. 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 female whose past medical  history and cardiac risk factors include: History of breast cancer status post mastectomy/radiation/chemotherapy, Smoldering Multiple Myeloma, hypertension, postmenopausal female, advanced age, obesity due to excess calories.  Paroxysmal atrial fibrillation (HCC) Rate control: Lopressor. Rhythm control: N/A. Thromboembolic prophylaxis: Eliquis [name] SCORE is 3 which correlates to 3.2 % risk of stroke per year (hypertension, age, gender).  Diagnosed with atrial fibrillation during her episode of bacteremia. Since her bacteremia she has been anticoagulated for at least 3 months. We discussed  long-term anticoagulation versus considering loop recorder. She still interested in proceeding with loop recorder implant and will call us back when ready.  We reviewed the risks, benefits, alternatives to the procedure.  Long term (current) use of anticoagulants Indication: Paroxysmal atrial fibrillation status post cardioversion Discussed the risks, benefits, and alternatives to oral anticoagulation. Labs from December 2023 reviewed hemoglobin is within acceptable limits. Does not endorse evidence of bleeding.  Essential hypertension, benign Office blood pressures are not well-controlled Discontinue amlodipine. Start hydrochlorothiazide 25 mg p.o. daily Labs in 1 week to evaluate kidney function   FINAL MEDICATION LIST END OF ENCOUNTER: Meds ordered this encounter  Medications   hydrochlorothiazide (HYDRODIURIL) 25 MG tablet    Sig: Take 1 tablet (25 mg total) by mouth every morning.    Dispense:  90 tablet    Refill:  0    Medications Discontinued During This Encounter  Medication Reason   omeprazole (PRILOSEC) 20 MG capsule    amLODipine (NORVASC) 10 MG tablet Change in therapy    Current Outpatient Medications:    apixaban (ELIQUIS) 5 MG TABS tablet, Take 1 tablet (5 mg total) by mouth 2 (two) times daily., Disp: 60 tablet, Rfl: 3   calcium-vitamin D (OSCAL WITH D) 500-5 MG-MCG  tablet, Take 1 tablet by mouth daily with breakfast., Disp: 30 tablet, Rfl: 6   hydrochlorothiazide (HYDRODIURIL) 25 MG tablet, Take 1 tablet (25 mg total) by mouth every morning., Disp: 90 tablet, Rfl: 0   losartan (COZAAR) 100 MG tablet, Take 1 tablet (100 mg total) by mouth daily., Disp: 30 tablet, Rfl: 0   metoprolol tartrate (LOPRESSOR) 25 MG tablet, Take 1 tablet (25 mg total) by mouth 2 (two) times daily., Disp: 90 tablet, Rfl: 1   Multiple Vitamins-Minerals (MULTIVITAMIN ADULT) CHEW, Chew 2 tablets by mouth 2 (two) times a week., Disp: , Rfl:    tamoxifen (NOLVADEX) 20 MG tablet, Take 1 tablet (20 mg total) by mouth daily., Disp: 90 tablet, Rfl: 4  Orders Placed This Encounter  Procedures   Basic metabolic panel   EKG 12-Lead    There are no Patient Instructions on file for this visit.  --Continue cardiac medications as reconciled in final medication list. --Return in about 3 months (around 09/24/2022) for Follow up, A. fib. Or sooner if needed. --Continue follow-up with your primary care physician regarding the management of your other chronic comorbid conditions.  Patient's questions and concerns were addressed to her satisfaction. She voices understanding of the instructions provided during this encounter.   This note was created using a voice recognition software as a result there may be grammatical errors inadvertently enclosed that do not reflect the nature of this encounter. Every attempt is made to correct such errors.  Tessa Lerner, [place], [facility]  Pager:  [phone] Office: [phone]

## 2022-06-25 DIAGNOSIS — I10 Essential (primary) hypertension: Secondary | ICD-10-CM | POA: Diagnosis not present

## 2022-06-25 DIAGNOSIS — I4891 Unspecified atrial fibrillation: Secondary | ICD-10-CM | POA: Diagnosis not present

## 2022-06-25 LAB — ECHOCARDIOGRAM COMPLETE
AR max vel: 1.95 cm2
AV Area VTI: 1.78 cm2
AV Area mean vel: 1.81 cm2
AV Mean grad: 5 mmHg
AV Peak grad: 10.1 mmHg
Ao pk vel: 1.59 m/s
Area-P 1/2: 3.6 cm2
S' Lateral: 2.85 cm

## 2022-07-16 DIAGNOSIS — F330 Major depressive disorder, recurrent, mild: Secondary | ICD-10-CM | POA: Diagnosis not present

## 2022-07-16 DIAGNOSIS — F411 Generalized anxiety disorder: Secondary | ICD-10-CM | POA: Diagnosis not present

## 2022-07-29 DIAGNOSIS — F330 Major depressive disorder, recurrent, mild: Secondary | ICD-10-CM | POA: Diagnosis not present

## 2022-07-29 DIAGNOSIS — F411 Generalized anxiety disorder: Secondary | ICD-10-CM | POA: Diagnosis not present

## 2022-08-26 DIAGNOSIS — F411 Generalized anxiety disorder: Secondary | ICD-10-CM | POA: Diagnosis not present

## 2022-09-08 ENCOUNTER — Telehealth: Payer: Self-pay | Admitting: Hematology and Oncology

## 2022-09-09 ENCOUNTER — Other Ambulatory Visit: Payer: Medicare HMO

## 2022-09-09 ENCOUNTER — Ambulatory Visit: Payer: Medicare HMO | Admitting: Hematology and Oncology

## 2022-09-15 DIAGNOSIS — F411 Generalized anxiety disorder: Secondary | ICD-10-CM | POA: Diagnosis not present

## 2022-09-25 ENCOUNTER — Ambulatory Visit: Payer: Medicare HMO | Admitting: Cardiology

## 2022-09-27 ENCOUNTER — Other Ambulatory Visit: Payer: Self-pay | Admitting: Cardiology

## 2022-09-27 DIAGNOSIS — I10 Essential (primary) hypertension: Secondary | ICD-10-CM

## 2022-10-10 ENCOUNTER — Other Ambulatory Visit: Payer: Self-pay | Admitting: Internal Medicine

## 2022-10-15 ENCOUNTER — Telehealth: Payer: Self-pay | Admitting: Hematology and Oncology

## 2022-10-15 ENCOUNTER — Other Ambulatory Visit: Payer: Self-pay | Admitting: *Deleted

## 2022-10-15 DIAGNOSIS — D472 Monoclonal gammopathy: Secondary | ICD-10-CM

## 2022-10-15 DIAGNOSIS — C50411 Malignant neoplasm of upper-outer quadrant of right female breast: Secondary | ICD-10-CM

## 2022-10-15 NOTE — Telephone Encounter (Signed)
Patient called in and said they were currently sick, patient is aware of rescheduled appointment times/dates

## 2022-10-16 ENCOUNTER — Inpatient Hospital Stay: Payer: Medicare HMO

## 2022-10-16 ENCOUNTER — Inpatient Hospital Stay: Payer: Medicare HMO | Admitting: Hematology and Oncology

## 2022-11-04 ENCOUNTER — Ambulatory Visit: Payer: Medicare HMO | Admitting: Cardiology

## 2022-11-25 ENCOUNTER — Other Ambulatory Visit: Payer: Self-pay

## 2022-11-25 DIAGNOSIS — I10 Essential (primary) hypertension: Secondary | ICD-10-CM | POA: Diagnosis not present

## 2022-11-26 LAB — BASIC METABOLIC PANEL
BUN/Creatinine Ratio: 15 (ref 12–28)
BUN: 14 mg/dL (ref 8–27)
CO2: 24 mmol/L (ref 20–29)
Calcium: 9.7 mg/dL (ref 8.7–10.3)
Chloride: 106 mmol/L (ref 96–106)
Creatinine, Ser: 0.94 mg/dL (ref 0.57–1.00)
Glucose: 83 mg/dL (ref 70–99)
Potassium: 4.2 mmol/L (ref 3.5–5.2)
Sodium: 142 mmol/L (ref 134–144)
eGFR: 64 mL/min/{1.73_m2} (ref >59)

## 2022-12-04 ENCOUNTER — Ambulatory Visit: Payer: Medicare HMO | Admitting: Cardiology

## 2022-12-04 ENCOUNTER — Encounter: Payer: Self-pay | Admitting: Cardiology

## 2022-12-04 VITALS — BP 164/80 | HR 58 | Resp 16 | Ht 61.0 in | Wt 208.0 lb

## 2022-12-04 DIAGNOSIS — I480 Paroxysmal atrial fibrillation: Secondary | ICD-10-CM | POA: Diagnosis not present

## 2022-12-04 DIAGNOSIS — Z7901 Long term (current) use of anticoagulants: Secondary | ICD-10-CM | POA: Diagnosis not present

## 2022-12-04 DIAGNOSIS — I10 Essential (primary) hypertension: Secondary | ICD-10-CM

## 2022-12-04 MED ORDER — APIXABAN 5 MG PO TABS
5.0000 mg | ORAL_TABLET | Freq: Two times a day (BID) | ORAL | 0 refills | Status: AC
Start: 2022-12-04 — End: 2023-12-15

## 2022-12-04 NOTE — Progress Notes (Signed)
ID:  Denise Becker, DOB 09/05/1950, MRN [id number]  PCP:  Lorenda Ishihara, MD  Cardiologist:  Tessa Lerner, DO, [facility] (established care 09/23/2019)  Date: 12/04/22 Last Office Visit: 06/24/2022  Chief Complaint  Patient presents with   Atrial Fibrillation   Follow-up    HPI  Denise Becker is a 72 y.o. female whose past medical history and cardiovascular risk factors include: History of breast cancer status post mastectomy/radiation/chemotherapy, Smoldering Multiple Myeloma, hypertension, postmenopausal female, advanced age, obesity due to excess calories.  Patient presents today for 57-month follow-up visit.  Patient is being followed by the practice given her history of paroxysmal atrial fibrillation.  In August 2023 she was diagnosed with bacteremia was found in A-fib with RVR and eventually needed to undergo TEE guided cardioversion.  Since then she has been placed on AV nodal blocking agents and anticoagulation for thromboembolic prophylaxis.  Presents today for 51-month follow-up visit.  In the past we have discussed considering loop recorder implant to evaluate for A-fib burden longitudinally however, patient has pushed off loop recorder implant for various reasons and continues to be on medical therapy.  At the last office visit she was started on hydrochlorothiazide and amlodipine was discontinued follow-up labs note stable renal function and electrolytes.  Her office blood pressures are not well-controlled but states that she did not take her hydrochlorothiazide this morning to prevent frequent bathroom trips.  She does not check her blood pressures regularly at home either.  Otherwise denies anginal chest pain or heart failure symptoms.  FUNCTIONAL STATUS: No structured exercise program or daily routine.    ALLERGIES: Allergies  Allergen Reactions   Effexor [Venlafaxine] Other (See Comments)    Made her nervous   Ciprofloxacin Diarrhea    Patient  developed severe diarrhea on cipro   Penicillins Rash    Did it involve swelling of the face/tongue/throat, SOB, or low BP? No Did it involve sudden or severe rash/hives, skin peeling, or any reaction on the inside of your mouth or nose? Yes Did you need to seek medical attention at a hospital or doctor's office? No When did it last happen? Several Years Ago       If all above answers are "NO", may proceed with cephalosporin use.      MEDICATION LIST PRIOR TO VISIT: Current Meds  Medication Sig   calcium-vitamin D (OSCAL WITH D) 500-5 MG-MCG tablet Take 1 tablet by mouth daily with breakfast.   hydrochlorothiazide (HYDRODIURIL) 25 MG tablet TAKE 1 TABLET(25 MG) BY MOUTH EVERY MORNING   losartan (COZAAR) 100 MG tablet Take 1 tablet (100 mg total) by mouth daily.   metoprolol tartrate (LOPRESSOR) 25 MG tablet Take 1 tablet (25 mg total) by mouth 2 (two) times daily.   Multiple Vitamins-Minerals (MULTIVITAMIN ADULT) CHEW Chew 2 tablets by mouth 2 (two) times a week.   tamoxifen (NOLVADEX) 20 MG tablet Take 1 tablet (20 mg total) by mouth daily.   [DISCONTINUED] ELIQUIS 5 MG TABS tablet TAKE 1 TABLET(5 MG) BY MOUTH TWICE DAILY     PAST MEDICAL HISTORY: Past Medical History:  Diagnosis Date   Anxiety    anxiousabout impending surgery- loss of breast   Arthritis    "joints hurt q now and then" (10/25/2013)   Breast cancer (HCC)    Breast cancer of upper-outer quadrant of right female breast (HCC) 03/10/2013   Depression    history of depression after son died in the 2000's   GERD (gastroesophageal reflux disease)    "  just chemo related"   H/O hiatal hernia    Hypertension    Radiation 12/08/13-01/17/14   Right chest wall 50.4 Gy   Spontaneous dislocation of shoulder [phone]   "right; a few times over the years"    PAST SURGICAL HISTORY: Past Surgical History:  Procedure Laterality Date   ABDOMINAL HYSTERECTOMY  1990's   BREAST BIOPSY Right 02/2013   CARDIOVERSION   11/26/2021   Procedure: CARDIOVERSION;  Surgeon: Maisie Fus, MD;  Location: [facility];  Service: Cardiovascular;;   CESAREAN SECTION  1980   MASTECTOMY COMPLETE / SIMPLE W/ SENTINEL NODE BIOPSY Right 10/25/2013   MASTECTOMY W/ SENTINEL NODE BIOPSY Right 10/25/2013   Procedure: RIGHT TOTAL MASTECTOMY WITH SENTINEL LYMPH NODE BIOPSY;  Surgeon: Ernestene Mention, MD;  Location: [facility] OR;  Service: General;  Laterality: Right;   MULTIPLE TOOTH EXTRACTIONS Bilateral 2011-2014 X 6   PORT A CATH REVISION Right 04/29/2013   Procedure: PORT A CATH REVISION;  Surgeon: Ernestene Mention, MD;  Location: [facility];  Service: General;  Laterality: Right;   PORT-A-CATH REMOVAL  10/25/2013   PORT-A-CATH REMOVAL N/A 10/25/2013   Procedure: REMOVAL PORT-A-CATH;  Surgeon: Ernestene Mention, MD;  Location: [facility] OR;  Service: General;  Laterality: N/A;   PORTACATH PLACEMENT Right 04/15/2013   Procedure: INSERTION PORT-A-CATH;  Surgeon: Ernestene Mention, MD;  Location: [facility];  Service: General;  Laterality: Right;   TEE WITHOUT CARDIOVERSION N/A 11/26/2021   Procedure: TRANSESOPHAGEAL ECHOCARDIOGRAM (TEE);  Surgeon: Maisie Fus, MD;  Location: [facility] END[facility]COPY;  Service: Cardiovascular;  Laterality: N/A;   TONSILLECTOMY     WISDOM TOOTH EXTRACTION      FAMILY HISTORY: The patient family history includes Acute lymphoblastic leukemia in her son; Bowel Disease in her mother; Cancer in her father; Heart attack in her maternal uncle; Heart disease in her maternal grandfather, maternal grandmother, and maternal uncle; Hypertension in her mother; Lung disease in her father.  SOCIAL HISTORY:  The patient  reports that she quit smoking about 21 years ago. Her smoking use included cigarettes. She started smoking about 24 years ago. She has a 0.8 pack-year smoking history. She has never used smokeless tobacco. She reports current alcohol use. She reports that she does not use drugs.  REVIEW OF  SYSTEMS: Review of Systems  Cardiovascular:  Negative for chest pain, claudication, dyspnea on exertion, irregular heartbeat, leg swelling, near-syncope, orthopnea, palpitations, paroxysmal nocturnal dyspnea and syncope.  Respiratory:  Negative for shortness of breath.   Hematologic/Lymphatic: Negative for bleeding problem.  Musculoskeletal:  Negative for muscle cramps and myalgias.  Neurological:  Negative for dizziness and light-headedness.    PHYSICAL EXAM:    12/04/2022    3:01 PM 12/04/2022    2:27 PM 06/24/2022   11:26 AM  Vitals with BMI  Height  5\' 1"  5\' 1"   Weight  208 lbs 208 lbs 10 oz  BMI  39.32 39.43  Systolic 164 186 409  Diastolic 80 85 71  Pulse 58 54 60    Physical Exam  Constitutional: No distress.  Age appropriate, hemodynamically stable, ambulates with a cane.  Neck: No JVD present.  Cardiovascular: Normal rate, regular rhythm, S1 normal, S2 normal, intact distal pulses and normal pulses. Exam reveals no gallop, no S3 and no S4.  No murmur heard. Pulmonary/Chest: Effort normal and breath sounds normal. No stridor. She has no wheezes. She has no rales.  History of right mastectomy  Abdominal: Soft. Bowel sounds are  normal. She exhibits no distension. There is no abdominal tenderness.  Musculoskeletal:        General: No edema.     Cervical back: Neck supple.  Neurological: She is alert and oriented to person, place, and time. She has intact cranial nerves (2-12).  Skin: Skin is warm and moist.   CARDIAC DATABASE: EKG: 12/04/2022: Sinus bradycardia, 54 bpm, TWI in anterolateral leads consider ischemia without underlying injury pattern. When compared to 06/24/2022 lateral TWI are more pronounced.  Echocardiogram: 04/21/2013: LVEF 55-60%, normal wall motion, no regional wall motion abnormalities, average global longitudinal strain -18.6%.    09/29/2019: LVEF 55%, Mild LVH, Grade II diastolic dysfunction, mildly LAE.   06/19/2022:  1. Left ventricular ejection  fraction, by estimation, is 55 to 60%. The left ventricle has normal function. The left ventricle has no regional wall motion abnormalities. There is mild left ventricular hypertrophy.  Left ventricular diastolic parameters were normal. The average left ventricular global longitudinal strain is -10.6 %. The global longitudinal strain is abnormal.   2. Right ventricular systolic function is normal. The right ventricular  size is normal.   3. The mitral valve is normal in structure. No evidence of mitral valve regurgitation. No evidence of mitral stenosis.   4. The aortic valve was not well visualized. Aortic valve regurgitation is not visualized. No aortic stenosis is present.   5. Rhythm strip during this exam demonstrates normal sinus rhythm.  Comparison(s): Compared to transesophageal echocardiogram 11/26/2021 LVEF remains preserved otherwise no significant change.   Stress Testing: None  Heart Catheterization: None  Lower Extremity Venous Duplex  09/29/2019:  No evidence of deep vein thrombosis of the lower extremities with normal venous return.   LABORATORY DATA:    Latest Ref Rng & Units 03/12/2022    9:56 AM 11/26/2021    5:02 AM 11/25/2021    3:45 AM  CBC  WBC 4.0 - 10.5 K/uL 7.4  11.8  13.1   Hemoglobin 12.0 - 15.0 g/dL 16.1  09.6  04.5   Hematocrit 36.0 - 46.0 % 39.7  40.1  38.3   Platelets 150 - 400 K/uL 283  248  220    BMP Recent Labs    03/12/22 0956 11/25/22 1629  NA 138 142  K 3.7 4.2  CL 106 106  CO2 29 24  GLUCOSE 98 83  BUN 13 14  CREATININE 0.72 0.94  CALCIUM 9.8 9.7  GFRNONAA >60  --    External Labs: Collected: 08/23/2019 Creatinine 0.87 mg/dL. eGFR: 79 mL/min per 1.73 m Lipid profile: Total cholesterol 183, triglycerides 62, HDL 56, LDL 113, non-HDL 127 TSH: 4.09   IMPRESSION:    ICD-10-CM   1. Paroxysmal atrial fibrillation (HCC)  I48.0 EKG 12-Lead    apixaban (ELIQUIS) 5 MG TABS tablet    2. Long term (current) use of anticoagulants   Z79.01 apixaban (ELIQUIS) 5 MG TABS tablet    3. Essential hypertension, benign  I10        RECOMMENDATIONS: Denise Becker is a 72 y.o. female whose past medical history and cardiac risk factors include: History of breast cancer status post mastectomy/radiation/chemotherapy, Smoldering Multiple Myeloma, hypertension, postmenopausal female, advanced age, obesity due to excess calories.  Paroxysmal atrial fibrillation (HCC) Rate control: Lopressor. Rhythm control: N/A. Thromboembolic prophylaxis: Eliquis [id number] SCORE is 3 which correlates to 3.2 % risk of stroke per year (hypertension, age, gender).  Diagnosed with atrial fibrillation during her episode of bacteremia. In the past recommended loop recorder implant to  evaluate for A-fib burden.  There is no significant A-fib burden is detected in the first 90 days it would be reasonable to discontinue anticoagulation and to monitor clinically.  However, patient prefers to be on medical therapy and understands the risks, benefits, alternatives to anticoagulation.  Long term (current) use of anticoagulants Indication: Paroxysmal atrial fibrillation status post cardioversion Patient does not endorse evidence of bleeding. She will have labs with PCP tomorrow and will send [name] a copy for reference.  Would like to reevaluate her hemoglobin.  Essential hypertension, benign Office blood pressures are not well-controlled. Has not taken her hydrochlorothiazide earlier this morning to prevent frequent urination trips to the bathroom. I have asked her to keep a log of her blood pressures and to review with PCP. Patient understands that uncontrolled hypertension can lead to but not limited to stroke, cardiomyopathy, progression of chronic kidney disease.  FINAL MEDICATION LIST END OF ENCOUNTER: Meds ordered this encounter  Medications   apixaban (ELIQUIS) 5 MG TABS tablet    Sig: Take 1 tablet (5 mg total) by mouth 2 (two) times daily.     Dispense:  180 tablet    Refill:  0    Medications Discontinued During This Encounter  Medication Reason   ELIQUIS 5 MG TABS tablet Reorder     Current Outpatient Medications:    calcium-vitamin D (OSCAL WITH D) 500-5 MG-MCG tablet, Take 1 tablet by mouth daily with breakfast., Disp: 30 tablet, Rfl: 6   hydrochlorothiazide (HYDRODIURIL) 25 MG tablet, TAKE 1 TABLET(25 MG) BY MOUTH EVERY MORNING, Disp: 90 tablet, Rfl: 0   losartan (COZAAR) 100 MG tablet, Take 1 tablet (100 mg total) by mouth daily., Disp: 30 tablet, Rfl: 0   metoprolol tartrate (LOPRESSOR) 25 MG tablet, Take 1 tablet (25 mg total) by mouth 2 (two) times daily., Disp: 90 tablet, Rfl: 1   Multiple Vitamins-Minerals (MULTIVITAMIN ADULT) CHEW, Chew 2 tablets by mouth 2 (two) times a week., Disp: , Rfl:    tamoxifen (NOLVADEX) 20 MG tablet, Take 1 tablet (20 mg total) by mouth daily., Disp: 90 tablet, Rfl: 4   apixaban (ELIQUIS) 5 MG TABS tablet, Take 1 tablet (5 mg total) by mouth 2 (two) times daily., Disp: 180 tablet, Rfl: 0  Orders Placed This Encounter  Procedures   EKG 12-Lead    There are no Patient Instructions on file for this visit.  --Continue cardiac medications as reconciled in final medication list. --Return in about 6 months (around 06/03/2023) for Follow up, A. fib. Or sooner if needed. --Continue follow-up with your primary care physician regarding the management of your other chronic comorbid conditions.  Patient's questions and concerns were addressed to her satisfaction. She voices understanding of the instructions provided during this encounter.   This note was created using a voice recognition software as a result there may be grammatical errors inadvertently enclosed that do not reflect the nature of this encounter. Every attempt is made to correct such errors.  Tessa Lerner, [place], [facility]  Pager:  [phone] Office: [phone]

## 2022-12-05 DIAGNOSIS — I480 Paroxysmal atrial fibrillation: Secondary | ICD-10-CM | POA: Diagnosis not present

## 2022-12-05 DIAGNOSIS — Z0000 Encounter for general adult medical examination without abnormal findings: Secondary | ICD-10-CM | POA: Diagnosis not present

## 2022-12-05 DIAGNOSIS — Z6841 Body Mass Index (BMI) 40.0 and over, adult: Secondary | ICD-10-CM | POA: Diagnosis not present

## 2022-12-05 DIAGNOSIS — D6869 Other thrombophilia: Secondary | ICD-10-CM | POA: Diagnosis not present

## 2022-12-05 DIAGNOSIS — Z1159 Encounter for screening for other viral diseases: Secondary | ICD-10-CM | POA: Diagnosis not present

## 2022-12-05 DIAGNOSIS — R262 Difficulty in walking, not elsewhere classified: Secondary | ICD-10-CM | POA: Diagnosis not present

## 2022-12-05 DIAGNOSIS — I10 Essential (primary) hypertension: Secondary | ICD-10-CM | POA: Diagnosis not present

## 2022-12-05 DIAGNOSIS — Z1389 Encounter for screening for other disorder: Secondary | ICD-10-CM | POA: Diagnosis not present

## 2022-12-05 DIAGNOSIS — M1991 Primary osteoarthritis, unspecified site: Secondary | ICD-10-CM | POA: Diagnosis not present

## 2022-12-05 DIAGNOSIS — R200 Anesthesia of skin: Secondary | ICD-10-CM | POA: Diagnosis not present

## 2022-12-05 DIAGNOSIS — K219 Gastro-esophageal reflux disease without esophagitis: Secondary | ICD-10-CM | POA: Diagnosis not present

## 2022-12-05 DIAGNOSIS — H401110 Primary open-angle glaucoma, right eye, stage unspecified: Secondary | ICD-10-CM | POA: Diagnosis not present

## 2022-12-09 ENCOUNTER — Other Ambulatory Visit: Payer: Self-pay

## 2022-12-09 ENCOUNTER — Inpatient Hospital Stay: Payer: Medicare HMO | Attending: Hematology and Oncology

## 2022-12-09 ENCOUNTER — Inpatient Hospital Stay: Payer: Medicare HMO | Admitting: Hematology and Oncology

## 2022-12-09 VITALS — BP 178/99 | HR 81 | Temp 97.7°F | Resp 16 | Wt 204.6 lb

## 2022-12-09 DIAGNOSIS — Z79810 Long term (current) use of selective estrogen receptor modulators (SERMs): Secondary | ICD-10-CM | POA: Insufficient documentation

## 2022-12-09 DIAGNOSIS — C50411 Malignant neoplasm of upper-outer quadrant of right female breast: Secondary | ICD-10-CM | POA: Insufficient documentation

## 2022-12-09 DIAGNOSIS — Z87891 Personal history of nicotine dependence: Secondary | ICD-10-CM | POA: Diagnosis not present

## 2022-12-09 DIAGNOSIS — Z809 Family history of malignant neoplasm, unspecified: Secondary | ICD-10-CM | POA: Diagnosis not present

## 2022-12-09 DIAGNOSIS — Z170 Estrogen receptor positive status [ER+]: Secondary | ICD-10-CM | POA: Diagnosis not present

## 2022-12-09 DIAGNOSIS — D472 Monoclonal gammopathy: Secondary | ICD-10-CM | POA: Diagnosis not present

## 2022-12-09 DIAGNOSIS — Z9011 Acquired absence of right breast and nipple: Secondary | ICD-10-CM | POA: Insufficient documentation

## 2022-12-09 DIAGNOSIS — Z923 Personal history of irradiation: Secondary | ICD-10-CM | POA: Diagnosis not present

## 2022-12-09 DIAGNOSIS — Z806 Family history of leukemia: Secondary | ICD-10-CM | POA: Insufficient documentation

## 2022-12-09 LAB — CBC WITH DIFFERENTIAL (CANCER CENTER ONLY)
Abs Immature Granulocytes: 0.02 10*3/uL (ref 0.00–0.07)
Basophils Absolute: 0.1 10*3/uL (ref 0.0–0.1)
Basophils Relative: 1 %
Eosinophils Absolute: 0.3 10*3/uL (ref 0.0–0.5)
Eosinophils Relative: 4 %
HCT: 43.5 % (ref 36.0–46.0)
Hemoglobin: 14.4 g/dL (ref 12.0–15.0)
Immature Granulocytes: 0 %
Lymphocytes Relative: 40 %
Lymphs Abs: 3.4 10*3/uL (ref 0.7–4.0)
MCH: 27.5 pg (ref 26.0–34.0)
MCHC: 33.1 g/dL (ref 30.0–36.0)
MCV: 83.2 fL (ref 80.0–100.0)
Monocytes Absolute: 0.8 10*3/uL (ref 0.1–1.0)
Monocytes Relative: 9 %
Neutro Abs: 3.9 10*3/uL (ref 1.7–7.7)
Neutrophils Relative %: 46 %
Platelet Count: 272 10*3/uL (ref 150–400)
RBC: 5.23 MIL/uL — ABNORMAL HIGH (ref 3.87–5.11)
RDW: 14.8 % (ref 11.5–15.5)
WBC Count: 8.4 10*3/uL (ref 4.0–10.5)
nRBC: 0 % (ref 0.0–0.2)

## 2022-12-09 LAB — CMP (CANCER CENTER ONLY)
ALT: 13 U/L (ref 0–44)
AST: 13 U/L — ABNORMAL LOW (ref 15–41)
Albumin: 3.8 g/dL (ref 3.5–5.0)
Alkaline Phosphatase: 49 U/L (ref 38–126)
Anion gap: 5 (ref 5–15)
BUN: 20 mg/dL (ref 8–23)
CO2: 27 mmol/L (ref 22–32)
Calcium: 9.6 mg/dL (ref 8.9–10.3)
Chloride: 105 mmol/L (ref 98–111)
Creatinine: 0.88 mg/dL (ref 0.44–1.00)
GFR, Estimated: >60 mL/min (ref >60)
Glucose, Bld: 102 mg/dL — ABNORMAL HIGH (ref 70–99)
Potassium: 3.4 mmol/L — ABNORMAL LOW (ref 3.5–5.1)
Sodium: 137 mmol/L (ref 135–145)
Total Bilirubin: 0.3 mg/dL (ref 0.3–1.2)
Total Protein: 9 g/dL — ABNORMAL HIGH (ref 6.5–8.1)

## 2022-12-09 MED ORDER — TAMOXIFEN CITRATE 20 MG PO TABS: 20.0000 mg | ORAL_TABLET | Freq: Every day | ORAL | 4 refills | Status: AC

## 2022-12-09 NOTE — Progress Notes (Signed)
[facility] [phone]   [phone]  INITIAL CONSULT NOTE  Patient Care Team: Lorenda Ishihara, MD as PCP - General (Internal Medicine) Antionette Char, MD as Consulting Physician (Obstetrics and Gynecology)  Hematological/Oncological History #Right breast cancer -03/03/2013: Right breast biopsy: T3 N1, stage IIIA invasive ductal carcinoma, grade 3, estrogen receptor 100% positive, progesterone receptor 100% positive, with an MIB-1 of 20% and no HER-2 amplification -03/04/2014: Right axillary lymph node biopsy was negatibe -08/23/2013: Received 6 cycles of neoadjuvant chemotherapy with docetaxel, doxorubicin and cyclophosphamide. -10/25/2013: Underwent right total mastectomy and right axillary sentinel node biopsy. Pathology revealed ypT3 ypN0 invasive lobular carcinoma, again estrogen and progesterone receptor positive and HER-2 nonamplified; with negative margins -12/08/2013-01/17/2014: Adjuvant radiation to right chest wall. 50.4 Gy in 28 fractions.  -03/01/2014: Started tamoxifen with plans to continue for 10 years.   #Monoclonal gammopathy -03/13/2021: SPEP showed M protein measuring 2.1 g/dL, serum IgG measuring 4,540 mg/dL. Immunofixation showed IgG monocloncal protein with lamda light chain specificity. Lambda free light chains elevated at 641.1 with kappa/lamda ratio at 0.03.  -04/22/2021:Kappa free light chains elevated at 21.5 mg/L, Lambda free light chains elevated at 675.6 mg/L with kappa/lamda ratio at 0.03.  -05/21/2021: Establish care with Denise Kaufmann PA-C --Bone survey done on May 24, 2021 showed no evidence of lytic lesions.  Moderate multifocal degenerative changes --Bone marrow aspiration and biopsy done on July 02, 2021 showed normocellular bone marrow for age with plasmacytosis plasma cells 13% plasma cells apparently display lambda light chain excess and very limited staining most suggestive of plasma cell neoplasm  especially in presence of monoclonal protein.  Cytogenetics normal, FISH normal No significant change in labs in December to consider progression.  CHIEF COMPLAINTS/PURPOSE OF CONSULTATION:  Monoclonal gammopathy  HISTORY OF PRESENTING ILLNESS:   Denise Becker 72 y.o. female to the clinic to evaluate for monoclonal gammopathy.  Patient is unaccompanied for this visit.  Patient was last seen by Dr. Darnelle Catalan on 03/13/2021 for her history of right-sided breast cancer.  She was seen by Karena Addison and also for monoclonal gammopathy.  Since last visit,she continues on tamoxifen and is able to tolerate it very well. She will continue till Dec 2025, when she will be done with 10 yrs of anti estrogen therapy.  She denies any changes in her breast.  No B symptoms. No new bone pains. Rest of the pertinent 10 point ROS reviewed and neg.  MEDICAL HISTORY:  Past Medical History:  Diagnosis Date   Anxiety    anxiousabout impending surgery- loss of breast   Arthritis    "joints hurt q now and then" (10/25/2013)   Breast cancer (HCC)    Breast cancer of upper-outer quadrant of right female breast (HCC) 03/10/2013   Depression    history of depression after son died in the 2000's   GERD (gastroesophageal reflux disease)    "just chemo related"   H/O hiatal hernia    Hypertension    Radiation 12/08/13-01/17/14   Right chest wall 50.4 Gy   Spontaneous dislocation of shoulder [phone]   "right; a few times over the years"    SURGICAL HISTORY: Past Surgical History:  Procedure Laterality Date   ABDOMINAL HYSTERECTOMY  1990's   BREAST BIOPSY Right 02/2013   CARDIOVERSION  11/26/2021   Procedure: CARDIOVERSION;  Surgeon: Maisie Fus, MD;  Location: [facility] ENDOSCOPY;  Service: Cardiovascular;;   CESAREAN SECTION  1980   MASTECTOMY COMPLETE / SIMPLE W/ SENTINEL NODE BIOPSY Right 10/25/2013  MASTECTOMY W/ SENTINEL NODE BIOPSY Right 10/25/2013   Procedure: RIGHT TOTAL MASTECTOMY WITH SENTINEL LYMPH  NODE BIOPSY;  Surgeon: Ernestene Mention, MD;  Location: [facility] OR;  Service: General;  Laterality: Right;   MULTIPLE TOOTH EXTRACTIONS Bilateral 2011-2014 X 6   PORT A CATH REVISION Right 04/29/2013   Procedure: PORT A CATH REVISION;  Surgeon: Ernestene Mention, MD;  Location: [facility];  Service: General;  Laterality: Right;   PORT-A-CATH REMOVAL  10/25/2013   PORT-A-CATH REMOVAL N/A 10/25/2013   Procedure: REMOVAL PORT-A-CATH;  Surgeon: Ernestene Mention, MD;  Location: [facility] OR;  Service: General;  Laterality: N/A;   PORTACATH PLACEMENT Right 04/15/2013   Procedure: INSERTION PORT-A-CATH;  Surgeon: Ernestene Mention, MD;  Location: [facility];  Service: General;  Laterality: Right;   TEE WITHOUT CARDIOVERSION N/A 11/26/2021   Procedure: TRANSESOPHAGEAL ECHOCARDIOGRAM (TEE);  Surgeon: Maisie Fus, MD;  Location: [facility] ENDOSCOPY;  Service: Cardiovascular;  Laterality: N/A;   TONSILLECTOMY     WISDOM TOOTH EXTRACTION      SOCIAL HISTORY: Social History   Socioeconomic History   Marital status: Single    Spouse name: Not on file   Number of children: 1   Years of education: Not on file   Highest education level: Not on file  Occupational History   Not on file  Tobacco Use   Smoking status: Former    Current packs/day: 0.00    Average packs/day: 0.3 packs/day for 3.0 years (0.8 ttl pk-yrs)    Types: Cigarettes    Start date: 04/12/1998    Quit date: 04/12/2001    Years since quitting: 21.6   Smokeless tobacco: Never   Tobacco comments:    "casual smoker; pack would go stale on me"  Vaping Use   Vaping status: Never Used  Substance and Sexual Activity   Alcohol use: Yes    Comment: 10/25/2013 "might have a glass of wine a few times/year"   Drug use: No   Sexual activity: Never    Birth control/protection: Surgical  Other Topics Concern   Not on file  Social History Narrative   Not on file   Social Determinants of Health   Financial Resource Strain: Not on  file  Food Insecurity: Not on file  Transportation Needs: Not on file  Physical Activity: Not on file  Stress: Not on file  Social Connections: Not on file  Intimate Partner Violence: Not on file    FAMILY HISTORY: Family History  Problem Relation Age of Onset   Hypertension Mother    Bowel Disease Mother    Lung disease Father    Cancer Father    Heart disease Maternal Uncle    Heart attack Maternal Uncle    Heart disease Maternal Grandmother    Heart disease Maternal Grandfather    Acute lymphoblastic leukemia Son     ALLERGIES:  is allergic to effexor [venlafaxine], ciprofloxacin, and penicillins.  MEDICATIONS:  Current Outpatient Medications  Medication Sig Dispense Refill   apixaban (ELIQUIS) 5 MG TABS tablet Take 1 tablet (5 mg total) by mouth 2 (two) times daily. 180 tablet 0   calcium-vitamin D (OSCAL WITH D) 500-5 MG-MCG tablet Take 1 tablet by mouth daily with breakfast. 30 tablet 6   hydrochlorothiazide (HYDRODIURIL) 25 MG tablet TAKE 1 TABLET(25 MG) BY MOUTH EVERY MORNING 90 tablet 0   losartan (COZAAR) 100 MG tablet Take 1 tablet (100 mg total) by mouth daily. 30 tablet 0  metoprolol tartrate (LOPRESSOR) 25 MG tablet Take 1 tablet (25 mg total) by mouth 2 (two) times daily. 90 tablet 1   Multiple Vitamins-Minerals (MULTIVITAMIN ADULT) CHEW Chew 2 tablets by mouth 2 (two) times a week.     tamoxifen (NOLVADEX) 20 MG tablet Take 1 tablet (20 mg total) by mouth daily. 90 tablet 4   No current facility-administered medications for this visit.    REVIEW OF SYSTEMS:   Constitutional: ( - ) fevers, ( - )  chills , ( - ) night sweats Eyes: ( - ) blurriness of vision, ( - ) double vision, ( - ) watery eyes Ears, nose, mouth, throat, and face: ( - ) mucositis, ( - ) sore throat Respiratory: ( - ) cough, ( - ) dyspnea, ( - ) wheezes Cardiovascular: ( - ) palpitation, ( - ) chest discomfort, ( - ) lower extremity swelling Gastrointestinal:  ( - ) nausea, ( - )  heartburn, ( - ) change in bowel habits Skin: ( - ) abnormal skin rashes Lymphatics: ( - ) new lymphadenopathy, ( - ) easy bruising Neurological: ( - ) numbness, ( - ) tingling, ( - ) new weaknesses Behavioral/Psych: ( - ) mood change, ( - ) new changes  All other systems were reviewed with the patient and are negative.  PHYSICAL EXAMINATION: ECOG PERFORMANCE STATUS: 1 - Symptomatic but completely ambulatory  Vitals:   12/09/22 0953  BP: (!) 156/100  Pulse: 81  Resp: 16  Temp: 97.7 F (36.5 C)  SpO2: 94%    Filed Weights   12/09/22 0953  Weight: 204 lb 9.6 oz (92.8 kg)    Physical Exam Constitutional:      Appearance: Normal appearance.  Cardiovascular:     Rate and Rhythm: Normal rate and regular rhythm.     Pulses: Normal pulses.     Heart sounds: Normal heart sounds.  Pulmonary:     Effort: Pulmonary effort is normal.     Breath sounds: Normal breath sounds.  Chest:     Comments: Status post right mastectomy.  Left breast normal to inspection and palpation. Musculoskeletal:        General: Swelling (Right leg swelling greater than left leg swelling) present.     Cervical back: Normal range of motion and neck supple. No rigidity.  Lymphadenopathy:     Cervical: No cervical adenopathy.  Neurological:     Mental Status: She is alert.      LABORATORY DATA:  I have reviewed the data as listed    Latest Ref Rng & Units 12/09/2022    9:31 AM 03/12/2022    9:56 AM 11/26/2021    5:02 AM  CBC  WBC 4.0 - 10.5 K/uL 8.4  7.4  11.8   Hemoglobin 12.0 - 15.0 g/dL 29.5  28.4  13.2   Hematocrit 36.0 - 46.0 % 43.5  39.7  40.1   Platelets 150 - 400 K/uL 272  283  248        Latest Ref Rng & Units 11/25/2022    4:29 PM 03/12/2022    9:56 AM 11/25/2021    3:45 AM  CMP  Glucose 70 - 99 mg/dL 83  98  440   BUN 8 - 27 mg/dL 14  13  11    Creatinine 0.57 - 1.00 mg/dL 1.02  7.25  3.66   Sodium 134 - 144 mmol/L 142  138  139   Potassium 3.5 - 5.2 mmol/L 4.2  3.7  3.5  Chloride 96 - 106 mmol/L 106  106  104   CO2 20 - 29 mmol/L 24  29  28    Calcium 8.7 - 10.3 mg/dL 9.7  9.8  9.1   Total Protein 6.5 - 8.1 g/dL  8.3    Total Bilirubin 0.3 - 1.2 mg/dL  0.3    Alkaline Phos 38 - 126 U/L  49    AST 15 - 41 U/L  11    ALT 0 - 44 U/L  10     RADIOGRAPHIC STUDIES: I have personally reviewed the radiological images as listed and agreed with the findings in the report. No results found.  ASSESSMENT & PLAN THIARA PETTUS is a 72 y.o. 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  Bone survey negative. --Bone marrow biopsy findings reviewed. --Last labs in December with no evidence of progression.  She had a bone survey again which did not show any evidence of lytic lesions except for diffuse osteopenia.  No concerns on history and physical exam today.  CBC today unremarkable, awaiting the rest of the myeloma labs.  # H/O Right breast cancer --Currently on tamoxifen that she started on 03/01/2014 with plans to continue for 10 years.  --No concerns for breast cancer recurrence.  I have ordered her next mammogram. -We have previously evaluated the right lower extremity swelling and there was no evidence of DVT in the lower extremity.  There has not been any change in the swelling.  No orders of the defined types were placed in this encounter.   All questions were answered. The patient knows to call the clinic with any problems, questions or concerns.  I have spent a total of 30 minutes minutes of face-to-face and non-face-to-face time, preparing to see the patient, obtaining and/or reviewing separately obtained history, performing a medically appropriate examination, counseling and educating the patient, ordering tests/procedures, documenting clinical information in the electronic health record,  and care  coordination.

## 2022-12-09 NOTE — Telephone Encounter (Signed)
Order faxed to [facility] per Dr. Al Pimple. Tramission report successfully received.

## 2022-12-10 ENCOUNTER — Other Ambulatory Visit: Payer: Self-pay | Admitting: *Deleted

## 2022-12-10 DIAGNOSIS — Z170 Estrogen receptor positive status [ER+]: Secondary | ICD-10-CM | POA: Diagnosis not present

## 2022-12-10 DIAGNOSIS — Z79810 Long term (current) use of selective estrogen receptor modulators (SERMs): Secondary | ICD-10-CM | POA: Diagnosis not present

## 2022-12-10 DIAGNOSIS — C50411 Malignant neoplasm of upper-outer quadrant of right female breast: Secondary | ICD-10-CM | POA: Diagnosis not present

## 2022-12-10 DIAGNOSIS — Z809 Family history of malignant neoplasm, unspecified: Secondary | ICD-10-CM | POA: Diagnosis not present

## 2022-12-10 DIAGNOSIS — D472 Monoclonal gammopathy: Secondary | ICD-10-CM | POA: Diagnosis not present

## 2022-12-10 DIAGNOSIS — Z87891 Personal history of nicotine dependence: Secondary | ICD-10-CM | POA: Diagnosis not present

## 2022-12-10 DIAGNOSIS — Z806 Family history of leukemia: Secondary | ICD-10-CM | POA: Diagnosis not present

## 2022-12-10 DIAGNOSIS — Z923 Personal history of irradiation: Secondary | ICD-10-CM | POA: Diagnosis not present

## 2022-12-10 DIAGNOSIS — Z9011 Acquired absence of right breast and nipple: Secondary | ICD-10-CM | POA: Diagnosis not present

## 2022-12-10 LAB — KAPPA/LAMBDA LIGHT CHAINS
Kappa free light chain: 15.2 mg/L (ref 3.3–19.4)
Kappa, lambda light chain ratio: 0.02 — ABNORMAL LOW (ref 0.26–1.65)
Lambda free light chains: 963.2 mg/L — ABNORMAL HIGH (ref 5.7–26.3)

## 2022-12-12 LAB — MULTIPLE MYELOMA PANEL, SERUM
Albumin SerPl Elph-Mcnc: 3.6 g/dL (ref 2.9–4.4)
Albumin/Glob SerPl: 0.8 (ref 0.7–1.7)
Alpha 1: 0.3 g/dL (ref 0.0–0.4)
Alpha2 Glob SerPl Elph-Mcnc: 0.8 g/dL (ref 0.4–1.0)
B-Globulin SerPl Elph-Mcnc: 1.1 g/dL (ref 0.7–1.3)
Gamma Glob SerPl Elph-Mcnc: 2.6 g/dL — ABNORMAL HIGH (ref 0.4–1.8)
Globulin, Total: 4.8 g/dL — ABNORMAL HIGH (ref 2.2–3.9)
IgA: 132 mg/dL (ref 64–422)
IgG (Immunoglobin G), Serum: 3801 mg/dL — ABNORMAL HIGH (ref 586–1602)
IgM (Immunoglobulin M), Srm: 68 mg/dL (ref 26–217)
M Protein SerPl Elph-Mcnc: 2.5 g/dL — ABNORMAL HIGH
Total Protein ELP: 8.4 g/dL (ref 6.0–8.5)

## 2023-01-03 ENCOUNTER — Other Ambulatory Visit: Payer: Self-pay | Admitting: Cardiology

## 2023-01-03 DIAGNOSIS — I10 Essential (primary) hypertension: Secondary | ICD-10-CM

## 2023-01-09 DIAGNOSIS — Z23 Encounter for immunization: Secondary | ICD-10-CM | POA: Diagnosis not present

## 2023-02-04 ENCOUNTER — Inpatient Hospital Stay: Payer: Medicare HMO

## 2023-02-04 ENCOUNTER — Telehealth: Payer: Self-pay

## 2023-02-04 ENCOUNTER — Inpatient Hospital Stay: Payer: Medicare HMO | Attending: Hematology and Oncology | Admitting: Hematology and Oncology

## 2023-02-04 VITALS — BP 165/76 | HR 73 | Temp 97.8°F | Resp 16 | Wt 208.8 lb

## 2023-02-04 DIAGNOSIS — D472 Monoclonal gammopathy: Secondary | ICD-10-CM | POA: Diagnosis not present

## 2023-02-04 DIAGNOSIS — Z79810 Long term (current) use of selective estrogen receptor modulators (SERMs): Secondary | ICD-10-CM | POA: Insufficient documentation

## 2023-02-04 DIAGNOSIS — Z170 Estrogen receptor positive status [ER+]: Secondary | ICD-10-CM | POA: Insufficient documentation

## 2023-02-04 DIAGNOSIS — L630 Alopecia (capitis) totalis: Secondary | ICD-10-CM | POA: Diagnosis not present

## 2023-02-04 DIAGNOSIS — C50411 Malignant neoplasm of upper-outer quadrant of right female breast: Secondary | ICD-10-CM | POA: Diagnosis not present

## 2023-02-04 DIAGNOSIS — Z79899 Other long term (current) drug therapy: Secondary | ICD-10-CM | POA: Insufficient documentation

## 2023-02-04 LAB — CBC WITH DIFFERENTIAL/PLATELET
Abs Immature Granulocytes: 0.01 10*3/uL (ref 0.00–0.07)
Basophils Absolute: 0.1 10*3/uL (ref 0.0–0.1)
Basophils Relative: 1 %
Eosinophils Absolute: 0.4 10*3/uL (ref 0.0–0.5)
Eosinophils Relative: 5 %
HCT: 39.5 % (ref 36.0–46.0)
Hemoglobin: 13 g/dL (ref 12.0–15.0)
Immature Granulocytes: 0 %
Lymphocytes Relative: 45 %
Lymphs Abs: 3.5 10*3/uL (ref 0.7–4.0)
MCH: 28.1 pg (ref 26.0–34.0)
MCHC: 32.9 g/dL (ref 30.0–36.0)
MCV: 85.5 fL (ref 80.0–100.0)
Monocytes Absolute: 0.8 10*3/uL (ref 0.1–1.0)
Monocytes Relative: 10 %
Neutro Abs: 3 10*3/uL (ref 1.7–7.7)
Neutrophils Relative %: 39 %
Platelets: 258 10*3/uL (ref 150–400)
RBC: 4.62 MIL/uL (ref 3.87–5.11)
RDW: 14.7 % (ref 11.5–15.5)
WBC: 7.8 10*3/uL (ref 4.0–10.5)
nRBC: 0 % (ref 0.0–0.2)

## 2023-02-04 LAB — CMP (CANCER CENTER ONLY)
ALT: 12 U/L (ref 0–44)
AST: 13 U/L — ABNORMAL LOW (ref 15–41)
Albumin: 3.7 g/dL (ref 3.5–5.0)
Alkaline Phosphatase: 43 U/L (ref 38–126)
Anion gap: 4 — ABNORMAL LOW (ref 5–15)
BUN: 15 mg/dL (ref 8–23)
CO2: 32 mmol/L (ref 22–32)
Calcium: 10.1 mg/dL (ref 8.9–10.3)
Chloride: 103 mmol/L (ref 98–111)
Creatinine: 1.04 mg/dL — ABNORMAL HIGH (ref 0.44–1.00)
GFR, Estimated: 57 mL/min — ABNORMAL LOW (ref >60)
Glucose, Bld: 93 mg/dL (ref 70–99)
Potassium: 3.5 mmol/L (ref 3.5–5.1)
Sodium: 139 mmol/L (ref 135–145)
Total Bilirubin: 0.4 mg/dL (ref <1.2)
Total Protein: 8.7 g/dL — ABNORMAL HIGH (ref 6.5–8.1)

## 2023-02-04 LAB — VITAMIN B12: Vitamin B-12: 518 pg/mL (ref 180–914)

## 2023-02-04 NOTE — Telephone Encounter (Signed)
Referral faxed to Dr. Zannie Kehr as requested by MD. Fax confirmation received.

## 2023-02-04 NOTE — Progress Notes (Signed)
[facility] Telephone:(336) [phone]   [phone]  INITIAL CONSULT NOTE  Patient Care Team: Lorenda Ishihara, MD as PCP - General (Internal Medicine) Antionette Char, MD as Consulting Physician (Obstetrics and Gynecology)  Hematological/Oncological History #Right breast cancer -03/03/2013: Right breast biopsy: T3 N1, stage IIIA invasive ductal carcinoma, grade 3, estrogen receptor 100% positive, progesterone receptor 100% positive, with an MIB-1 of 20% and no HER-2 amplification -03/04/2014: Right axillary lymph node biopsy was negatibe -08/23/2013: Received 6 cycles of neoadjuvant chemotherapy with docetaxel, doxorubicin and cyclophosphamide. -10/25/2013: Underwent right total mastectomy and right axillary sentinel node biopsy. Pathology revealed ypT3 ypN0 invasive lobular carcinoma, again estrogen and progesterone receptor positive and HER-2 nonamplified; with negative margins -12/08/2013-01/17/2014: Adjuvant radiation to right chest wall. 50.4 Gy in 28 fractions.  -03/01/2014: Started tamoxifen with plans to continue for 10 years.   #Monoclonal gammopathy -03/13/2021: SPEP showed M protein measuring 2.1 g/dL, serum IgG measuring 1,914 mg/dL. Immunofixation showed IgG monocloncal protein with lamda light chain specificity. Lambda free light chains elevated at 641.1 with kappa/lamda ratio at 0.03.  -04/22/2021:Kappa free light chains elevated at 21.5 mg/L, Lambda free light chains elevated at 675.6 mg/L with kappa/lamda ratio at 0.03.  -05/21/2021: Establish care with Georga Kaufmann PA-C --Bone survey done on May 24, 2021 showed no evidence of lytic lesions.  Moderate multifocal degenerative changes --Bone marrow aspiration and biopsy done on July 02, 2021 showed normocellular bone marrow for age with plasmacytosis plasma cells 13% plasma cells apparently display lambda light chain excess and very limited staining most suggestive of plasma cell neoplasm  especially in presence of monoclonal protein.  Cytogenetics normal, FISH normal No significant change in labs in December to consider progression.  CHIEF COMPLAINTS/PURPOSE OF CONSULTATION:  Monoclonal gammopathy  HISTORY OF PRESENTING ILLNESS:   SOCORRO KANITZ 72 y.o. female to the clinic to evaluate for monoclonal gammopathy.  Patient is unaccompanied for this visit.Since last visit,she continues on tamoxifen and is able to tolerate it very well. She will continue till Dec 2025, when she will be done with 10 yrs of anti estrogen therapy. She has mammogram scheduled on 11/25 She denies any changes in her breast.  No B symptoms. No new bone pains.  She is here for follow-up on the smoldering myeloma.  She had a question if she can be tested for B12 deficiency.  She otherwise is worried about this blood condition but overall has been doing well. Rest of the pertinent 10 point ROS reviewed and neg.  MEDICAL HISTORY:  Past Medical History:  Diagnosis Date   Anxiety    anxiousabout impending surgery- loss of breast   Arthritis    "joints hurt q now and then" (10/25/2013)   Breast cancer (HCC)    Breast cancer of upper-outer quadrant of right female breast (HCC) 03/10/2013   Depression    history of depression after son died in the 2000's   GERD (gastroesophageal reflux disease)    "just chemo related"   H/O hiatal hernia    Hypertension    Radiation 12/08/13-01/17/14   Right chest wall 50.4 Gy   Spontaneous dislocation of shoulder [phone]   "right; a few times over the years"    SURGICAL HISTORY: Past Surgical History:  Procedure Laterality Date   ABDOMINAL HYSTERECTOMY  1990's   BREAST BIOPSY Right 02/2013   CARDIOVERSION  11/26/2021   Procedure: CARDIOVERSION;  Surgeon: Maisie Fus, MD;  Location: [facility] ENDOSCOPY;  Service: Cardiovascular;;   CESAREAN SECTION  1980  MASTECTOMY COMPLETE / SIMPLE W/ SENTINEL NODE BIOPSY Right 10/25/2013   MASTECTOMY W/ SENTINEL NODE BIOPSY  Right 10/25/2013   Procedure: RIGHT TOTAL MASTECTOMY WITH SENTINEL LYMPH NODE BIOPSY;  Surgeon: Ernestene Mention, MD;  Location: [facility] OR;  Service: General;  Laterality: Right;   MULTIPLE TOOTH EXTRACTIONS Bilateral 2011-2014 X 6   PORT A CATH REVISION Right 04/29/2013   Procedure: PORT A CATH REVISION;  Surgeon: Ernestene Mention, MD;  Location: [facility];  Service: General;  Laterality: Right;   PORT-A-CATH REMOVAL  10/25/2013   PORT-A-CATH REMOVAL N/A 10/25/2013   Procedure: REMOVAL PORT-A-CATH;  Surgeon: Ernestene Mention, MD;  Location: [facility] OR;  Service: General;  Laterality: N/A;   PORTACATH PLACEMENT Right 04/15/2013   Procedure: INSERTION PORT-A-CATH;  Surgeon: Ernestene Mention, MD;  Location: [facility];  Service: General;  Laterality: Right;   TEE WITHOUT CARDIOVERSION N/A 11/26/2021   Procedure: TRANSESOPHAGEAL ECHOCARDIOGRAM (TEE);  Surgeon: Maisie Fus, MD;  Location: [facility] ENDOSCOPY;  Service: Cardiovascular;  Laterality: N/A;   TONSILLECTOMY     WISDOM TOOTH EXTRACTION      SOCIAL HISTORY: Social History   Socioeconomic History   Marital status: Single    Spouse name: Not on file   Number of children: 1   Years of education: Not on file   Highest education level: Not on file  Occupational History   Not on file  Tobacco Use   Smoking status: Former    Current packs/day: 0.00    Average packs/day: 0.3 packs/day for 3.0 years (0.8 ttl pk-yrs)    Types: Cigarettes    Start date: 04/12/1998    Quit date: 04/12/2001    Years since quitting: 21.8   Smokeless tobacco: Never   Tobacco comments:    "casual smoker; pack would go stale on me"  Vaping Use   Vaping status: Never Used  Substance and Sexual Activity   Alcohol use: Yes    Comment: 10/25/2013 "might have a glass of wine a few times/year"   Drug use: No   Sexual activity: Never    Birth control/protection: Surgical  Other Topics Concern   Not on file  Social History Narrative   Not on  file   Social Determinants of Health   Financial Resource Strain: Not on file  Food Insecurity: Not on file  Transportation Needs: Not on file  Physical Activity: Not on file  Stress: Not on file  Social Connections: Not on file  Intimate Partner Violence: Not on file    FAMILY HISTORY: Family History  Problem Relation Age of Onset   Hypertension Mother    Bowel Disease Mother    Lung disease Father    Cancer Father    Heart disease Maternal Uncle    Heart attack Maternal Uncle    Heart disease Maternal Grandmother    Heart disease Maternal Grandfather    Acute lymphoblastic leukemia Son     ALLERGIES:  is allergic to effexor [venlafaxine], ciprofloxacin, and penicillins.  MEDICATIONS:  Current Outpatient Medications  Medication Sig Dispense Refill   apixaban (ELIQUIS) 5 MG TABS tablet Take 1 tablet (5 mg total) by mouth 2 (two) times daily. 180 tablet 0   calcium-vitamin D (OSCAL WITH D) 500-5 MG-MCG tablet Take 1 tablet by mouth daily with breakfast. 30 tablet 6   hydrochlorothiazide (HYDRODIURIL) 25 MG tablet TAKE 1 TABLET(25 MG) BY MOUTH EVERY MORNING 90 tablet 3   losartan (COZAAR) 100 MG tablet Take 1  tablet (100 mg total) by mouth daily. 30 tablet 0   metoprolol tartrate (LOPRESSOR) 25 MG tablet Take 1 tablet (25 mg total) by mouth 2 (two) times daily. 90 tablet 1   Multiple Vitamins-Minerals (MULTIVITAMIN ADULT) CHEW Chew 2 tablets by mouth 2 (two) times a week.     tamoxifen (NOLVADEX) 20 MG tablet Take 1 tablet (20 mg total) by mouth daily. 90 tablet 4   No current facility-administered medications for this visit.    REVIEW OF SYSTEMS:   Constitutional: ( - ) fevers, ( - )  chills , ( - ) night sweats Eyes: ( - ) blurriness of vision, ( - ) double vision, ( - ) watery eyes Ears, nose, mouth, throat, and face: ( - ) mucositis, ( - ) sore throat Respiratory: ( - ) cough, ( - ) dyspnea, ( - ) wheezes Cardiovascular: ( - ) palpitation, ( - ) chest discomfort, ( -  ) lower extremity swelling Gastrointestinal:  ( - ) nausea, ( - ) heartburn, ( - ) change in bowel habits Skin: ( - ) abnormal skin rashes Lymphatics: ( - ) new lymphadenopathy, ( - ) easy bruising Neurological: ( - ) numbness, ( - ) tingling, ( - ) new weaknesses Behavioral/Psych: ( - ) mood change, ( - ) new changes  All other systems were reviewed with the patient and are negative.  PHYSICAL EXAMINATION: ECOG PERFORMANCE STATUS: 1 - Symptomatic but completely ambulatory  Vitals:   02/04/23 1322  BP: (!) 165/76  Pulse: 73  Resp: 16  Temp: 97.8 F (36.6 C)  SpO2: 98%    Filed Weights   02/04/23 1322  Weight: 208 lb 12.8 oz (94.7 kg)    Physical Exam Constitutional:      Appearance: Normal appearance.  Cardiovascular:     Rate and Rhythm: Normal rate and regular rhythm.     Pulses: Normal pulses.     Heart sounds: Normal heart sounds.  Pulmonary:     Effort: Pulmonary effort is normal.     Breath sounds: Normal breath sounds.  Musculoskeletal:     Cervical back: Normal range of motion and neck supple. No rigidity.  Lymphadenopathy:     Cervical: No cervical adenopathy.  Neurological:     Mental Status: She is alert.      LABORATORY DATA:  I have reviewed the data as listed    Latest Ref Rng & Units 12/09/2022    9:31 AM 03/12/2022    9:56 AM 11/26/2021    5:02 AM  CBC  WBC 4.0 - 10.5 K/uL 8.4  7.4  11.8   Hemoglobin 12.0 - 15.0 g/dL 13.2  44.0  10.2   Hematocrit 36.0 - 46.0 % 43.5  39.7  40.1   Platelets 150 - 400 K/uL 272  283  248        Latest Ref Rng & Units 12/09/2022    9:31 AM 11/25/2022    4:29 PM 03/12/2022    9:56 AM  CMP  Glucose 70 - 99 mg/dL 725  83  98   BUN 8 - 23 mg/dL 20  14  13    Creatinine 0.44 - 1.00 mg/dL 3.66  4.40  3.47   Sodium 135 - 145 mmol/L 137  142  138   Potassium 3.5 - 5.1 mmol/L 3.4  4.2  3.7   Chloride 98 - 111 mmol/L 105  106  106   CO2 22 - 32 mmol/L 27  24  29  Calcium 8.9 - 10.3 mg/dL 9.6  9.7  9.8   Total  Protein 6.5 - 8.1 g/dL 9.0   8.3   Total Bilirubin 0.3 - 1.2 mg/dL 0.3   0.3   Alkaline Phos 38 - 126 U/L 49   49   AST 15 - 41 U/L 13   11   ALT 0 - 44 U/L 13   10    RADIOGRAPHIC STUDIES: I have personally reviewed the radiological images as listed and agreed with the findings in the report. No results found.  ASSESSMENT & PLAN MARIANELA MANDRELL is a 72 y.o. 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  Bone survey negative. --Bone marrow biopsy findings reviewed. --Last labs in September with increase in IgG, light chains hence she is here for follow-up and repeat labs.  She will proceed with CBC, CMP, SPEP, kappa lambda chain ratio as well as immunoglobulin levels today.  She will return to clinic in approximately 2 to 3 months for follow-up for the smoldering myeloma.  She is clinically asymptomatic.  # H/O Right breast cancer --Currently on tamoxifen that she started on 03/01/2014 with plans to continue for 10 years.  --No concerns for breast cancer recurrence.  Mammogram scheduled for November. -We have previously evaluated the right lower extremity swelling and there was no evidence of DVT in the lower extremity.  There has not been any change in the swelling.  No orders of the defined types were placed in this encounter.   All questions were answered. The patient knows to call the clinic with any problems, questions or concerns.  I have spent a total of 30 minutes minutes of face-to-face and non-face-to-face time, preparing to see the patient, obtaining and/or reviewing separately obtained history, performing a medically appropriate examination, counseling and educating the patient, ordering tests/procedures, documenting clinical information in the electronic health record,  and care coordination.

## 2023-02-05 LAB — IGG, IGA, IGM
IgA: 132 mg/dL (ref 64–422)
IgG (Immunoglobin G), Serum: 3609 mg/dL — ABNORMAL HIGH (ref 586–1602)
IgM (Immunoglobulin M), Srm: 60 mg/dL (ref 26–217)

## 2023-02-05 LAB — KAPPA/LAMBDA LIGHT CHAINS
Kappa free light chain: 14.3 mg/L (ref 3.3–19.4)
Kappa, lambda light chain ratio: 0.02 — ABNORMAL LOW (ref 0.26–1.65)
Lambda free light chains: 759 mg/L — ABNORMAL HIGH (ref 5.7–26.3)

## 2023-02-10 LAB — IMMUNOFIXATION REFLEX, SERUM
IgA: 145 mg/dL (ref 64–422)
IgG (Immunoglobin G), Serum: 4112 mg/dL — ABNORMAL HIGH (ref 586–1602)
IgM (Immunoglobulin M), Srm: 70 mg/dL (ref 26–217)

## 2023-02-10 LAB — PROTEIN ELECTROPHORESIS, SERUM, WITH REFLEX
A/G Ratio: 0.8 (ref 0.7–1.7)
Albumin ELP: 3.7 g/dL (ref 2.9–4.4)
Alpha-1-Globulin: 0.3 g/dL (ref 0.0–0.4)
Alpha-2-Globulin: 0.8 g/dL (ref 0.4–1.0)
Beta Globulin: 1 g/dL (ref 0.7–1.3)
Gamma Globulin: 2.6 g/dL — ABNORMAL HIGH (ref 0.4–1.8)
Globulin, Total: 4.6 g/dL — ABNORMAL HIGH (ref 2.2–3.9)
M-Spike, %: 2.2 g/dL — ABNORMAL HIGH
SPEP Interpretation: 0
Total Protein ELP: 8.3 g/dL (ref 6.0–8.5)

## 2023-02-23 ENCOUNTER — Ambulatory Visit
Admission: RE | Admit: 2023-02-23 | Discharge: 2023-02-23 | Disposition: A | Discharge status: Home / Self Care | Payer: Medicare HMO | Source: Ambulatory Visit | Attending: Hematology and Oncology | Admitting: Hematology and Oncology

## 2023-02-23 DIAGNOSIS — C50411 Malignant neoplasm of upper-outer quadrant of right female breast: Secondary | ICD-10-CM

## 2023-02-23 DIAGNOSIS — Z1231 Encounter for screening mammogram for malignant neoplasm of breast: Secondary | ICD-10-CM | POA: Diagnosis not present

## 2023-02-23 DIAGNOSIS — Z170 Estrogen receptor positive status [ER+]: Secondary | ICD-10-CM

## 2023-02-23 DIAGNOSIS — D472 Monoclonal gammopathy: Secondary | ICD-10-CM

## 2023-02-23 HISTORY — DX: Personal history of irradiation: Z92.3

## 2023-02-23 HISTORY — DX: Personal history of antineoplastic chemotherapy: Z92.21

## 2023-04-11 DIAGNOSIS — F411 Generalized anxiety disorder: Secondary | ICD-10-CM | POA: Diagnosis not present

## 2023-04-16 DIAGNOSIS — F411 Generalized anxiety disorder: Secondary | ICD-10-CM | POA: Diagnosis not present

## 2023-04-27 DIAGNOSIS — L630 Alopecia (capitis) totalis: Secondary | ICD-10-CM | POA: Diagnosis not present

## 2023-04-28 ENCOUNTER — Other Ambulatory Visit: Payer: Medicare HMO

## 2023-04-28 ENCOUNTER — Ambulatory Visit: Payer: Medicare HMO | Admitting: Hematology and Oncology

## 2023-05-05 ENCOUNTER — Inpatient Hospital Stay: Payer: Medicare HMO | Admitting: Hematology and Oncology

## 2023-05-05 ENCOUNTER — Inpatient Hospital Stay: Payer: Medicare HMO | Attending: Hematology and Oncology

## 2023-05-05 ENCOUNTER — Encounter: Payer: Self-pay | Admitting: Hematology and Oncology

## 2023-05-05 VITALS — BP 131/64 | HR 61 | Temp 98.0°F | Resp 16 | Wt 210.6 lb

## 2023-05-05 DIAGNOSIS — Z79899 Other long term (current) drug therapy: Secondary | ICD-10-CM | POA: Insufficient documentation

## 2023-05-05 DIAGNOSIS — C50411 Malignant neoplasm of upper-outer quadrant of right female breast: Secondary | ICD-10-CM | POA: Diagnosis not present

## 2023-05-05 DIAGNOSIS — Z9221 Personal history of antineoplastic chemotherapy: Secondary | ICD-10-CM | POA: Insufficient documentation

## 2023-05-05 DIAGNOSIS — D472 Monoclonal gammopathy: Secondary | ICD-10-CM | POA: Diagnosis not present

## 2023-05-05 DIAGNOSIS — Z1721 Progesterone receptor positive status: Secondary | ICD-10-CM | POA: Insufficient documentation

## 2023-05-05 DIAGNOSIS — Z79810 Long term (current) use of selective estrogen receptor modulators (SERMs): Secondary | ICD-10-CM | POA: Diagnosis not present

## 2023-05-05 DIAGNOSIS — Z170 Estrogen receptor positive status [ER+]: Secondary | ICD-10-CM | POA: Insufficient documentation

## 2023-05-05 LAB — CBC WITH DIFFERENTIAL/PLATELET
Abs Immature Granulocytes: 0.02 10*3/uL (ref 0.00–0.07)
Basophils Absolute: 0.1 10*3/uL (ref 0.0–0.1)
Basophils Relative: 1 %
Eosinophils Absolute: 0.3 10*3/uL (ref 0.0–0.5)
Eosinophils Relative: 5 %
HCT: 42 % (ref 36.0–46.0)
Hemoglobin: 13.7 g/dL (ref 12.0–15.0)
Immature Granulocytes: 0 %
Lymphocytes Relative: 35 %
Lymphs Abs: 2.3 10*3/uL (ref 0.7–4.0)
MCH: 27.9 pg (ref 26.0–34.0)
MCHC: 32.6 g/dL (ref 30.0–36.0)
MCV: 85.5 fL (ref 80.0–100.0)
Monocytes Absolute: 0.6 10*3/uL (ref 0.1–1.0)
Monocytes Relative: 9 %
Neutro Abs: 3.3 10*3/uL (ref 1.7–7.7)
Neutrophils Relative %: 50 %
Platelets: 273 10*3/uL (ref 150–400)
RBC: 4.91 MIL/uL (ref 3.87–5.11)
RDW: 14.5 % (ref 11.5–15.5)
WBC: 6.7 10*3/uL (ref 4.0–10.5)
nRBC: 0 % (ref 0.0–0.2)

## 2023-05-05 NOTE — Progress Notes (Signed)
 [facility] [phone]) [phone]   [phone]  INITIAL CONSULT NOTE  Patient Care Team: Elliot Charm, MD as PCP - General (Internal Medicine) Rogelio Planas, MD as Consulting Physician (Obstetrics and Gynecology)  Hematological/Oncological History #Right breast cancer -03/03/2013: Right breast biopsy: T3 N1, stage IIIA invasive ductal carcinoma, grade 3, estrogen receptor 100% positive, progesterone receptor 100% positive, with an MIB-1 of 20% and no HER-2 amplification -03/04/2014: Right axillary lymph node biopsy was negatibe -08/23/2013: Received 6 cycles of neoadjuvant chemotherapy with docetaxel , doxorubicin  and cyclophosphamide . -10/25/2013: Underwent right total mastectomy and right axillary sentinel node biopsy. Pathology revealed ypT3 ypN0 invasive lobular carcinoma, again estrogen and progesterone receptor positive and HER-2 nonamplified; with negative margins -12/08/2013-01/17/2014: Adjuvant radiation to right chest wall. 50.4 Gy in 28 fractions.  -03/01/2014: Started tamoxifen  with plans to continue for 10 years.   #Monoclonal gammopathy -03/13/2021: SPEP showed M protein measuring 2.1 g/dL, serum IgG measuring 6,557 mg/dL. Immunofixation showed IgG monocloncal protein with lamda light chain specificity. Lambda free light chains elevated at 641.1 with kappa/lamda ratio at 0.03.  -04/22/2021:Kappa free light chains elevated at 21.5 mg/L, Lambda free light chains elevated at 675.6 mg/L with kappa/lamda ratio at 0.03.  -05/21/2021: Establish care with Johnston Police PA-C --Bone survey done on May 24, 2021 showed no evidence of lytic lesions.  Moderate multifocal degenerative changes --Bone marrow aspiration and biopsy done on July 02, 2021 showed normocellular bone marrow for age with plasmacytosis plasma cells 13% plasma cells apparently display lambda light chain excess and very limited staining most suggestive of plasma cell neoplasm  especially in presence of monoclonal protein.  Cytogenetics normal, FISH normal No significant change in labs in December to consider progression.  CHIEF COMPLAINTS/PURPOSE OF CONSULTATION:  Monoclonal gammopathy  HISTORY OF PRESENTING ILLNESS:  Discussed the use of AI scribe software for clinical note transcription with the patient, who gave verbal consent to proceed.  History of Present Illness    Denise Becker is a 73 year old female with breast cancer and smoldering multiple myeloma who presents for follow-up and lab work.  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 No symptoms such as fevers, night sweats, weight loss, or new bone pain are present. Previous bone marrow biopsy and scans showed no bone lesions or significant findings to diagnose full-blown myeloma.  No new symptoms related to her breast cancer are reported, and her recent mammogram in November showed no concerning findings. She experiences occasional sharp pain in the area of her mastectomy, described as a 'catch'.  In terms of her general health, she reports occasional sleep disturbances, waking up at night to urinate, but this is not consistent. No trouble urinating otherwise. Some swelling in her ankles is noted, which she states is normal for her.  Rest of the pertinent 10 point ROS reviewed and neg.  MEDICAL HISTORY:  Past Medical History:  Diagnosis Date   Anxiety    anxiousabout impending surgery- loss of breast   Arthritis    joints hurt q now and then (10/25/2013)   Breast cancer (HCC)    Breast cancer of upper-outer quadrant of right female breast (HCC) 03/10/2013   Depression    history of depression after son died in the 2000's   GERD (gastroesophageal reflux disease)    just chemo related   H/O hiatal hernia    Hypertension    Personal history of chemotherapy    Personal history of  radiation therapy    Radiation  12/08/13-01/17/14   Right chest wall 50.4 Gy   Spontaneous dislocation of shoulder [phone]   right; a few times over the years    SURGICAL HISTORY: Past Surgical History:  Procedure Laterality Date   ABDOMINAL HYSTERECTOMY  1990's   BREAST BIOPSY Right 02/2013   CARDIOVERSION  11/26/2021   Procedure: CARDIOVERSION;  Surgeon: Alvan Ronal BRAVO, MD;  Location: [facility] ENDOSCOPY;  Service: Cardiovascular;;   CESAREAN SECTION  1980   MASTECTOMY COMPLETE / SIMPLE W/ SENTINEL NODE BIOPSY Right 10/25/2013   MASTECTOMY W/ SENTINEL NODE BIOPSY Right 10/25/2013   Procedure: RIGHT TOTAL MASTECTOMY WITH SENTINEL LYMPH NODE BIOPSY;  Surgeon: Elon CHRISTELLA Pacini, MD;  Location: [facility] OR;  Service: General;  Laterality: Right;   MULTIPLE TOOTH EXTRACTIONS Bilateral 2011-2014 X 6   PORT A CATH REVISION Right 04/29/2013   Procedure: PORT A CATH REVISION;  Surgeon: Elon CHRISTELLA Pacini, MD;  Location: [facility];  Service: General;  Laterality: Right;   PORT-A-CATH REMOVAL  10/25/2013   PORT-A-CATH REMOVAL N/A 10/25/2013   Procedure: REMOVAL PORT-A-CATH;  Surgeon: Elon CHRISTELLA Pacini, MD;  Location: [facility] OR;  Service: General;  Laterality: N/A;   PORTACATH PLACEMENT Right 04/15/2013   Procedure: INSERTION PORT-A-CATH;  Surgeon: Elon CHRISTELLA Pacini, MD;  Location: [facility];  Service: General;  Laterality: Right;   TEE WITHOUT CARDIOVERSION N/A 11/26/2021   Procedure: TRANSESOPHAGEAL ECHOCARDIOGRAM (TEE);  Surgeon: Alvan Ronal BRAVO, MD;  Location: [facility] ENDOSCOPY;  Service: Cardiovascular;  Laterality: N/A;   TONSILLECTOMY     WISDOM TOOTH EXTRACTION      SOCIAL HISTORY: Social History   Socioeconomic History   Marital status: Single    Spouse name: Not on file   Number of children: 1   Years of education: Not on file   Highest education level: Not on file  Occupational History   Not on file  Tobacco Use   Smoking status: Former    Current packs/day: 0.00    Average packs/day: 0.3 packs/day  for 3.0 years (0.8 ttl pk-yrs)    Types: Cigarettes    Start date: 04/12/1998    Quit date: 04/12/2001    Years since quitting: 22.0   Smokeless tobacco: Never   Tobacco comments:    casual smoker; pack would go stale on me  Vaping Use   Vaping status: Never Used  Substance and Sexual Activity   Alcohol use: Yes    Comment: 10/25/2013 might have a glass of wine a few times/year   Drug use: No   Sexual activity: Never    Birth control/protection: Surgical  Other Topics Concern   Not on file  Social History Narrative   Not on file   Social Drivers of Health   Financial Resource Strain: Not on file  Food Insecurity: Not on file  Transportation Needs: Not on file  Physical Activity: Not on file  Stress: Not on file  Social Connections: Not on file  Intimate Partner Violence: Not on file    FAMILY HISTORY: Family History  Problem Relation Age of Onset   Hypertension Mother    Bowel Disease Mother    Lung disease Father    Cancer Father    Breast cancer Maternal Aunt 83   Heart disease Maternal Uncle    Heart attack Maternal Uncle    Heart disease Maternal Grandmother    Heart disease Maternal Grandfather    Acute lymphoblastic leukemia Son     ALLERGIES:  is allergic to effexor  [venlafaxine ], ciprofloxacin , and penicillins.  MEDICATIONS:  Current Outpatient Medications  Medication Sig Dispense Refill   apixaban  (ELIQUIS ) 5 MG TABS tablet Take 1 tablet (5 mg total) by mouth 2 (two) times daily. 180 tablet 0   calcium -vitamin D  (OSCAL WITH D) 500-5 MG-MCG tablet Take 1 tablet by mouth daily with breakfast. 30 tablet 6   hydrochlorothiazide  (HYDRODIURIL ) 25 MG tablet TAKE 1 TABLET(25 MG) BY MOUTH EVERY MORNING 90 tablet 3   losartan  (COZAAR ) 100 MG tablet Take 1 tablet (100 mg total) by mouth daily. 30 tablet 0   metoprolol  tartrate (LOPRESSOR ) 25 MG tablet Take 1 tablet (25 mg total) by mouth 2 (two) times daily. 90 tablet 1   Multiple Vitamins-Minerals  (MULTIVITAMIN ADULT) CHEW Chew 2 tablets by mouth 2 (two) times a week.     tamoxifen  (NOLVADEX ) 20 MG tablet Take 1 tablet (20 mg total) by mouth daily. 90 tablet 4   No current facility-administered medications for this visit.    REVIEW OF SYSTEMS:   Constitutional: ( - ) fevers, ( - )  chills , ( - ) night sweats Eyes: ( - ) blurriness of vision, ( - ) double vision, ( - ) watery eyes Ears, nose, mouth, throat, and face: ( - ) mucositis, ( - ) sore throat Respiratory: ( - ) cough, ( - ) dyspnea, ( - ) wheezes Cardiovascular: ( - ) palpitation, ( - ) chest discomfort, ( - ) lower extremity swelling Gastrointestinal:  ( - ) nausea, ( - ) heartburn, ( - ) change in bowel habits Skin: ( - ) abnormal skin rashes Lymphatics: ( - ) new lymphadenopathy, ( - ) easy bruising Neurological: ( - ) numbness, ( - ) tingling, ( - ) new weaknesses Behavioral/Psych: ( - ) mood change, ( - ) new changes  All other systems were reviewed with the patient and are negative.  PHYSICAL EXAMINATION: ECOG PERFORMANCE STATUS: 1 - Symptomatic but completely ambulatory  Vitals:   05/05/23 1106  BP: 131/64  Pulse: 61  Resp: 16  Temp: 98 F (36.7 C)  SpO2: 98%    Filed Weights   05/05/23 1106  Weight: 210 lb 9.6 oz (95.5 kg)    Physical Exam Constitutional:      Appearance: Normal appearance.  Cardiovascular:     Rate and Rhythm: Normal rate and regular rhythm.     Pulses: Normal pulses.     Heart sounds: Normal heart sounds.  Pulmonary:     Effort: Pulmonary effort is normal.     Breath sounds: Normal breath sounds.  Musculoskeletal:     Cervical back: Normal range of motion and neck supple. No rigidity.  Lymphadenopathy:     Cervical: No cervical adenopathy.  Neurological:     Mental Status: She is alert.     LABORATORY DATA:  I have reviewed the data as listed    Latest Ref Rng & Units 05/05/2023   10:42 AM 02/04/2023    2:27 PM 12/09/2022    9:31 AM  CBC  WBC 4.0 - 10.5 K/uL 6.7   7.8  8.4   Hemoglobin 12.0 - 15.0 g/dL 86.2  86.9  85.5   Hematocrit 36.0 - 46.0 % 42.0  39.5  43.5   Platelets 150 - 400 K/uL 273  258  272        Latest Ref Rng & Units 02/04/2023    2:27 PM 12/09/2022    9:31 AM 11/25/2022  4:29 PM  CMP  Glucose 70 - 99 mg/dL 93  897  83   BUN 8 - 23 mg/dL 15  20  14    Creatinine 0.44 - 1.00 mg/dL 8.95  9.11  9.05   Sodium 135 - 145 mmol/L 139  137  142   Potassium 3.5 - 5.1 mmol/L 3.5  3.4  4.2   Chloride 98 - 111 mmol/L 103  105  106   CO2 22 - 32 mmol/L 32  27  24   Calcium  8.9 - 10.3 mg/dL 89.8  9.6  9.7   Total Protein 6.5 - 8.1 g/dL 8.7  9.0    Total Bilirubin <1.2 mg/dL 0.4  0.3    Alkaline Phos 38 - 126 U/L 43  49    AST 15 - 41 U/L 13  13    ALT 0 - 44 U/L 12  13     RADIOGRAPHIC STUDIES: I have personally reviewed the radiological images as listed and agreed with the findings in the report. No results found.  ASSESSMENT & PLAN Denise Becker is a 73 y.o. who presents to the clinic for follow up of smoldering MM and right sided BC  #Smoldering MM --It appears she has smoldering multiple myeloma without any evidence of crab criteria.  Bone survey negative. --Bone marrow biopsy findings reviewed. --Stable monoclonal protein levels. No symptoms of progression to multiple myeloma. Discussed potential future treatment with Revlimid and steroids if progression occurs. -Continue monitoring with quarterly labs. -Plan for future treatment with Revlimid and steroids if progression to multiple myeloma occurs.  # H/O Right breast cancer --Currently on tamoxifen  that she started on 03/01/2014 with plans to continue for 10 years.  --No concerns for breast cancer recurrence.  Mammogram scheduled for November. On Tamoxifen  for 10 years, to be discontinued in December 2025.  -Monitor for any new symptoms or concerns.  Post-Mastectomy Experiencing occasional sharp pain in the area of mastectomy, likely due to muscle cramping. -Continue  monitoring for any changes or worsening of pain.  She doesn't want to go back for labs today, she will come in 2 weeks with repeat labs for [facility], everything has been ordered.  No orders of the defined types were placed in this encounter.   All questions were answered. The patient knows to call the clinic with any problems, questions or concerns.  I have spent a total of 30 minutes minutes of face-to-face and non-face-to-face time, preparing to see the patient, obtaining and/or reviewing separately obtained history, performing a medically appropriate examination, counseling and educating the patient, ordering tests/procedures, documenting clinical information in the electronic health record,  and care coordination.

## 2023-05-19 ENCOUNTER — Inpatient Hospital Stay: Payer: Medicare HMO

## 2023-05-19 DIAGNOSIS — Z79810 Long term (current) use of selective estrogen receptor modulators (SERMs): Secondary | ICD-10-CM | POA: Diagnosis not present

## 2023-05-19 DIAGNOSIS — C50411 Malignant neoplasm of upper-outer quadrant of right female breast: Secondary | ICD-10-CM | POA: Diagnosis not present

## 2023-05-19 DIAGNOSIS — D472 Monoclonal gammopathy: Secondary | ICD-10-CM | POA: Diagnosis not present

## 2023-05-19 DIAGNOSIS — Z1721 Progesterone receptor positive status: Secondary | ICD-10-CM | POA: Diagnosis not present

## 2023-05-19 DIAGNOSIS — Z9221 Personal history of antineoplastic chemotherapy: Secondary | ICD-10-CM | POA: Diagnosis not present

## 2023-05-19 DIAGNOSIS — Z79899 Other long term (current) drug therapy: Secondary | ICD-10-CM | POA: Diagnosis not present

## 2023-05-19 DIAGNOSIS — Z170 Estrogen receptor positive status [ER+]: Secondary | ICD-10-CM | POA: Diagnosis not present

## 2023-05-19 LAB — CBC WITH DIFFERENTIAL/PLATELET
Abs Immature Granulocytes: 0.01 10*3/uL (ref 0.00–0.07)
Basophils Absolute: 0.1 10*3/uL (ref 0.0–0.1)
Basophils Relative: 1 %
Eosinophils Absolute: 0.3 10*3/uL (ref 0.0–0.5)
Eosinophils Relative: 5 %
HCT: 41.9 % (ref 36.0–46.0)
Hemoglobin: 13.7 g/dL (ref 12.0–15.0)
Immature Granulocytes: 0 %
Lymphocytes Relative: 43 %
Lymphs Abs: 3.1 10*3/uL (ref 0.7–4.0)
MCH: 28.1 pg (ref 26.0–34.0)
MCHC: 32.7 g/dL (ref 30.0–36.0)
MCV: 85.9 fL (ref 80.0–100.0)
Monocytes Absolute: 0.6 10*3/uL (ref 0.1–1.0)
Monocytes Relative: 8 %
Neutro Abs: 3.1 10*3/uL (ref 1.7–7.7)
Neutrophils Relative %: 43 %
Platelets: 264 10*3/uL (ref 150–400)
RBC: 4.88 MIL/uL (ref 3.87–5.11)
RDW: 14.2 % (ref 11.5–15.5)
WBC: 7.2 10*3/uL (ref 4.0–10.5)
nRBC: 0 % (ref 0.0–0.2)

## 2023-05-19 LAB — CMP (CANCER CENTER ONLY)
ALT: 11 U/L (ref 0–44)
AST: 11 U/L — ABNORMAL LOW (ref 15–41)
Albumin: 3.8 g/dL (ref 3.5–5.0)
Alkaline Phosphatase: 48 U/L (ref 38–126)
Anion gap: 5 (ref 5–15)
BUN: 13 mg/dL (ref 8–23)
CO2: 29 mmol/L (ref 22–32)
Calcium: 9.6 mg/dL (ref 8.9–10.3)
Chloride: 104 mmol/L (ref 98–111)
Creatinine: 0.88 mg/dL (ref 0.44–1.00)
GFR, Estimated: >60 mL/min (ref >60)
Glucose, Bld: 124 mg/dL — ABNORMAL HIGH (ref 70–99)
Potassium: 3.2 mmol/L — ABNORMAL LOW (ref 3.5–5.1)
Sodium: 138 mmol/L (ref 135–145)
Total Bilirubin: 0.3 mg/dL (ref 0.0–1.2)
Total Protein: 8.6 g/dL — ABNORMAL HIGH (ref 6.5–8.1)

## 2023-05-20 LAB — KAPPA/LAMBDA LIGHT CHAINS
Kappa free light chain: 13.2 mg/L (ref 3.3–19.4)
Kappa, lambda light chain ratio: 0.02 — ABNORMAL LOW (ref 0.26–1.65)
Lambda free light chains: 754.2 mg/L — ABNORMAL HIGH (ref 5.7–26.3)

## 2023-05-20 LAB — IGG, IGA, IGM
IgA: 119 mg/dL (ref 64–422)
IgG (Immunoglobin G), Serum: 3483 mg/dL — ABNORMAL HIGH (ref 586–1602)
IgM (Immunoglobulin M), Srm: 57 mg/dL (ref 26–217)

## 2023-05-20 LAB — BETA 2 MICROGLOBULIN, SERUM: Beta-2 Microglobulin: 2.1 mg/L (ref 0.6–2.4)

## 2023-05-28 LAB — IMMUNOFIXATION REFLEX, SERUM
IgA: 120 mg/dL (ref 64–422)
IgG (Immunoglobin G), Serum: 3558 mg/dL — ABNORMAL HIGH (ref 586–1602)
IgM (Immunoglobulin M), Srm: 57 mg/dL (ref 26–217)

## 2023-05-28 LAB — PROTEIN ELECTROPHORESIS, SERUM, WITH REFLEX
A/G Ratio: 0.7 (ref 0.7–1.7)
Albumin ELP: 3.6 g/dL (ref 2.9–4.4)
Alpha-1-Globulin: 0.3 g/dL (ref 0.0–0.4)
Alpha-2-Globulin: 0.8 g/dL (ref 0.4–1.0)
Beta Globulin: 1.1 g/dL (ref 0.7–1.3)
Gamma Globulin: 2.7 g/dL — ABNORMAL HIGH (ref 0.4–1.8)
Globulin, Total: 4.9 g/dL — ABNORMAL HIGH (ref 2.2–3.9)
M-Spike, %: 2.3 g/dL — ABNORMAL HIGH
SPEP Interpretation: 0
Total Protein ELP: 8.5 g/dL (ref 6.0–8.5)

## 2023-06-04 ENCOUNTER — Ambulatory Visit: Payer: Medicare HMO | Attending: Cardiology | Admitting: Cardiology

## 2023-06-09 ENCOUNTER — Ambulatory Visit: Payer: Medicare HMO | Admitting: Hematology and Oncology

## 2023-06-12 ENCOUNTER — Ambulatory Visit: Admitting: Cardiology

## 2023-06-18 ENCOUNTER — Telehealth: Payer: Self-pay

## 2023-06-18 ENCOUNTER — Ambulatory Visit (INDEPENDENT_AMBULATORY_CARE_PROVIDER_SITE_OTHER): Payer: Medicare HMO | Admitting: Internal Medicine

## 2023-06-18 ENCOUNTER — Encounter: Payer: Self-pay | Admitting: Internal Medicine

## 2023-06-18 VITALS — BP 140/88 | HR 76 | Ht 61.0 in | Wt 204.0 lb

## 2023-06-18 DIAGNOSIS — K219 Gastro-esophageal reflux disease without esophagitis: Secondary | ICD-10-CM

## 2023-06-18 DIAGNOSIS — R198 Other specified symptoms and signs involving the digestive system and abdomen: Secondary | ICD-10-CM

## 2023-06-18 DIAGNOSIS — Z1211 Encounter for screening for malignant neoplasm of colon: Secondary | ICD-10-CM

## 2023-06-18 DIAGNOSIS — R109 Unspecified abdominal pain: Secondary | ICD-10-CM

## 2023-06-18 MED ORDER — NA SULFATE-K SULFATE-MG SULF 17.5-3.13-1.6 GM/177ML PO SOLN: ORAL | 0 refills | Status: DC

## 2023-06-18 NOTE — Telephone Encounter (Signed)
 [facility] Pre-operative Risk Assessment     Request for surgical clearance:     Endoscopy Procedure  What type of surgery is being performed?     Colonoscopy  When is this surgery scheduled?     07/22/23   What type of clearance is required ?   Pharmacy  Are there any medications that need to be held prior to surgery and how long? Eliquis 2 days hold  Practice name and name of physician performing surgery?      [facility]  What is your office phone and fax number?      Phone- [phone]  Fax- [phone]  Anesthesia type (None, local, MAC, general) ?       MAC   Please route your response to [facility]

## 2023-06-18 NOTE — Progress Notes (Signed)
 06/18/2023 Denise Becker [id number] October 01, 1950   ASSESSMENT AND PLAN:   Colon cancer screening GERD, well controlled Epigastric ab pain, depends on what she eats Patient overall seems to be doing better in terms of her GI symptoms.  She is primarily here to discuss colon cancer screening.  I went over the risks and benefits of the colonoscopy procedure with the patient, and she is agreeable to proceeding.  She has never had a colonoscopy in the past.  Patient's acid reflux is currently well-controlled and her epigastric abdominal pain tends to depend on what she eats.  Thus we will give her some information on a low FODMAP diet to see if this helps identify some food triggers for her abdominal pain. - Low FODMAP diet - Colonoscopy [facility]. Will need Eliquis held 2 days before her procedure. Will contact Dr. Odis Hollingshead to make it is okay to hold blood thinner. Will likely need an abdominal binder - Could consider EGD in the future if epigastric abdominal pain persists  Patient Care Team: Lorenda Ishihara, MD as PCP - General (Internal Medicine) Tessa Lerner, DO as PCP - Cardiology (Cardiology) Antionette Char, MD as Consulting Physician (Obstetrics and Gynecology)  HISTORY OF PRESENT ILLNESS: 73 y.o. 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 Her PCP recommended that she get a colonoscopy for colon cancer screening. Her stomach makes loud noises. When she eats, sometimes she will get nauseated. After she would throw up, she would feel better. Probiotics helped with her nausea so she no longer throws up. Denies dysphagia currently. She denies any acid reflux  while she is on the Protonix 20 mg QD. She does not have ab pain very often. Sometimes certain foods seem to cause some ab pain. The pain is located in the epigastric area. Denies blood in the stools. Denies diarrhea. She has occasional constipation at times. She has one BM every day. She does take fiber to help with her constipation. Denies changes in bowel habits. Denies consistent weight loss. Weight is up and down.   External labs and notes reviewed this visit: Labs 05/2023: CBC nml. CMP with low K of 3.2.   CT c/a/p w/o contrast 11/21/21: IMPRESSION: No acute findings in the chest, abdomen or pelvis. Bibasilar atelectasis. Large midline ventral hernia containing distal stomach, much of the small bowel, right colon and transverse colon. No bowel obstruction. Aortic atherosclerosis. Sigmoid diverticulosis.  Current Medications:    Current Outpatient Medications (Cardiovascular):    hydrochlorothiazide (HYDRODIURIL) 25 MG tablet, TAKE 1 TABLET(25 MG) BY MOUTH EVERY MORNING   metoprolol tartrate (LOPRESSOR) 25 MG tablet, Take 1 tablet (25 mg total) by mouth 2 (two) times daily.   losartan (COZAAR) 100 MG tablet, Take 1 tablet (100 mg total) by mouth daily.    Current Outpatient Medications (Hematological):    apixaban (ELIQUIS) 5 MG TABS tablet, Take 1 tablet (5 mg total) by mouth 2 (two) times daily.  Current Outpatient Medications (Other):    calcium-vitamin D (OSCAL WITH D) 500-5 MG-MCG tablet, Take 1 tablet by mouth daily with breakfast.   Multiple Vitamins-Minerals (MULTIVITAMIN ADULT) CHEW, Chew 2 tablets by mouth 2 (two) times a week.   tamoxifen (NOLVADEX) 20 MG  tablet, Take 1 tablet (20 mg total) by mouth daily.  Medical History:  Past Medical History:  Diagnosis Date   Anxiety    anxiousabout impending surgery- loss of breast   Arthritis    "joints hurt q now and then" (10/25/2013)   Breast cancer (HCC)    Breast cancer of upper-outer quadrant of right female breast  (HCC) 03/10/2013   Depression    history of depression after son died in the 2000's   GERD (gastroesophageal reflux disease)    "just chemo related"   H/O hiatal hernia    Hypertension    Personal history of chemotherapy    Personal history of radiation therapy    Radiation 12/08/13-01/17/14   Right chest wall 50.4 Gy   Spontaneous dislocation of shoulder [phone]   "right; a few times over the years"   Allergies:  Allergies  Allergen Reactions   Effexor [Venlafaxine] Other (See Comments)    Made her nervous   Ciprofloxacin Diarrhea    Patient developed severe diarrhea on cipro   Penicillins Rash    Did it involve swelling of the face/tongue/throat, SOB, or low BP? No Did it involve sudden or severe rash/hives, skin peeling, or any reaction on the inside of your mouth or nose? Yes Did you need to seek medical attention at a hospital or doctor's office? No When did it last happen? Several Years Ago       If all above answers are "NO", may proceed with cephalosporin use.       Surgical History:  She  has a past surgical history that includes Multiple tooth extractions (Bilateral, 2011-2014 X 6); Tonsillectomy; Cesarean section (1980); Portacath placement (Right, 04/15/2013); Port a cath revision (Right, 04/29/2013); Mastectomy complete / simple w/ sentinel node biopsy (Right, 10/25/2013); Port-a-cath removal (10/25/2013); Wisdom tooth extraction; Abdominal hysterectomy (1990's); Breast biopsy (Right, 02/2013); Mastectomy w/ sentinel node biopsy (Right, 10/25/2013); Port-a-cath removal (N/A, 10/25/2013); TEE without cardioversion (N/A, 11/26/2021); and Cardioversion (11/26/2021). Family History:  Her family history includes Acute lymphoblastic leukemia in her son; Bowel Disease in her mother; Breast cancer (age of onset: 41) in her maternal aunt; Cancer in her father; Heart attack in her maternal uncle; Heart disease in her maternal grandfather, maternal grandmother, and maternal uncle;  Hypertension in her mother; Lung disease in her father. Social History:   reports that she quit smoking about 22 years ago. Her smoking use included cigarettes. She started smoking about 25 years ago. She has a 0.8 pack-year smoking history. She has never used smokeless tobacco. She reports current alcohol use. She reports that she does not use drugs.  REVIEW OF SYSTEMS  : All other systems reviewed and negative except where noted in the History of Present Illness.   PHYSICAL EXAM: BP (!) 140/88   Pulse 76   Ht 5\' 1"  (1.549 m)   Wt 204 lb (92.5 kg)   BMI 38.55 kg/m  General:   Pleasant, well developed female in no acute distress Head:  Normocephalic and atraumatic. Eyes: sclerae anicteric,conjunctive pink  Heart:  regular rate and rhythm Pulm: Clear anteriorly; no wheezing Abdomen:   Soft, Obese AB, Active bowel sounds. Midline hernia present Extremities:  Without edema.  Neurologic:  Alert and  oriented x4;  No focal deficits.  Skin:   Dry and intact without significant lesions or rashes. Psychiatric: Cooperative. Normal mood and affect.  Imogene Burn, MD 10:42 AM  I spent 34 minutes of time, including in depth chart review, independent review of  results as outlined above, communicating results with the patient directly, face-to-face time with the patient, coordinating care, and ordering studies and medications as appropriate, and documentation.

## 2023-06-18 NOTE — Patient Instructions (Addendum)
 You have been scheduled for a colonoscopy. Please follow written instructions given to you at your visit today.   If you use inhalers (even only as needed), please bring them with you on the day of your procedure.  DO NOT TAKE 7 DAYS PRIOR TO TEST- Trulicity (dulaglutide) Ozempic, Wegovy (semaglutide) Mounjaro (tirzepatide) Bydureon Bcise (exanatide extended release)  DO NOT TAKE 1 DAY PRIOR TO YOUR TEST Rybelsus (semaglutide) Adlyxin (lixisenatide) Victoza (liraglutide) Byetta (exanatide)  We have sent the following medications to your pharmacy for you to pick up at your convenience: Suprep   You will be contacted by our office prior to your procedure for directions on holding your Eliquis.  If you do not hear from our office 1 week prior to your scheduled procedure, please call [phone] to discuss.  If your blood pressure at your visit was 140/90 or greater, please contact your primary care physician to follow up on this.  _______________________________________________________  If you are age 8 or older, your body mass index should be between 23-30. Your Body mass index is 38.55 kg/m. If this is out of the aforementioned range listed, please consider follow up with your Primary Care Provider.  If you are age 87 or younger, your body mass index should be between 19-25. Your Body mass index is 38.55 kg/m. If this is out of the aformentioned range listed, please consider follow up with your Primary Care Provider.   ________________________________________________________  The [facility] providers would like to encourage you to use [facility] to communicate with providers for non-urgent requests or questions.  Due to long hold times on the telephone, sending your provider a message by [facility] may be a faster and more efficient way to get a response.  Please allow 48 business hours for a response.  Please remember that this is for non-urgent requests.   _______________________________________________________  Due to recent changes in healthcare laws, you may see the results of your imaging and laboratory studies on MyChart before your provider has had a chance to review them.  We understand that in some cases there may be results that are confusing or concerning to you. Not all laboratory results come back in the same time frame and the provider may be waiting for multiple results in order to interpret others.  Please give [name] 48 hours in order for your provider to thoroughly review all the results before contacting the office for clarification of your results.   Thank you for entrusting me with your care and for choosing [facility],  Dr. Eulah Pont

## 2023-06-24 DIAGNOSIS — I10 Essential (primary) hypertension: Secondary | ICD-10-CM | POA: Diagnosis not present

## 2023-06-24 NOTE — Telephone Encounter (Signed)
 Patient with diagnosis of PAF on Eliquis for anticoagulation.    Procedure: Colonoscopy Date of procedure: 07/22/23   CHA2DS2-VASc Score = 3   This indicates a 3.2% annual risk of stroke. The patient's score is based upon: CHF History: 0 HTN History: 1 Diabetes History: 0 Stroke History: 0 Vascular Disease History: 0 Age Score: 1 Gender Score: 1     CrCl 59 mL/min Platelet count 264 K    Per office protocol, patient can hold Eliquis for 2 days prior to procedure.     **This guidance is not considered finalized until pre-operative APP has relayed final recommendations.**

## 2023-06-24 NOTE — Telephone Encounter (Signed)
 Primary Cardiologist:[name] [name], DO  Chart reviewed as part of pre-operative protocol coverage. Because of Denise Becker's past medical history and time since last visit, he/she will require a follow-up visit in order to better assess preoperative cardiovascular risk.  Pre-op covering staff: -Patient has an coming appointment on 07/15/2023 with Dr. Odis Hollingshead at which time clearance can be addressed. - Please contact requesting surgeon's office via preferred method (i.e, phone, fax) to inform them of need for appointment prior to surgery.  Per office protocol, patient can hold Eliquis for 2 days prior to procedure.    [name], NP-C  06/24/2023, 10:14 AM [address] Office [phone] Fax [phone]

## 2023-07-14 ENCOUNTER — Encounter: Payer: Self-pay | Admitting: Internal Medicine

## 2023-07-15 ENCOUNTER — Encounter: Payer: Self-pay | Admitting: Cardiology

## 2023-07-15 ENCOUNTER — Ambulatory Visit: Attending: Cardiology | Admitting: Cardiology

## 2023-07-15 VITALS — BP 135/84 | HR 67 | Resp 16 | Ht 61.0 in | Wt 209.2 lb

## 2023-07-15 DIAGNOSIS — Z01810 Encounter for preprocedural cardiovascular examination: Secondary | ICD-10-CM

## 2023-07-15 DIAGNOSIS — I10 Essential (primary) hypertension: Secondary | ICD-10-CM

## 2023-07-15 DIAGNOSIS — Z7901 Long term (current) use of anticoagulants: Secondary | ICD-10-CM

## 2023-07-15 DIAGNOSIS — I480 Paroxysmal atrial fibrillation: Secondary | ICD-10-CM | POA: Diagnosis not present

## 2023-07-15 MED ORDER — METOPROLOL SUCCINATE ER 50 MG PO TB24: 50.0000 mg | ORAL_TABLET | Freq: Every day | ORAL | 5 refills | Status: DC

## 2023-07-15 NOTE — Progress Notes (Signed)
 Cardiology Office Note:  .   Date:  07/15/2023  ID:  Denise Becker, DOB Jun 05, 1950, MRN [id number] PCP:  Lorenda Ishihara, MD  Former Cardiology Providers: None  [facility] Providers Cardiologist:  Tessa Lerner, DO , [facility] (established care 09/23/2019) Electrophysiologist:  None  Click to update primary MD,subspecialty MD or APP then REFRESH:1}    Chief Complaint  Patient presents with   Paroxysmal atrial fibrillation    Pre-op Exam    History of Present Illness: .   Denise Becker is a 73 y.o. African-American female whose past medical history and cardiovascular risk factors includes: History of breast cancer status post mastectomy/radiation/chemotherapy, Smoldering Multiple Myeloma, hypertension, postmenopausal female, advanced age, obesity due to excess calories.   Patient being followed in the practice for paroxysmal atrial fibrillation.  In August 2023 she was diagnosed with bacteremia and was found to be in A-fib with RVR.  Despite parenteral medication she needed TEE guided cardioversion to restore sinus rhythm.  She has been placed on AV nodal blocking agents for rate control strategy and thromboembolic prophylaxis given her CHA2DS2-VASc score.  Given the fact that her A-fib episode was in the setting of bacteremia we had discussed in the past using implantable loop recorder to monitor for A-fib burden and if not present in the first 30 days of monitoring could discontinue anticoagulation and monitor A-fib burden longitudinally with ILR.  However patient favors to continue with anticoagulation given her CHA2DS2-VASc score and not to consider implantable loop recorder.  Patient is being considered for a routine colonoscopy and is here for preprocedural evaluation.  Since last office visit patient denies any anginal chest pain or heart failure symptoms.  No hospitalizations or urgent care visits for cardiovascular reasons.  She has been compliant with her  medical therapy.  No significant weight gain.  Physical endurance remains stable and home SBP remain stable.  Her overall function capacity remains relatively stable, does her activities of daily living mild to moderate house chores, and able to walk up the hill with her walker.  Patient is to go briskly up and down the stairs due to the need of walker.   Review of Systems: .   Review of Systems  Cardiovascular:  Negative for chest pain, claudication, irregular heartbeat, leg swelling, near-syncope, orthopnea, palpitations, paroxysmal nocturnal dyspnea and syncope.  Respiratory:  Negative for shortness of breath.   Hematologic/Lymphatic: Negative for bleeding problem.    Studies Reviewed:   EKG: EKG Interpretation Date/Time:  Wednesday July 15 2023 11:04:29 EDT Ventricular Rate:  63 PR Interval:  180 QRS Duration:  92 QT Interval:  376 QTC Calculation: 384 R Axis:   2  Text Interpretation: Normal sinus rhythm Nonspecific T wave abnormality When compared with ECG of 26-Nov-2021 11:27, Premature ventricular complexes are no longer Present Vent. rate has decreased BY  53 BPM Nonspecific T wave abnormality, worse in Lateral leads Confirmed by Tessa Lerner [phone]) on 07/15/2023 11:52:07 AM  Echocardiogram: 03/29/2013: LVEF 55-60%, normal wall motion, no regional wall motion abnormalities, average global longitudinal strain -18.6%.     09/29/2019: LVEF 55%, Mild LVH, Grade II diastolic dysfunction, mildly LAE.    06/19/2022:  1. Left ventricular ejection fraction, by estimation, is 55 to 60%. The left ventricle has normal function. The left ventricle has no regional wall motion abnormalities. There is mild left ventricular hypertrophy.  Left ventricular diastolic parameters were normal. The average left ventricular global longitudinal strain is -10.6 %. The global longitudinal strain is abnormal.  2. Right ventricular systolic function is normal. The right ventricular  size is normal.   3.  The mitral valve is normal in structure. No evidence of mitral valve regurgitation. No evidence of mitral stenosis.   4. The aortic valve was not well visualized. Aortic valve regurgitation is not visualized. No aortic stenosis is present.   5. Rhythm strip during this exam demonstrates normal sinus rhythm.  Comparison(s): Compared to transesophageal echocardiogram 11/26/2021 LVEF remains preserved otherwise no significant change.   RADIOLOGY: NA  Risk Assessment/Calculations:   Click Here to Calculate/Change CHADS2VASc Score The patient's CHADS2-VASc score is 3, indicating a 3.2% annual risk of stroke.   CHF History: No HTN History: Yes Diabetes History: No Stroke History: No Vascular Disease History: No  Labs:       Latest Ref Rng & Units 05/19/2023   11:47 AM 05/05/2023   10:42 AM 02/04/2023    2:27 PM  CBC  WBC 4.0 - 10.5 K/uL 7.2  6.7  7.8   Hemoglobin 12.0 - 15.0 g/dL 45.4  09.8  11.9   Hematocrit 36.0 - 46.0 % 41.9  42.0  39.5   Platelets 150 - 400 K/uL 264  273  258        Latest Ref Rng & Units 05/19/2023   11:47 AM 02/04/2023    2:27 PM 12/09/2022    9:31 AM  BMP  Glucose 70 - 99 mg/dL 147  93  829   BUN 8 - 23 mg/dL 13  15  20    Creatinine 0.44 - 1.00 mg/dL 5.62  1.30  8.65   Sodium 135 - 145 mmol/L 138  139  137   Potassium 3.5 - 5.1 mmol/L 3.2  3.5  3.4   Chloride 98 - 111 mmol/L 104  103  105   CO2 22 - 32 mmol/L 29  32  27   Calcium 8.9 - 10.3 mg/dL 9.6  78.4  9.6       Latest Ref Rng & Units 05/19/2023   11:47 AM 02/04/2023    2:27 PM 12/09/2022    9:31 AM  CMP  Glucose 70 - 99 mg/dL 696  93  295   BUN 8 - 23 mg/dL 13  15  20    Creatinine 0.44 - 1.00 mg/dL 2.84  1.32  4.40   Sodium 135 - 145 mmol/L 138  139  137   Potassium 3.5 - 5.1 mmol/L 3.2  3.5  3.4   Chloride 98 - 111 mmol/L 104  103  105   CO2 22 - 32 mmol/L 29  32  27   Calcium 8.9 - 10.3 mg/dL 9.6  10.2  9.6   Total Protein 6.5 - 8.1 g/dL 8.6  8.7  9.0   Total Bilirubin 0.0 - 1.2 mg/dL 0.3   0.4  0.3   Alkaline Phos 38 - 126 U/L 48  43  49   AST 15 - 41 U/L 11  13  13    ALT 0 - 44 U/L 11  12  13      Lab Results  Component Value Date   CHOL 182 03/17/2014   HDL 56 03/02/2013   No results for input(s): "LIPOA" in the last 8760 hours. No components found for: "NTPROBNP" No results for input(s): "PROBNP" in the last 8760 hours. No results for input(s): "TSH" in the last 8760 hours.  Physical Exam:    Today's Vitals   07/15/23 1107  BP: 135/84  Pulse: 67  Resp: 16  Weight: 209 lb 3.2 oz (94.9 kg)  Height: 5\' 1"  (1.549 m)   Body mass index is 39.53 kg/m. Wt Readings from Last 3 Encounters:  07/15/23 209 lb 3.2 oz (94.9 kg)  06/18/23 204 lb (92.5 kg)  05/05/23 210 lb 9.6 oz (95.5 kg)    Physical Exam  Constitutional: No distress.  Age appropriate, hemodynamically stable, ambulates with a walker.  Neck: No JVD present.  Cardiovascular: Normal rate, regular rhythm, S1 normal, S2 normal, intact distal pulses and normal pulses. Exam reveals no gallop, no S3 and no S4.  No murmur heard. Pulmonary/Chest: Effort normal and breath sounds normal. No stridor. She has no wheezes. She has no rales.  History of right mastectomy  Abdominal: Soft. Bowel sounds are normal. She exhibits no distension. There is no abdominal tenderness.  Musculoskeletal:        General: No edema.     Cervical back: Neck supple.  Neurological: She is alert and oriented to person, place, and time. She has intact cranial nerves (2-12).  Skin: Skin is warm and moist.     Impression & Recommendation(s):  Impression:   ICD-10-CM   1. Paroxysmal atrial fibrillation (HCC)  I48.0 EKG 12-Lead    metoprolol succinate (TOPROL-XL) 50 MG 24 hr tablet    2. Long term (current) use of anticoagulants  Z79.01     3. Preprocedural cardiovascular examination  Z01.810     4. Essential hypertension, benign  I10        Recommendation(s):  Paroxysmal atrial fibrillation (HCC) Rate control:  Metoprolol. Rhythm control: N/A. Thromboembolic prophylaxis: Eliquis History of TEE guided cardioversion We have discussed the role of the loop recorder to monitor A-fib burden longitudinally and if no A-fib is detected in the first could come off Eliquis.  However patient has been reluctant with ILR implant. Patient prefers to be on anticoagulation. Will transition her from Lopressor 25 mg p.o. twice daily to Toprol-XL 50 mg p.o. every morning  Long term (current) use of anticoagulants Patient does not endorse evidence of bleeding. Most recent labs from February 2025 independently reviewed-hemoglobin and renal function at baseline Risks, benefits, and alternatives to anticoagulation discussed  Preprocedural cardiovascular examination Patient is being considered for routine colonoscopy by gastroenterology Denies anginal chest pain or heart failure symptoms, no known severe valvular heart disease, no recent ACS, overall functional capacity remains stable but limited as she requires a cane/walker with prolonged ambulation. Prior echo cardiogram notes preserved LVEF.  And EKG today is overall nonischemic According to the Revised Cardiac Risk Index (RCRI), her Perioperative Risk of Major Cardiac Event is (%): 0.4 Her Functional Capacity in METs is: 5.07 according to the Duke Activity Status Index (DASI). No additional cardiovascular testing is warranted. Patient is acceptable risk for upcoming noncardiac procedure.  Essential hypertension, benign Blood pressures are acceptable.  Home blood pressures are better controlled. Reemphasized importance of low-salt diet. Reemphasized importance of keeping an ambulatory blood pressure log  Orders Placed:  Orders Placed This Encounter  Procedures   EKG 12-Lead     Final Medication List:    Meds ordered this encounter  Medications   metoprolol succinate (TOPROL-XL) 50 MG 24 hr tablet    Sig: Take 1 tablet (50 mg total) by mouth daily. Take  with or immediately following a meal. HOLD if systolic blood pressure (top number) is less than 100 and/ or heart rate is less than 55.    Dispense:  30 tablet    Refill:  5  Switching from lopressor to toprol-xl    Medications Discontinued During This Encounter  Medication Reason   hydrochlorothiazide (HYDRODIURIL) 25 MG tablet Change in therapy   losartan (COZAAR) 100 MG tablet Change in therapy   metoprolol tartrate (LOPRESSOR) 25 MG tablet      Current Outpatient Medications:    apixaban (ELIQUIS) 5 MG TABS tablet, Take 1 tablet (5 mg total) by mouth 2 (two) times daily., Disp: 180 tablet, Rfl: 0   calcium-vitamin D (OSCAL WITH D) 500-5 MG-MCG tablet, Take 1 tablet by mouth daily with breakfast., Disp: 30 tablet, Rfl: 6   losartan-hydrochlorothiazide (HYZAAR) 100-25 MG tablet, Take 1 tablet by mouth daily., Disp: , Rfl:    metoprolol succinate (TOPROL-XL) 50 MG 24 hr tablet, Take 1 tablet (50 mg total) by mouth daily. Take with or immediately following a meal. HOLD if systolic blood pressure (top number) is less than 100 and/ or heart rate is less than 55., Disp: 30 tablet, Rfl: 5   Multiple Vitamins-Minerals (MULTIVITAMIN ADULT) CHEW, Chew 2 tablets by mouth 2 (two) times a week., Disp: , Rfl:    Na Sulfate-K Sulfate-Mg Sulfate concentrate (SUPREP) 17.5-3.13-1.6 GM/177ML SOLN, Use as directed; may use generic; goodrx card if insurance will not cover generic, Disp: 354 mL, Rfl: 0   niacin (,VITAMIN B3,) 500 MG tablet, Take 500 mg by mouth at bedtime., Disp: , Rfl:    saccharomyces boulardii (FLORASTOR) 250 MG capsule, Take 250 mg by mouth 2 (two) times daily., Disp: , Rfl:    tamoxifen (NOLVADEX) 20 MG tablet, Take 1 tablet (20 mg total) by mouth daily., Disp: 90 tablet, Rfl: 4  Consent:   NA  Disposition:   29-month follow-up sooner if needed  Her questions and concerns were addressed to her satisfaction. She voices understanding of the recommendations provided during this  encounter.    Signed, Olinda Bertrand, DO, [facility]  [facility]  [address] [place], [place] [address] 07/15/2023 1:38 PM

## 2023-07-15 NOTE — Patient Instructions (Addendum)
 Medication Instructions:  Your physician has recommended you make the following change in your medication:   STOP Metoprolol Tartrate (Lopressor)  START Metoprolol Succinate (Toprol-XL) 50 mg once daily in the morning   HOLD if systolic blood pressure (top number) is less than 100 and/ or heart rate is less than 55.   *If you need a refill on your cardiac medications before your next appointment, please call your pharmacy*  Lab Work: None ordered today. If you have labs (blood work) drawn today and your tests are completely normal, you will receive your results only by: MyChart Message (if you have MyChart) OR A paper copy in the mail If you have any lab test that is abnormal or we need to change your treatment, we will call you to review the results.  Testing/Procedures: None ordered today.  Follow-Up: At [facility], you and your health needs are our priority.  As part of our continuing mission to provide you with exceptional heart care, we have created designated Provider Care Teams.  These Care Teams include your primary Cardiologist (physician) and Advanced Practice Providers (APPs -  Physician Assistants and Nurse Practitioners) who all work together to provide you with the care you need, when you need it.  We recommend signing up for the patient portal called "MyChart".  Sign up information is provided on this After Visit Summary.  MyChart is used to connect with patients for Virtual Visits (Telemedicine).  Patients are able to view lab/test results, encounter notes, upcoming appointments, etc.  Non-urgent messages can be sent to your provider as well.   To learn more about what you can do with MyChart, go to [email or web address].    Your next appointment:   6 month(s)  The format for your next appointment:   In Person  Provider:   Olinda Bertrand, [facility]  Other Instructions   1st Floor: - Lobby - Registration  - Pharmacy  - Lab - Cafe  2nd Floor: - PV Lab -  Diagnostic Testing (echo, CT, nuclear med)  3rd Floor: - Vacant  4th Floor: - TCTS (cardiothoracic surgery) - AFib Clinic - Structural Heart Clinic - Vascular Surgery  - Vascular Ultrasound  5th Floor: - HeartCare Cardiology (general and EP) - Clinical Pharmacy for coumadin, hypertension, lipid, weight-loss medications, and med management appointments    Valet parking services will be available as well.

## 2023-07-20 ENCOUNTER — Telehealth: Payer: Self-pay

## 2023-07-20 NOTE — Telephone Encounter (Signed)
 Spoke to patient advise received message via epic from cardiology okay to hold Eliquis  for 2 days prior to procedure. Patient verbalized understanding

## 2023-07-22 ENCOUNTER — Encounter: Payer: Self-pay | Admitting: Internal Medicine

## 2023-07-22 ENCOUNTER — Ambulatory Visit (AMBULATORY_SURGERY_CENTER): Admitting: Internal Medicine

## 2023-07-22 VITALS — BP 120/65 | HR 52 | Temp 98.0°F | Resp 15 | Ht 61.0 in | Wt 204.0 lb

## 2023-07-22 DIAGNOSIS — D123 Benign neoplasm of transverse colon: Secondary | ICD-10-CM

## 2023-07-22 DIAGNOSIS — D122 Benign neoplasm of ascending colon: Secondary | ICD-10-CM

## 2023-07-22 DIAGNOSIS — K648 Other hemorrhoids: Secondary | ICD-10-CM | POA: Diagnosis not present

## 2023-07-22 DIAGNOSIS — Z1211 Encounter for screening for malignant neoplasm of colon: Secondary | ICD-10-CM

## 2023-07-22 DIAGNOSIS — F419 Anxiety disorder, unspecified: Secondary | ICD-10-CM | POA: Diagnosis not present

## 2023-07-22 DIAGNOSIS — I10 Essential (primary) hypertension: Secondary | ICD-10-CM | POA: Diagnosis not present

## 2023-07-22 DIAGNOSIS — K5730 Diverticulosis of large intestine without perforation or abscess without bleeding: Secondary | ICD-10-CM | POA: Diagnosis not present

## 2023-07-22 MED ORDER — SODIUM CHLORIDE 0.9 % IV SOLN: 500.0000 mL | Freq: Once | INTRAVENOUS | Status: DC

## 2023-07-22 NOTE — Progress Notes (Signed)
 A/o x 3, VSS, gd SR's, pleased with anesthesia, report to RN

## 2023-07-22 NOTE — Progress Notes (Signed)
 GASTROENTEROLOGY PROCEDURE H&P NOTE   Primary Care Physician: Arva Lathe, MD    Reason for Procedure:   Colon cancer screening  Plan:    Colonoscopy  Patient is appropriate for endoscopic procedure(s) in the ambulatory ([facility]) setting.  The nature of the procedure, as well as the risks, benefits, and alternatives were carefully and thoroughly reviewed with the patient. Ample time for discussion and questions allowed. The patient understood, was satisfied, and agreed to proceed.     HPI: Denise Becker is a 73 y.o. female who presents for colonoscopy for evaluation of colon cancer screening.  Patient was most recently seen in the Gastroenterology Clinic on 06/18/23.  No interval change in medical history since that appointment. Please refer to that note for full details regarding GI history and clinical presentation.   Past Medical History:  Diagnosis Date   Anxiety    anxiousabout impending surgery- loss of breast   Arthritis    "joints hurt q now and then" (10/25/2013)   Breast cancer (HCC)    Breast cancer of upper-outer quadrant of right female breast (HCC) 03/10/2013   Depression    history of depression after son died in the 2000's   GERD (gastroesophageal reflux disease)    "just chemo related"   H/O hiatal hernia    Hypertension    Personal history of chemotherapy    Personal history of radiation therapy    Radiation 12/08/13-01/17/14   Right chest wall 50.4 Gy   Spontaneous dislocation of shoulder [phone]   "right; a few times over the years"    Past Surgical History:  Procedure Laterality Date   ABDOMINAL HYSTERECTOMY  1990's   BREAST BIOPSY Right 02/2013   CARDIOVERSION  11/26/2021   Procedure: CARDIOVERSION;  Surgeon: Bridgette Campus, MD;  Location: [facility];  Service: Cardiovascular;;   CESAREAN SECTION  1980   MASTECTOMY COMPLETE / SIMPLE W/ SENTINEL NODE BIOPSY Right 10/25/2013   MASTECTOMY W/ SENTINEL NODE BIOPSY Right 10/25/2013    Procedure: RIGHT TOTAL MASTECTOMY WITH SENTINEL LYMPH NODE BIOPSY;  Surgeon: Levert Ready, MD;  Location: [facility] OR;  Service: General;  Laterality: Right;   MULTIPLE TOOTH EXTRACTIONS Bilateral 2011-2014 X 6   PORT A CATH REVISION Right 04/29/2013   Procedure: PORT A CATH REVISION;  Surgeon: Levert Ready, MD;  Location: [facility];  Service: General;  Laterality: Right;   PORT-A-CATH REMOVAL  10/25/2013   PORT-A-CATH REMOVAL N/A 10/25/2013   Procedure: REMOVAL PORT-A-CATH;  Surgeon: Levert Ready, MD;  Location: [facility] OR;  Service: General;  Laterality: N/A;   PORTACATH PLACEMENT Right 04/15/2013   Procedure: INSERTION PORT-A-CATH;  Surgeon: Levert Ready, MD;  Location: [facility];  Service: General;  Laterality: Right;   TEE WITHOUT CARDIOVERSION N/A 11/26/2021   Procedure: TRANSESOPHAGEAL ECHOCARDIOGRAM (TEE);  Surgeon: Bridgette Campus, MD;  Location: [facility]COPY;  Service: Cardiovascular;  Laterality: N/A;   TONSILLECTOMY     WISDOM TOOTH EXTRACTION      Prior to Admission medications   Medication Sig Start Date End Date Taking? Authorizing Provider  apixaban  (ELIQUIS ) 5 MG TABS tablet Take 1 tablet (5 mg total) by mouth 2 (two) times daily. 12/04/22 07/22/23 Yes [name], [name], DO  calcium -vitamin D  (OSCAL WITH D) 500-5 MG-MCG tablet Take 1 tablet by mouth daily with breakfast. 03/13/22  Yes [name], [name], PA-C  losartan -hydrochlorothiazide  (HYZAAR) 100-25 MG tablet Take 1 tablet by mouth daily. 12/05/22  Yes [provider]  metoprolol  succinate (TOPROL -XL) 50 MG 24 hr tablet Take 1 tablet (50 mg total) by mouth daily. Take with or immediately following a meal. HOLD if systolic blood pressure (top number) is less than 100 and/ or heart rate is less than 55. 07/15/23  Yes [name], [name], DO  Multiple Vitamins-Minerals (MULTIVITAMIN ADULT) CHEW Chew 2 tablets by mouth 2 (two) times a week.   Yes [provider]  niacin (,VITAMIN B3,) 500 MG tablet  Take 500 mg by mouth at bedtime.   Yes [provider]  saccharomyces boulardii (FLORASTOR) 250 MG capsule Take 250 mg by mouth 2 (two) times daily.   Yes [provider]  tamoxifen  (NOLVADEX ) 20 MG tablet Take 1 tablet (20 mg total) by mouth daily. 12/09/22  Yes Iruku, Praveena, MD    Current Outpatient Medications  Medication Sig Dispense Refill   apixaban  (ELIQUIS ) 5 MG TABS tablet Take 1 tablet (5 mg total) by mouth 2 (two) times daily. 180 tablet 0   calcium -vitamin D  (OSCAL WITH D) 500-5 MG-MCG tablet Take 1 tablet by mouth daily with breakfast. 30 tablet 6   losartan -hydrochlorothiazide  (HYZAAR) 100-25 MG tablet Take 1 tablet by mouth daily.     metoprolol  succinate (TOPROL -XL) 50 MG 24 hr tablet Take 1 tablet (50 mg total) by mouth daily. Take with or immediately following a meal. HOLD if systolic blood pressure (top number) is less than 100 and/ or heart rate is less than 55. 30 tablet 5   Multiple Vitamins-Minerals (MULTIVITAMIN ADULT) CHEW Chew 2 tablets by mouth 2 (two) times a week.     niacin (,VITAMIN B3,) 500 MG tablet Take 500 mg by mouth at bedtime.     saccharomyces boulardii (FLORASTOR) 250 MG capsule Take 250 mg by mouth 2 (two) times daily.     tamoxifen  (NOLVADEX ) 20 MG tablet Take 1 tablet (20 mg total) by mouth daily. 90 tablet 4   Current Facility-Administered Medications  Medication Dose Route Frequency Provider Last Rate Last Admin   0.9 %  sodium chloride  infusion  500 mL Intravenous Once Daina Drum, MD        Allergies as of 07/22/2023 - Review Complete 07/22/2023  Allergen Reaction Noted   Ciprofloxacin  Diarrhea 06/29/2013   Effexor  [venlafaxine ] Other (See Comments) 05/21/2021   Penicillins Rash 10/09/2017    Family History  Problem Relation Age of Onset   Hypertension Mother    Bowel Disease Mother    Lung disease Father    Cancer Father    Breast cancer Maternal Aunt 83   Heart disease Maternal Uncle    Heart attack Maternal  Uncle    Heart disease Maternal Grandmother    Heart disease Maternal Grandfather    Acute lymphoblastic leukemia Son     Social History   Socioeconomic History   Marital status: Single    Spouse name: Not on file   Number of children: 1   Years of education: Not on file   Highest education level: Not on file  Occupational History   Not on file  Tobacco Use   Smoking status: Former    Current packs/day: 0.00    Average packs/day: 0.3 packs/day for 3.0 years (0.8 ttl pk-yrs)    Types: Cigarettes    Start date: 04/12/1998    Quit date: 04/12/2001    Years since quitting: 22.2   Smokeless tobacco: Never   Tobacco comments:    "casual smoker; pack would go stale on me"  Vaping Use  Vaping status: Never Used  Substance and Sexual Activity   Alcohol use: Yes    Comment: 10/25/2013 "might have a glass of wine a few times/year"   Drug use: No   Sexual activity: Never    Birth control/protection: Surgical  Other Topics Concern   Not on file  Social History Narrative   Not on file   Social Drivers of Health   Financial Resource Strain: Not on file  Food Insecurity: Not on file  Transportation Needs: Not on file  Physical Activity: Not on file  Stress: Not on file  Social Connections: Not on file  Intimate Partner Violence: Not on file    Physical Exam: Vital signs in last 24 hours: BP (!) 146/72   Pulse (!) 54   Temp 98 F (36.7 C) (Temporal)   Ht 5\' 1"  (1.549 m)   Wt 204 lb (92.5 kg)   SpO2 96%   BMI 38.55 kg/m  GEN: NAD EYE: Sclerae anicteric ENT: MMM CV: Non-tachycardic Pulm: No increased WOB GI: Soft NEURO:  Alert & Oriented   Regino Caprio, MD [facility]   07/22/2023 2:01 PM

## 2023-07-22 NOTE — Progress Notes (Signed)
 Called to room to assist during endoscopic procedure.  Patient ID and intended procedure confirmed with present staff. Received instructions for my participation in the procedure from the performing physician.

## 2023-07-22 NOTE — Patient Instructions (Signed)
 Resume regular diet Resume regular medications (restart Eliquis  tomorrow) See handouts for diverticulosis, hemorrhoids and polyps  YOU HAD AN ENDOSCOPIC PROCEDURE TODAY AT THE [facility]:   Refer to the procedure report that was given to you for any specific questions about what was found during the examination.  If the procedure report does not answer your questions, please call your gastroenterologist to clarify.  If you requested that your care partner not be given the details of your procedure findings, then the procedure report has been included in a sealed envelope for you to review at your convenience later.  YOU SHOULD EXPECT: Some feelings of bloating in the abdomen. Passage of more gas than usual.  Walking can help get rid of the air that was put into your GI tract during the procedure and reduce the bloating. If you had a lower endoscopy (such as a colonoscopy or flexible sigmoidoscopy) you may notice spotting of blood in your stool or on the toilet paper. If you underwent a bowel prep for your procedure, you may not have a normal bowel movement for a few days.  Please Note:  You might notice some irritation and congestion in your nose or some drainage.  This is from the oxygen used during your procedure.  There is no need for concern and it should clear up in a day or so.  SYMPTOMS TO REPORT IMMEDIATELY:  Following lower endoscopy (colonoscopy or flexible sigmoidoscopy):  Excessive amounts of blood in the stool  Significant tenderness or worsening of abdominal pains  Swelling of the abdomen that is new, acute  Fever of 100F or higher  For urgent or emergent issues, a gastroenterologist can be reached at any hour by calling [phone]. Do not use MyChart messaging for urgent concerns.   DIET:  We do recommend a small meal at first, but then you may proceed to your regular diet.  Drink plenty of fluids but you should avoid alcoholic beverages for 24  hours.  ACTIVITY:  You should plan to take it easy for the rest of today and you should NOT DRIVE or use heavy machinery until tomorrow (because of the sedation medicines used during the test).    FOLLOW UP: Our staff will call the number listed on your records the next business day following your procedure.  We will call around 7:15- 8:00 am to check on you and address any questions or concerns that you may have regarding the information given to you following your procedure. If we do not reach you, we will leave a message.     If any biopsies were taken you will be contacted by phone or by letter within the next 1-3 weeks.  Please call us  at [phone] if you have not heard about the biopsies in 3 weeks.   SIGNATURES/CONFIDENTIALITY: You and/or your care partner have signed paperwork which will be entered into your electronic medical record.  These signatures attest to the fact that that the information above on your After Visit Summary has been reviewed and is understood.  Full responsibility of the confidentiality of this discharge information lies with you and/or your care-partner.

## 2023-07-22 NOTE — Op Note (Signed)
 [facility] Patient Name: Denise Becker Procedure Date: 07/22/2023 2:06 PM MRN: [id number] Endoscopist: Freada [place] , , [phone] Age: 73 Referring MD:  Date of Birth: 02/19/1951 Gender: Female Account #: [id number] Procedure:                Colonoscopy Indications:              Screening for colorectal malignant neoplasm, This                            is the patient's first colonoscopy Medicines:                Monitored Anesthesia Care Procedure:                Pre-Anesthesia Assessment:                           - Prior to the procedure, a History and Physical                            was performed, and patient medications and                            allergies were reviewed. The patient's tolerance of                            previous anesthesia was also reviewed. The risks                            and benefits of the procedure and the sedation                            options and risks were discussed with the patient.                            All questions were answered, and informed consent                            was obtained. Prior Anticoagulants: The patient has                            taken Eliquis  (apixaban ), last dose was 2 days                            prior to procedure. ASA Grade Assessment: III - A                            patient with severe systemic disease. After                            reviewing the risks and benefits, the patient was                            deemed in satisfactory condition to undergo the  procedure.                           After obtaining informed consent, the colonoscope                            was passed under direct vision. Throughout the                            procedure, the patient's blood pressure, pulse, and                            oxygen saturations were monitored continuously. The                            CF [id number] #[id number] was introduced through  the anus                            and advanced to the the terminal ileum. The                            colonoscopy was performed without difficulty. The                            patient tolerated the procedure well. The quality                            of the bowel preparation was good. The terminal                            ileum, ileocecal valve, appendiceal orifice, and                            rectum were photographed. Scope In: 2:13:46 PM Scope Out: 2:34:05 PM Scope Withdrawal Time: 0 hours 12 minutes 16 seconds  Total Procedure Duration: 0 hours 20 minutes 19 seconds  Findings:                 The terminal ileum appeared normal.                           Five sessile polyps were found in the transverse                            colon and ascending colon. The polyps were 3 to 10                            mm in size. These polyps were removed with a cold                            snare. Resection and retrieval were complete.                           Multiple diverticula were found in the sigmoid  colon and descending colon.                           Non-bleeding internal hemorrhoids were found during                            retroflexion. Complications:            No immediate complications. Estimated Blood Loss:     Estimated blood loss was minimal. Impression:               - The examined portion of the ileum was normal.                           - Five 3 to 10 mm polyps in the transverse colon                            and in the ascending colon, removed with a cold                            snare. Resected and retrieved.                           - Diverticulosis in the sigmoid colon and in the                            descending colon.                           - Non-bleeding internal hemorrhoids. Recommendation:           - Discharge patient to home (with escort).                           - Await pathology results.                            - Okay to restart your Eliquis  tomorrow.                           - The findings and recommendations were discussed                            with the patient. Dr Pedro Bourgeois "[address]" [place],  07/22/2023 2:38:14 PM

## 2023-07-22 NOTE — Progress Notes (Signed)
 Pt's states no medical or surgical changes since previsit or office visit.

## 2023-07-23 ENCOUNTER — Telehealth: Payer: Self-pay

## 2023-07-23 NOTE — Telephone Encounter (Signed)
 Attempted f/u call. No answer, left VM.

## 2023-07-27 LAB — SURGICAL PATHOLOGY

## 2023-07-28 ENCOUNTER — Encounter: Payer: Self-pay | Admitting: Internal Medicine

## 2023-08-03 ENCOUNTER — Inpatient Hospital Stay: Payer: Medicare HMO | Attending: Hematology and Oncology | Admitting: Hematology and Oncology

## 2023-08-03 ENCOUNTER — Other Ambulatory Visit: Payer: Self-pay

## 2023-08-03 ENCOUNTER — Inpatient Hospital Stay: Payer: Medicare HMO

## 2023-08-03 VITALS — BP 148/72 | HR 65 | Temp 98.6°F | Resp 16 | Wt 212.5 lb

## 2023-08-03 DIAGNOSIS — Z79810 Long term (current) use of selective estrogen receptor modulators (SERMs): Secondary | ICD-10-CM | POA: Insufficient documentation

## 2023-08-03 DIAGNOSIS — D472 Monoclonal gammopathy: Secondary | ICD-10-CM | POA: Diagnosis not present

## 2023-08-03 DIAGNOSIS — C50411 Malignant neoplasm of upper-outer quadrant of right female breast: Secondary | ICD-10-CM | POA: Insufficient documentation

## 2023-08-03 DIAGNOSIS — Z9011 Acquired absence of right breast and nipple: Secondary | ICD-10-CM | POA: Insufficient documentation

## 2023-08-03 LAB — CMP (CANCER CENTER ONLY)
ALT: 16 U/L (ref 0–44)
AST: 15 U/L (ref 15–41)
Albumin: 3.7 g/dL (ref 3.5–5.0)
Alkaline Phosphatase: 49 U/L (ref 38–126)
Anion gap: 3 — ABNORMAL LOW (ref 5–15)
BUN: 12 mg/dL (ref 8–23)
CO2: 29 mmol/L (ref 22–32)
Calcium: 9 mg/dL (ref 8.9–10.3)
Chloride: 104 mmol/L (ref 98–111)
Creatinine: 0.8 mg/dL (ref 0.44–1.00)
GFR, Estimated: >60 mL/min (ref >60)
Glucose, Bld: 108 mg/dL — ABNORMAL HIGH (ref 70–99)
Potassium: 3.5 mmol/L (ref 3.5–5.1)
Sodium: 136 mmol/L (ref 135–145)
Total Bilirubin: 0.3 mg/dL (ref 0.0–1.2)
Total Protein: 8.4 g/dL — ABNORMAL HIGH (ref 6.5–8.1)

## 2023-08-03 LAB — CBC WITH DIFFERENTIAL/PLATELET
Abs Immature Granulocytes: 0.02 10*3/uL (ref 0.00–0.07)
Basophils Absolute: 0.1 10*3/uL (ref 0.0–0.1)
Basophils Relative: 1 %
Eosinophils Absolute: 0.4 10*3/uL (ref 0.0–0.5)
Eosinophils Relative: 5 %
HCT: 39 % (ref 36.0–46.0)
Hemoglobin: 13.1 g/dL (ref 12.0–15.0)
Immature Granulocytes: 0 %
Lymphocytes Relative: 38 %
Lymphs Abs: 2.9 10*3/uL (ref 0.7–4.0)
MCH: 27.7 pg (ref 26.0–34.0)
MCHC: 33.6 g/dL (ref 30.0–36.0)
MCV: 82.5 fL (ref 80.0–100.0)
Monocytes Absolute: 0.8 10*3/uL (ref 0.1–1.0)
Monocytes Relative: 10 %
Neutro Abs: 3.5 10*3/uL (ref 1.7–7.7)
Neutrophils Relative %: 46 %
Platelets: 272 10*3/uL (ref 150–400)
RBC: 4.73 MIL/uL (ref 3.87–5.11)
RDW: 14.6 % (ref 11.5–15.5)
WBC: 7.7 10*3/uL (ref 4.0–10.5)
nRBC: 0 % (ref 0.0–0.2)

## 2023-08-03 NOTE — Progress Notes (Signed)
 [facility] [phone]   [phone]  INITIAL CONSULT NOTE  Patient Care Team: Arva Lathe, MD as PCP - General (Internal Medicine) Olinda Bertrand, DO as PCP - Cardiology (Cardiology) Abdul Hodgkin, MD as Consulting Physician (Obstetrics and Gynecology)  Hematological/Oncological History #Right breast cancer -03/03/2013: Right breast biopsy: T3 N1, stage IIIA invasive ductal carcinoma, grade 3, estrogen receptor 100% positive, progesterone receptor 100% positive, with an MIB-1 of 20% and no HER-2 amplification -03/04/2014: Right axillary lymph node biopsy was negatibe -08/23/2013: Received 6 cycles of neoadjuvant chemotherapy with docetaxel , doxorubicin  and cyclophosphamide . -10/25/2013: Underwent right total mastectomy and right axillary sentinel node biopsy. Pathology revealed ypT3 ypN0 invasive lobular carcinoma, again estrogen and progesterone receptor positive and HER-2 nonamplified; with negative margins -12/08/2013-01/17/2014: Adjuvant radiation to right chest wall. 50.4 Gy in 28 fractions.  -03/01/2014: Started tamoxifen  with plans to continue for 10 years.   #Monoclonal gammopathy -03/13/2021: SPEP showed M protein measuring 2.1 g/dL, serum IgG measuring 1,478 mg/dL. Immunofixation showed IgG monocloncal protein with lamda light chain specificity. Lambda free light chains elevated at 641.1 with kappa/lamda ratio at 0.03.  -04/22/2021:Kappa free light chains elevated at 21.5 mg/L, Lambda free light chains elevated at 675.6 mg/L with kappa/lamda ratio at 0.03.  -05/21/2021: Establish care with Wyline Hearing PA-C --Bone survey done on May 24, 2021 showed no evidence of lytic lesions.  Moderate multifocal degenerative changes --Bone marrow aspiration and biopsy done on July 02, 2021 showed normocellular bone marrow for age with plasmacytosis plasma cells 13% plasma cells apparently display lambda light chain excess and very limited  staining most suggestive of plasma cell neoplasm especially in presence of monoclonal protein.  Cytogenetics normal, FISH normal No significant change in labs in December to consider progression.  CHIEF COMPLAINTS/PURPOSE OF CONSULTATION:  Monoclonal gammopathy  HISTORY OF PRESENTING ILLNESS:  Discussed the use of AI scribe software for clinical note transcription with the patient, who gave verbal consent to proceed.  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  No significant changes in health since the last visit. No recent fevers, night sweats, or changes in appetite. Occasional nervousness while driving, attributed to 'crazy drivers' and possibly aging.  History of sessile polyps with five polyps identified during a recent evaluation. Follow-up recommended in three years due to tubular adenomas.  Currently working on weight loss with fluctuations noted depending on the scale used.  Uses a walker due to fear of falling. Right leg gives way if walking too far. No new bone pains or trouble urinating. Occasional numbness attributed to sleeping position.  Currently taking tamoxifen , to be completed by the end of the year. Last mammogram in November 2024, next due in November 2025.  Rest of the pertinent 10 point ROS reviewed and neg.  MEDICAL HISTORY:  Past Medical History:  Diagnosis Date   Anxiety    anxiousabout impending surgery- loss of breast   Arthritis    "joints hurt q now and then" (10/25/2013)   Breast cancer (HCC)    Breast cancer of upper-outer quadrant of right female breast (HCC) 03/10/2013   Depression    history of depression after son died in the 2000's   GERD (gastroesophageal reflux disease)    "just chemo related"   H/O hiatal hernia    Hypertension    Personal history of chemotherapy    Personal history of radiation therapy    Radiation 12/08/13-01/17/14  Right  chest wall 50.4 Gy   Spontaneous dislocation of shoulder [phone]   "right; a few times over the years"    SURGICAL HISTORY: Past Surgical History:  Procedure Laterality Date   ABDOMINAL HYSTERECTOMY  1990's   BREAST BIOPSY Right 02/2013   CARDIOVERSION  11/26/2021   Procedure: CARDIOVERSION;  Surgeon: Bridgette Campus, MD;  Location: [facility];  Service: Cardiovascular;;   CESAREAN SECTION  1980   MASTECTOMY COMPLETE / SIMPLE W/ SENTINEL NODE BIOPSY Right 10/25/2013   MASTECTOMY W/ SENTINEL NODE BIOPSY Right 10/25/2013   Procedure: RIGHT TOTAL MASTECTOMY WITH SENTINEL LYMPH NODE BIOPSY;  Surgeon: Levert Ready, MD;  Location: [facility] OR;  Service: General;  Laterality: Right;   MULTIPLE TOOTH EXTRACTIONS Bilateral 2011-2014 X 6   PORT A CATH REVISION Right 04/29/2013   Procedure: PORT A CATH REVISION;  Surgeon: Levert Ready, MD;  Location: [facility];  Service: General;  Laterality: Right;   PORT-A-CATH REMOVAL  10/25/2013   PORT-A-CATH REMOVAL N/A 10/25/2013   Procedure: REMOVAL PORT-A-CATH;  Surgeon: Levert Ready, MD;  Location: [facility] OR;  Service: General;  Laterality: N/A;   PORTACATH PLACEMENT Right 04/15/2013   Procedure: INSERTION PORT-A-CATH;  Surgeon: Levert Ready, MD;  Location:  SURGERY CENTER;  Service: General;  Laterality: Right;   TEE WITHOUT CARDIOVERSION N/A 11/26/2021   Procedure: TRANSESOPHAGEAL ECHOCARDIOGRAM (TEE);  Surgeon: Bridgette Campus, MD;  Location: [facility];  Service: Cardiovascular;  Laterality: N/A;   TONSILLECTOMY     WISDOM TOOTH EXTRACTION      SOCIAL HISTORY: Social History   Socioeconomic History   Marital status: Single    Spouse name: Not on file   Number of children: 1   Years of education: Not on file   Highest education level: Not on file  Occupational History   Not on file  Tobacco Use   Smoking status: Former    Current packs/day: 0.00    Average packs/day: 0.3 packs/day for 3.0 years (0.8 ttl  pk-yrs)    Types: Cigarettes    Start date: 04/12/1998    Quit date: 04/12/2001    Years since quitting: 22.3   Smokeless tobacco: Never   Tobacco comments:    "casual smoker; pack would go stale on me"  Vaping Use   Vaping status: Never Used  Substance and Sexual Activity   Alcohol use: Yes    Comment: 10/25/2013 "might have a glass of wine a few times/year"   Drug use: No   Sexual activity: Never    Birth control/protection: Surgical  Other Topics Concern   Not on file  Social History Narrative   Not on file   Social Drivers of Health   Financial Resource Strain: Not on file  Food Insecurity: Not on file  Transportation Needs: Not on file  Physical Activity: Not on file  Stress: Not on file  Social Connections: Not on file  Intimate Partner Violence: Not on file    FAMILY HISTORY: Family History  Problem Relation Age of Onset   Hypertension Mother    Bowel Disease Mother    Lung disease Father    Cancer Father    Breast cancer Maternal Aunt 83   Heart disease Maternal Uncle    Heart attack Maternal Uncle    Heart disease Maternal Grandmother    Heart disease Maternal Grandfather    Acute lymphoblastic leukemia Son     ALLERGIES:  is allergic to ciprofloxacin , effexor  [venlafaxine ], and penicillins.  MEDICATIONS:  Current Outpatient Medications  Medication Sig Dispense Refill   apixaban  (ELIQUIS ) 5 MG TABS tablet Take 1 tablet (5 mg total) by mouth 2 (two) times daily. 180 tablet 0   calcium -vitamin D  (OSCAL WITH D) 500-5 MG-MCG tablet Take 1 tablet by mouth daily with breakfast. 30 tablet 6   losartan -hydrochlorothiazide  (HYZAAR) 100-25 MG tablet Take 1 tablet by mouth daily.     metoprolol  succinate (TOPROL -XL) 50 MG 24 hr tablet Take 1 tablet (50 mg total) by mouth daily. Take with or immediately following a meal. HOLD if systolic blood pressure (top number) is less than 100 and/ or heart rate is less than 55. 30 tablet 5   Multiple Vitamins-Minerals  (MULTIVITAMIN ADULT) CHEW Chew 2 tablets by mouth 2 (two) times a week.     niacin (,VITAMIN B3,) 500 MG tablet Take 500 mg by mouth at bedtime.     saccharomyces boulardii (FLORASTOR) 250 MG capsule Take 250 mg by mouth 2 (two) times daily.     tamoxifen  (NOLVADEX ) 20 MG tablet Take 1 tablet (20 mg total) by mouth daily. 90 tablet 4   No current facility-administered medications for this visit.    REVIEW OF SYSTEMS:   Constitutional: ( - ) fevers, ( - )  chills , ( - ) night sweats Eyes: ( - ) blurriness of vision, ( - ) double vision, ( - ) watery eyes Ears, nose, mouth, throat, and face: ( - ) mucositis, ( - ) sore throat Respiratory: ( - ) cough, ( - ) dyspnea, ( - ) wheezes Cardiovascular: ( - ) palpitation, ( - ) chest discomfort, ( - ) lower extremity swelling Gastrointestinal:  ( - ) nausea, ( - ) heartburn, ( - ) change in bowel habits Skin: ( - ) abnormal skin rashes Lymphatics: ( - ) new lymphadenopathy, ( - ) easy bruising Neurological: ( - ) numbness, ( - ) tingling, ( - ) new weaknesses Behavioral/Psych: ( - ) mood change, ( - ) new changes  All other systems were reviewed with the patient and are negative.  PHYSICAL EXAMINATION: ECOG PERFORMANCE STATUS: 1 - Symptomatic but completely ambulatory  Vitals:   08/03/23 1144  BP: (!) 148/72  Pulse: 65  Resp: 16  Temp: 98.6 F (37 C)  SpO2: 98%    Filed Weights   08/03/23 1144  Weight: 212 lb 8 oz (96.4 kg)    Physical Exam Constitutional:      Appearance: Normal appearance.  Cardiovascular:     Rate and Rhythm: Normal rate and regular rhythm.     Pulses: Normal pulses.     Heart sounds: Normal heart sounds.  Pulmonary:     Effort: Pulmonary effort is normal.     Breath sounds: Normal breath sounds.  Musculoskeletal:     Cervical back: Normal range of motion and neck supple. No rigidity.  Lymphadenopathy:     Cervical: No cervical adenopathy.  Neurological:     Mental Status: She is alert.      LABORATORY DATA:  I have reviewed the data as listed    Latest Ref Rng & Units 08/03/2023   11:17 AM 05/19/2023   11:47 AM 05/05/2023   10:42 AM  CBC  WBC 4.0 - 10.5 K/uL 7.7  7.2  6.7   Hemoglobin 12.0 - 15.0 g/dL 16.1  09.6  04.5   Hematocrit 36.0 - 46.0 % 39.0  41.9  42.0   Platelets 150 - 400 K/uL 272  264  273        Latest Ref Rng & Units 05/19/2023   11:47 AM 02/04/2023    2:27 PM 12/09/2022    9:31 AM  CMP  Glucose 70 - 99 mg/dL 161  93  096   BUN 8 - 23 mg/dL 13  15  20    Creatinine 0.44 - 1.00 mg/dL 0.45  4.09  8.11   Sodium 135 - 145 mmol/L 138  139  137   Potassium 3.5 - 5.1 mmol/L 3.2  3.5  3.4   Chloride 98 - 111 mmol/L 104  103  105   CO2 22 - 32 mmol/L 29  32  27   Calcium  8.9 - 10.3 mg/dL 9.6  91.4  9.6   Total Protein 6.5 - 8.1 g/dL 8.6  8.7  9.0   Total Bilirubin 0.0 - 1.2 mg/dL 0.3  0.4  0.3   Alkaline Phos 38 - 126 U/L 48  43  49   AST 15 - 41 U/L 11  13  13    ALT 0 - 44 U/L 11  12  13     RADIOGRAPHIC STUDIES: I have personally reviewed the radiological images as listed and agreed with the findings in the report. No results found.  ASSESSMENT & PLAN Denise Becker is a 73 y.o. who presents to the clinic for follow up of smoldering MM and right sided BC  #Smoldering MM --It appears she has smoldering multiple myeloma without any evidence of crab criteria.  Bone survey negative. --Bone marrow biopsy findings reviewed. CBC from today unremarkable. - Will await labs from today. Discussed potential future treatment options for MM if progression occurs. -Continue monitoring with quarterly labs.  # H/O Right breast cancer --Currently on tamoxifen  that she started on 03/01/2014 with plans to continue for 10 years.  -- Mammogram in Nov, no evidence of malignancy. --Monitor for any new symptoms or concerns.   No orders of the defined types were placed in this encounter.   All questions were answered. The patient knows to call the clinic with any  problems, questions or concerns.  I have spent a total of 30 minutes minutes of face-to-face and non-face-to-face time, preparing to see the patient, obtaining and/or reviewing separately obtained history, performing a medically appropriate examination, counseling and educating the patient, ordering tests/procedures, documenting clinical information in the electronic health record,  and care coordination.

## 2023-08-04 ENCOUNTER — Telehealth: Payer: Self-pay | Admitting: Hematology and Oncology

## 2023-08-04 LAB — IGG, IGA, IGM
IgA: 120 mg/dL (ref 64–422)
IgG (Immunoglobin G), Serum: 3488 mg/dL — ABNORMAL HIGH (ref 586–1602)
IgM (Immunoglobulin M), Srm: 52 mg/dL (ref 26–217)

## 2023-08-04 LAB — KAPPA/LAMBDA LIGHT CHAINS
Kappa free light chain: 12.7 mg/L (ref 3.3–19.4)
Kappa, lambda light chain ratio: 0.02 — ABNORMAL LOW (ref 0.26–1.65)
Lambda free light chains: 742.9 mg/L — ABNORMAL HIGH (ref 5.7–26.3)

## 2023-08-04 NOTE — Telephone Encounter (Signed)
 SPOKE WITH PATIENT ABOUT UPCOMING APPOINTMENT.

## 2023-08-10 DIAGNOSIS — H2513 Age-related nuclear cataract, bilateral: Secondary | ICD-10-CM | POA: Diagnosis not present

## 2023-08-10 DIAGNOSIS — H401113 Primary open-angle glaucoma, right eye, severe stage: Secondary | ICD-10-CM | POA: Diagnosis not present

## 2023-08-10 DIAGNOSIS — H40022 Open angle with borderline findings, high risk, left eye: Secondary | ICD-10-CM | POA: Diagnosis not present

## 2023-08-10 DIAGNOSIS — H40033 Anatomical narrow angle, bilateral: Secondary | ICD-10-CM | POA: Diagnosis not present

## 2023-08-13 LAB — IMMUNOFIXATION REFLEX, SERUM
IgA: 120 mg/dL (ref 64–422)
IgG (Immunoglobin G), Serum: 3674 mg/dL — ABNORMAL HIGH (ref 586–1602)
IgM (Immunoglobulin M), Srm: 59 mg/dL (ref 26–217)

## 2023-08-13 LAB — PROTEIN ELECTROPHORESIS, SERUM, WITH REFLEX
A/G Ratio: 0.7 (ref 0.7–1.7)
Albumin ELP: 3.2 g/dL (ref 2.9–4.4)
Alpha-1-Globulin: 0.3 g/dL (ref 0.0–0.4)
Alpha-2-Globulin: 0.8 g/dL (ref 0.4–1.0)
Beta Globulin: 1.1 g/dL (ref 0.7–1.3)
Gamma Globulin: 2.4 g/dL — ABNORMAL HIGH (ref 0.4–1.8)
Globulin, Total: 4.6 g/dL — ABNORMAL HIGH (ref 2.2–3.9)
M-Spike, %: 2 g/dL — ABNORMAL HIGH
SPEP Interpretation: 0
Total Protein ELP: 7.8 g/dL (ref 6.0–8.5)

## 2023-08-19 DIAGNOSIS — H401134 Primary open-angle glaucoma, bilateral, indeterminate stage: Secondary | ICD-10-CM | POA: Diagnosis not present

## 2023-08-19 DIAGNOSIS — R519 Headache, unspecified: Secondary | ICD-10-CM | POA: Diagnosis not present

## 2023-08-19 DIAGNOSIS — J0191 Acute recurrent sinusitis, unspecified: Secondary | ICD-10-CM | POA: Diagnosis not present

## 2023-09-30 DIAGNOSIS — H401113 Primary open-angle glaucoma, right eye, severe stage: Secondary | ICD-10-CM | POA: Diagnosis not present

## 2023-09-30 DIAGNOSIS — H40033 Anatomical narrow angle, bilateral: Secondary | ICD-10-CM | POA: Diagnosis not present

## 2023-09-30 DIAGNOSIS — H2513 Age-related nuclear cataract, bilateral: Secondary | ICD-10-CM | POA: Diagnosis not present

## 2023-09-30 DIAGNOSIS — H40022 Open angle with borderline findings, high risk, left eye: Secondary | ICD-10-CM | POA: Diagnosis not present

## 2023-10-20 ENCOUNTER — Telehealth: Payer: Self-pay | Admitting: Hematology and Oncology

## 2023-10-20 NOTE — Telephone Encounter (Signed)
 Left patient a vm regarding upcoming appointment

## 2023-10-28 DIAGNOSIS — H40033 Anatomical narrow angle, bilateral: Secondary | ICD-10-CM | POA: Diagnosis not present

## 2023-10-28 DIAGNOSIS — H401113 Primary open-angle glaucoma, right eye, severe stage: Secondary | ICD-10-CM | POA: Diagnosis not present

## 2023-10-28 DIAGNOSIS — H2513 Age-related nuclear cataract, bilateral: Secondary | ICD-10-CM | POA: Diagnosis not present

## 2023-10-28 DIAGNOSIS — H40022 Open angle with borderline findings, high risk, left eye: Secondary | ICD-10-CM | POA: Diagnosis not present

## 2023-11-05 ENCOUNTER — Inpatient Hospital Stay

## 2023-11-05 ENCOUNTER — Inpatient Hospital Stay: Admitting: Hematology and Oncology

## 2023-11-06 ENCOUNTER — Ambulatory Visit: Admitting: Hematology and Oncology

## 2023-11-06 ENCOUNTER — Other Ambulatory Visit

## 2023-12-04 ENCOUNTER — Inpatient Hospital Stay: Admitting: Hematology and Oncology

## 2023-12-04 ENCOUNTER — Inpatient Hospital Stay

## 2023-12-14 DIAGNOSIS — I480 Paroxysmal atrial fibrillation: Secondary | ICD-10-CM | POA: Diagnosis not present

## 2023-12-14 DIAGNOSIS — Z0000 Encounter for general adult medical examination without abnormal findings: Secondary | ICD-10-CM | POA: Diagnosis not present

## 2023-12-14 DIAGNOSIS — M85859 Other specified disorders of bone density and structure, unspecified thigh: Secondary | ICD-10-CM | POA: Diagnosis not present

## 2023-12-14 DIAGNOSIS — Z136 Encounter for screening for cardiovascular disorders: Secondary | ICD-10-CM | POA: Diagnosis not present

## 2023-12-14 DIAGNOSIS — Z853 Personal history of malignant neoplasm of breast: Secondary | ICD-10-CM | POA: Diagnosis not present

## 2023-12-14 DIAGNOSIS — H409 Unspecified glaucoma: Secondary | ICD-10-CM | POA: Diagnosis not present

## 2023-12-14 DIAGNOSIS — Z23 Encounter for immunization: Secondary | ICD-10-CM | POA: Diagnosis not present

## 2023-12-14 DIAGNOSIS — Z1331 Encounter for screening for depression: Secondary | ICD-10-CM | POA: Diagnosis not present

## 2023-12-14 DIAGNOSIS — I10 Essential (primary) hypertension: Secondary | ICD-10-CM | POA: Diagnosis not present

## 2023-12-15 ENCOUNTER — Inpatient Hospital Stay: Attending: Hematology and Oncology

## 2023-12-15 ENCOUNTER — Inpatient Hospital Stay: Admitting: Hematology and Oncology

## 2023-12-15 ENCOUNTER — Inpatient Hospital Stay

## 2023-12-15 VITALS — BP 156/58 | HR 57 | Temp 98.0°F | Resp 20 | Wt 212.6 lb

## 2023-12-15 DIAGNOSIS — Z87891 Personal history of nicotine dependence: Secondary | ICD-10-CM | POA: Diagnosis not present

## 2023-12-15 DIAGNOSIS — Z170 Estrogen receptor positive status [ER+]: Secondary | ICD-10-CM | POA: Insufficient documentation

## 2023-12-15 DIAGNOSIS — Z79810 Long term (current) use of selective estrogen receptor modulators (SERMs): Secondary | ICD-10-CM | POA: Insufficient documentation

## 2023-12-15 DIAGNOSIS — Z9011 Acquired absence of right breast and nipple: Secondary | ICD-10-CM | POA: Diagnosis not present

## 2023-12-15 DIAGNOSIS — D472 Monoclonal gammopathy: Secondary | ICD-10-CM

## 2023-12-15 DIAGNOSIS — C50911 Malignant neoplasm of unspecified site of right female breast: Secondary | ICD-10-CM | POA: Diagnosis not present

## 2023-12-15 LAB — CBC WITH DIFFERENTIAL/PLATELET
Abs Immature Granulocytes: 0.02 K/uL (ref 0.00–0.07)
Basophils Absolute: 0 K/uL (ref 0.0–0.1)
Basophils Relative: 1 %
Eosinophils Absolute: 0.4 K/uL (ref 0.0–0.5)
Eosinophils Relative: 5 %
HCT: 40.8 % (ref 36.0–46.0)
Hemoglobin: 13.7 g/dL (ref 12.0–15.0)
Immature Granulocytes: 0 %
Lymphocytes Relative: 31 %
Lymphs Abs: 2.4 K/uL (ref 0.7–4.0)
MCH: 27.8 pg (ref 26.0–34.0)
MCHC: 33.6 g/dL (ref 30.0–36.0)
MCV: 82.9 fL (ref 80.0–100.0)
Monocytes Absolute: 0.7 K/uL (ref 0.1–1.0)
Monocytes Relative: 10 %
Neutro Abs: 4 K/uL (ref 1.7–7.7)
Neutrophils Relative %: 53 %
Platelets: 236 K/uL (ref 150–400)
RBC: 4.92 MIL/uL (ref 3.87–5.11)
RDW: 15.2 % (ref 11.5–15.5)
WBC: 7.5 K/uL (ref 4.0–10.5)
nRBC: 0 % (ref 0.0–0.2)

## 2023-12-15 LAB — CMP (CANCER CENTER ONLY)
ALT: 11 U/L (ref 0–44)
AST: 12 U/L — ABNORMAL LOW (ref 15–41)
Albumin: 3.8 g/dL (ref 3.5–5.0)
Alkaline Phosphatase: 51 U/L (ref 38–126)
Anion gap: 5 (ref 5–15)
BUN: 15 mg/dL (ref 8–23)
CO2: 29 mmol/L (ref 22–32)
Calcium: 9.4 mg/dL (ref 8.9–10.3)
Chloride: 104 mmol/L (ref 98–111)
Creatinine: 0.9 mg/dL (ref 0.44–1.00)
GFR, Estimated: >60 mL/min (ref >60)
Glucose, Bld: 98 mg/dL (ref 70–99)
Potassium: 3.4 mmol/L — ABNORMAL LOW (ref 3.5–5.1)
Sodium: 138 mmol/L (ref 135–145)
Total Bilirubin: 0.3 mg/dL (ref 0.0–1.2)
Total Protein: 8.8 g/dL — ABNORMAL HIGH (ref 6.5–8.1)

## 2023-12-15 NOTE — Progress Notes (Signed)
 [facility] [phone]   [phone]  INITIAL CONSULT NOTE  Patient Care Team: Elliot Charm, MD as PCP - General (Internal Medicine) Michele Richardson, DO as PCP - Cardiology (Cardiology) Rogelio Planas, MD as Consulting Physician (Obstetrics and Gynecology)  Hematological/Oncological History #Right breast cancer -03/03/2013: Right breast biopsy: T3 N1, stage IIIA invasive ductal carcinoma, grade 3, estrogen receptor 100% positive, progesterone receptor 100% positive, with an MIB-1 of 20% and no HER-2 amplification -03/04/2014: Right axillary lymph node biopsy was negatibe -08/23/2013: Received 6 cycles of neoadjuvant chemotherapy with docetaxel , doxorubicin  and cyclophosphamide . -10/25/2013: Underwent right total mastectomy and right axillary sentinel node biopsy. Pathology revealed ypT3 ypN0 invasive lobular carcinoma, again estrogen and progesterone receptor positive and HER-2 nonamplified; with negative margins -12/08/2013-01/17/2014: Adjuvant radiation to right chest wall. 50.4 Gy in 28 fractions.  -03/01/2014: Started tamoxifen  with plans to continue for 10 years.   #Monoclonal gammopathy -03/13/2021: SPEP showed M protein measuring 2.1 g/dL, serum IgG measuring 6,557 mg/dL. Immunofixation showed IgG monocloncal protein with lamda light chain specificity. Lambda free light chains elevated at 641.1 with kappa/lamda ratio at 0.03.  -04/22/2021:Kappa free light chains elevated at 21.5 mg/L, Lambda free light chains elevated at 675.6 mg/L with kappa/lamda ratio at 0.03.  -05/21/2021: Establish care with [employer] PA-C --Bone survey done on May 24, 2021 showed no evidence of lytic lesions.  Moderate multifocal degenerative changes --Bone marrow aspiration and biopsy done on July 02, 2021 showed normocellular bone marrow for age with plasmacytosis plasma cells 13% plasma cells apparently display lambda light chain excess and very limited  staining most suggestive of plasma cell neoplasm especially in presence of monoclonal protein.  Cytogenetics normal, FISH normal No significant change in labs in December to consider progression.  CHIEF COMPLAINTS/PURPOSE OF CONSULTATION:  Monoclonal gammopathy  HISTORY OF PRESENTING ILLNESS:  Discussed the use of AI scribe software for clinical note transcription with the patient, who gave verbal consent to proceed.  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  She has not experienced any significant changes since her last visit. She continues her tamoxifen  regimen, with three months remaining in her ten-year course. No hospitalizations have been necessary. No fevers, drenching night sweats, changes in appetite, or significant weight loss.  She experiences difficulty sleeping, describing her sleep as not restful. Despite this, she does not wake up feeling tired.  She had a significant anxiety attack this morning while driving to the appointment, although such episodes are infrequent. She does not take medication for anxiety, managing it with deep breathing exercises. She feels a lack of control during these episodes.  No new bone pain, changes in breathing, bowel movements, or urination. She also denies any bleeding from any site.  She experiences occasional sharp pain in the area of her previous breast surgery. This pain can occur with movement or even while sitting still.  Rest of the pertinent 10 point ROS reviewed and neg.  MEDICAL HISTORY:  Past Medical History:  Diagnosis Date   Anxiety    anxiousabout impending surgery- loss of breast   Arthritis    joints hurt q now and then (10/25/2013)   Breast cancer (HCC)    Breast cancer of upper-outer quadrant of right female breast (HCC) 03/10/2013   Depression    history of depression after son died in the 2000's   GERD (gastroesophageal reflux  disease)    just chemo related  H/O hiatal hernia    Hypertension    Personal history of chemotherapy    Personal history of radiation therapy    Radiation 12/08/13-01/17/14   Right chest wall 50.4 Gy   Spontaneous dislocation of shoulder [phone]   right; a few times over the years    SURGICAL HISTORY: Past Surgical History:  Procedure Laterality Date   ABDOMINAL HYSTERECTOMY  1990's   BREAST BIOPSY Right 02/2013   CARDIOVERSION  11/26/2021   Procedure: CARDIOVERSION;  Surgeon: Alvan Ronal BRAVO, MD;  Location: [facility];  Service: Cardiovascular;;   CESAREAN SECTION  1980   MASTECTOMY COMPLETE / SIMPLE W/ SENTINEL NODE BIOPSY Right 10/25/2013   MASTECTOMY W/ SENTINEL NODE BIOPSY Right 10/25/2013   Procedure: RIGHT TOTAL MASTECTOMY WITH SENTINEL LYMPH NODE BIOPSY;  Surgeon: Elon CHRISTELLA Pacini, MD;  Location: [facility] OR;  Service: General;  Laterality: Right;   MULTIPLE TOOTH EXTRACTIONS Bilateral 2011-2014 X 6   PORT A CATH REVISION Right 04/29/2013   Procedure: PORT A CATH REVISION;  Surgeon: Elon CHRISTELLA Pacini, MD;  Location: [facility];  Service: General;  Laterality: Right;   PORT-A-CATH REMOVAL  10/25/2013   PORT-A-CATH REMOVAL N/A 10/25/2013   Procedure: REMOVAL PORT-A-CATH;  Surgeon: Elon CHRISTELLA Pacini, MD;  Location: [facility] OR;  Service: General;  Laterality: N/A;   PORTACATH PLACEMENT Right 04/15/2013   Procedure: INSERTION PORT-A-CATH;  Surgeon: Elon CHRISTELLA Pacini, MD;  Location: [facility];  Service: General;  Laterality: Right;   TEE WITHOUT CARDIOVERSION N/A 11/26/2021   Procedure: TRANSESOPHAGEAL ECHOCARDIOGRAM (TEE);  Surgeon: Alvan Ronal BRAVO, MD;  Location: [facility];  Service: Cardiovascular;  Laterality: N/A;   TONSILLECTOMY     WISDOM TOOTH EXTRACTION      SOCIAL HISTORY: Social History   Socioeconomic History   Marital status: Single    Spouse name: Not on file   Number of children: 1   Years of education: Not on file   Highest education  level: Not on file  Occupational History   Not on file  Tobacco Use   Smoking status: Former    Current packs/day: 0.00    Average packs/day: 0.3 packs/day for 3.0 years (0.8 ttl pk-yrs)    Types: Cigarettes    Start date: 04/12/1998    Quit date: 04/12/2001    Years since quitting: 22.6   Smokeless tobacco: Never   Tobacco comments:    casual smoker; pack would go stale on me  Vaping Use   Vaping status: Never Used  Substance and Sexual Activity   Alcohol use: Yes    Comment: 10/25/2013 might have a glass of wine a few times/year   Drug use: No   Sexual activity: Never    Birth control/protection: Surgical  Other Topics Concern   Not on file  Social History Narrative   Not on file   Social Drivers of Health   Financial Resource Strain: Not on file  Food Insecurity: Not on file  Transportation Needs: Not on file  Physical Activity: Not on file  Stress: Not on file  Social Connections: Not on file  Intimate Partner Violence: Not on file    FAMILY HISTORY: Family History  Problem Relation Age of Onset   Hypertension Mother    Bowel Disease Mother    Lung disease Father    Cancer Father    Breast cancer Maternal Aunt 83   Heart disease Maternal Uncle    Heart attack Maternal Uncle    Heart disease Maternal Grandmother  Heart disease Maternal Grandfather    Acute lymphoblastic leukemia Son     ALLERGIES:  is allergic to ciprofloxacin , effexor  [venlafaxine ], and penicillins.  MEDICATIONS:  Current Outpatient Medications  Medication Sig Dispense Refill   apixaban  (ELIQUIS ) 5 MG TABS tablet Take 1 tablet (5 mg total) by mouth 2 (two) times daily. 180 tablet 0   calcium -vitamin D  (OSCAL WITH D) 500-5 MG-MCG tablet Take 1 tablet by mouth daily with breakfast. 30 tablet 6   losartan -hydrochlorothiazide  (HYZAAR) 100-25 MG tablet Take 1 tablet by mouth daily.     LUMIGAN  0.01 % SOLN Place 1 drop into both eyes at bedtime.     metoprolol  succinate (TOPROL -XL) 50 MG  24 hr tablet Take 1 tablet (50 mg total) by mouth daily. Take with or immediately following a meal. HOLD if systolic blood pressure (top number) is less than 100 and/ or heart rate is less than 55. 30 tablet 5   Multiple Vitamins-Minerals (MULTIVITAMIN ADULT) CHEW Chew 2 tablets by mouth 2 (two) times a week.     niacin (,VITAMIN B3,) 500 MG tablet Take 500 mg by mouth at bedtime.     tamoxifen  (NOLVADEX ) 20 MG tablet Take 1 tablet (20 mg total) by mouth daily. 90 tablet 4   saccharomyces boulardii (FLORASTOR) 250 MG capsule Take 250 mg by mouth 2 (two) times daily. (Patient not taking: Reported on 12/15/2023)     No current facility-administered medications for this visit.    REVIEW OF SYSTEMS:   Constitutional: ( - ) fevers, ( - )  chills , ( - ) night sweats Eyes: ( - ) blurriness of vision, ( - ) double vision, ( - ) watery eyes Ears, nose, mouth, throat, and face: ( - ) mucositis, ( - ) sore throat Respiratory: ( - ) cough, ( - ) dyspnea, ( - ) wheezes Cardiovascular: ( - ) palpitation, ( - ) chest discomfort, ( - ) lower extremity swelling Gastrointestinal:  ( - ) nausea, ( - ) heartburn, ( - ) change in bowel habits Skin: ( - ) abnormal skin rashes Lymphatics: ( - ) new lymphadenopathy, ( - ) easy bruising Neurological: ( - ) numbness, ( - ) tingling, ( - ) new weaknesses Behavioral/Psych: ( - ) mood change, ( - ) new changes  All other systems were reviewed with the patient and are negative.  PHYSICAL EXAMINATION: ECOG PERFORMANCE STATUS: 1 - Symptomatic but completely ambulatory  Vitals:   12/15/23 1247  BP: (!) 160/68  Pulse: (!) 58  Resp: 20  Temp: 98 F (36.7 C)  SpO2: 98%    Filed Weights   12/15/23 1247  Weight: 212 lb 9.6 oz (96.4 kg)    Physical Exam Constitutional:      Appearance: Normal appearance.  Cardiovascular:     Rate and Rhythm: Normal rate and regular rhythm.     Pulses: Normal pulses.     Heart sounds: Normal heart sounds.  Pulmonary:      Effort: Pulmonary effort is normal.     Breath sounds: Normal breath sounds.  Musculoskeletal:     Cervical back: Normal range of motion and neck supple. No rigidity.  Lymphadenopathy:     Cervical: No cervical adenopathy.  Neurological:     Mental Status: She is alert.      LABORATORY DATA:  I have reviewed the data as listed    Latest Ref Rng & Units 12/15/2023   12:09 PM 08/03/2023   11:17 AM 05/19/2023  11:47 AM  CBC  WBC 4.0 - 10.5 K/uL 7.5  7.7  7.2   Hemoglobin 12.0 - 15.0 g/dL 86.2  86.8  86.2   Hematocrit 36.0 - 46.0 % 40.8  39.0  41.9   Platelets 150 - 400 K/uL 236  272  264        Latest Ref Rng & Units 08/03/2023   11:29 AM 05/19/2023   11:47 AM 02/04/2023    2:27 PM  CMP  Glucose 70 - 99 mg/dL 891  875  93   BUN 8 - 23 mg/dL 12  13  15    Creatinine 0.44 - 1.00 mg/dL 9.19  9.11  8.95   Sodium 135 - 145 mmol/L 136  138  139   Potassium 3.5 - 5.1 mmol/L 3.5  3.2  3.5   Chloride 98 - 111 mmol/L 104  104  103   CO2 22 - 32 mmol/L 29  29  32   Calcium  8.9 - 10.3 mg/dL 9.0  9.6  89.8   Total Protein 6.5 - 8.1 g/dL 8.4  8.6  8.7   Total Bilirubin 0.0 - 1.2 mg/dL 0.3  0.3  0.4   Alkaline Phos 38 - 126 U/L 49  48  43   AST 15 - 41 U/L 15  11  13    ALT 0 - 44 U/L 16  11  12     RADIOGRAPHIC STUDIES: I have personally reviewed the radiological images as listed and agreed with the findings in the report. No results found.  ASSESSMENT & PLAN BEYZA BELLINO is a 73 y.o. who presents to the clinic for follow up of smoldering MM and right sided BC  Assessment and Plan Assessment & Plan Breast cancer, status post mastectomy, on tamoxifen  Tamoxifen  therapy nearing completion with three months remaining. Chronic post-mastectomy pain and occasional anxiety attacks persist, but no new severe symptoms  Tamoxifen  reduces osteoporosis risk. - Continue tamoxifen  for three more months. - Order mammogram for November.  Chronic post-mastectomy pain, left chest/axilla Pain  likely due to nerve damage from surgery, sharp and intermittent with movement. - Refer to physical therapy at [facility] for breast cancer-specific exercises.  Smoldering myeloma (monoclonal gammopathy), under surveillance No new symptoms reported. - Monitor MM work up today  RTC in 3 months with labs. Labs after my visit.  No orders of the defined types were placed in this encounter.   All questions were answered. The patient knows to call the clinic with any problems, questions or concerns.  I have spent a total of 30 minutes minutes of face-to-face and non-face-to-face time, preparing to see the patient, obtaining and/or reviewing separately obtained history, performing a medically appropriate examination, counseling and educating the patient, ordering tests/procedures, documenting clinical information in the electronic health record,  and care coordination.

## 2023-12-16 ENCOUNTER — Other Ambulatory Visit (HOSPITAL_BASED_OUTPATIENT_CLINIC_OR_DEPARTMENT_OTHER): Payer: Self-pay | Admitting: Internal Medicine

## 2023-12-16 DIAGNOSIS — M85859 Other specified disorders of bone density and structure, unspecified thigh: Secondary | ICD-10-CM

## 2023-12-16 LAB — IGG, IGA, IGM
IgA: 122 mg/dL (ref 64–422)
IgG (Immunoglobin G), Serum: 3790 mg/dL — ABNORMAL HIGH (ref 586–1602)
IgM (Immunoglobulin M), Srm: 52 mg/dL (ref 26–217)

## 2023-12-16 LAB — KAPPA/LAMBDA LIGHT CHAINS
Kappa free light chain: 13.6 mg/L (ref 3.3–19.4)
Kappa, lambda light chain ratio: 0.02 — ABNORMAL LOW (ref 0.26–1.65)
Lambda free light chains: 606.9 mg/L — ABNORMAL HIGH (ref 5.7–26.3)

## 2023-12-23 LAB — PROTEIN ELECTROPHORESIS, SERUM, WITH REFLEX
A/G Ratio: 0.8 (ref 0.7–1.7)
Albumin ELP: 3.6 g/dL (ref 2.9–4.4)
Alpha-1-Globulin: 0.3 g/dL (ref 0.0–0.4)
Alpha-2-Globulin: 0.8 g/dL (ref 0.4–1.0)
Beta Globulin: 1 g/dL (ref 0.7–1.3)
Gamma Globulin: 2.7 g/dL — ABNORMAL HIGH (ref 0.4–1.8)
Globulin, Total: 4.8 g/dL — ABNORMAL HIGH (ref 2.2–3.9)
M-Spike, %: 2.5 g/dL — ABNORMAL HIGH
SPEP Interpretation: 0
Total Protein ELP: 8.4 g/dL (ref 6.0–8.5)

## 2023-12-23 LAB — IMMUNOFIXATION REFLEX, SERUM
IgA: 121 mg/dL (ref 64–422)
IgG (Immunoglobin G), Serum: 4123 mg/dL — ABNORMAL HIGH (ref 586–1602)
IgM (Immunoglobulin M), Srm: 62 mg/dL (ref 26–217)

## 2024-01-15 ENCOUNTER — Ambulatory Visit: Attending: Cardiology | Admitting: Cardiology

## 2024-01-15 ENCOUNTER — Encounter: Payer: Self-pay | Admitting: Cardiology

## 2024-01-15 VITALS — BP 128/88 | HR 78 | Ht 61.0 in | Wt 210.2 lb

## 2024-01-15 DIAGNOSIS — I480 Paroxysmal atrial fibrillation: Secondary | ICD-10-CM | POA: Diagnosis not present

## 2024-01-15 DIAGNOSIS — I4819 Other persistent atrial fibrillation: Secondary | ICD-10-CM | POA: Diagnosis not present

## 2024-01-15 DIAGNOSIS — I10 Essential (primary) hypertension: Secondary | ICD-10-CM

## 2024-01-15 DIAGNOSIS — D472 Monoclonal gammopathy: Secondary | ICD-10-CM

## 2024-01-15 DIAGNOSIS — Z7901 Long term (current) use of anticoagulants: Secondary | ICD-10-CM | POA: Diagnosis not present

## 2024-01-15 MED ORDER — APIXABAN 5 MG PO TABS: 5.0000 mg | ORAL_TABLET | Freq: Two times a day (BID) | ORAL | 3 refills | Status: DC

## 2024-01-15 MED ORDER — METOPROLOL TARTRATE 25 MG PO TABS: 25.0000 mg | ORAL_TABLET | Freq: Two times a day (BID) | ORAL | 3 refills | Status: AC

## 2024-01-15 MED ORDER — APIXABAN 5 MG PO TABS: 5.0000 mg | ORAL_TABLET | Freq: Two times a day (BID) | ORAL | 3 refills | Status: AC

## 2024-01-15 MED ORDER — METOPROLOL TARTRATE 25 MG PO TABS: 25.0000 mg | ORAL_TABLET | Freq: Two times a day (BID) | ORAL | 3 refills | Status: DC

## 2024-01-15 NOTE — Progress Notes (Unsigned)
 Cardiology Office Note:  .   Date:  01/15/2024  ID:  Denise Becker, DOB [birth year], MRN [id number] PCP:  Denise Charm, MD  Former Cardiology Providers: None  [facility] Providers Cardiologist:  Madonna Large, DO , [facility] (established care 09/23/2019) Electrophysiologist:  None  Click to update primary MD,subspecialty MD or APP then REFRESH:1}    Chief Complaint  Patient presents with   Follow-up    Afib follow up     History of Present Illness: .   Denise Becker is a 73 y.o. African-American female whose past medical history and cardiovascular risk factors includes: Persistent atrial fibrillation, history of breast cancer status post mastectomy/radiation/chemotherapy, Smoldering Multiple Myeloma, hypertension, postmenopausal female, advanced age, obesity due to excess calories.   Patient being followed in the practice for paroxysmal atrial fibrillation.  In August 2023 she was diagnosed with bacteremia and was found to be in A-fib with RVR.  Despite parenteral medication she needed TEE guided cardioversion to restore sinus rhythm.  She has been placed on AV nodal blocking agents for rate control strategy and thromboembolic prophylaxis given her CHA2DS2-VASc score.  Given the fact that her A-fib episode was in the setting of bacteremia we had discussed in the past using implantable loop recorder to monitor for A-fib burden and if not present in the first 30 days of monitoring could discontinue anticoagulation and monitor A-fib burden longitudinally with ILR.  However patient favored to continue with anticoagulation given her CHA2DS2-VASc score and not to consider implantable loop recorder despite educating her regarding risks of anticoagulation.  Patient presents today for follow-up. She denies anginal chest pain or heart failure symptoms. Since last office visit patient states that she is doing well; however, did not start taking her Toprol -XL and prefers to be  on Lopressor . She also stopped anticoagulation for the last 2 to 3 months, no particular reason.  Likely secondary to bruising bilateral shins.   Review of Systems: .   Review of Systems  Cardiovascular:  Negative for chest pain, claudication, irregular heartbeat, leg swelling, near-syncope, orthopnea, palpitations, paroxysmal nocturnal dyspnea and syncope.  Respiratory:  Negative for shortness of breath.   Hematologic/Lymphatic: Negative for bleeding problem.    Studies Reviewed:    Echocardiogram: 03/29/2013: LVEF 55-60%, normal wall motion, no regional wall motion abnormalities, average global longitudinal strain -18.6%.     09/29/2019: LVEF 55%, Mild LVH, Grade II diastolic dysfunction, mildly LAE.    06/19/2022:  1. Left ventricular ejection fraction, by estimation, is 55 to 60%. The left ventricle has normal function. The left ventricle has no regional wall motion abnormalities. There is mild left ventricular hypertrophy.  Left ventricular diastolic parameters were normal. The average left ventricular global longitudinal strain is -10.6 %. The global longitudinal strain is abnormal.   2. Right ventricular systolic function is normal. The right ventricular  size is normal.   3. The mitral valve is normal in structure. No evidence of mitral valve regurgitation. No evidence of mitral stenosis.   4. The aortic valve was not well visualized. Aortic valve regurgitation is not visualized. No aortic stenosis is present.   5. Rhythm strip during this exam demonstrates normal sinus rhythm.  Comparison(s): Compared to transesophageal echocardiogram 11/26/2021 LVEF remains preserved otherwise no significant change.   RADIOLOGY: NA  Risk Assessment/Calculations:   Click Here to Calculate/Change CHADS2VASc Score The patient's CHADS2-VASc score is 3, indicating a 3.2% annual risk of stroke.   CHF History: No HTN History: Yes Diabetes History: No Stroke  History: No Vascular Disease History:  No  Labs:       Latest Ref Rng & Units 12/15/2023   12:09 PM 08/03/2023   11:17 AM 05/19/2023   11:47 AM  CBC  WBC 4.0 - 10.5 K/uL 7.5  7.7  7.2   Hemoglobin 12.0 - 15.0 g/dL 86.2  86.8  86.2   Hematocrit 36.0 - 46.0 % 40.8  39.0  41.9   Platelets 150 - 400 K/uL 236  272  264        Latest Ref Rng & Units 12/15/2023    1:37 PM 08/03/2023   11:29 AM 05/19/2023   11:47 AM  BMP  Glucose 70 - 99 mg/dL 98  891  875   BUN 8 - 23 mg/dL 15  12  13    Creatinine 0.44 - 1.00 mg/dL 9.09  9.19  9.11   Sodium 135 - 145 mmol/L 138  136  138   Potassium 3.5 - 5.1 mmol/L 3.4  3.5  3.2   Chloride 98 - 111 mmol/L 104  104  104   CO2 22 - 32 mmol/L 29  29  29    Calcium  8.9 - 10.3 mg/dL 9.4  9.0  9.6       Latest Ref Rng & Units 12/15/2023    1:37 PM 08/03/2023   11:29 AM 05/19/2023   11:47 AM  CMP  Glucose 70 - 99 mg/dL 98  891  875   BUN 8 - 23 mg/dL 15  12  13    Creatinine 0.44 - 1.00 mg/dL 9.09  9.19  9.11   Sodium 135 - 145 mmol/L 138  136  138   Potassium 3.5 - 5.1 mmol/L 3.4  3.5  3.2   Chloride 98 - 111 mmol/L 104  104  104   CO2 22 - 32 mmol/L 29  29  29    Calcium  8.9 - 10.3 mg/dL 9.4  9.0  9.6   Total Protein 6.5 - 8.1 g/dL 8.8  8.4  8.6   Total Bilirubin 0.0 - 1.2 mg/dL 0.3  0.3  0.3   Alkaline Phos 38 - 126 U/L 51  49  48   AST 15 - 41 U/L 12  15  11    ALT 0 - 44 U/L 11  16  11      Lab Results  Component Value Date   CHOL 182 03/17/2014   HDL 56 03/02/2013   No results for input(s): LIPOA in the last 8760 hours. No components found for: NTPROBNP No results for input(s): PROBNP in the last 8760 hours. No results for input(s): TSH in the last 8760 hours.  Physical Exam:    Today's Vitals   01/15/24 1332  BP: 128/88  Pulse: 78  SpO2: 97%  Weight: 210 lb 3.2 oz (95.3 kg)  Height: 5' 1 (1.549 m)    Body mass index is 39.72 kg/m. Wt Readings from Last 3 Encounters:  01/15/24 210 lb 3.2 oz (95.3 kg)  12/15/23 212 lb 9.6 oz (96.4 kg)  08/03/23 212 lb 8 oz  (96.4 kg)    Physical Exam  Constitutional: No distress.  Age appropriate, hemodynamically stable, ambulates with a walker.  Neck: No JVD present.  Cardiovascular: Normal rate, regular rhythm, S1 normal, S2 normal, intact distal pulses and normal pulses. Exam reveals no gallop, no S3 and no S4.  No murmur heard. Pulmonary/Chest: Effort normal and breath sounds normal. No stridor. She has no wheezes. She has no rales.  History of  right mastectomy  Abdominal: Soft. Bowel sounds are normal. She exhibits no distension. There is no abdominal tenderness.  Musculoskeletal:        General: No edema.     Cervical back: Neck supple.  Neurological: She is alert and oriented to person, place, and time. She has intact cranial nerves (2-12).  Skin: Skin is warm and moist.     Impression & Recommendation(s):  Impression:   ICD-10-CM   1. Persistent atrial fibrillation (HCC)  I48.19 Ambulatory referral to Cardiac Electrophysiology    2. Long term (current) use of anticoagulants  Z79.01     3. Essential hypertension, benign  I10     4. Smoldering multiple myeloma  D47.2         Recommendation(s):  Persistent atrial fibrillation (HCC) Rate control: Metoprolol . Rhythm control: N/A. Thromboembolic prophylaxis: Eliquis  History of TEE guided cardioversion We have discussed the role of the loop recorder to monitor A-fib burden longitudinally and if no A-fib is detected in the first 30 days could come off Eliquis .  However patient has been reluctant with ILR implant in the past. Preferred to be on oral anticoagulation.  However, over the last 2-3 months she stopped taking anticoagulation as per her preference. Discussed pathophysiology of atrial fibrillation and its associated comorbidities. Patient is willing to be referred to EP for loop recorder implant. Will start Eliquis  for now. Once the loop recorder is in place and I recommended monitoring her for the initial first 30 days.  If she does  not have A-fib in the first 30 days then Eliquis  can be discontinued safely with long-term monitoring with a loop recorder implant.  Patient understands that if the loop recorder detects A-fib she will need to be placed on anticoagulation for thromboembolic prophylaxis. Also discussed with her that there is a fee for monitoring loop recorder longitudinally.   Long term (current) use of anticoagulants Restart Eliquis  5 mg p.o. twice daily Most recent labs from September 2025 independently reviewed-hemoglobin and renal function at baseline Risks, benefits, and alternatives to anticoagulation discussed  Essential hypertension, benign Office blood pressures are well-controlled. Continue losartan /hydrochlorothiazide  100/25 mg p.o. daily. Will transition her from Toprol -XL to Lopressor , as per her request  Orders Placed:  Orders Placed This Encounter  Procedures   Ambulatory referral to Cardiac Electrophysiology    Referral Priority:   Routine    Referral Type:   Consultation    Referral Reason:   Specialty Services Required    Requested Specialty:   Cardiology    Number of Visits Requested:   1     Final Medication List:    Meds ordered this encounter  Medications   apixaban  (ELIQUIS ) 5 MG TABS tablet    Sig: Take 1 tablet (5 mg total) by mouth 2 (two) times daily.    Dispense:  180 tablet    Refill:  3   DISCONTD: metoprolol  tartrate (LOPRESSOR ) 25 MG tablet    Sig: Take 1 tablet (25 mg total) by mouth 2 (two) times daily.    Dispense:  180 tablet    Refill:  3    Medications Discontinued During This Encounter  Medication Reason   metoprolol  succinate (TOPROL -XL) 50 MG 24 hr tablet Dose change   metoprolol  tartrate (LOPRESSOR ) 25 MG tablet Duplicate      Current Outpatient Medications:    apixaban  (ELIQUIS ) 5 MG TABS tablet, Take 1 tablet (5 mg total) by mouth 2 (two) times daily., Disp: 180 tablet, Rfl: 0   apixaban  (ELIQUIS )  5 MG TABS tablet, Take 1 tablet (5 mg total)  by mouth 2 (two) times daily., Disp: 180 tablet, Rfl: 3   calcium -vitamin D  (OSCAL WITH D) 500-5 MG-MCG tablet, Take 1 tablet by mouth daily with breakfast., Disp: 30 tablet, Rfl: 6   latanoprost (XALATAN) 0.005 % ophthalmic solution, SMARTSIG:In Eye(s), Disp: , Rfl:    losartan -hydrochlorothiazide  (HYZAAR) 100-25 MG tablet, Take 1 tablet by mouth daily., Disp: , Rfl:    LUMIGAN  0.01 % SOLN, Place 1 drop into both eyes at bedtime., Disp: , Rfl:    metoprolol  tartrate (LOPRESSOR ) 25 MG tablet, Take 25 mg by mouth 2 (two) times daily., Disp: , Rfl:    Multiple Vitamins-Minerals (MULTIVITAMIN ADULT) CHEW, Chew 2 tablets by mouth 2 (two) times a week., Disp: , Rfl:    niacin (,VITAMIN B3,) 500 MG tablet, Take 500 mg by mouth at bedtime., Disp: , Rfl:    tamoxifen  (NOLVADEX ) 20 MG tablet, Take 1 tablet (20 mg total) by mouth daily., Disp: 90 tablet, Rfl: 4   saccharomyces boulardii (FLORASTOR) 250 MG capsule, Take 250 mg by mouth 2 (two) times daily. (Patient not taking: Reported on 01/15/2024), Disp: , Rfl:   Consent:   NA  Disposition:   1 year follow-up sooner if needed  Her questions and concerns were addressed to her satisfaction. She voices understanding of the recommendations provided during this encounter.    Signed, Madonna Michele HAS, [facility]  A Division of [facility] [address], [place], [place] [address]   01/15/2024 2:04 PM

## 2024-01-15 NOTE — Patient Instructions (Signed)
 Medication Instructions:  Start Eliquis  5 mg twice a day Stop Toprol  XL Start Metoprolol  Tartrate 25 mg twice a day  Continue all other medications  No Aspirin   Take Tylenol  if needed *If you need a refill on your cardiac medications before your next appointment, please call your pharmacy*  Lab Work: None ordered  Testing/Procedures: None ordered  Follow-Up: At [facility], you and your health needs are our priority.  As part of our continuing mission to provide you with exceptional heart care, our providers are all part of one team.  This team includes your primary Cardiologist (physician) and Advanced Practice Providers or APPs (Physician Assistants and Nurse Practitioners) who all work together to provide you with the care you need, when you need it.  Your next appointment:  1 year   Call in May to schedule Oct appointment     Provider:  Dr.[name]        Appointment to be scheduled with EP for [employer]    We recommend signing up for the patient portal called MyChart.  Sign up information is provided on this After Visit Summary.  MyChart is used to connect with patients for Virtual Visits (Telemedicine).  Patients are able to view lab/test results, encounter notes, upcoming appointments, etc.  Non-urgent messages can be sent to your provider as well.   To learn more about what you can do with MyChart, go to [email or web address].

## 2024-01-20 DIAGNOSIS — E669 Obesity, unspecified: Secondary | ICD-10-CM | POA: Diagnosis not present

## 2024-01-20 DIAGNOSIS — Z23 Encounter for immunization: Secondary | ICD-10-CM | POA: Diagnosis not present

## 2024-01-20 DIAGNOSIS — F411 Generalized anxiety disorder: Secondary | ICD-10-CM | POA: Diagnosis not present

## 2024-02-24 ENCOUNTER — Ambulatory Visit
Admission: RE | Admit: 2024-02-24 | Discharge: 2024-02-24 | Disposition: A | Source: Ambulatory Visit | Attending: Hematology and Oncology

## 2024-02-24 DIAGNOSIS — D472 Monoclonal gammopathy: Secondary | ICD-10-CM

## 2024-02-24 DIAGNOSIS — Z1231 Encounter for screening mammogram for malignant neoplasm of breast: Secondary | ICD-10-CM | POA: Diagnosis not present
